# Patient Record
Sex: Male | Born: 1960 | Race: Black or African American | Hispanic: No | Marital: Single | Smoking: Current some day smoker
Health system: Southern US, Community
[De-identification: ages and names within clinical notes are randomized; demographics above are authoritative.]

## PROBLEM LIST (undated history)

## (undated) DIAGNOSIS — F250 Schizoaffective disorder, bipolar type: Secondary | ICD-10-CM

## (undated) DIAGNOSIS — F209 Schizophrenia, unspecified: Secondary | ICD-10-CM

## (undated) DIAGNOSIS — I10 Essential (primary) hypertension: Secondary | ICD-10-CM

## (undated) HISTORY — PX: OTHER SURGICAL HISTORY: SHX169

---

## 2010-06-21 ENCOUNTER — Emergency Department: Payer: Self-pay | Admitting: Emergency Medicine

## 2010-07-15 ENCOUNTER — Emergency Department: Payer: Self-pay | Admitting: *Deleted

## 2011-05-31 ENCOUNTER — Emergency Department: Payer: Self-pay | Admitting: Emergency Medicine

## 2011-05-31 LAB — DRUG SCREEN, URINE
Benzodiazepine, Ur Scrn: NEGATIVE (ref ?–200)
Cocaine Metabolite,Ur ~~LOC~~: NEGATIVE (ref ?–300)
Methadone, Ur Screen: NEGATIVE (ref ?–300)
Opiate, Ur Screen: NEGATIVE (ref ?–300)
Phencyclidine (PCP) Ur S: NEGATIVE (ref ?–25)
Tricyclic, Ur Screen: NEGATIVE (ref ?–1000)

## 2011-05-31 LAB — COMPREHENSIVE METABOLIC PANEL
Alkaline Phosphatase: 100 U/L (ref 50–136)
Anion Gap: 7 (ref 7–16)
BUN: 16 mg/dL (ref 7–18)
Bilirubin,Total: 0.2 mg/dL (ref 0.2–1.0)
Calcium, Total: 8.6 mg/dL (ref 8.5–10.1)
Chloride: 110 mmol/L — ABNORMAL HIGH (ref 98–107)
Creatinine: 1.08 mg/dL (ref 0.60–1.30)
EGFR (African American): >60
Potassium: 3.6 mmol/L (ref 3.5–5.1)
SGPT (ALT): 14 U/L
Sodium: 143 mmol/L (ref 136–145)
Total Protein: 7.8 g/dL (ref 6.4–8.2)

## 2011-05-31 LAB — CBC
HCT: 38.5 % — ABNORMAL LOW (ref 40.0–52.0)
MCH: 34.3 pg — ABNORMAL HIGH (ref 26.0–34.0)
MCHC: 34 g/dL (ref 32.0–36.0)
Platelet: 231 10*3/uL (ref 150–440)
RBC: 3.82 10*6/uL — ABNORMAL LOW (ref 4.40–5.90)
RDW: 13.2 % (ref 11.5–14.5)
WBC: 8.3 10*3/uL (ref 3.8–10.6)

## 2011-05-31 LAB — ETHANOL
Ethanol %: <0.003 % (ref 0.000–0.080)
Ethanol: <3 mg/dL

## 2011-05-31 LAB — ACETAMINOPHEN LEVEL: Acetaminophen: <2 ug/mL

## 2011-08-04 ENCOUNTER — Emergency Department: Payer: Self-pay | Admitting: Emergency Medicine

## 2011-08-04 LAB — DRUG SCREEN, URINE
Barbiturates, Ur Screen: NEGATIVE (ref ?–200)
Cocaine Metabolite,Ur ~~LOC~~: NEGATIVE (ref ?–300)
MDMA (Ecstasy)Ur Screen: NEGATIVE (ref ?–500)
Opiate, Ur Screen: NEGATIVE (ref ?–300)
Phencyclidine (PCP) Ur S: NEGATIVE (ref ?–25)
Tricyclic, Ur Screen: NEGATIVE (ref ?–1000)

## 2011-08-04 LAB — COMPREHENSIVE METABOLIC PANEL
Albumin: 3.9 g/dL (ref 3.4–5.0)
Alkaline Phosphatase: 121 U/L (ref 50–136)
BUN: 14 mg/dL (ref 7–18)
Bilirubin,Total: 0.3 mg/dL (ref 0.2–1.0)
Creatinine: 0.94 mg/dL (ref 0.60–1.30)
EGFR (African American): >60
Glucose: 79 mg/dL (ref 65–99)
SGOT(AST): 26 U/L (ref 15–37)
SGPT (ALT): 17 U/L
Total Protein: 8.2 g/dL (ref 6.4–8.2)

## 2011-08-04 LAB — CBC
MCHC: 34.6 g/dL (ref 32.0–36.0)
MCV: 100 fL (ref 80–100)
Platelet: 196 10*3/uL (ref 150–440)
RBC: 4.14 10*6/uL — ABNORMAL LOW (ref 4.40–5.90)
RDW: 13.2 % (ref 11.5–14.5)
WBC: 7.1 10*3/uL (ref 3.8–10.6)

## 2011-08-04 LAB — URINALYSIS, COMPLETE
Bacteria: NONE SEEN
Bilirubin,UR: NEGATIVE
Blood: NEGATIVE
Nitrite: NEGATIVE
RBC,UR: 1 /HPF (ref 0–5)
Specific Gravity: 1.028 (ref 1.003–1.030)
WBC UR: 5 /HPF (ref 0–5)

## 2011-08-04 LAB — ETHANOL
Ethanol %: <0.003 % (ref 0.000–0.080)
Ethanol: <3 mg/dL

## 2011-10-29 ENCOUNTER — Inpatient Hospital Stay: Payer: Self-pay | Admitting: Psychiatry

## 2011-10-29 LAB — COMPREHENSIVE METABOLIC PANEL
Albumin: 4.2 g/dL (ref 3.4–5.0)
Alkaline Phosphatase: 108 U/L (ref 50–136)
Bilirubin,Total: 0.5 mg/dL (ref 0.2–1.0)
Calcium, Total: 8.8 mg/dL (ref 8.5–10.1)
Creatinine: 0.89 mg/dL (ref 0.60–1.30)
EGFR (African American): >60
EGFR (Non-African Amer.): >60
Glucose: 81 mg/dL (ref 65–99)
Osmolality: 277 (ref 275–301)
Potassium: 3.5 mmol/L (ref 3.5–5.1)
SGPT (ALT): 20 U/L (ref 12–78)
Sodium: 139 mmol/L (ref 136–145)

## 2011-10-29 LAB — URINALYSIS, COMPLETE
Bacteria: NONE SEEN
Bilirubin,UR: NEGATIVE
Glucose,UR: NEGATIVE mg/dL (ref 0–75)
Ph: 5 (ref 4.5–8.0)
Protein: NEGATIVE
RBC,UR: 1 /HPF (ref 0–5)
Specific Gravity: 1.028 (ref 1.003–1.030)
Squamous Epithelial: 2
WBC UR: 5 /HPF (ref 0–5)

## 2011-10-29 LAB — CBC
HCT: 42.1 % (ref 40.0–52.0)
HGB: 14.2 g/dL (ref 13.0–18.0)
MCH: 34.2 pg — ABNORMAL HIGH (ref 26.0–34.0)
MCHC: 33.8 g/dL (ref 32.0–36.0)
MCV: 101 fL — ABNORMAL HIGH (ref 80–100)
Platelet: 276 10*3/uL (ref 150–440)
RDW: 13.5 % (ref 11.5–14.5)
WBC: 5.9 10*3/uL (ref 3.8–10.6)

## 2011-10-29 LAB — DRUG SCREEN, URINE
Amphetamines, Ur Screen: NEGATIVE (ref ?–1000)
Barbiturates, Ur Screen: NEGATIVE (ref ?–200)
Cannabinoid 50 Ng, Ur ~~LOC~~: NEGATIVE (ref ?–50)
MDMA (Ecstasy)Ur Screen: NEGATIVE (ref ?–500)
Methadone, Ur Screen: NEGATIVE (ref ?–300)
Opiate, Ur Screen: NEGATIVE (ref ?–300)
Phencyclidine (PCP) Ur S: NEGATIVE (ref ?–25)

## 2011-10-29 LAB — ACETAMINOPHEN LEVEL: Acetaminophen: <2 ug/mL

## 2011-10-29 LAB — TSH: Thyroid Stimulating Horm: 0.68 u[IU]/mL

## 2011-10-30 LAB — LIPID PANEL
Cholesterol: 148 mg/dL (ref 0–200)
Ldl Cholesterol, Calc: 79 mg/dL (ref 0–100)
Triglycerides: 54 mg/dL (ref 0–200)
VLDL Cholesterol, Calc: 11 mg/dL (ref 5–40)

## 2011-11-03 LAB — COMPREHENSIVE METABOLIC PANEL
Albumin: 3.7 g/dL (ref 3.4–5.0)
Anion Gap: 8 (ref 7–16)
BUN: 12 mg/dL (ref 7–18)
Bilirubin,Total: 0.5 mg/dL (ref 0.2–1.0)
Chloride: 107 mmol/L (ref 98–107)
Creatinine: 0.95 mg/dL (ref 0.60–1.30)
EGFR (African American): >60
Glucose: 120 mg/dL — ABNORMAL HIGH (ref 65–99)
Osmolality: 286 (ref 275–301)
Potassium: 4 mmol/L (ref 3.5–5.1)
SGOT(AST): 28 U/L (ref 15–37)
Sodium: 143 mmol/L (ref 136–145)
Total Protein: 7.4 g/dL (ref 6.4–8.2)

## 2011-11-07 LAB — COMPREHENSIVE METABOLIC PANEL
Alkaline Phosphatase: 92 U/L (ref 50–136)
BUN: 10 mg/dL (ref 7–18)
Bilirubin,Total: 0.4 mg/dL (ref 0.2–1.0)
Chloride: 108 mmol/L — ABNORMAL HIGH (ref 98–107)
Creatinine: 1.03 mg/dL (ref 0.60–1.30)
EGFR (African American): >60
EGFR (Non-African Amer.): >60
Glucose: 71 mg/dL (ref 65–99)
Osmolality: 284 (ref 275–301)
SGPT (ALT): 17 U/L (ref 12–78)
Sodium: 144 mmol/L (ref 136–145)
Total Protein: 7 g/dL (ref 6.4–8.2)

## 2011-11-07 LAB — VALPROIC ACID LEVEL: Valproic Acid: 20 ug/mL — ABNORMAL LOW

## 2012-05-09 ENCOUNTER — Emergency Department: Payer: Self-pay | Admitting: Emergency Medicine

## 2012-05-15 ENCOUNTER — Emergency Department: Payer: Self-pay | Admitting: Emergency Medicine

## 2012-06-20 ENCOUNTER — Emergency Department: Payer: Self-pay | Admitting: Emergency Medicine

## 2013-04-05 LAB — URINALYSIS, COMPLETE
BACTERIA: NONE SEEN
BILIRUBIN, UR: NEGATIVE
BLOOD: NEGATIVE
Glucose,UR: NEGATIVE mg/dL (ref 0–75)
Nitrite: NEGATIVE
Ph: 6 (ref 4.5–8.0)
Protein: NEGATIVE
Specific Gravity: 1.026 (ref 1.003–1.030)
Squamous Epithelial: 1
WBC UR: 14 /HPF (ref 0–5)

## 2013-04-05 LAB — CBC
HCT: 43.7 % (ref 40.0–52.0)
HGB: 14.1 g/dL (ref 13.0–18.0)
MCH: 32.3 pg (ref 26.0–34.0)
MCHC: 32.2 g/dL (ref 32.0–36.0)
MCV: 100 fL (ref 80–100)
Platelet: 229 10*3/uL (ref 150–440)
RBC: 4.37 10*6/uL — AB (ref 4.40–5.90)
RDW: 13.4 % (ref 11.5–14.5)
WBC: 7.1 10*3/uL (ref 3.8–10.6)

## 2013-04-05 LAB — DRUG SCREEN, URINE

## 2013-04-05 LAB — COMPREHENSIVE METABOLIC PANEL
ALBUMIN: 3.8 g/dL (ref 3.4–5.0)
ALK PHOS: 80 U/L
AST: 17 U/L (ref 15–37)
Anion Gap: 6 — ABNORMAL LOW (ref 7–16)
BILIRUBIN TOTAL: 0.3 mg/dL (ref 0.2–1.0)
BUN: 15 mg/dL (ref 7–18)
CO2: 27 mmol/L (ref 21–32)
CREATININE: 1.09 mg/dL (ref 0.60–1.30)
Calcium, Total: 9.2 mg/dL (ref 8.5–10.1)
Chloride: 106 mmol/L (ref 98–107)
EGFR (African American): >60
EGFR (Non-African Amer.): >60
Glucose: 64 mg/dL — ABNORMAL LOW (ref 65–99)
OSMOLALITY: 276 (ref 275–301)
POTASSIUM: 4 mmol/L (ref 3.5–5.1)
SGPT (ALT): 15 U/L (ref 12–78)
Sodium: 139 mmol/L (ref 136–145)
Total Protein: 8.6 g/dL — ABNORMAL HIGH (ref 6.4–8.2)

## 2013-04-05 LAB — SALICYLATE LEVEL: Salicylates, Serum: 2.7 mg/dL

## 2013-04-05 LAB — ACETAMINOPHEN LEVEL

## 2013-04-05 LAB — VALPROIC ACID LEVEL: Valproic Acid: 33 ug/mL — ABNORMAL LOW

## 2013-04-05 LAB — ETHANOL: Ethanol %: <0.003 % (ref 0.000–0.080)

## 2013-04-06 ENCOUNTER — Inpatient Hospital Stay: Payer: Self-pay | Admitting: Psychiatry

## 2013-04-06 LAB — TSH: Thyroid Stimulating Horm: 0.379 u[IU]/mL — ABNORMAL LOW

## 2014-01-11 DIAGNOSIS — F209 Schizophrenia, unspecified: Secondary | ICD-10-CM | POA: Diagnosis not present
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
## 2014-04-29 NOTE — Consult Note (Signed)
Brief Consult Note: Diagnosis: Schizoaffective disorder, bipolar type.   Patient was seen by consultant.   Recommend further assessment or treatment.   Orders entered.   Comments: Chronic spmi, recently went from group home to apartment. Now w/ disruptive and agressive behaviors.Sent over from [facility] where he was seen by this Clinical research associatewriter, on IVC. Hx of > 50 psych hosps since age 54. Poor med compliance suspected. I will provide med orders, labs and follow up tomorrow, provide psych services until he is admitted..  Electronic Signatures: Corinna LinesLavine, Philip H (MD)  (Signed 31-Mar-15 17:19)  Authored: Brief Consult Note   Last Updated: 31-Mar-15 17:19 by Corinna LinesLavine, Philip H (MD)

## 2014-04-29 NOTE — H&P (Signed)
PATIENT NAME:  [name], CIFELLI MR#:  [id number] DATE OF BIRTH:  07/17/1960  DATE OF ADMISSION:  04/06/2013  IDENTIFYING INFORMATION AND CHIEF COMPLAINT: A 54 year old man with a history of schizoaffective disorder, who is sent here from [facility] because of agitated behavior. The patient's chief complaint "I'd like to know when I can be discharged."   HISTORY OF PRESENT ILLNESS: Information obtained from the patient and the chart and from a brief discussion with the psychiatrist who referred the patient here. The patient evidently was confronted by the supervisor at his apartment complex over the fact that he has been playing music loudly at night, pacing around, being loud and disruptive of other residents. Allegedly, the patient became agitated and was yelling this apartment supervisor. The ACT team was notified and brought the patient to [facility] where it sounds like he probably escalated more and was agitated. The patient himself does not deny this, but he tends to minimize the tone of it. He says that he does admit that he is frequently awake at 2:00 or 3:00 in the morning and that sometimes he does play music loudly and pace around. He says he has no wish to disrupt anyone else and admits that they had confronted him about it at least once previously as well. He denies that he was hostile but does admit raising his voice to the person at the apartment. The patient denies that his mood has been abnormally high. He denies any hallucinations. Denies any homicidal ideation. Denies that he has been feeling paranoid. He claims that he has been taking all of his medication as prescribed by the ACT team. His explanation for being awake in the early morning hours is that the ACT team brings him his medication around 4:00 to 5:00 every afternoon. He says that he takes it right and his causes him to fall asleep about 6:00 in the evening. He then wakes up at about 2:00 after close to eight hours of  sleep and is awake for the rest of the night. He says this has been his habit for a while since the ACT team is giving him his medicine. He denies that he has been abusing any alcohol or drugs.   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 Long history of mental illness, most recent diagnosis of schizoaffective sounds like agitated behavior often accompanied (Dictation Anomaly)<< MISSING TEXT>>  symptoms has been calm and in the past. When he was here in 2013, he was very paranoid and psychotic. He has responded to medication, but has a history of repeated noncompliance with medicine. No known history of suicide attempts.   FAMILY HISTORY: He denies any family history of mental illness.   PAST MEDICAL HISTORY: Has high blood pressure otherwise, denies significant medical problems.   SOCIAL HISTORY: He is currently living in a supervised apartment situation. The ACT team  checks up on him daily. It was fairly recently that he moved from a group home into this living situation. The patient does not have supportive family in the area. He tells me that he is planning to move to [place], [place] to live on his own because that is where he is originally from. He talks about it as though this were an imminent change, but when I pin him down about it he says that maybe it will happen next December.   SUBSTANCE ABUSE HISTORY: As far as I can tell from the old records and  what the patient says he has never really had a substance abuse issue.   CURRENT MEDICATIONS: Outpatient he was taking 1500 mg of valproic acid as the liquid form every day, Zyprexa 15 mg at night, also gets [place] injection, according to the notes here it is 1.5 mL of the 234 mg preparation and that was done within the last week. Also takes Cogentin 0.5 mg twice a day, lisinopril 5 mg per day. He is no longer taking antibiotics that are listed here, those  were for a tooth infection that is resolved.   ALLERGIES: HALDOL.   MENTAL STATUS EXAMINATION: Slightly disheveled gentleman, but not severely so.  Looks his stated age. He was a little intrusive about talking with me, but then he was cooperative and polite during the conversation. Good affect, not agitated or hostile. Eye contact good. Psychomotor activity normal. Speech normal rate, tone and volume. Not pressured, not loud. Thoughts were generally organized, He seems to at times be a little bit minimizing and evasive but not in a way that struck me as psychotic. Denies hallucinations. Denies suicidal or homicidal ideation. Insight and judgment seem to be adequate right now. Short-term and long-term memory both intact to testing. Normal fund of knowledge. Normal intelligence. Alert and oriented x 4.   PHYSICAL EXAMINATION: GENERAL: The patient appears to be in no physical distress.  SKIN: No skin wounds or lesions identified.  HEENT: Pupils equal and reactive. Face symmetric. He has poor dentition, but nothing that appears acute. Oral mucosa normal.  NECK AND BACK: Nontender.  MUSCULOSKELETAL: Full range of motion at all extremities. Strength and reflexes normal and symmetric throughout. Normal gait.   NEUROLOGIC: Cranial nerves symmetric and normal.  LUNGS: Clear without wheezes.  HEART: Regular rate and rhythm.  ABDOMEN: Soft, nontender, normal bowel sounds.  VITAL SIGNS: Currently temperature 98.4, pulse 81, respirations 20, blood pressure 129/84.   LABORATORY RESULTS: His drug screen is negative. His chemistry panel shows a low glucose at 64, otherwise pretty unremarkable. Slightly elevated total protein 8.6. Alcohol level negative. Valproic acid level done yesterday afternoon was 33, below the therapeutic level. TSH not yet done. Urinalysis actually shows significant white blood cells, trace leukocyte esterase, no blood.   ASSESSMENT: A 54 year old man with schizoaffective disorder,  brought into the hospital because he was reportedly agitated at his new living situation. To my interview and examination today he does not appear to be psychotic or obviously manic. There is no evidence of substance abuse. His valproic acid level is low which suggests that he is taking some of it, perhaps not being regular as he says. It is possible that he is just pulling it together right now and still is in a disruptive, manic phase. It is also possible that perhaps this living situation is not working out as well as he had hoped.   TREATMENT PLAN: Continue the Depakote as a liquid. Continue the Zyprexa at the 20 mg dose that was ordered by Dr. Lenis Noon. Review labs studies. Engage him in group and individual therapy. We will try and get in touch with the ACT team and see their opinion of him, see if they want to meet him. I suggested to him that he should cooperate just because he does not want to get thrown out of this new apartment situation and he agrees with that.   DIAGNOSIS, PRINCIPAL AND PRIMARY:  AXIS I: Schizoaffective disorder, bipolar type, hypomanic.   SECONDARY DIAGNOSES: AXIS I: No further.  AXIS II:  No diagnosis.  AXIS III: High blood pressure.  AXIS IV: Moderate to severe from new living situation.  AXIS V: Functioning at time of evaluation: 35.   ____________________________ Audery AmelJohn T. Clapacs, MD jtc:sg D: 04/06/2013 14:29:40 ET T: 04/06/2013 15:34:25 ET JOB#: [id number]  cc: Audery AmelJohn T. Clapacs, MD, <Dictator> Audery AmelJOHN T CLAPACS MD ELECTRONICALLY SIGNED 04/06/2013 23:37

## 2014-04-30 NOTE — Consult Note (Signed)
Brief Consult Note: Diagnosis: Schizoaffective disorder.   Patient was seen by consultant.   Consult note dictated.   Recommend further assessment or treatment.   Orders entered.   Discussed with Attending MD.   Comments: [name] has a long h/o mental illness. He became agitated and threatening to the staff at the group home inthe context of treatment noncompliance. He was restarted on his medications and his dosage was adjusted. tegretol was added for mood stbilization. He tolerates medications well. He was evaluated by his [name] nurse who feels that he is at his baselione.   PLAN: 1. The patient no longer meets criteria for IVC. I will terminate proceedings please discharge as appropriate.   2. Psychosis. He is to continue Zyprexa Zydis to improve compliance, Navane and TanzaniaInvega Sustenna monthly injections.  I recommend that his dose of [name] is increased to 234 mg/month at his next injection on 6/17. This could potentially eliminate other antipsychotics. The patient is currently on 3 antipsychotics but there is no evidence that such polypharmacy improves outcomes.   3. Tegretol was added for mood stabilization.   4. Rx were given.  5. He will follow up with Dr. Estill [name] and [name] tomorrow.  6. [name] from his group home will pick him up at noon..  Electronic Signatures: [name], [name] (MD)  (Signed [phone]-May-13 13:16)  Authored: Brief Consult Note   Last Updated: 29-May-13 13:16 by [name], [name] (MD)

## 2014-04-30 NOTE — Consult Note (Signed)
PATIENT NAME:  [name], [name] MR#:  [id number] DATE OF BIRTH:  04-25-1960  DATE OF CONSULTATION:  06/04/2011  REFERRING PHYSICIAN:  Belva Bertin, MD   CONSULTING PHYSICIAN:  Josephyne Tarter B. Abdoulaye Drum, MD  REASON FOR CONSULTATION: To evaluate psychotic patient.   IDENTIFYING DATA: Mr. Straus is a 54 year old male with history of schizoaffective disorder.   CHIEF COMPLAINT: "I am ready to go home."   HISTORY OF PRESENT ILLNESS: [place] discharge from [facility]. He has been in the care of Dr. Lesly Rubenstein of  [facility]. He has been fairly stable on his medications. However, a month or so ago, Dr. Lesly Rubenstein decided to switch him to injectable Kirt Boys. The patient stopped taking oral medications. Apparently, the Group Home and the ACT Team were aware of his noncompliance. He gradually deteriorated. On the day of admission, he was floridly psychotic, paranoid, delusional, attending to internal stimuli, argumentative, threatening to staff and peers. The patient admits to treatment noncompliance but also indicated that one of the residents of the group home was just discharged from [facility] and was rather agitated at the home. The patient was restarted on his medications by Dr. Franchot Mimes on the 25th. I met with him yesterday on the 28th and today. He is cool and collected. He is no longer paranoid or delusional. He denies auditory or visual hallucinations. He has been compliant with medications here including Zyprexa Zydis and Navane.  The patient has been maintained on three antipsychotics by his primary psychiatrist. This includes Zyprexa 25 mg daily, Navane 2 mg at 5:00 in the afternoon-most likely to control his behavior, and [place] injection 156 mg every 4 weeks. His last injection was given on May 20th. The patient denies alcohol, or illicit drug or prescription pill abuse. He denies excessive anxiety or symptoms  suggestive of bipolar mania. He is okay taking medication in the hospital and at least verbally agrees to continue on medications as directed by Dr. Lesly Rubenstein.   PAST PSYCHIATRIC HISTORY: Reportedly, the patient has between 68 and 50 inpatient psychiatric hospitalizations, mostly at the [place] facility. He has not been hospitalized since arriving in [place] in June of 2012. He has been tried on numerous medications. There is a history of medication noncompliance. He reports that he did well on lithium and Depakote. He is  reportedly allergic to Haldol, although it is uncertain. He was given the diagnosis of bipolar disorder, schizophrenia and schizoaffective disorder.   FAMILY PSYCHIATRIC HISTORY: None reported.   PAST MEDICAL HISTORY: None reported.   ALLERGIES: Haldol.   MEDICATIONS ON ADMISSION:  1. Ativan 1 mg at bedtime.  2. Multivitamin daily. 3. Ibuprofen 800 mg every 8 hours. 4. Zyprexa 25 mg at night. 5. Navane 2 mg at 5:00 in the afternoon. 6. Kirt Boys injection 156 mg every month, last injection given on May 20th.   SOCIAL HISTORY: He is originally from [facility] [place] [facility]. It is unclear whether the patient has a guardian. He believes that he does, but we found no evidence of it. He has been a resident of [place]. He has a history of multiple group home placements and failings. There is a history of threats and mild violence. He is being followed by [facility].   REVIEW OF SYSTEMS: CONSTITUTIONAL: No fevers or chills. No weight changes. EYES: No double or blurred vision. ENT: No hearing loss. RESPIRATORY: No shortness of breath or  cough. CARDIOVASCULAR: No chest pain or orthopnea. GASTROINTESTINAL: No abdominal pain, nausea, vomiting, or diarrhea. GU: No incontinence or frequency. ENDOCRINE: No heat or cold intolerance. LYMPHATIC: No anemia or easy bruising. INTEGUMENTARY: No acne or rash. MUSCULOSKELETAL: No muscle or joint pain.  NEUROLOGIC: No tingling or weakness. PSYCHIATRIC: See history of present illness for details.   PHYSICAL EXAMINATION:  VITAL SIGNS: Blood pressure 135/63, pulse 49, respirations 18, temperature 96.3.   GENERAL: This is a well-developed male in no acute distress. The rest of the physical examination is deferred to his primary attending.    LABORATORY, DIAGNOSTIC AND RADIOLOGICAL DATA:  Chemistries are within normal limits except for blood glucose of 134.  Blood alcohol level is zero.  LFTs are within normal limits.  TSH is 0.83.  Urine toxicology screen is negative for substances.  CBC: White blood count 8.3, hemoglobin 13.1, hematocrit 38.5, platelets 231, MCV 101.  Serum acetaminophen and salicylates are low.   MENTAL STATUS EXAMINATION: The patient is alert and oriented to person, place, time and situation. He is pleasant, polite, and cooperative. He is cool and collected. He is wearing hospital scrubs. He maintains good eye contact. His speech is of normal rhythm, rate, and volume. Mood is fine with full affect. Thought processing is logical and goal oriented. Thought content: He denies suicidal or homicidal ideation. There are no delusions or paranoia. There are no auditory or visual hallucinations. His cognition is grossly intact. He registers three out of three and recalls three out of three objects after five minutes. He can spell world forwards and backwards. He knows the current president. His insight and judgment have improved.   SUICIDE RISK ASSESSMENT: This is a patient with a long history of difficult to treat mental illness with psychosis and mood instability who came to the Emergency Room floridly psychotic, agitated and threatening in the context of treatment noncompliance  for four weeks. He was restarted on medications, tolerated them well, and feels ready to return to his group home. He was evaluated by an ACT Team nurse and found to be at his baseline.   DIAGNOSES:  AXIS I:  Schizoaffective disorder, bipolar type.   AXIS II: Deferred.   AXIS III: None.   AXIS IV: Mental illness, primary support, poor coping skills.   AXIS V: Global assessment of functioning 45.   PLAN:  1. The patient no longer meets criteria for involuntary inpatient psychiatric commitment. I will terminate proceedings. Please discharge as appropriate.  2. Mood/psychosis: The patient is to continue Zyprexa Zydis instead of Zyprexa to improve compliance. He is to take 10 mg in the morning, 15 mg at night. He is to continue Navane at an increased dose of 5 mg at 5:00 in the afternoon as directed by his primary psychiatrist. He is also to continue on [place] monthly injections. I recommend that his dose of Kirt Boys is increased to 234 mg a month at his next injection on June 17th. The patient is currently on three antipsychotics, and there is no evidence that such polypharmacy improves outcomes. Hopefully increasing the dose of Invega could possibly eliminate other antipsychotics. We also added Tegretol 200 mg twice daily for mood stabilization as the patient did well on Depakote and lithium in the past. The patient agreed with introducing additional medication.  3. Prescriptions were given.  4. He will follow up with Dr. Lesly Rubenstein and [facility] tomorrow.  5. Sandie Ano, from his group home, will pick him up noon.  ____________________________ Wardell Honour Bary Leriche, MD jbp:cbb D: 06/04/2011 13:15:41 ET T: 06/04/2011 13:39:47 ET JOB#: [id number]  cc: Qiara Minetti B. Bary Leriche, MD, <Dictator> Clovis Fredrickson MD ELECTRONICALLY SIGNED 06/10/2011 21:59

## 2014-04-30 NOTE — Consult Note (Signed)
Brief Consult Note: Diagnosis: Schizoaffective disorder.   Patient was seen by consultant.   Consult note dictated.   Recommend further assessment or treatment.   Orders entered.   Discussed with Attending MD.   Comments: [name] has a long h/o mental illness. He became agitated and threatening to the staff at the group home inthe context of treatment noncompliance.   PLAN: 1. The patient was restarted on Zyprexa zydis and Navane.   2. He received his monthly Invega suatenna injection of 156 mg on 5/20.  3. I would suggest increasing his dose of Invega to 234 mg/month and adding a mood stabilizer. I will start tegretol. This could potentially eliminate other antipsychotics.   4. ACT team and group home owner to visit with the patient today to decide if he is at bhis baseline.  Electronic Signatures: [name], Aunna Snooks (MD)  (Signed 28-May-13 11:47)  Authored: Brief Consult Note   Last Updated: 28-May-13 11:47 by [name], Lenita Peregrina (MD)

## 2014-10-29 DIAGNOSIS — Z79899 Other long term (current) drug therapy: Secondary | ICD-10-CM | POA: Diagnosis not present

## 2014-10-29 DIAGNOSIS — R400 Somnolence: Secondary | ICD-10-CM | POA: Diagnosis not present

## 2014-10-29 DIAGNOSIS — Z888 Allergy status to other drugs, medicaments and biological substances status: Secondary | ICD-10-CM | POA: Diagnosis not present

## 2014-10-29 DIAGNOSIS — F209 Schizophrenia, unspecified: Secondary | ICD-10-CM | POA: Diagnosis not present

## 2014-10-30 DIAGNOSIS — R400 Somnolence: Secondary | ICD-10-CM | POA: Diagnosis not present

## 2014-11-13 DIAGNOSIS — F209 Schizophrenia, unspecified: Secondary | ICD-10-CM | POA: Diagnosis not present

## 2014-11-13 DIAGNOSIS — Z79899 Other long term (current) drug therapy: Secondary | ICD-10-CM | POA: Diagnosis not present

## 2014-11-13 DIAGNOSIS — R45850 Homicidal ideations: Secondary | ICD-10-CM | POA: Diagnosis not present

## 2014-11-28 DIAGNOSIS — F23 Brief psychotic disorder: Secondary | ICD-10-CM | POA: Diagnosis not present

## 2014-11-28 DIAGNOSIS — Z79899 Other long term (current) drug therapy: Secondary | ICD-10-CM | POA: Diagnosis not present

## 2014-12-08 DIAGNOSIS — Z79899 Other long term (current) drug therapy: Secondary | ICD-10-CM | POA: Diagnosis not present

## 2014-12-08 DIAGNOSIS — F209 Schizophrenia, unspecified: Secondary | ICD-10-CM | POA: Diagnosis not present

## 2014-12-08 DIAGNOSIS — F23 Brief psychotic disorder: Secondary | ICD-10-CM | POA: Diagnosis not present

## 2014-12-08 DIAGNOSIS — Z888 Allergy status to other drugs, medicaments and biological substances status: Secondary | ICD-10-CM | POA: Diagnosis not present

## 2015-01-12 DIAGNOSIS — R451 Restlessness and agitation: Secondary | ICD-10-CM | POA: Diagnosis not present

## 2015-01-12 DIAGNOSIS — F209 Schizophrenia, unspecified: Secondary | ICD-10-CM | POA: Diagnosis not present

## 2015-01-12 DIAGNOSIS — Z79899 Other long term (current) drug therapy: Secondary | ICD-10-CM | POA: Diagnosis not present

## 2015-01-27 DIAGNOSIS — F250 Schizoaffective disorder, bipolar type: Secondary | ICD-10-CM | POA: Diagnosis not present

## 2015-01-28 DIAGNOSIS — F250 Schizoaffective disorder, bipolar type: Secondary | ICD-10-CM | POA: Diagnosis not present

## 2015-02-03 DIAGNOSIS — F250 Schizoaffective disorder, bipolar type: Secondary | ICD-10-CM | POA: Diagnosis not present

## 2015-02-04 DIAGNOSIS — F250 Schizoaffective disorder, bipolar type: Secondary | ICD-10-CM | POA: Diagnosis not present

## 2015-02-13 DIAGNOSIS — J9809 Other diseases of bronchus, not elsewhere classified: Secondary | ICD-10-CM | POA: Diagnosis not present

## 2015-02-13 DIAGNOSIS — R451 Restlessness and agitation: Secondary | ICD-10-CM | POA: Diagnosis not present

## 2015-02-14 DIAGNOSIS — J9809 Other diseases of bronchus, not elsewhere classified: Secondary | ICD-10-CM | POA: Diagnosis not present

## 2015-03-13 DIAGNOSIS — F259 Schizoaffective disorder, unspecified: Secondary | ICD-10-CM | POA: Diagnosis not present

## 2015-03-13 DIAGNOSIS — F250 Schizoaffective disorder, bipolar type: Secondary | ICD-10-CM | POA: Diagnosis not present

## 2015-03-13 DIAGNOSIS — F17210 Nicotine dependence, cigarettes, uncomplicated: Secondary | ICD-10-CM | POA: Diagnosis not present

## 2015-03-13 DIAGNOSIS — R45850 Homicidal ideations: Secondary | ICD-10-CM | POA: Diagnosis not present

## 2015-03-13 DIAGNOSIS — Z9114 Patient's other noncompliance with medication regimen: Secondary | ICD-10-CM | POA: Diagnosis not present

## 2015-03-13 DIAGNOSIS — F209 Schizophrenia, unspecified: Secondary | ICD-10-CM | POA: Diagnosis not present

## 2015-03-28 DIAGNOSIS — F250 Schizoaffective disorder, bipolar type: Secondary | ICD-10-CM | POA: Diagnosis not present

## 2015-03-28 DIAGNOSIS — E119 Type 2 diabetes mellitus without complications: Secondary | ICD-10-CM | POA: Diagnosis not present

## 2015-03-28 DIAGNOSIS — Z91128 Patient's intentional underdosing of medication regimen for other reason: Secondary | ICD-10-CM | POA: Diagnosis not present

## 2015-03-28 DIAGNOSIS — T383X6A Underdosing of insulin and oral hypoglycemic [antidiabetic] drugs, initial encounter: Secondary | ICD-10-CM | POA: Diagnosis not present

## 2015-04-02 DIAGNOSIS — R451 Restlessness and agitation: Secondary | ICD-10-CM | POA: Diagnosis not present

## 2015-04-02 DIAGNOSIS — F209 Schizophrenia, unspecified: Secondary | ICD-10-CM | POA: Diagnosis not present

## 2015-04-17 DIAGNOSIS — F17200 Nicotine dependence, unspecified, uncomplicated: Secondary | ICD-10-CM | POA: Diagnosis not present

## 2015-04-17 DIAGNOSIS — F29 Unspecified psychosis not due to a substance or known physiological condition: Secondary | ICD-10-CM | POA: Diagnosis not present

## 2015-04-17 DIAGNOSIS — F250 Schizoaffective disorder, bipolar type: Secondary | ICD-10-CM | POA: Diagnosis not present

## 2015-04-17 DIAGNOSIS — R451 Restlessness and agitation: Secondary | ICD-10-CM | POA: Diagnosis not present

## 2015-04-17 DIAGNOSIS — Z9114 Patient's other noncompliance with medication regimen: Secondary | ICD-10-CM | POA: Diagnosis not present

## 2015-04-17 DIAGNOSIS — Z9119 Patient's noncompliance with other medical treatment and regimen: Secondary | ICD-10-CM | POA: Diagnosis not present

## 2015-05-01 DIAGNOSIS — F209 Schizophrenia, unspecified: Secondary | ICD-10-CM | POA: Diagnosis not present

## 2015-05-01 DIAGNOSIS — Z9114 Patient's other noncompliance with medication regimen: Secondary | ICD-10-CM | POA: Diagnosis not present

## 2015-05-01 DIAGNOSIS — F17210 Nicotine dependence, cigarettes, uncomplicated: Secondary | ICD-10-CM | POA: Diagnosis not present

## 2015-05-06 DIAGNOSIS — F250 Schizoaffective disorder, bipolar type: Secondary | ICD-10-CM | POA: Diagnosis not present

## 2015-05-06 DIAGNOSIS — Z79899 Other long term (current) drug therapy: Secondary | ICD-10-CM | POA: Diagnosis not present

## 2015-05-24 DIAGNOSIS — F17200 Nicotine dependence, unspecified, uncomplicated: Secondary | ICD-10-CM | POA: Diagnosis not present

## 2015-05-24 DIAGNOSIS — F23 Brief psychotic disorder: Secondary | ICD-10-CM | POA: Diagnosis not present

## 2015-05-24 DIAGNOSIS — R451 Restlessness and agitation: Secondary | ICD-10-CM | POA: Diagnosis not present

## 2015-05-24 DIAGNOSIS — F259 Schizoaffective disorder, unspecified: Secondary | ICD-10-CM | POA: Diagnosis not present

## 2015-06-08 DIAGNOSIS — F250 Schizoaffective disorder, bipolar type: Secondary | ICD-10-CM | POA: Diagnosis not present

## 2015-06-08 DIAGNOSIS — R45851 Suicidal ideations: Secondary | ICD-10-CM | POA: Diagnosis not present

## 2015-06-08 DIAGNOSIS — F17200 Nicotine dependence, unspecified, uncomplicated: Secondary | ICD-10-CM | POA: Diagnosis not present

## 2015-06-08 DIAGNOSIS — F209 Schizophrenia, unspecified: Secondary | ICD-10-CM | POA: Diagnosis not present

## 2015-06-08 DIAGNOSIS — Z79899 Other long term (current) drug therapy: Secondary | ICD-10-CM | POA: Diagnosis not present

## 2015-06-16 DIAGNOSIS — F200 Paranoid schizophrenia: Secondary | ICD-10-CM | POA: Diagnosis not present

## 2015-06-17 DIAGNOSIS — F200 Paranoid schizophrenia: Secondary | ICD-10-CM | POA: Diagnosis not present

## 2015-06-19 DIAGNOSIS — F200 Paranoid schizophrenia: Secondary | ICD-10-CM | POA: Diagnosis not present

## 2015-06-20 DIAGNOSIS — F200 Paranoid schizophrenia: Secondary | ICD-10-CM | POA: Diagnosis not present

## 2015-06-21 DIAGNOSIS — F200 Paranoid schizophrenia: Secondary | ICD-10-CM | POA: Diagnosis not present

## 2015-06-22 DIAGNOSIS — F200 Paranoid schizophrenia: Secondary | ICD-10-CM | POA: Diagnosis not present

## 2015-06-23 DIAGNOSIS — F200 Paranoid schizophrenia: Secondary | ICD-10-CM | POA: Diagnosis not present

## 2015-06-24 DIAGNOSIS — F200 Paranoid schizophrenia: Secondary | ICD-10-CM | POA: Diagnosis not present

## 2015-06-25 DIAGNOSIS — F200 Paranoid schizophrenia: Secondary | ICD-10-CM | POA: Diagnosis not present

## 2015-07-06 DIAGNOSIS — F209 Schizophrenia, unspecified: Secondary | ICD-10-CM | POA: Diagnosis not present

## 2015-07-06 DIAGNOSIS — F17200 Nicotine dependence, unspecified, uncomplicated: Secondary | ICD-10-CM | POA: Diagnosis not present

## 2015-07-06 DIAGNOSIS — F23 Brief psychotic disorder: Secondary | ICD-10-CM | POA: Diagnosis not present

## 2015-07-06 DIAGNOSIS — F250 Schizoaffective disorder, bipolar type: Secondary | ICD-10-CM | POA: Diagnosis not present

## 2015-07-06 DIAGNOSIS — Z79899 Other long term (current) drug therapy: Secondary | ICD-10-CM | POA: Diagnosis not present

## 2015-07-19 DIAGNOSIS — Z9114 Patient's other noncompliance with medication regimen: Secondary | ICD-10-CM | POA: Diagnosis not present

## 2015-07-19 DIAGNOSIS — R443 Hallucinations, unspecified: Secondary | ICD-10-CM | POA: Diagnosis not present

## 2015-07-19 DIAGNOSIS — R451 Restlessness and agitation: Secondary | ICD-10-CM | POA: Diagnosis not present

## 2015-07-19 DIAGNOSIS — Z79899 Other long term (current) drug therapy: Secondary | ICD-10-CM | POA: Diagnosis not present

## 2015-07-19 DIAGNOSIS — F17200 Nicotine dependence, unspecified, uncomplicated: Secondary | ICD-10-CM | POA: Diagnosis not present

## 2015-08-24 DIAGNOSIS — N390 Urinary tract infection, site not specified: Secondary | ICD-10-CM | POA: Diagnosis not present

## 2015-08-25 DIAGNOSIS — N390 Urinary tract infection, site not specified: Secondary | ICD-10-CM | POA: Diagnosis not present

## 2015-08-26 DIAGNOSIS — N390 Urinary tract infection, site not specified: Secondary | ICD-10-CM | POA: Diagnosis not present

## 2015-08-27 DIAGNOSIS — N390 Urinary tract infection, site not specified: Secondary | ICD-10-CM | POA: Diagnosis not present

## 2015-08-28 DIAGNOSIS — N390 Urinary tract infection, site not specified: Secondary | ICD-10-CM | POA: Diagnosis not present

## 2015-08-29 DIAGNOSIS — N390 Urinary tract infection, site not specified: Secondary | ICD-10-CM | POA: Diagnosis not present

## 2015-08-30 DIAGNOSIS — R946 Abnormal results of thyroid function studies: Secondary | ICD-10-CM | POA: Diagnosis not present

## 2015-08-30 DIAGNOSIS — N390 Urinary tract infection, site not specified: Secondary | ICD-10-CM | POA: Diagnosis not present

## 2015-09-01 DIAGNOSIS — N390 Urinary tract infection, site not specified: Secondary | ICD-10-CM | POA: Diagnosis not present

## 2015-09-01 DIAGNOSIS — R946 Abnormal results of thyroid function studies: Secondary | ICD-10-CM | POA: Diagnosis not present

## 2015-09-02 DIAGNOSIS — N390 Urinary tract infection, site not specified: Secondary | ICD-10-CM | POA: Diagnosis not present

## 2015-09-02 DIAGNOSIS — R946 Abnormal results of thyroid function studies: Secondary | ICD-10-CM | POA: Diagnosis not present

## 2015-09-03 DIAGNOSIS — N390 Urinary tract infection, site not specified: Secondary | ICD-10-CM | POA: Diagnosis not present

## 2015-09-03 DIAGNOSIS — R946 Abnormal results of thyroid function studies: Secondary | ICD-10-CM | POA: Diagnosis not present

## 2015-09-04 DIAGNOSIS — N390 Urinary tract infection, site not specified: Secondary | ICD-10-CM | POA: Diagnosis not present

## 2015-09-04 DIAGNOSIS — R946 Abnormal results of thyroid function studies: Secondary | ICD-10-CM | POA: Diagnosis not present

## 2015-09-05 DIAGNOSIS — N390 Urinary tract infection, site not specified: Secondary | ICD-10-CM | POA: Diagnosis not present

## 2015-09-06 DIAGNOSIS — N390 Urinary tract infection, site not specified: Secondary | ICD-10-CM | POA: Diagnosis not present

## 2015-09-07 DIAGNOSIS — N390 Urinary tract infection, site not specified: Secondary | ICD-10-CM | POA: Diagnosis not present

## 2015-09-08 DIAGNOSIS — N390 Urinary tract infection, site not specified: Secondary | ICD-10-CM | POA: Diagnosis not present

## 2015-09-09 DIAGNOSIS — N390 Urinary tract infection, site not specified: Secondary | ICD-10-CM | POA: Diagnosis not present

## 2015-09-10 DIAGNOSIS — N390 Urinary tract infection, site not specified: Secondary | ICD-10-CM | POA: Diagnosis not present

## 2015-09-11 DIAGNOSIS — N390 Urinary tract infection, site not specified: Secondary | ICD-10-CM | POA: Diagnosis not present

## 2015-09-12 DIAGNOSIS — N390 Urinary tract infection, site not specified: Secondary | ICD-10-CM | POA: Diagnosis not present

## 2015-09-13 DIAGNOSIS — N390 Urinary tract infection, site not specified: Secondary | ICD-10-CM | POA: Diagnosis not present

## 2015-09-14 DIAGNOSIS — N390 Urinary tract infection, site not specified: Secondary | ICD-10-CM | POA: Diagnosis not present

## 2015-09-15 DIAGNOSIS — N390 Urinary tract infection, site not specified: Secondary | ICD-10-CM | POA: Diagnosis not present

## 2015-09-16 DIAGNOSIS — N390 Urinary tract infection, site not specified: Secondary | ICD-10-CM | POA: Diagnosis not present

## 2015-09-17 DIAGNOSIS — N390 Urinary tract infection, site not specified: Secondary | ICD-10-CM | POA: Diagnosis not present

## 2015-10-02 ENCOUNTER — Emergency Department
Admission: EM | Admit: 2015-10-02 | Discharge: 2015-10-03 | Disposition: A | Discharge status: Other Acute Inpatient Hospital | Payer: Medicare Other | Attending: Emergency Medicine | Admitting: Emergency Medicine

## 2015-10-02 ENCOUNTER — Encounter: Payer: Self-pay | Admitting: Medical Oncology

## 2015-10-02 DIAGNOSIS — F209 Schizophrenia, unspecified: Secondary | ICD-10-CM

## 2015-10-02 DIAGNOSIS — Z79899 Other long term (current) drug therapy: Secondary | ICD-10-CM | POA: Insufficient documentation

## 2015-10-02 DIAGNOSIS — F17210 Nicotine dependence, cigarettes, uncomplicated: Secondary | ICD-10-CM | POA: Insufficient documentation

## 2015-10-02 DIAGNOSIS — Z91199 Patient's noncompliance with other medical treatment and regimen due to unspecified reason: Secondary | ICD-10-CM

## 2015-10-02 DIAGNOSIS — Z9119 Patient's noncompliance with other medical treatment and regimen: Secondary | ICD-10-CM

## 2015-10-02 DIAGNOSIS — F203 Undifferentiated schizophrenia: Secondary | ICD-10-CM | POA: Diagnosis not present

## 2015-10-02 DIAGNOSIS — Z046 Encounter for general psychiatric examination, requested by authority: Secondary | ICD-10-CM | POA: Diagnosis present

## 2015-10-02 HISTORY — DX: Schizophrenia, unspecified: F20.9

## 2015-10-02 LAB — COMPREHENSIVE METABOLIC PANEL
ALT: 11 U/L — ABNORMAL LOW (ref 17–63)
AST: 24 U/L (ref 15–41)
Albumin: 3.6 g/dL (ref 3.5–5.0)
Alkaline Phosphatase: 75 U/L (ref 38–126)
Anion gap: 10 (ref 5–15)
BILIRUBIN TOTAL: 0.5 mg/dL (ref 0.3–1.2)
BUN: 11 mg/dL (ref 6–20)
CALCIUM: 8.7 mg/dL — AB (ref 8.9–10.3)
CO2: 24 mmol/L (ref 22–32)
Chloride: 103 mmol/L (ref 101–111)
Creatinine, Ser: 1 mg/dL (ref 0.61–1.24)
GFR calc Af Amer: >60 mL/min (ref >60)
GFR calc non Af Amer: >60 mL/min (ref >60)
Glucose, Bld: 183 mg/dL — ABNORMAL HIGH (ref 65–99)
POTASSIUM: 3.4 mmol/L — AB (ref 3.5–5.1)
Sodium: 137 mmol/L (ref 135–145)
TOTAL PROTEIN: 8.2 g/dL — AB (ref 6.5–8.1)

## 2015-10-02 LAB — CBC
HCT: 36.8 % — ABNORMAL LOW (ref 40.0–52.0)
HEMOGLOBIN: 12 g/dL — AB (ref 13.0–18.0)
MCH: 31.3 pg (ref 26.0–34.0)
MCHC: 32.7 g/dL (ref 32.0–36.0)
MCV: 95.7 fL (ref 80.0–100.0)
Platelets: 271 10*3/uL (ref 150–440)
RBC: 3.85 MIL/uL — AB (ref 4.40–5.90)
RDW: 13.6 % (ref 11.5–14.5)
WBC: 10.8 10*3/uL — AB (ref 3.8–10.6)

## 2015-10-02 LAB — ETHANOL: Alcohol, Ethyl (B): <5 mg/dL (ref <5)

## 2015-10-02 LAB — URINE DRUG SCREEN, QUALITATIVE (ARMC ONLY)
AMPHETAMINES, UR SCREEN: NOT DETECTED
BARBITURATES, UR SCREEN: NOT DETECTED
Benzodiazepine, Ur Scrn: NOT DETECTED
COCAINE METABOLITE, UR ~~LOC~~: NOT DETECTED
Cannabinoid 50 Ng, Ur ~~LOC~~: NOT DETECTED
MDMA (ECSTASY) UR SCREEN: NOT DETECTED
METHADONE SCREEN, URINE: NOT DETECTED
Opiate, Ur Screen: NOT DETECTED
Phencyclidine (PCP) Ur S: NOT DETECTED
TRICYCLIC, UR SCREEN: NOT DETECTED

## 2015-10-02 LAB — ACETAMINOPHEN LEVEL

## 2015-10-02 LAB — SALICYLATE LEVEL

## 2015-10-02 MED ORDER — DIPHENHYDRAMINE HCL 50 MG/ML IJ SOLN
50.0000 mg | Freq: Once | INTRAMUSCULAR | Status: AC
  Administered 2015-10-02: 50 mg via INTRAMUSCULAR
  Filled 2015-10-02: qty 1

## 2015-10-02 MED ORDER — HALOPERIDOL LACTATE 5 MG/ML IJ SOLN
5.0000 mg | Freq: Once | INTRAMUSCULAR | Status: AC
  Administered 2015-10-02: 5 mg via INTRAMUSCULAR
  Filled 2015-10-02: qty 1

## 2015-10-02 MED ORDER — LORAZEPAM 2 MG/ML IJ SOLN
2.0000 mg | Freq: Once | INTRAMUSCULAR | Status: AC
  Administered 2015-10-02: 2 mg via INTRAMUSCULAR
  Filled 2015-10-02: qty 1

## 2015-10-02 NOTE — ED Notes (Signed)
Meal tray provided.

## 2015-10-02 NOTE — ED Notes (Signed)

## 2015-10-02 NOTE — ED Notes (Signed)
Patient prefers to be called "[name]", when addressed to. Pt was given a food try, a ginger ale and (2) blankets

## 2015-10-02 NOTE — ED Notes (Signed)
Pt is sleeping at this time. Pt is breathing even and unlabored. No distress noted. ODS officer left the bedside at this this time. ODS officer still in view of patient q3115min checks.

## 2015-10-02 NOTE — ED Provider Notes (Signed)
Time Seen: Approximately 1723  I have reviewed the triage notes  Chief Complaint: Psychiatric Evaluation   History of Present Illness: [name] is a 55 y.o. male who is brought in with IVC orders per local police department. Patient apparently was at a group home and has a history of schizophrenia and had been saying some delusional type statements he states his name is really [name] and he is not sure why people keep calling him really and sometimes he will not respond to that. Instead some threatening behavior toward his roommates. Patient is evasive of questions when discussing his past history etc.*He denies any suicidal thoughts but has expressed thoughts of "" hurting white people "". Patient denies any physical complaints such as headaches, chest pain, abdominal pain, etc. Patient will not answer the question when asked if he is taking his medications.  Past Medical History:  Diagnosis Date  . Schizophrenia (HCC)     There are no active problems to display for this patient.   No past surgical history on file.  No past surgical history on file.    Allergies:  Review of patient's allergies indicates no known allergies.  Family History: No family history on file.  Social History: Social History  Substance Use Topics  . Smoking status: Not on file  . Smokeless tobacco: Not on file  . Alcohol use Not on file     Review of Systems:   10 point review of systems was performed and was otherwise negative:  Constitutional: No fever Eyes: No visual disturbances ENT: No sore throat, ear pain Cardiac: No chest pain Respiratory: No shortness of breath, wheezing, or stridor Abdomen: No abdominal pain, no vomiting, No diarrhea Endocrine: No weight loss, No night sweats Extremities: No peripheral edema, cyanosis Skin: No rashes, easy bruising Neurologic: No focal weakness, trouble with speech or swollowing Urologic: No dysuria, Hematuria, or urinary  frequency   Physical Exam:  ED Triage Vitals  Enc Vitals Group     BP --      Pulse Rate 10/02/15 1708 (!) 116     Resp 10/02/15 1708 20     Temp 10/02/15 1708 98 F (36.7 C)     Temp Source 10/02/15 1708 Oral     SpO2 10/02/15 1708 97 %     Weight 10/02/15 1709 140 lb (63.5 kg)     Height 10/02/15 1709 5\' 8"  (1.727 m)     Head Circumference --      Peak Flow --      Pain Score --      Pain Loc --      Pain Edu? --      Excl. in GC? --     General: Awake , Alert , and Oriented times 3; GCS 15 Head: Normal cephalic , atraumatic Eyes: Pupils equal , round, reactive to light Nose/Throat: No nasal drainage, patent upper airway without erythema or exudate. Poor dentition Neck: Supple, Full range of motion, No anterior adenopathy or palpable thyroid masses Lungs: Clear to ascultation without wheezes , rhonchi, or rales Heart: Regular rate, regular rhythm without murmurs , gallops , or rubs Abdomen: Soft, non tender without rebound, guarding , or rigidity; bowel sounds positive and symmetric in all 4 quadrants. No organomegaly .        Extremities: 2 plus symmetric pulses. No edema, clubbing or cyanosis Neurologic: normal ambulation, Motor symmetric without deficits, sensory intact Skin: warm, dry, no rashes   Labs:   All laboratory work  was reviewed including any pertinent negatives or positives listed below:  Labs Reviewed  COMPREHENSIVE METABOLIC PANEL - Abnormal; Notable for the following:       Result Value   Potassium 3.4 (*)    Glucose, Bld 183 (*)    Calcium 8.7 (*)    Total Protein 8.2 (*)    ALT 11 (*)    All other components within normal limits  ACETAMINOPHEN LEVEL - Abnormal; Notable for the following:    Acetaminophen (Tylenol), Serum <10 (*)    All other components within normal limits  CBC - Abnormal; Notable for the following:    WBC 10.8 (*)    RBC 3.85 (*)    Hemoglobin 12.0 (*)    HCT 36.8 (*)    All other components within normal limits  ETHANOL   SALICYLATE LEVEL  URINE DRUG SCREEN, QUALITATIVE ([facility] ONLY)  Laboratory work was reviewed and showed no clinically significant abnormalities.     ED Course:  Patient had involuntary commitment work papers filled out. He became very agitated while here in emergency department and expressed some violent tendencies towards the nursing staff. Patient required sedation with Haldol 5 mg, Ativan 2 mg IM, and Benadryl 50 mg IM. Patient's currently resting comfortably and will require further psychiatric observation and evaluation.   Clinical Course     Assessment:  Acute psychosis Violent behavior     Plan:  Psychiatric consultation            Jennye Moccasin, MD 10/02/15 2034

## 2015-10-02 NOTE — BH Assessment (Signed)
Assessment Note  [name] is an 55 y.o. male.  Patient was brought into the ED by [facility] under IVC initiated by [facility].  It was reported that the patient is threatening to his housemates, responding to internal stimuli, delusional, and not sleeping.   This writer was unable to complete full assessment because patient was medically sedated after becoming physically aggressive with ED staff.  According to staff patient was banging on the metal gate behind bed, belligerent towards Caucasian females plus cops, expressing desire to not live anymore, and talking to the television.   This write spoke with [facility] staff member at group home [phone] to collect collateral information.  It was reports he arrived to the facility one week ago from [facility]  She reports the patient is under the care of [facility] [phone] and was assessed by [name] today.  Patient had not been taking all of his medication therefore only picking and choosing which medications he wanted to have.  Patient was not sleeping at night and this morning was outside standing in front of a tree flicking a lighter.     Diagnosis: Schizophrenia, paranoid type  Past Medical History:  Past Medical History:  Diagnosis Date  . Schizophrenia (HCC)     No past surgical history on file.  Family History: No family history on file.  Social History:  has no tobacco, alcohol, and drug history on file.  Additional Social History:  Alcohol / Drug Use Pain Medications: see chart Prescriptions: see chart Over the Counter: see chart History of alcohol / drug use?: No history of alcohol / drug abuse Longest period of sobriety (when/how long):  (Pt unable to participate) Negative Consequences of Use:  (Pt unable to participate) Withdrawal Symptoms:  (Pt unable to participate)  CIWA: CIWA-Ar Pulse Rate: (!) 116 COWS:    Allergies: No Known Allergies  Home Medications:  (Not in a hospital  admission)  OB/GYN Status:  No LMP for male patient.  General Assessment Data Location of Assessment: [facility] ED TTS Assessment: In system Is this a Tele or Face-to-Face Assessment?: Face-to-Face Is this an Initial Assessment or a Re-assessment for this encounter?: Initial Assessment Marital status: Single Maiden name: na Is patient pregnant?: No Pregnancy Status: No Living Arrangements: Group Home ([name]) Can pt return to current living arrangement?:  (unknown at this time) Admission Status: Involuntary Is patient capable of signing voluntary admission?: No Referral Source: Other Insurance type: MCD  Medical Screening Exam [facility] Walk-in ONLY) Medical Exam completed: Yes  Crisis Care Plan Living Arrangements: Group Home ([name]) Name of Psychiatrist: Frederich [name] Name of Therapist: Frederich [name]  Education Status Is patient currently in school?: No Current Grade: na Highest grade of school patient has completed:  (unknown at this time) Name of school: na Contact person: unknown  Risk to self with the past 6 months Suicidal Ideation: Yes-Currently Present (Pt. reports wanting to die and be with his mother) Has patient been a risk to self within the past 6 months prior to admission? :  (Pt has hx inpt hospitalizations.  ) Suicidal Intent:  (unable to assess) Has patient had any suicidal intent within the past 6 months prior to admission? :  (unable to assess) Is patient at risk for suicide?:  (unable to assess) Suicidal Plan?:  (unable to assess) Has patient had any suicidal plan within the past 6 months prior to admission? :  (unable to assess) Access to Means:  (unable to assess)  What has been your use of drugs/alcohol within the last 12 months?:  (unable to assess) Previous Attempts/Gestures:  (unable to assess) How many times?:  (unable to assess) Other Self Harm Risks:  (unable to assess) Triggers for Past Attempts:  (unable to assess) Intentional Self  Injurious Behavior:  (unable to assess) Family Suicide History:  (unable to assess) Recent stressful life event(s):  (unable to assess) Persecutory voices/beliefs?:  (unable to assess) Depression:  (unable to assess) Depression Symptoms:  (unable to assess) Substance abuse history and/or treatment for substance abuse?:  (unable to assess)  Risk to Others within the past 6 months Homicidal Ideation:  (unable to assess) Does patient have any lifetime risk of violence toward others beyond the six months prior to admission? :  (unable to assess) Thoughts of Harm to Others:  (unable to assess) Current Homicidal Intent:  (unable to assess) Current Homicidal Plan:  (unable to assess) Access to Homicidal Means:  (unable to assess) Identified Victim:  (unable to assess) History of harm to others?:  (Pt was aggressive in the ED) Assessment of Violence: On admission Violent Behavior Description: threatened group home peers, and ED staff Does patient have access to weapons?:  (unable to assess) Criminal Charges Pending?:  (unable to assess) Does patient have a court date:  (unable to assess) Is patient on probation?:  (unable to assess)  Psychosis Hallucinations: Auditory, With command Delusions:  (unable to assess)  Mental Status Report Appearance/Hygiene: In scrubs Eye Contact: Unable to Assess Motor Activity: Unable to assess Speech: Unable to assess Level of Consciousness: Unable to assess Mood:  (unable to assess) Affect: Unable to Assess Anxiety Level:  (unable to assess) Thought Processes: Unable to Assess Judgement: Unable to Assess Orientation: Unable to assess Obsessive Compulsive Thoughts/Behaviors: Unable to Assess  Cognitive Functioning Concentration: Unable to Assess Memory: Unable to Assess IQ:  (unable to assess) Insight: Unable to Assess Impulse Control: Unable to Assess Appetite:  (unable to assess) Weight Loss:  (unable to assess) Weight Gain:  (unable to  assess) Sleep: Unable to Assess Total Hours of Sleep:  (unable to assess) Vegetative Symptoms: Unable to Assess  ADLScreening [facility]) Patient's cognitive ability adequate to safely complete daily activities?:  (unable to assess) Patient able to express need for assistance with ADLs?:  (unable to assess) Independently performs ADLs?:  (unable to assess)  Prior Inpatient Therapy Prior Inpatient Therapy: Yes (Per [facility] staff pt recently inpt with [facility]) Prior Therapy Dates: 1 week ago Prior Therapy Facilty/Provider(s): [facility] Reason for Treatment:  (unknown at this time)  Prior Outpatient Therapy Prior Outpatient Therapy: Yes Prior Therapy Dates: currently Prior Therapy Facilty/Provider(s): [facility] Reason for Treatment: Psychosis Does patient have an ACCT team?: Yes Does patient have Intensive In-House Services?  : No Does patient have [facility] services? : Unknown Does patient have P4CC services?: Unknown  ADL Screening (condition at time of admission) Patient's cognitive ability adequate to safely complete daily activities?:  (unable to assess) Patient able to express need for assistance with ADLs?:  (unable to assess) Independently performs ADLs?:  (unable to assess)       Abuse/Neglect Assessment (Assessment to be complete while patient is alone) Physical Abuse:  (pt unable to participate) Verbal Abuse:  (Pt unable to particpate) Sexual Abuse:  (Pt unable to participate) Exploitation of patient/patient's resources:  (Pt uanble to participate) Self-Neglect:  (Pt unable to participate) Possible abuse reported to::  (Pt unable to participate) Values / Beliefs Cultural Requests During Hospitalization:  (  Pt unable to participate) Spiritual Requests During Hospitalization:  (Pt unable to participate) Consults Spiritual Care Consult Needed:  (Pt unable to pariticipate) Social Work Consult Needed:  (Pt unable to participate)      Additional  Information 1:1 In Past 12 Months?: Yes Elopement Risk: Yes Does patient have medical clearance?: Yes     Disposition:  Disposition Initial Assessment Completed for this Encounter: Yes Disposition of Patient: Other dispositions (Pending) Other disposition(s): Other (Comment) (Pending)  On Site Evaluation by:   Reviewed with Physician:    Maryelizabeth Rowanorbett, Gurjit Loconte A 10/02/2015 9:53 PM

## 2015-10-02 NOTE — ED Notes (Signed)
Sherilyn CooterHenry, RN contacted pharmacy to verify mixing medications. Informed benadryl must be left in a syringe by itself but ativan and haldol could be combined.

## 2015-10-02 NOTE — ED Triage Notes (Addendum)
Pt brought in under IVC with [facility]. Pt was brought in from his group home with reports that pt is schizophrenic and has been delusional and has been telling people his name is patrick and this am around 0430 was found outside the group home with a lighter. Pt has been threatening his roommates. Pt will not answer questions in triage. Pt having flight of ideas. Pt did report NO SI.

## 2015-10-02 NOTE — ED Notes (Signed)
Pt talking to himself when enter room. Once talking to pt her started yelling cursing hitting metal gate. Pt denies any pain. Pt denies hearing or seeing things. Pt denies SI and HI. Pt talking bizarre, pressured, word salad. Pt talking about jesus wanting to have miscarriage and be  Buried next to mother, Pt states "Im tired of seeing white fucking women,  They mess with my penis". Dr. Huel CoteQuigley made aware. [facility] officer called to assist with this pt. MD ordering medications since nurse and ODS officer couldn't verbally deescalate the situation.

## 2015-10-02 NOTE — ED Notes (Signed)
Per Doctor Huel CoteQuigley ok to get just HR and Pulse post sedation. Patient resting quietly equal unlabored respirations

## 2015-10-02 NOTE — ED Notes (Signed)
Pt given ativan, diphenhydramine, and haldol IM due combative behavior. Pt is a threat to himself and others. ODS officer and nurse  at the bedside trying to deescalate the situation.

## 2015-10-03 ENCOUNTER — Inpatient Hospital Stay
Admission: EM | Admit: 2015-10-03 | Discharge: 2015-10-08 | DRG: 885 | Disposition: A | Discharge status: Home / Self Care | Payer: Medicare Other | Source: Intra-hospital | Attending: Psychiatry | Admitting: Psychiatry

## 2015-10-03 DIAGNOSIS — F209 Schizophrenia, unspecified: Secondary | ICD-10-CM | POA: Diagnosis present

## 2015-10-03 DIAGNOSIS — F419 Anxiety disorder, unspecified: Secondary | ICD-10-CM | POA: Diagnosis present

## 2015-10-03 DIAGNOSIS — Z9119 Patient's noncompliance with other medical treatment and regimen: Secondary | ICD-10-CM

## 2015-10-03 DIAGNOSIS — F17200 Nicotine dependence, unspecified, uncomplicated: Secondary | ICD-10-CM

## 2015-10-03 DIAGNOSIS — Z5181 Encounter for therapeutic drug level monitoring: Secondary | ICD-10-CM

## 2015-10-03 DIAGNOSIS — F17210 Nicotine dependence, cigarettes, uncomplicated: Secondary | ICD-10-CM | POA: Diagnosis present

## 2015-10-03 DIAGNOSIS — Z91199 Patient's noncompliance with other medical treatment and regimen due to unspecified reason: Secondary | ICD-10-CM

## 2015-10-03 DIAGNOSIS — F203 Undifferentiated schizophrenia: Secondary | ICD-10-CM

## 2015-10-03 DIAGNOSIS — Z79899 Other long term (current) drug therapy: Secondary | ICD-10-CM | POA: Diagnosis not present

## 2015-10-03 DIAGNOSIS — F29 Unspecified psychosis not due to a substance or known physiological condition: Secondary | ICD-10-CM | POA: Diagnosis present

## 2015-10-03 MED ORDER — HYDROXYZINE HCL 25 MG PO TABS
25.0000 mg | ORAL_TABLET | Freq: Three times a day (TID) | ORAL | Status: DC | PRN
  Administered 2015-10-03: 25 mg via ORAL
  Filled 2015-10-03: qty 1

## 2015-10-03 MED ORDER — PALIPERIDONE ER 6 MG PO TB24
6.0000 mg | ORAL_TABLET | Freq: Every day | ORAL | Status: DC
  Filled 2015-10-03: qty 1

## 2015-10-03 MED ORDER — LORAZEPAM 2 MG PO TABS: 2.0000 mg | ORAL_TABLET | ORAL | Status: DC | PRN

## 2015-10-03 MED ORDER — PALIPERIDONE ER 3 MG PO TB24
6.0000 mg | ORAL_TABLET | Freq: Every day | ORAL | Status: DC
  Filled 2015-10-03: qty 2

## 2015-10-03 MED ORDER — BENZTROPINE MESYLATE 1 MG PO TABS
0.5000 mg | ORAL_TABLET | Freq: Two times a day (BID) | ORAL | Status: DC
  Administered 2015-10-03 – 2015-10-05 (×4): 0.5 mg via ORAL
  Filled 2015-10-03 (×3): qty 1

## 2015-10-03 MED ORDER — BENZTROPINE MESYLATE 0.5 MG PO TABS: 0.5000 mg | ORAL_TABLET | Freq: Two times a day (BID) | ORAL | Status: DC

## 2015-10-03 MED ORDER — DIVALPROEX SODIUM ER 500 MG PO TB24: 1000.0000 mg | ORAL_TABLET | Freq: Every day | ORAL | Status: DC

## 2015-10-03 MED ORDER — MAGNESIUM HYDROXIDE 400 MG/5ML PO SUSP: 30.0000 mL | Freq: Every day | ORAL | Status: DC | PRN

## 2015-10-03 MED ORDER — ALUM & MAG HYDROXIDE-SIMETH 200-200-20 MG/5ML PO SUSP: 30.0000 mL | ORAL | Status: DC | PRN

## 2015-10-03 MED ORDER — ACETAMINOPHEN 325 MG PO TABS: 650.0000 mg | ORAL_TABLET | Freq: Four times a day (QID) | ORAL | Status: DC | PRN

## 2015-10-03 MED ORDER — DIVALPROEX SODIUM ER 500 MG PO TB24
1000.0000 mg | ORAL_TABLET | Freq: Every day | ORAL | Status: DC
Start: 2015-10-03 — End: 2015-10-04
  Administered 2015-10-03: 1000 mg via ORAL
  Filled 2015-10-03: qty 2

## 2015-10-03 MED ORDER — NICOTINE 21 MG/24HR TD PT24
21.0000 mg | MEDICATED_PATCH | Freq: Every day | TRANSDERMAL | Status: DC
  Filled 2015-10-03: qty 1

## 2015-10-03 NOTE — ED Notes (Signed)
Holding lunch until [place] leaves.

## 2015-10-03 NOTE — Consult Note (Signed)
[facility] Consult   Reason for Consult:  Consult for 55 year old man with a history of schizophrenia brought in under involuntary commitment from his group home Referring Physician:  Eual Fines Patient Identification: [name] MRN:  [id number] Principal Diagnosis: Schizophrenia [facility]) Diagnosis:   Patient Active Problem List   Diagnosis Date Noted  . Schizophrenia ([place]) [F20.9] 10/03/2015  . Noncompliance [Z91.19] 10/03/2015    Total Time spent with patient: 1 hour  Subjective:   [name] is a 56 y.o. male patient admitted with "I just want to go home. I didn't do anything.".  HPI:  Patient interviewed. Chart reviewed. Labs and vitals reviewed. Spoke with a Patent attorney from the [employer] act team who knows the patient's care. Patient was brought in under involuntary commitment that alleges that he had been threatening to his roommate and had been acting strangely with a lighter outside the house late at night. Allegations also that he is noncompliant with medication. Patient denies all of this. He says that he was being very pleasant and accommodating with his roommate. Totally denies that he did or said anything threatening. As his mood has been fine. He admits that he gets up at night to smoke smoke but claims that he was simply smoking a cigarette and not doing anything that anyone would think was unusual. He denies that he is having auditory or visual hallucinations. He says he is fully compliant with all of his medicine although when pressed he admits that there was at least one time when he left some of the pills down on the table and did not take them. Act team representative tells a story that supports the commitment petition. Act team representative went out to the group home yesterday to try to de-escalate a patient who was actively arguing and threatening his roommate. Acting bizarre. They seem to be clear that he had been refusing at least  some of his medication. The report was that he was actually standing out by a tree in the front yard flicking his lighter at the tree. When I went back to report all this to the patient he abruptly changed and became agitated and disorganized and paranoid.  Medical history: Chronic schizophrenia but otherwise no ongoing medical problems.  Substance abuse history: Denies that he's been drinking or using any drugs recently. 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  Social history: Patient has been in and out of psychiatric hospitals multiple times over the last year. He had been in the hospital at [place] a couple months ago and they discharged him to the homeless shelter in [place]. Act team was contacted and made arrangements to bring him back to a group home here in [facility]. 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 Doesn't have close contact with any of his family.  Past Psychiatric History: Patient has a long-standing history of schizophrenia. Multiple hospitalizations. He was last here at our facility in 2015 and at that time we got him on to a long-acting injectable. He was lost to follow up sometime after that and according to the act team has had probably 10 or 12 hospitalizations in the last year. Medications appear to be chaotic and he frequently is noncompliant. He denies that he is ever actually tried to kill himself. Has been aggressive especially verbally in the past but has not been violent in a  predatory manner at least recently although when paranoid he can get pretty agitated.  Risk to Self: Suicidal Ideation: Yes-Currently Present (Pt. reports wanting to die and be with his mother) Suicidal Intent:  (unable to assess) Is patient at risk for suicide?:  (unable to assess) Suicidal Plan?:  (unable to assess) Access to Means:  (unable to assess) What has been your use  of drugs/alcohol within the last 12 months?:  (unable to assess) How many times?:  (unable to assess) Other Self Harm Risks:  (unable to assess) Triggers for Past Attempts:  (unable to assess) Intentional Self Injurious Behavior:  (unable to assess) Risk to Others: Homicidal Ideation:  (unable to assess) Thoughts of Harm to Others:  (unable to assess) Current Homicidal Intent:  (unable to assess) Current Homicidal Plan:  (unable to assess) Access to Homicidal Means:  (unable to assess) Identified Victim:  (unable to assess) History of harm to others?:  (Pt was aggressive in the ED) Assessment of Violence: On admission Violent Behavior Description: threatened group home peers, and ED staff Does patient have access to weapons?:  (unable to assess) Criminal Charges Pending?:  (unable to assess) Does patient have a court date:  (unable to assess) Prior Inpatient Therapy: Prior Inpatient Therapy: Yes (Per [facility] staff pt recently inpt with [facility]) Prior Therapy Dates: 1 week ago Prior Therapy Facilty/Provider(s): [facility] Reason for Treatment:  (unknown at this time) Prior Outpatient Therapy: Prior Outpatient Therapy: Yes Prior Therapy Dates: currently Prior Therapy Facilty/Provider(s): [employer] Reason for Treatment: Psychosis Does patient have an ACCT team?: Yes Does patient have Intensive In-House Services?  : No Does patient have [facility] services? : Unknown Does patient have P4CC services?: Unknown  Past Medical History:  Past Medical History:  Diagnosis Date  . Schizophrenia ([place])    No past surgical history on file. Family History: No family history on file. Family Psychiatric  History: Does not know of any family mental health history Social History:  History  Alcohol use Not on file     History  Drug use: Unknown    Social History   Social History  . Marital status: Single    Spouse name: N/A  . Number of children: N/A  . Years of education: N/A   Social  History Main Topics  . Smoking status: None  . Smokeless tobacco: None  . Alcohol use None  . Drug use: Unknown  . Sexual activity: Not Asked   Other Topics Concern  . None   Social History Narrative  . None   Additional Social History:    Allergies:   Allergies  Allergen Reactions  . Haldol [Haloperidol] Other (See Comments)    unspecified    Labs:  Results for orders placed or performed during the hospital encounter of 10/02/15 (from the past 48 hour(s))  Urine Drug Screen, Qualitative     Status: None   Collection Time: 10/02/15  5:11 PM  Result Value Ref Range   Tricyclic, Ur Screen NONE DETECTED NONE DETECTED   Amphetamines, Ur Screen NONE DETECTED NONE DETECTED   MDMA (Ecstasy)Ur Screen NONE DETECTED NONE DETECTED   Cocaine Metabolite,Ur [place] NONE DETECTED NONE DETECTED   Opiate, Ur Screen NONE DETECTED NONE DETECTED   Phencyclidine (PCP) Ur S NONE DETECTED NONE DETECTED   Cannabinoid 50 Ng, Ur [place] NONE DETECTED NONE DETECTED   Barbiturates, Ur Screen NONE DETECTED NONE DETECTED   Benzodiazepine, Ur Scrn NONE DETECTED NONE DETECTED   Methadone Scn, Ur NONE DETECTED NONE  DETECTED    Comment: (NOTE) 021  Tricyclics, urine               Cutoff 1000 ng/mL 200  Amphetamines, urine             Cutoff 1000 ng/mL 300  MDMA (Ecstasy), urine           Cutoff 500 ng/mL 400  Cocaine Metabolite, urine       Cutoff 300 ng/mL 500  Opiate, urine                   Cutoff 300 ng/mL 600  Phencyclidine (PCP), urine      Cutoff 25 ng/mL 700  Cannabinoid, urine              Cutoff 50 ng/mL 800  Barbiturates, urine             Cutoff 200 ng/mL 900  Benzodiazepine, urine           Cutoff 200 ng/mL 1000 Methadone, urine                Cutoff 300 ng/mL 1100 1200 The urine drug screen provides only a preliminary, unconfirmed 1300 analytical test result and should not be used for non-medical 1400 purposes. Clinical consideration and professional judgment should 1500 be applied to any  positive drug screen result due to possible 1600 interfering substances. A more specific alternate chemical method 1700 must be used in order to obtain a confirmed analytical result.  1800 Gas chromato graphy / mass spectrometry (GC/MS) is the preferred 1900 confirmatory method.   Comprehensive metabolic panel     Status: Abnormal   Collection Time: 10/02/15  5:12 PM  Result Value Ref Range   Sodium 137 135 - 145 mmol/L   Potassium 3.4 (L) 3.5 - 5.1 mmol/L   Chloride 103 101 - 111 mmol/L   CO2 24 22 - 32 mmol/L   Glucose, Bld 183 (H) 65 - 99 mg/dL   BUN 11 6 - 20 mg/dL   Creatinine, Ser 1.00 0.61 - 1.24 mg/dL   Calcium 8.7 (L) 8.9 - 10.3 mg/dL   Total Protein 8.2 (H) 6.5 - 8.1 g/dL   Albumin 3.6 3.5 - 5.0 g/dL   AST 24 15 - 41 U/L   ALT 11 (L) 17 - 63 U/L   Alkaline Phosphatase 75 38 - 126 U/L   Total Bilirubin 0.5 0.3 - 1.2 mg/dL   GFR calc non Af Amer >60 >60 mL/min   GFR calc Af Amer >60 >60 mL/min    Comment: (NOTE) The eGFR has been calculated using the CKD EPI equation. This calculation has not been validated in all clinical situations. eGFR's persistently <60 mL/min signify possible Chronic Kidney Disease.    Anion gap 10 5 - 15  Ethanol     Status: None   Collection Time: 10/02/15  5:12 PM  Result Value Ref Range   Alcohol, Ethyl (B) <5 <5 mg/dL    Comment:        LOWEST DETECTABLE LIMIT FOR SERUM ALCOHOL IS 5 mg/dL FOR MEDICAL PURPOSES ONLY   Salicylate level     Status: None   Collection Time: 10/02/15  5:12 PM  Result Value Ref Range   Salicylate Lvl <1.1 2.8 - 30.0 mg/dL  Acetaminophen level     Status: Abnormal   Collection Time: 10/02/15  5:12 PM  Result Value Ref Range   Acetaminophen (Tylenol), Serum <10 (L) 10 - 30 ug/mL  Comment:        THERAPEUTIC CONCENTRATIONS VARY SIGNIFICANTLY. A RANGE OF 10-30 ug/mL MAY BE AN EFFECTIVE CONCENTRATION FOR MANY PATIENTS. HOWEVER, SOME ARE BEST TREATED AT CONCENTRATIONS OUTSIDE THIS RANGE. ACETAMINOPHEN  CONCENTRATIONS >150 ug/mL AT 4 HOURS AFTER INGESTION AND >50 ug/mL AT 12 HOURS AFTER INGESTION ARE OFTEN ASSOCIATED WITH TOXIC REACTIONS.   cbc     Status: Abnormal   Collection Time: 10/02/15  5:12 PM  Result Value Ref Range   WBC 10.8 (H) 3.8 - 10.6 K/uL   RBC 3.85 (L) 4.40 - 5.90 MIL/uL   Hemoglobin 12.0 (L) 13.0 - 18.0 g/dL   HCT 36.8 (L) 40.0 - 52.0 %   MCV 95.7 80.0 - 100.0 fL   MCH 31.3 26.0 - 34.0 pg   MCHC 32.7 32.0 - 36.0 g/dL   RDW 13.6 11.5 - 14.5 %   Platelets 271 150 - 440 K/uL    No current facility-administered medications for this encounter.    Current Outpatient Prescriptions  Medication Sig Dispense Refill  . benztropine (COGENTIN) 0.5 MG tablet 1 tablet 2 (two) times daily.    . divalproex (DEPAKOTE) 250 MG DR tablet 1 tablet at bedtime.    Marland Kitchen OLANZapine (ZYPREXA) 5 MG tablet 1 tablet 2 (two) times daily.    . risperiDONE (RISPERDAL) 1 MG tablet 1 tablet at bedtime.      Musculoskeletal: Strength & Muscle Tone: within normal limits Gait & Station: normal Patient leans: N/A  Psychiatric Specialty Exam: Physical Exam  Nursing note and vitals reviewed. Constitutional: He appears well-developed and well-nourished.  HENT:  Head: Normocephalic and atraumatic.    Eyes: Conjunctivae are normal. Pupils are equal, round, and reactive to light.  Neck: Normal range of motion.  Cardiovascular: Regular rhythm and normal heart sounds.   Respiratory: Effort normal. No respiratory distress.  GI: Soft.  Musculoskeletal: Normal range of motion.  Neurological: He is alert.  Skin: Skin is warm and dry.  Psychiatric: His affect is labile and inappropriate. His speech is tangential. He is agitated. Thought content is paranoid. Cognition and memory are normal. He expresses impulsivity and inappropriate judgment. He expresses no homicidal and no suicidal ideation.    Review of Systems  Constitutional: Negative.   HENT: Negative.   Eyes: Negative.   Respiratory:  Negative.   Cardiovascular: Negative.   Gastrointestinal: Negative.   Musculoskeletal: Negative.   Skin: Negative.   Neurological: Negative.   Psychiatric/Behavioral: Negative for depression, hallucinations, memory loss, substance abuse and suicidal ideas. The patient is not nervous/anxious and does not have insomnia.     Blood pressure 123/63, pulse 79, temperature 98.1 F (36.7 C), temperature source Oral, resp. rate 18, height _0  (1.727 m), weight 63.5 kg (140 lb), SpO2 94 %.Body mass index is 21.29 kg/m.  General Appearance: Disheveled  Eye Contact:  Fair  Speech:  Garbled and Pressured  Volume:  Increased  Mood:  Irritable  Affect:  Inappropriate and Labile  Thought Process:  Disorganized  Orientation:  Full (Time, Place, and Person)  Thought Content:  Illogical and Paranoid Ideation  Suicidal Thoughts:  No  Homicidal Thoughts:  No  Memory:  Immediate;   Good Recent;   Fair Remote;   Fair  Judgement:  Impaired  Insight:  Shallow  Psychomotor Activity:  Decreased  Concentration:  Concentration: Poor  Recall:  [employer] of Knowledge:  Fair  Language:  Fair  Akathisia:  No  Handed:  Right  AIMS (if indicated):  Assets:  Contractor Social Support  ADL's:  Intact  Cognition:  Impaired,  Mild  Sleep:        Treatment Plan Summary: Daily contact with patient to assess and evaluate symptoms and progress in treatment, Medication management and Plan 55 year old man with schizophrenia who clearly is not well enough to survive functionally outside the hospital. Paranoid labile explosive psychotic although I don't think he represents an acute threat to anyone here in the hospital. Patient has successfully been treated in the hospital multiple times before. I would put in my suggestion that this is a person who absolutely needs to be on long-acting injectable antipsychotic. I'm going to start him on Invega as  well as continuing his Depakote for now. Admit to psychiatric ward. Full set of labs. 15 minute checks. Act team quite willing to continue following up with him once he is better.  Disposition: Recommend psychiatric Inpatient admission when medically cleared. Supportive therapy provided about ongoing stressors.  Alethia Berthold, MD 10/03/2015 1:13 PM

## 2015-10-03 NOTE — ED Notes (Addendum)
Pt ambulate to bathroom cooperative steady gait. Pt asked when he was he leaving. Pt given a warm blanket. Pt going back to sleep.

## 2015-10-03 NOTE — ED Notes (Signed)
Pt belonging bag was personally walked downstairs to BHU by myself. RN notified

## 2015-10-03 NOTE — ED Notes (Signed)
Pt was given a coca cola soda per request

## 2015-10-03 NOTE — ED Notes (Signed)
Report given to MadisonJenn, RN- behavioral med. States that they will call when they are ready for patient to be escorted down.
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
## 2015-10-03 NOTE — ED Notes (Signed)
Pt asking when he will be able to speak with psych today, pt upset that he cannot leave at this time.

## 2015-10-03 NOTE — ED Notes (Signed)
Pt given breakfast tray

## 2015-10-03 NOTE — ED Notes (Signed)
Cogentin not available in ER pyxis. Awaiting pharmacy to send medication.

## 2015-10-03 NOTE — ED Notes (Signed)
Pt back to room at this time

## 2015-10-03 NOTE — Progress Notes (Signed)
[facility]. Toni Amendlapacs.  Attending Physician will be Dr. Jennet MaduroPucilowska.   Patient has been [facility] to room 319, [facility] Nurse Britt BoozerJenn.   Intake Paper Work has been signed and placed on patient chart.  ER staff is aware of the admission Rivka Barbara( Glenda, ER Sect.; Dr. Susette RacerVeronesse, ER MD; Bella KennedyAllyson,  Patient's Nurse & [facility]). Biagio Snelson K. Sherlon HandingHarris, LCAS-A, LPC-A, [facility]  Counselor 10/03/2015 1:49 PM

## 2015-10-03 NOTE — ED Notes (Signed)
Report from Kimrey, RN. Care assumed by this RN. 
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
## 2015-10-03 NOTE — ED Notes (Addendum)
Psychiatrist at bedside. Provided contact number of Janie, [facility] who called and inquired about patient earlier.
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
## 2015-10-03 NOTE — ED Notes (Signed)
Beh Med called to inquire when patient can go downstairs to room. States that they will call when ready.

## 2015-10-03 NOTE — ED Notes (Signed)
Pt up and ambulates to bathroom with ease.

## 2015-10-03 NOTE — ED Notes (Signed)
Pt step out in the hallway asking what plan was. Pt told he would speak to psych MD later today. Pt states ok and went back to sleep.

## 2015-10-04 ENCOUNTER — Encounter: Payer: Self-pay | Admitting: Psychiatry

## 2015-10-04 DIAGNOSIS — F209 Schizophrenia, unspecified: Principal | ICD-10-CM

## 2015-10-04 DIAGNOSIS — F17200 Nicotine dependence, unspecified, uncomplicated: Secondary | ICD-10-CM

## 2015-10-04 LAB — LIPID PANEL
CHOL/HDL RATIO: 4.3 ratio
CHOLESTEROL: 143 mg/dL (ref 0–200)
HDL: 33 mg/dL — ABNORMAL LOW (ref >40)
LDL Cholesterol: 98 mg/dL (ref 0–99)
Triglycerides: 58 mg/dL (ref <150)
VLDL: 12 mg/dL (ref 0–40)

## 2015-10-04 LAB — TSH: TSH: 0.485 u[IU]/mL (ref 0.350–4.500)

## 2015-10-04 MED ORDER — RISPERIDONE MICROSPHERES 25 MG IM SUSR
25.0000 mg | INTRAMUSCULAR | Status: DC
Start: 2015-10-04 — End: 2015-10-08
  Administered 2015-10-04: 25 mg via INTRAMUSCULAR
  Filled 2015-10-04 (×2): qty 2

## 2015-10-04 MED ORDER — RISPERIDONE MICROSPHERES 12.5 MG IM SUSR
12.5000 mg | INTRAMUSCULAR | Status: DC
  Administered 2015-10-04: 12.5 mg via INTRAMUSCULAR
  Filled 2015-10-04: qty 1

## 2015-10-04 MED ORDER — RISPERIDONE 1 MG PO TABS
3.0000 mg | ORAL_TABLET | Freq: Two times a day (BID) | ORAL | Status: DC
  Administered 2015-10-04 – 2015-10-08 (×8): 3 mg via ORAL
  Filled 2015-10-04 (×9): qty 3

## 2015-10-04 MED ORDER — RISPERIDONE MICROSPHERES 37.5 MG IM SUSR
37.5000 mg | INTRAMUSCULAR | Status: DC
  Filled 2015-10-04: qty 2

## 2015-10-04 NOTE — Progress Notes (Signed)
D: Observed pt in room sleeping on bed. Patient alert and oriented x4. Patient denies SI/HI/AVH. Pt affect is blunted. Pt stated his day was "alright...slept mostly."  Pt indicated he was "tired from work." Pt forwards little, interacts minimally with staff, and isolated to room all evening. Pt has minimal insight into hospitalization stating people from the group home "think I need to be in the hopsital." Pt mentioned that he would be "going back to [place]" a few times.  A: Offered active listening and support. Provided therapeutic communication. Administered scheduled medications. Attempted to educated pt on medications. Encouraged pt to be more active on the unit and attend group. R: Pt pleasant and cooperative. Pt refused evening Invega stating " I always gives me a headache.Marland Kitchen.Marland Kitchen.I can't take that."  Pt compliant with other medications. Will continue Q15 min. checks. Safety maintained.

## 2015-10-04 NOTE — Progress Notes (Signed)
D: Patient presents with flat affect. Almost irritable. Pt did brighten later in shift.Pt denies having HI, SH, AVH. Noted walking in hall talking to self when irritated. New medications ordered for patient. Patient request to take IM meds at bed time.  A: Encouragement and support offered. Medications given as prescribed. IM medications moved to 8pm.  R: Pt receptive, med and group compliant. Appropriate with staff and peers. Remains safe on unit with q 15 min checks.

## 2015-10-04 NOTE — [facility] Group Notes (Signed)
[facility] Group Notes:  (Nursing/MHT/Case Management/Adjunct)  Date:  10/04/2015  Time:  11:09 PM  Type of Therapy:  Group Therapy  Participation Level:  Minimal  Participation Quality:  Attentive  Affect:  Flat  Cognitive:  Alert  Insight:  Limited  Engagement in Group:  Limited  Modes of Intervention:  Discussion  Summary of Progress/Problems: Staff attempted to engage pt. Into conversation about his goal. Pt responded with yes and no, then left group early.   [name] 10/04/2015, 11:09 PM

## 2015-10-04 NOTE — Plan of Care (Signed)
Problem: Education: Goal: Will be free of psychotic symptoms Outcome: Progressing Pt denies AVH and does not appear to be responding to internal stimuli this evening.
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
## 2015-10-04 NOTE — Progress Notes (Signed)
Recreation Therapy Notes  Date: 09.28.17 Time: 1:00 pm Location: Craft Room  Group Topic: Leisure Education  Goal Area(s) Addresses:  Patient will identify activities for each letter of the alphabet. Patient will verbalize ability to integrate positive leisure into life post d/c. Patient will verbalize ability to use leisure as a Associate Professorcoping skill.  Behavioral Response: Attentive, Interactive  Intervention: Leisure Alphabet  Activity: Patients were given a Leisure Information systems managerAlphabet worksheet and instructed to pick healthy leisure activities for each letter of the alphabet.  Education: LRT educated patients on what they need to participate in leisure.  Education Outcome: In group clarification offered   Clinical Observations/Feedback: Patient wrote healthy leisure activities. Patient contributed to group discussion.  [name],Isais Klipfel M, LRT/CTRS 10/04/2015 2:29 PM

## 2015-10-04 NOTE — [facility] Group Notes (Signed)
Goals Group  Date/Time: 9:00 AM Type of Therapy and Topic: Group Therapy: Goals Group: SMART Goals  ?  Participation Level: Moderate  ?  Description of Group:  ?  The purpose of a daily goals group is to assist and guide patients in setting recovery/wellness-related goals. The objective is to set goals as they relate to the crisis in which they were admitted. Patients will be using SMART goal modalities to set measurable goals. Characteristics of realistic goals will be discussed and patients will be assisted in setting and processing how one will reach their goal. Facilitator will also assist patients in applying interventions and coping skills learned in psycho-education groups to the SMART goal and process how one will achieve defined goal.  ?  Therapeutic Goals:  ?  -Patients will develop and document one goal related to or their crisis in which brought them into treatment.  -Patients will be guided by LCSW using SMART goal setting modality in how to set a measurable, attainable, realistic and time sensitive goal.  -Patients will process barriers in reaching goal.  -Patients will process interventions in how to overcome and successful in reaching goal.  ?   Patient's Goal: Pt stated that her goal today is to return back to his family care home. In order to accomplish his goal, he stated that he will comply with medications and listen to his treatment team. ?  Therapeutic Modalities:  Motivational Interviewing  Cognitive Behavioral Therapy  Crisis Intervention Model  SMART goals setting  Hampton AbbotKadijah Tasha Jindra, MSW, LCSW-A 10/04/2015, 10:29AM

## 2015-10-04 NOTE — Plan of Care (Signed)
Problem: Education: Goal: Knowledge of [facility] Education information/materials will improve Outcome: Progressing Pt able to verbalize rules of unit discussed in ref to patient safety, dress. Educated on tobacco cessation. Pt able to verbalize understanding.

## 2015-10-05 LAB — HEMOGLOBIN A1C
HEMOGLOBIN A1C: 4.8 % (ref 4.8–5.6)
MEAN PLASMA GLUCOSE: 91 mg/dL

## 2015-10-05 MED ORDER — AMANTADINE HCL 100 MG PO CAPS
100.0000 mg | ORAL_CAPSULE | Freq: Two times a day (BID) | ORAL | Status: DC
  Administered 2015-10-05 – 2015-10-08 (×6): 100 mg via ORAL
  Filled 2015-10-05 (×6): qty 1

## 2015-10-05 NOTE — Plan of Care (Signed)
Problem: Health Behavior/Discharge Planning: Goal: Compliance with prescribed medication regimen will improve Outcome: Progressing Patient compliant with medication regimen.

## 2015-10-05 NOTE — Progress Notes (Signed)
D: Pt denies SI/HI/AVH. Pt is pleasant and cooperative, affect is flat, mood is less irritable. Pt appears less anxious and he is interacting with peers and staff appropriately.  A: Pt was offered support and encouragement. Pt was given scheduled medications. Pt was encouraged to attend groups. Q 15 minute checks were done for safety.  R:Pt attends groups and interacts well with peers and staff. Pt is taking medication. Pt has no complaints.Pt receptive to treatment and safety maintained on unit.

## 2015-10-05 NOTE — Progress Notes (Signed)
Recreation Therapy Notes  Date: 09.29.17 Time: 9:30 am Location: Craft Room  Group Topic: Problem Solving, Communication, Teamwork  Goal Area(s) Addresses:  Patient will effectively work with peer towards shared goal. Patient will identify skills used to make activity successful. Patient will identify benefit of using group skills effectively post d/c.  Behavioral Response: Attentive  Intervention: [employer] Tower  Activity: Patients were split into groups and given 15 pipe cleaners. They were instructed to build a free standing tower using all 15 pipe cleaners. After about 5 minutes, patients were instructed to put their dominant hand behind their back. After another 5 minutes, patients were instructed to stop talking to each other.  Education: LRT educated patients on healthy support systems.  Education Outcome: In group clarification offered  Clinical Observations/Feedback: Patient initially participated in group activity. Patient did not participate in group discussion.  [name],Junnie Loschiavo M, LRT/CTRS 10/05/2015 10:14 AM

## 2015-10-05 NOTE — [facility] Group Notes (Signed)
[facility] LCSW Group Therapy   10/05/2015 1:00 PM   Type of Therapy: Group Therapy   Participation Level: Active   Participation Quality: Attentive, Sharing and Supportive   Affect: Appropriate   Cognitive: Alert and Oriented   Insight: Developing/Improving and Engaged   Engagement in Therapy: Developing/Improving and Engaged   Modes of Intervention: Clarification, Confrontation, Discussion, Education, Exploration, Limit-setting, Orientation, Problem-solving, Rapport Building, Dance movement psychotherapisteality Testing, Socialization and Support   Summary of Progress/Problems: The topic for today was feelings about relapse. Pt discussed what relapse prevention is to them and identified triggers that they are on the path to relapse. Pt processed their feeling towards relapse and was able to relate to peers. Pt discussed coping skills that can be used for relapse prevention.  Pt defined relapse as "having a bad day." He stated that his intrusive symptoms are mood swings. Pt identified his family care home and participation with the family care home as ways to prevent relapse.     Hampton AbbotKadijah Zephaniah Lubrano, MSW, LCSWA 10/05/2015, 2:32PM
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
## 2015-10-05 NOTE — Tx Team (Signed)
Interdisciplinary Treatment and Diagnostic Plan Update  10/05/2015 Time of Session: 10:49 AM  [name] MRN: [id number]  Principal Diagnosis: Schizophrenia [facility])  Secondary Diagnoses: Principal Problem:   Schizophrenia (HCC) Active Problems:   Noncompliance   Tobacco use disorder   Current Medications:  Current Facility-Administered Medications  Medication Dose Route Frequency Provider Last Rate Last Dose  . acetaminophen (TYLENOL) tablet 650 mg  650 mg Oral Q6H PRN Audery Amel, MD      . alum & mag hydroxide-simeth (MAALOX/MYLANTA) 200-200-20 MG/5ML suspension 30 mL  30 mL Oral Q4H PRN Audery Amel, MD      . amantadine (SYMMETREL) capsule 100 mg  100 mg Oral BID Jimmy Footman, MD      . LORazepam (ATIVAN) tablet 2 mg  2 mg Oral Q4H PRN Audery Amel, MD      . magnesium hydroxide (MILK OF MAGNESIA) suspension 30 mL  30 mL Oral Daily PRN Audery Amel, MD      . nicotine (NICODERM CQ - dosed in mg/24 hours) patch 21 mg  21 mg Transdermal Daily [name] B Pucilowska, MD      . risperiDONE (RISPERDAL) tablet 3 mg  3 mg Oral BID Jimmy Footman, MD   3 mg at 10/05/15 0840  . risperiDONE microspheres (RISPERDAL CONSTA) injection 25 mg  25 mg Intramuscular Q14 Days Jimmy Footman, MD   25 mg at 10/04/15 2207   And  . risperiDONE microspheres (RISPERDAL CONSTA) injection 12.5 mg  12.5 mg Intramuscular Q14 Days Jimmy Footman, MD   12.5 mg at 10/04/15 2205    PTA Medications: Prescriptions Prior to Admission  Medication Sig Dispense Refill Last Dose  . benztropine (COGENTIN) 0.5 MG tablet 1 tablet 2 (two) times daily.   unknown at [facility]  . divalproex (DEPAKOTE) 250 MG DR tablet 1 tablet at bedtime.   unknown at unknown  . OLANZapine (ZYPREXA) 5 MG tablet 1 tablet 2 (two) times daily.   unknown at unknown  . risperiDONE (RISPERDAL) 1 MG tablet 1 tablet at bedtime.   unknown at unknown    Treatment Modalities: Medication  Management, Group therapy, Case management,  1 to 1 session with clinician, Psychoeducation, Recreational therapy.   Physician Treatment Plan for Primary Diagnosis: Schizophrenia (HCC) Long Term Goal(s): Improvement in symptoms so as ready for discharge  Short Term Goals: Ability to identify changes in lifestyle to reduce recurrence of condition will improve, Ability to demonstrate self-control will improve, Ability to identify and develop effective coping behaviors will improve, Compliance with prescribed medications will improve and Ability to identify triggers associated with substance abuse/mental health issues will improve  Medication Management: Evaluate patient's response, side effects, and tolerance of medication regimen.  Therapeutic Interventions: 1 to 1 sessions, Unit Group sessions and Medication administration.  Evaluation of Outcomes: Progressing  Physician Treatment Plan for Secondary Diagnosis: Principal Problem:   Schizophrenia (HCC) Active Problems:   Noncompliance   Tobacco use disorder   Long Term Goal(s): Improvement in symptoms so as ready for discharge  Short Term Goals: Ability to identify changes in lifestyle to reduce recurrence of condition will improve and Ability to identify triggers associated with substance abuse/mental health issues will improve  Medication Management: Evaluate patient's response, side effects, and tolerance of medication regimen.  Therapeutic Interventions: 1 to 1 sessions, Unit Group sessions and Medication administration.  Evaluation of Outcomes: Progressing   RN Treatment Plan for Primary Diagnosis: Schizophrenia (HCC) Long Term Goal(s): Knowledge of disease and therapeutic regimen  to maintain health will improve  Short Term Goals: Ability to remain free from injury will improve, Ability to verbalize frustration and anger appropriately will improve, Ability to demonstrate self-control, Ability to participate in decision making  will improve, Ability to verbalize feelings will improve, Ability to identify and develop effective coping behaviors will improve and Compliance with prescribed medications will improve  Medication Management: RN will administer medications as ordered by provider, will assess and evaluate patient's response and provide education to patient for prescribed medication. RN will report any adverse and/or side effects to prescribing provider.  Therapeutic Interventions: 1 on 1 counseling sessions, Psychoeducation, Medication administration, Evaluate responses to treatment, Monitor vital signs and CBGs as ordered, Perform/monitor CIWA, COWS, AIMS and Fall Risk screenings as ordered, Perform wound care treatments as ordered.  Evaluation of Outcomes: Progressing   LCSW Treatment Plan for Primary Diagnosis: Schizophrenia (HCC) Long Term Goal(s): Safe transition to appropriate next level of care at discharge, Engage patient in therapeutic group addressing interpersonal concerns.  Short Term Goals: Engage patient in aftercare planning with referrals and resources, Increase social support, Increase ability to appropriately verbalize feelings, Increase emotional regulation, Facilitate acceptance of mental health diagnosis and concerns and Increase skills for wellness and recovery  Therapeutic Interventions: Assess for all discharge needs, 1 to 1 time with Social worker, Explore available resources and support systems, Assess for adequacy in community support network, Educate family and significant other(s) on suicide prevention, Complete Psychosocial Assessment, Interpersonal group therapy.  Evaluation of Outcomes: Progressing   Progress in Treatment: Attending groups: Yes Participating in groups: Yes Taking medication as prescribed: Yes, MD continues to assess for medication changes as needed Toleration medication: Yes, no side effects reported at this time Family/Significant other contact made: CSW,  still assessing for appropriate contacts Patient understands diagnosis: Yes Discussing patient identified problems/goals with staff: Yes Medical problems stabilized or resolved:  Yes   Denies suicidal/homicidal ideation: Yes Issues/concerns per patient self-inventory: None Other: N/A  New problem(s) identified: None identified at this time.   New Short Term/Long Term Goal(s): None identified at this time.   Discharge Plan or Barriers: [facility] [place] and will [facility] for medication management and therapy   Reason for Continuation of Hospitalization: Anxiety Depression Suicidal ideation    Estimated Length of Stay/Date of discharge: 3-5 days  Attendees: Patient: Dustin RuthsWillie [name] 10/05/2015 10:43 AM  Physician: Dr. Ardyth HarpsHernandez, MD 10/05/2015 10:43 AM  Nursing: Leonia ReaderPhyllis Cobb, RN 10/05/2015 10:43 AM  RN Care Manager: 10/05/2015 10:43 AM  Social Worker: Dorothe PeaJonathan F. Roylene ReasonRiffey, LCSWA, LCAS 10/05/2015 10:43 AM  Recreational Therapist: Hershal CoriaBeth Greene, LRT 10/05/2015 10:43 AM  Other:  10/05/2015 10:43 AM  Other:  10/05/2015 10:43 AM  Other: 10/05/2015 10:43 AM    Scribe for Treatment Team: Dorothe PeaJonathan F Alysa Duca, LCSWA 10/05/2015

## 2015-10-05 NOTE — [facility] Group Notes (Signed)
[facility] LCSW Group Therapy   10/04/2015  9:30am  *Late Entry  Type of Therapy: Group Therapy   Participation Level: Did Not Attend. Patient invited to participate but declined.    Ricahrd Schwager F. Jay Haskew, MSW, LCSWA, LCAS     

## 2015-10-05 NOTE — Progress Notes (Signed)
Looks tense.  Speech slurred and difficult to understand at times.  Denies SI/HI/AVH.  Denies depression.  Minimal interaction with peers and staff.  Isolates to self.  Support and encouragement offered.  Safety maintained.

## 2015-10-06 DIAGNOSIS — F203 Undifferentiated schizophrenia: Secondary | ICD-10-CM
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
## 2015-10-06 NOTE — [facility] Group Notes (Signed)
[facility] LCSW Group Therapy  10/06/2015 2:21 PM  Type of Therapy:  Group Therapy  Participation Level:  Minimal  Participation Quality:  Attentive  Affect:  Appropriate  Cognitive:  Alert  Insight:  Limited  Engagement in Therapy:  Limited  Modes of Intervention:  Activity, Discussion, Education and Support  Summary of Progress/Problems:Balance in life: Patients will discuss the concept of balance and how it looks and feels to be unbalanced. Pt will identify areas in their life that is unbalanced and ways to become more balanced. Patient stated he would like to improve his sleeping habits and make more efforts to comply with aftercare follow-up to minimize him having to come back to the hospital.   Symantha Steeber G. Garnette CzechSampson MSW, LCSWA 10/06/2015 2:22 PM

## 2015-10-06 NOTE — Progress Notes (Signed)
Pleasant and cooperative.  Denies SI/HI/AVH.  More talkative with staff although continues to spend majority of time in his room. Verbalizes that he is going home on Monday.  Smiles periodically.  Thought processes logical and coherent.  Support and encouragement offered.  Safety maintained.  Medications administered.

## 2015-10-06 NOTE — [facility] Group Notes (Signed)
[facility] Group Notes:  (Nursing/MHT/Case Management/Adjunct)  Date:  10/06/2015  Time:  12:20 AM  Type of Therapy:  Psychoeducational Skills  Participation Level:  Did Not Attend  Summary of Progress/Problems:  Chancy MilroyLaquanda Y Malu Pellegrini 10/06/2015, 12:20 AM

## 2015-10-06 NOTE — [facility] Group Notes (Signed)
[facility] Group Notes:  (Nursing/MHT/Case Management/Adjunct)  Date:  10/06/2015  Time:  10:04 PM  Type of Therapy:  Psychoeducational Skills  Participation Level:  Did Not Attend  Participation QualitySummary of Progress/Problems:  Dustin NeerJackie L Takela Varden 10/06/2015, 10:04 PM

## 2015-10-06 NOTE — Progress Notes (Signed)
D: Pt denies SI/HI/AVH. Pt is pleasant and cooperative, affect is flat and sad but he brightens upon approach.  Pt  appears less anxious and he is interacting with peers and staff appropriately.  A: Pt was offered support and encouragement. Pt was given scheduled medications. Pt was encouraged to attend groups. Q 15 minute checks were done for safety.  R:Pt attends groups and interacts well with peers and staff. Pt is taking medication. Pt has no complaints.Pt receptive to treatment and safety maintained on unit.

## 2015-10-07 NOTE — Progress Notes (Signed)
D:  Pleasant and cooperative. Affect blunted.  More willing to talk to staff when engaged.  Asks questions concerning mediations.  Up to dayroom with peers although no interaction with peers.  Good appetite. Maintaining personal care chores.    A:  Scheduled medications given with education.  Support and encouragement offered.  Q 15 minute rounding performed.  R:  Receptive to treatment regiment.  Safety maintained.

## 2015-10-07 NOTE — [facility] Group Notes (Signed)
[facility] LCSW Group Therapy  10/07/2015 4:43 PM  Type of Therapy:  Group Therapy  Participation Level:  Minimal  Participation Quality:  Attentive  Affect:  Appropriate  Cognitive:  Appropriate  Insight:  Improving  Engagement in Therapy:  Improving  Modes of Intervention:  Activity, Discussion, Education and Support  Summary of Progress/Problems:Safety Planning: Patients identified fears or worries surrounding discharge. Patients offered support to their peers and openly developed safety plans for their individual needs. Patients developed their own safety plan. Patients discussed their warning signs, coping strategies, support system with family and friends, identified mental health professionals, and how to keep their environments safe (ex. Removing unnecessary medications or removing weapons/guns). Patients then discussed their personalized safety plan with the group.    Rin Gorton G. Garnette CzechSampson MSW, LCSWA 10/07/2015, 4:43 PM

## 2015-10-07 NOTE — Progress Notes (Signed)
D: Observed pt in room lying in bed. Patient alert and oriented x4. Patient denies SI/HI/AVH. Pt affect is blunted. Pt isolated to room all day and interacted very minimally with peers and staff. Pt forwarded little saying his day was "alright" and that "I just felt like sleeping...medicine makes me tired." Pt denied feeling anxious or depressed.  A: Offered active listening and support. Provided therapeutic communication. Encouraged pt to attend group and actively participate in care.  R: Pt pleasant and cooperative. Pt isolated to room all evening. Will continue Q15 min. checks. Safety maintained.

## 2015-10-07 NOTE — Plan of Care (Signed)
Problem: Education: Goal: Will be free of psychotic symptoms Outcome: Progressing Denies AVH, alert and oriented
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
## 2015-10-07 NOTE — Plan of Care (Signed)
Problem: Activity: Goal: Interest or engagement in activities will improve Outcome: Not Progressing Pt isolated to room all evening.   

## 2015-10-07 NOTE — [facility] Group Notes (Signed)
[facility] Group Notes:  (Nursing/MHT/Case Management/Adjunct)  Date:  10/07/2015  Time:  9:22 PM  Type of Therapy:  Evening Wrap-up Group  Participation Level:  Did Not Attend  Participation Quality:  N/A  Affect:  N/A  Cognitive:  N/A  Insight:  None  Engagement in Group:  Did Not Attend  Modes of Intervention:  Discussion  Summary of Progress/Problems:  Dustin MorrowChelsea Nanta [name] 10/07/2015, 9:22 PM

## 2015-10-08 MED ORDER — RISPERIDONE MICROSPHERES 37.5 MG IM SUSR: 37.5000 mg | INTRAMUSCULAR | 0 refills | Status: DC

## 2015-10-08 MED ORDER — AMANTADINE HCL 100 MG PO CAPS: 100.0000 mg | ORAL_CAPSULE | Freq: Two times a day (BID) | ORAL | 0 refills | Status: DC

## 2015-10-08 MED ORDER — RISPERIDONE 3 MG PO TABS: 3.0000 mg | ORAL_TABLET | Freq: Two times a day (BID) | ORAL | 0 refills | Status: DC

## 2015-10-08 NOTE — [facility] Group Notes (Signed)
[facility] LCSW Group Therapy   10/08/2015 1pm Type of Therapy: Group Therapy   Participation Level: Active   Participation Quality: Attentive, Sharing and Supportive   Affect: Appropriate  Cognitive: Alert and Oriented   Insight: Developing/Improving and Engaged   Engagement in Therapy: Developing/Improving and Engaged   Modes of Intervention: Clarification, Confrontation, Discussion, Education, Exploration,  Limit-setting, Orientation, Problem-solving, Rapport Building, Dance movement psychotherapisteality Testing, Socialization and Support   Summary of Progress/Problems: Pt identified obstacles faced currently and processed barriers involved in overcoming these obstacles. Pt identified steps necessary for overcoming these obstacles and explored motivation (internal and external) for facing these difficulties head on. Pt further identified one area of concern in their lives and chose a goal to focus on for today. Pt shared that the pt's primary goal on a daily basis is "taking long walks".  Pt shared that the pt's primary obstacle to this goal are family conflicts.  Pt shared the pt overcomes these obstacles by always walking despite family problems.  Pt shared the pt's primary coping mechanism is to focus on self-care first. Pt was polite and cooperative with the CSW and other group members and focused and attentive to the topics discussed and the sharing of others.   Dorothe PeaJonathan F. Sajid Ruppert, LCSWA, LCAS

## 2015-10-08 NOTE — Plan of Care (Signed)
Problem: Safety: Goal: Ability to remain free from injury will improve Outcome: Progressing Pt has remained free from injury   

## 2015-10-08 NOTE — Progress Notes (Signed)
Recreation Therapy Notes  Date: 10.02.17 Time: 9:30 am Location: Craft Room  Group Topic: Self-expression  Goal Area(s) Addresses:  Patient will draw a bottle of how they see themselves. Patient will write at least one emotion they are experiencing.  Behavioral Response: Attentive  Intervention: Bottled Up  Activity: Patients were instructed to draw a bottle of how they see themselves. Patients were instructed to write the emotions they were feeling on the inside of the bottle.  Education: LRT educated group on other forms of self-expression.  Education Outcome: In group clarification offered  Clinical Observations/Feedback: Patient completed activity by drawing a bottle of how he sees himself and writing emotions he was feeling inside the bottle. Patient did not contribute to group discussion.  [name],Paradise Vensel M, LRT/CTRS 10/08/2015 10:19 AM

## 2015-10-08 NOTE — Progress Notes (Signed)
D: Observed pt in room lying in bed. Patient alert and oriented x4. Patient denies SI/HI/AVH. Pt affect is blunted. Pt isolated to room all evening and interacted very minimally with peers and staff. Pt endorsed going to groups earlier. Pt denies depression and anxiety. Pt not observed responding to internal stimuli. A: Offered active listening and support. Provided therapeutic communication. Encouraged pt to attend group and actively participate in care.  R: Pt pleasant, calm and cooperative. Pt isolated to room all evening. Will continue Q15 min. checks. Safety maintained.
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
## 2015-10-08 NOTE — Progress Notes (Signed)
Pt denies SI/HI/AVH. Pt given discharge instructions including f/u appointments. Pt states understanding. Pt states receipt of all belongings.   

## 2015-10-08 NOTE — Progress Notes (Signed)
  [facility] :  Will you be returning to the same living situation after discharge:  No. New Placement At discharge, do you have transportation home?: Yes,    Do you have the ability to pay for your medications: Yes,     Release of information consent forms completed and in the chart;  Patient's signature needed at discharge.  Patient to Follow up at: Follow-up Information    [facility] .   Why:  Please arrive between 2-4pm on Monday October 2nd, 2017 to be admitted for long-term residentlial mental health care Contact information:        [facility] [address] [place], [place], [address] Ph: [phone] Fax: [phone] Please call before faxing to make sure the phone line is free       [facility] .   Why:  Please remember your ACTT team will visit you in your home for your hospital follow up appointment on Monday January  October 2nd between 1-5pm for medication managment and therapy   Contact information: [facility] [address] [place], [place] [address] Ph: [phone] Fax: [phone]          Next level of care provider has [facility] to [facility] Link:no  Safety Planning and Suicide Prevention discussed: Yes,     Have you used any form of tobacco in the last 30 days? (Cigarettes, Smokeless Tobacco, Cigars, and/or Pipes): Yes  Has patient been referred to the Quitline?: Patient refused referral  Patient has been referred for addiction treatment: Yes  Glennon MacSara P Anicia Leuthold, MSW, LCSW 10/08/2015, 4:20 PM

## 2015-10-08 NOTE — [facility] Suicide Risk Assessment (Signed)
[facility]   Principal Problem: Schizophrenia [facility](HCC) Discharge Diagnoses:  Patient Active Problem List   Diagnosis Date Noted  . Tobacco use disorder [F17.200] 10/04/2015  . Schizophrenia (HCC) [F20.9] 10/03/2015  . Noncompliance [Z91.19] 10/03/2015      Psychiatric Specialty Exam: ROS  Blood pressure 107/71, pulse 64, temperature 98.7 F (37.1 C), temperature source Oral, resp. rate 18, height 5\' 7"  (1.702 m), weight 61.7 kg (136 lb), SpO2 97 %.Body mass index is 21.3 kg/m.                                                       Mental Status Per Nursing Assessment::   On Admission:  Thoughts of violence towards others  Demographic Factors:  Male  Loss Factors: NA  Historical Factors: Impulsivity  Risk Reduction Factors:   Living with another person, especially a relative and Positive social support  Continued Clinical Symptoms:  Schizophrenia:   Paranoid or undifferentiated type Previous Psychiatric Diagnoses and Treatments  Cognitive Features That Contribute To Risk:  Closed-mindedness    Suicide Risk:  Minimal: No identifiable suicidal ideation.  Patients presenting with no risk factors but with morbid ruminations; may be classified as minimal risk based on the severity of the depressive symptoms  Follow-up Information    [facility] .   Why:  Please arrive between 2-4pm on Monday October 2nd, 2017 to be admitted for long-term residentlial mental health care Contact information:        [facility] [address] [place], [place], [address] Ph: [phone] Fax: [phone] Please call before faxing to make sure the phone line is free       [facility] .   Why:  Please remember your ACTT team will visit you in your home for your hospital follow up appointment on Monday January  October 2nd between 1-5pm for medication managment and therapy   Contact  information: [facility] [address] [place], [place] [address] Ph: [phone] Fax: [phone]           Jimmy FootmanHernandez-Gonzalez,  Kaysee Hergert, MD 10/08/2015, 9:27 AM

## 2015-10-08 NOTE — [place] FL2 (Signed)
  [place] MEDICAID FL2 LEVEL OF CARE SCREENING TOOL     IDENTIFICATION  Patient Name: [name] Birthdate: 07/13/1960 Sex: male Admission Date (Current Location): 10/03/2015  [place]  [place] [id number] [facility] Facility and Address:  [facility], [address], [place], [place] [address]      Provider Number: [id number]  Attending Physician Name and Address:  Barnabas HarriesAndrea Hernandez-Gonzale*  Relative Name and Phone Number:       Current Level of Care: Hospital Recommended Level of Care: Other (Comment) (Group Home) Prior Approval Number:    Date Approved/Denied:   PASRR Number:    Discharge Plan: Other (Comment) (Group Home)    Current Diagnoses: Patient Active Problem List   Diagnosis Date Noted  . Tobacco use disorder 10/04/2015  . Schizophrenia (HCC) 10/03/2015  . Noncompliance 10/03/2015    Orientation RESPIRATION BLADDER Height & Weight     Self, Time, Situation, Place  Normal Continent Weight: 136 lb (61.7 kg) Height:  5\' 7"  (170.2 cm)  BEHAVIORAL SYMPTOMS/MOOD NEUROLOGICAL BOWEL NUTRITION STATUS      Continent    AMBULATORY STATUS COMMUNICATION OF NEEDS Skin   Independent Verbally Normal                       Personal Care Assistance Level of Assistance  Bathing, Feeding, Dressing Bathing Assistance: Independent Feeding assistance: Independent Dressing Assistance: Independent     Functional Limitations Info  Sight, Hearing, Speech Sight Info: Adequate Hearing Info: Adequate Speech Info: Adequate    SPECIAL CARE FACTORS FREQUENCY                       Contractures Contractures Info: Not present    Additional Factors Info                  Current Medications (10/08/2015):  This is the current hospital active medication list Current Facility-Administered Medications  Medication Dose Route Frequency Provider Last Rate Last Dose  . acetaminophen (TYLENOL) tablet 650 mg  650 mg Oral  Q6H PRN Audery AmelJohn T Clapacs, MD      . alum & mag hydroxide-simeth (MAALOX/MYLANTA) 200-200-20 MG/5ML suspension 30 mL  30 mL Oral Q4H PRN Audery AmelJohn T Clapacs, MD      . amantadine (SYMMETREL) capsule 100 mg  100 mg Oral BID Jimmy FootmanAndrea Hernandez-Gonzalez, MD   100 mg at 10/08/15 0859  . LORazepam (ATIVAN) tablet 2 mg  2 mg Oral Q4H PRN Audery AmelJohn T Clapacs, MD      . magnesium hydroxide (MILK OF MAGNESIA) suspension 30 mL  30 mL Oral Daily PRN Audery AmelJohn T Clapacs, MD      . risperiDONE (RISPERDAL) tablet 3 mg  3 mg Oral BID Jimmy FootmanAndrea Hernandez-Gonzalez, MD   3 mg at 10/08/15 0859  . risperiDONE microspheres (RISPERDAL CONSTA) injection 25 mg  25 mg Intramuscular Q14 Days Jimmy FootmanAndrea Hernandez-Gonzalez, MD   25 mg at 10/04/15 2207   And  . risperiDONE microspheres (RISPERDAL CONSTA) injection 12.5 mg  12.5 mg Intramuscular Q14 Days Jimmy FootmanAndrea Hernandez-Gonzalez, MD   12.5 mg at 10/04/15 2205     Discharge Medications: Please see discharge summary for a list of discharge medications.  Relevant Imaging Results:  Relevant Lab Results:    Additional Information: SSN: [id number]    Glennon MacSara P Mkenzie Dotts, LCSW

## 2015-10-22 ENCOUNTER — Emergency Department
Admission: EM | Admit: 2015-10-22 | Discharge: 2015-10-24 | Disposition: A | Discharge status: Home / Self Care | Payer: Medicare Other | Attending: Emergency Medicine | Admitting: Emergency Medicine

## 2015-10-22 ENCOUNTER — Encounter: Payer: Self-pay | Admitting: *Deleted

## 2015-10-22 DIAGNOSIS — R4689 Other symptoms and signs involving appearance and behavior: Secondary | ICD-10-CM | POA: Diagnosis present

## 2015-10-22 DIAGNOSIS — F17210 Nicotine dependence, cigarettes, uncomplicated: Secondary | ICD-10-CM | POA: Insufficient documentation

## 2015-10-22 DIAGNOSIS — F203 Undifferentiated schizophrenia: Secondary | ICD-10-CM | POA: Diagnosis not present

## 2015-10-22 DIAGNOSIS — F209 Schizophrenia, unspecified: Secondary | ICD-10-CM | POA: Insufficient documentation

## 2015-10-22 DIAGNOSIS — R451 Restlessness and agitation: Secondary | ICD-10-CM

## 2015-10-22 DIAGNOSIS — F23 Brief psychotic disorder: Secondary | ICD-10-CM

## 2015-10-22 LAB — COMPREHENSIVE METABOLIC PANEL
ALBUMIN: 3.9 g/dL (ref 3.5–5.0)
ALT: 10 U/L — ABNORMAL LOW (ref 17–63)
ANION GAP: 11 (ref 5–15)
AST: 23 U/L (ref 15–41)
Alkaline Phosphatase: 72 U/L (ref 38–126)
BUN: 11 mg/dL (ref 6–20)
CALCIUM: 9 mg/dL (ref 8.9–10.3)
CHLORIDE: 108 mmol/L (ref 101–111)
CO2: 21 mmol/L — AB (ref 22–32)
Creatinine, Ser: 0.87 mg/dL (ref 0.61–1.24)
GFR calc non Af Amer: >60 mL/min (ref >60)
GLUCOSE: 115 mg/dL — AB (ref 65–99)
POTASSIUM: 3.5 mmol/L (ref 3.5–5.1)
SODIUM: 140 mmol/L (ref 135–145)
Total Bilirubin: 0.5 mg/dL (ref 0.3–1.2)
Total Protein: 7.9 g/dL (ref 6.5–8.1)

## 2015-10-22 LAB — ETHANOL: Alcohol, Ethyl (B): <5 mg/dL (ref <5)

## 2015-10-22 LAB — CBC
HEMATOCRIT: 38.1 % — AB (ref 40.0–52.0)
HEMOGLOBIN: 12.9 g/dL — AB (ref 13.0–18.0)
MCH: 32.2 pg (ref 26.0–34.0)
MCHC: 33.8 g/dL (ref 32.0–36.0)
MCV: 95.4 fL (ref 80.0–100.0)
Platelets: 245 10*3/uL (ref 150–440)
RBC: 4 MIL/uL — AB (ref 4.40–5.90)
RDW: 15.4 % — ABNORMAL HIGH (ref 11.5–14.5)
WBC: 5.9 10*3/uL (ref 3.8–10.6)

## 2015-10-22 LAB — SALICYLATE LEVEL

## 2015-10-22 LAB — ACETAMINOPHEN LEVEL

## 2015-10-22 MED ORDER — LORAZEPAM 2 MG PO TABS: 2.0000 mg | ORAL_TABLET | Freq: Once | ORAL | Status: DC

## 2015-10-22 MED ORDER — LORAZEPAM 2 MG/ML IJ SOLN
2.0000 mg | Freq: Once | INTRAMUSCULAR | Status: AC
  Administered 2015-10-22: 2 mg via INTRAMUSCULAR

## 2015-10-22 MED ORDER — ZIPRASIDONE MESYLATE 20 MG IM SOLR
20.0000 mg | Freq: Once | INTRAMUSCULAR | Status: AC
  Administered 2015-10-22: 20 mg via INTRAMUSCULAR

## 2015-10-22 MED ORDER — DIPHENHYDRAMINE HCL 50 MG/ML IJ SOLN
INTRAMUSCULAR | Status: AC
  Filled 2015-10-22: qty 1

## 2015-10-22 MED ORDER — DIPHENHYDRAMINE HCL 50 MG/ML IJ SOLN
50.0000 mg | Freq: Once | INTRAMUSCULAR | Status: AC
  Administered 2015-10-22: 50 mg via INTRAMUSCULAR

## 2015-10-22 MED ORDER — RISPERIDONE 1 MG PO TABS
3.0000 mg | ORAL_TABLET | Freq: Two times a day (BID) | ORAL | Status: DC
  Administered 2015-10-23 – 2015-10-24 (×3): 3 mg via ORAL
  Filled 2015-10-22 (×4): qty 3

## 2015-10-22 MED ORDER — AMANTADINE HCL 100 MG PO CAPS
100.0000 mg | ORAL_CAPSULE | Freq: Two times a day (BID) | ORAL | Status: DC
  Administered 2015-10-23 – 2015-10-24 (×3): 100 mg via ORAL
  Filled 2015-10-22 (×4): qty 1

## 2015-10-22 MED ORDER — LORAZEPAM 2 MG/ML IJ SOLN
INTRAMUSCULAR | Status: AC
  Administered 2015-10-22: 2 mg via INTRAMUSCULAR
  Filled 2015-10-22: qty 1

## 2015-10-22 MED ORDER — ZIPRASIDONE MESYLATE 20 MG IM SOLR
INTRAMUSCULAR | Status: AC
  Administered 2015-10-22: 20 mg via INTRAMUSCULAR
  Filled 2015-10-22: qty 20

## 2015-10-22 NOTE — ED Notes (Signed)
Dr. Roxan Hockeyobinson stated to hold off on benadryl at the moment.

## 2015-10-22 NOTE — ED Notes (Addendum)
Pt placed in soft restraints, sitter at bedside, unable to preform vitals at this time, pt too aggitated

## 2015-10-22 NOTE — ED Notes (Signed)
PT IVC PENDING CONSULT  

## 2015-10-22 NOTE — ED Notes (Signed)
Pt still yelling in room, ODS still at bedside.

## 2015-10-22 NOTE — ED Notes (Signed)
Pt removed from restraints, safety sitter at bedside

## 2015-10-22 NOTE — Progress Notes (Signed)
TTS was told not to see patient at this time Music therapist(Nurse, security) due to his aggressive behaviors.  TTS spoke with the ED doctor, and he concurred with delaying speaking with the patient would be wise at this time.

## 2015-10-22 NOTE — Progress Notes (Signed)
Attempted TTS/Assesment, and spoke with pt. Nurse, unable to do Assessment/TTS at this time pt. Too sedated. Jillyn Stacey K. Sherlon HandingHarris, LCAS-A, LPC-A, [facility]  Counselor 10/22/2015 3:18 PM

## 2015-10-22 NOTE — ED Notes (Signed)
Pt yelling at rover from room.

## 2015-10-22 NOTE — ED Notes (Signed)
Pt very agitated, screaming and yelling, screaming profanity, brought in by sheriffs depts, states they picked him up from a group home after staff states aggressive behavior, pt IVC, pt arrives in handcuffs, police at bedside, EDP brought to bedside upon pts arrival

## 2015-10-22 NOTE — ED Notes (Signed)
Pt removed from bilateral ankle restraints, pt has periods of resting in bed quietly and will then sit up and thrash and scream in bed

## 2015-10-22 NOTE — ED Notes (Signed)
Left ankle restraint removed, pt resting in bed, eyes closed, resp even and unlabored, pt will occasionally yell and thrash in bed, safety sitter at bedside
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
## 2015-10-22 NOTE — ED Notes (Signed)
Called dietary to get pt a hamburger dinner tray like the other patients in the area. Pt did not want a sandwich tray.

## 2015-10-22 NOTE — ED Notes (Signed)
Pt yelling in room, ODS at bedside. Pt voice increasing. Dr. Roxan Hockeyobinson notified.
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
## 2015-10-22 NOTE — ED Notes (Signed)
Pt given hamburger tray, stated he was feeling fine, no complaints at this time.

## 2015-10-23 DIAGNOSIS — F209 Schizophrenia, unspecified: Secondary | ICD-10-CM | POA: Diagnosis not present

## 2015-10-23 MED ORDER — LORAZEPAM 2 MG/ML IJ SOLN
INTRAMUSCULAR | Status: AC
Start: 2015-10-23 — End: 2015-10-23
  Administered 2015-10-23: 2 mg via INTRAMUSCULAR
  Filled 2015-10-23: qty 1

## 2015-10-23 MED ORDER — LORAZEPAM 2 MG/ML IJ SOLN
2.0000 mg | Freq: Once | INTRAMUSCULAR | Status: AC
  Administered 2015-10-23: 2 mg via INTRAMUSCULAR

## 2015-10-23 MED ORDER — HALOPERIDOL LACTATE 5 MG/ML IJ SOLN
5.0000 mg | Freq: Once | INTRAMUSCULAR | Status: AC
  Administered 2015-10-23: 5 mg via INTRAMUSCULAR

## 2015-10-23 MED ORDER — DIPHENHYDRAMINE HCL 50 MG/ML IJ SOLN
50.0000 mg | Freq: Once | INTRAMUSCULAR | Status: AC
  Administered 2015-10-23: 50 mg via INTRAMUSCULAR

## 2015-10-23 MED ORDER — HALOPERIDOL LACTATE 5 MG/ML IJ SOLN
INTRAMUSCULAR | Status: AC
  Filled 2015-10-23: qty 1

## 2015-10-23 MED ORDER — DIPHENHYDRAMINE HCL 50 MG/ML IJ SOLN
INTRAMUSCULAR | Status: AC
  Filled 2015-10-23: qty 1

## 2015-10-23 NOTE — ED Notes (Signed)
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
## 2015-10-23 NOTE — ED Notes (Signed)
BEHAVIORAL HEALTH ROUNDING Patient sleeping: No. Patient alert and oriented: yes Behavior appropriate: Yes.  ; If no, describe:  Nutrition and fluids offered: yes Toileting and hygiene offered: Yes  Sitter present: q15 minute observations and security  monitoring Law enforcement present: Yes  ODS  

## 2015-10-23 NOTE — ED Notes (Signed)
ED BHU PLACEMENT JUSTIFICATION Is the patient under IVC or is there intent for IVC: Yes.   Is the patient medically cleared: Yes.   Is there vacancy in the ED BHU: Yes.   Is the population mix appropriate for patient: Yes.   Is the patient awaiting placement in inpatient or outpatient setting: Yes.   inpt placement Has the patient had a psychiatric consult: Yes.   Survey of unit performed for contraband, proper placement and condition of furniture, tampering with fixtures in bathroom, shower, and each patient room: Yes.  ; Findings:  APPEARANCE/BEHAVIOR Calm and cooperative NEURO ASSESSMENT Orientation: oriented x4  Denies pain Hallucinations: No.None noted (Hallucinations) Speech: Normal Gait: normal RESPIRATORY ASSESSMENT Even  Unlabored respirations  CARDIOVASCULAR ASSESSMENT Pulses equal   regular rate  Skin warm and dry   GASTROINTESTINAL ASSESSMENT no GI complaint EXTREMITIES Full ROM  PLAN OF CARE Provide calm/safe environment. Vital signs assessed twice daily. ED BHU Assessment once each 12-hour shift. Collaborate with TTS daily or as condition indicates. Assure the ED provider has rounded once each shift. Provide and encourage hygiene. Provide redirection as needed. Assess for escalating behavior; address immediately and inform ED provider.  Assess family dynamic and appropriateness for visitation as needed: Yes.  ; If necessary, describe findings:  Educate the patient/family about BHU procedures/visitation: Yes.  ; If necessary, describe findings:   
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
## 2015-10-23 NOTE — ED Notes (Signed)
Patient went to shower in the BHU. I changed his linens and cleaned his room while he was showering.

## 2015-10-23 NOTE — ED Notes (Signed)

## 2015-10-23 NOTE — ED Notes (Signed)
Patient observed lying in bed with eyes closed  Even, unlabored respirations observed   NAD pt appears to be sleeping  I will continue to monitor along with every 15 minute visual observations and ongoing security monitoring    

## 2015-10-23 NOTE — ED Notes (Signed)
PT IVC  GOING  TO  BEH  MED  TONIGHT

## 2015-10-23 NOTE — ED Notes (Signed)
Patient increasingly agitated, yelling and cursing at staff while disrupting nighttime peace and quiet for other behavioral patients.

## 2015-10-24 DIAGNOSIS — F203 Undifferentiated schizophrenia: Secondary | ICD-10-CM | POA: Diagnosis not present

## 2015-10-24 DIAGNOSIS — F209 Schizophrenia, unspecified: Secondary | ICD-10-CM | POA: Diagnosis not present

## 2015-10-24 MED ORDER — TRAZODONE HCL 100 MG PO TABS: 100.0000 mg | ORAL_TABLET | Freq: Every day | ORAL | 1 refills | Status: DC

## 2015-10-24 MED ORDER — TRAZODONE HCL 100 MG PO TABS: 100.0000 mg | ORAL_TABLET | Freq: Every day | ORAL | Status: DC

## 2015-10-24 MED ORDER — RISPERIDONE 3 MG PO TABS: 3.0000 mg | ORAL_TABLET | Freq: Two times a day (BID) | ORAL | 1 refills | Status: DC

## 2015-10-24 MED ORDER — AMANTADINE HCL 100 MG PO CAPS: 100.0000 mg | ORAL_CAPSULE | Freq: Two times a day (BID) | ORAL | 1 refills | Status: DC
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
## 2015-10-24 NOTE — ED Notes (Signed)
BEHAVIORAL HEALTH ROUNDING Patient sleeping: No. Patient alert and oriented: yes Behavior appropriate: Yes.  ; If no, describe:  Nutrition and fluids offered: yes Toileting and hygiene offered: Yes  Sitter present: q15 minute observations and security  monitoring Law enforcement present: Yes  ODS  

## 2015-10-24 NOTE — BHH Counselor (Signed)
Referral for placement has been submitted to:  [facility]   [facility]  [facility]  [facility]  [facility] 

## 2015-10-24 NOTE — ED Notes (Signed)

## 2015-10-24 NOTE — ED Provider Notes (Addendum)
-----------------------------------------   7:06 AM on 10/24/2015 -----------------------------------------   Blood pressure (!) 158/71, pulse (!) 55, temperature 98.5 F (36.9 C), temperature source Oral, resp. rate 18, height 5\' 8"  (1.727 m), weight 140 lb (63.5 kg), SpO2 98 %.  The patient had no acute events since last update.  Calm and cooperative at this time.  Disposition is pending Psychiatry/Behavioral Medicine team recommendations.     Dustin EddyPatrick Karver Fadden, MD 10/24/15 [phone]   ----------------------------------------- 2:54 PM on 10/24/2015 -----------------------------------------  Patient has been observed in the ER over the past 2 days. After being restarted on his home medications the patient is currently calm and appropriate. No aggressive behavior. Does not appear to be actively hallucinating. Patient evaluated by psychiatry at bedside today and does not appear to require inpatient psychiatry admission at this point. Patient is stable for discharge back to group home. TTS is reached up to group home who agrees to accept patient back to their facility.  Have discussed with the patient and available family all diagnostics and treatments performed thus far and all questions were answered to the best of my ability. The patient demonstrates understanding and agreement with plan.    Dustin EddyPatrick Amorie Rentz, MD 10/24/15 1454    Dustin EddyPatrick Lorena Clearman, MD 10/24/15 [phone]

## 2015-10-24 NOTE — ED Notes (Signed)
Patient observed lying in bed with eyes closed  Even, unlabored respirations observed   NAD pt appears to be sleeping  I will continue to monitor along with every 15 minute visual observations and ongoing security monitoring    

## 2015-10-24 NOTE — ED Notes (Signed)
Am meds administered as ordered    ED BHU PLACEMENT JUSTIFICATION Is the patient under IVC or is there intent for IVC: Yes.   Is the patient medically cleared: Yes.   Is there vacancy in the ED BHU: Yes.   Is the population mix appropriate for patient: Yes.   Is the patient awaiting placement in inpatient or outpatient setting: Yes.   Has the patient had a psychiatric consult: Yes.   Survey of unit performed for contraband, proper placement and condition of furniture, tampering with fixtures in bathroom, shower, and each patient room: Yes.  ; Findings:  APPEARANCE/BEHAVIOR Calm and cooperative NEURO ASSESSMENT Orientation: oriented x 4 Denies pain Hallucinations: No.None noted (Hallucinations) Speech: Normal Gait: normal RESPIRATORY ASSESSMENT Even  Unlabored respirations  CARDIOVASCULAR ASSESSMENT Pulses equal   regular rate  Skin warm and dry   GASTROINTESTINAL ASSESSMENT no GI complaint EXTREMITIES Full ROM  PLAN OF CARE Provide calm/safe environment. Vital signs assessed twice daily. ED BHU Assessment once each 12-hour shift. Collaborate with TTS daily or as condition indicates. Assure the ED provider has rounded once each shift. Provide and encourage hygiene. Provide redirection as needed. Assess for escalating behavior; address immediately and inform ED provider.  Assess family dynamic and appropriateness for visitation as needed: Yes.  ; If necessary, describe findings:  Educate the patient/family about BHU procedures/visitation: Yes.  ; If necessary, describe findings:

## 2015-10-24 NOTE — Progress Notes (Signed)
Spoke with Quillian QuinceGarnetta Group home manager [phone] stated she could have Marlette form group home [phone] pick up pt when she is called. Quillian QuinceGarnetta also requested pt. Receive medications prior to leaving hospital to help with sleep. Bryella Diviney K. Sherlon HandingHarris, LCAS-A, LPC-A, [facility]  Counselor 10/24/2015 2:31 PM

## 2015-10-24 NOTE — Progress Notes (Signed)
Spoke with pt. [facility] [phone] and [facility]  willing to accept/take pt. Back to group home if d/c. Marlette  Stated that she will speak with her manager and TTS should call back in about 15 minutes. Dan Scearce K. Sherlon HandingHarris, LCAS-A, LPC-A, [facility]  Counselor 10/24/2015 2:11 PM

## 2015-10-28 ENCOUNTER — Emergency Department
Admission: EM | Admit: 2015-10-28 | Discharge: 2015-10-29 | Disposition: A | Discharge status: Home / Self Care | Payer: Medicare Other | Attending: Emergency Medicine | Admitting: Emergency Medicine

## 2015-10-28 ENCOUNTER — Encounter: Payer: Self-pay | Admitting: Emergency Medicine

## 2015-10-28 DIAGNOSIS — Z9119 Patient's noncompliance with other medical treatment and regimen: Secondary | ICD-10-CM

## 2015-10-28 DIAGNOSIS — F17210 Nicotine dependence, cigarettes, uncomplicated: Secondary | ICD-10-CM | POA: Diagnosis not present

## 2015-10-28 DIAGNOSIS — Z79899 Other long term (current) drug therapy: Secondary | ICD-10-CM | POA: Diagnosis not present

## 2015-10-28 DIAGNOSIS — F209 Schizophrenia, unspecified: Secondary | ICD-10-CM | POA: Insufficient documentation

## 2015-10-28 DIAGNOSIS — F203 Undifferentiated schizophrenia: Secondary | ICD-10-CM | POA: Diagnosis not present

## 2015-10-28 DIAGNOSIS — F17200 Nicotine dependence, unspecified, uncomplicated: Secondary | ICD-10-CM | POA: Diagnosis present

## 2015-10-28 DIAGNOSIS — Z046 Encounter for general psychiatric examination, requested by authority: Secondary | ICD-10-CM | POA: Diagnosis present

## 2015-10-28 DIAGNOSIS — Z91199 Patient's noncompliance with other medical treatment and regimen due to unspecified reason: Secondary | ICD-10-CM

## 2015-10-28 LAB — URINE DRUG SCREEN, QUALITATIVE (ARMC ONLY)
Amphetamines, Ur Screen: NOT DETECTED
Barbiturates, Ur Screen: NOT DETECTED
Benzodiazepine, Ur Scrn: NOT DETECTED
COCAINE METABOLITE, UR ~~LOC~~: NOT DETECTED
Cannabinoid 50 Ng, Ur ~~LOC~~: NOT DETECTED
MDMA (ECSTASY) UR SCREEN: NOT DETECTED
METHADONE SCREEN, URINE: NOT DETECTED
OPIATE, UR SCREEN: NOT DETECTED
Phencyclidine (PCP) Ur S: NOT DETECTED
TRICYCLIC, UR SCREEN: NOT DETECTED

## 2015-10-28 LAB — CBC WITH DIFFERENTIAL/PLATELET
BASOS PCT: 0 %
Basophils Absolute: 0 10*3/uL (ref 0–0.1)
EOS ABS: 0.1 10*3/uL (ref 0–0.7)
Eosinophils Relative: 1 %
HEMATOCRIT: 40.4 % (ref 40.0–52.0)
HEMOGLOBIN: 13.4 g/dL (ref 13.0–18.0)
LYMPHS ABS: 2.2 10*3/uL (ref 1.0–3.6)
Lymphocytes Relative: 22 %
MCH: 32.1 pg (ref 26.0–34.0)
MCHC: 33.2 g/dL (ref 32.0–36.0)
MCV: 96.8 fL (ref 80.0–100.0)
MONOS PCT: 8 %
Monocytes Absolute: 0.8 10*3/uL (ref 0.2–1.0)
NEUTROS ABS: 6.6 10*3/uL — AB (ref 1.4–6.5)
NEUTROS PCT: 69 %
Platelets: 220 10*3/uL (ref 150–440)
RBC: 4.18 MIL/uL — AB (ref 4.40–5.90)
RDW: 14.6 % — ABNORMAL HIGH (ref 11.5–14.5)
WBC: 9.7 10*3/uL (ref 3.8–10.6)

## 2015-10-28 LAB — URINALYSIS COMPLETE WITH MICROSCOPIC (ARMC ONLY)
BACTERIA UA: NONE SEEN
BILIRUBIN URINE: NEGATIVE
Glucose, UA: NEGATIVE mg/dL
HGB URINE DIPSTICK: NEGATIVE
Ketones, ur: NEGATIVE mg/dL
LEUKOCYTES UA: NEGATIVE
NITRITE: NEGATIVE
PH: 6 (ref 5.0–8.0)
PROTEIN: NEGATIVE mg/dL
Specific Gravity, Urine: 1.004 — ABNORMAL LOW (ref 1.005–1.030)

## 2015-10-28 LAB — ETHANOL: Alcohol, Ethyl (B): <5 mg/dL (ref <5)

## 2015-10-28 LAB — COMPREHENSIVE METABOLIC PANEL
ALBUMIN: 3.9 g/dL (ref 3.5–5.0)
ALT: 11 U/L — ABNORMAL LOW (ref 17–63)
ANION GAP: 8 (ref 5–15)
AST: 24 U/L (ref 15–41)
Alkaline Phosphatase: 69 U/L (ref 38–126)
BUN: 10 mg/dL (ref 6–20)
CALCIUM: 9 mg/dL (ref 8.9–10.3)
CO2: 25 mmol/L (ref 22–32)
Chloride: 104 mmol/L (ref 101–111)
Creatinine, Ser: 1.01 mg/dL (ref 0.61–1.24)
GFR calc non Af Amer: >60 mL/min (ref >60)
GLUCOSE: 225 mg/dL — AB (ref 65–99)
POTASSIUM: 3.7 mmol/L (ref 3.5–5.1)
SODIUM: 137 mmol/L (ref 135–145)
TOTAL PROTEIN: 7.9 g/dL (ref 6.5–8.1)
Total Bilirubin: 0.7 mg/dL (ref 0.3–1.2)

## 2015-10-28 MED ORDER — ZIPRASIDONE MESYLATE 20 MG IM SOLR
20.0000 mg | Freq: Once | INTRAMUSCULAR | Status: AC
  Administered 2015-10-28: 20 mg via INTRAMUSCULAR
  Filled 2015-10-28: qty 20

## 2015-10-28 MED ORDER — TRAZODONE HCL 100 MG PO TABS
100.0000 mg | ORAL_TABLET | Freq: Every day | ORAL | Status: DC
  Administered 2015-10-29: 100 mg via ORAL
  Filled 2015-10-28: qty 1

## 2015-10-28 MED ORDER — RISPERIDONE 1 MG PO TABS
3.0000 mg | ORAL_TABLET | Freq: Two times a day (BID) | ORAL | Status: DC
  Administered 2015-10-29 (×2): 3 mg via ORAL
  Filled 2015-10-28 (×2): qty 3

## 2015-10-28 MED ORDER — AMANTADINE HCL 100 MG PO CAPS
100.0000 mg | ORAL_CAPSULE | Freq: Two times a day (BID) | ORAL | Status: DC
  Administered 2015-10-29 (×2): 100 mg via ORAL
  Filled 2015-10-28 (×2): qty 1
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
## 2015-10-28 NOTE — ED Triage Notes (Signed)
Pt from :   [facility] [address] [place] [address]  [phone]  Contact Marlette Renette ButtersGolden
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
## 2015-10-28 NOTE — ED Notes (Signed)
Pt sleeping, cont to monitor, no obvious signs of distress noted

## 2015-10-29 ENCOUNTER — Inpatient Hospital Stay
Admission: EM | Admit: 2015-10-29 | Discharge: 2015-11-07 | DRG: 885 | Disposition: A | Discharge status: Home / Self Care | Payer: Medicare Other | Source: Intra-hospital | Attending: Psychiatry | Admitting: Psychiatry

## 2015-10-29 DIAGNOSIS — F203 Undifferentiated schizophrenia: Secondary | ICD-10-CM | POA: Diagnosis present

## 2015-10-29 DIAGNOSIS — F17200 Nicotine dependence, unspecified, uncomplicated: Secondary | ICD-10-CM | POA: Diagnosis present

## 2015-10-29 DIAGNOSIS — Z91199 Patient's noncompliance with other medical treatment and regimen due to unspecified reason: Secondary | ICD-10-CM

## 2015-10-29 DIAGNOSIS — F17210 Nicotine dependence, cigarettes, uncomplicated: Secondary | ICD-10-CM | POA: Diagnosis present

## 2015-10-29 DIAGNOSIS — Z9119 Patient's noncompliance with other medical treatment and regimen: Secondary | ICD-10-CM | POA: Diagnosis not present

## 2015-10-29 DIAGNOSIS — G4700 Insomnia, unspecified: Secondary | ICD-10-CM | POA: Diagnosis present

## 2015-10-29 DIAGNOSIS — F209 Schizophrenia, unspecified: Secondary | ICD-10-CM | POA: Diagnosis not present

## 2015-10-29 MED ORDER — AMANTADINE HCL 100 MG PO CAPS
100.0000 mg | ORAL_CAPSULE | Freq: Two times a day (BID) | ORAL | Status: DC
  Administered 2015-10-29 – 2015-11-07 (×18): 100 mg via ORAL
  Filled 2015-10-29 (×18): qty 1

## 2015-10-29 MED ORDER — MAGNESIUM HYDROXIDE 400 MG/5ML PO SUSP: 30.0000 mL | Freq: Every day | ORAL | Status: DC | PRN

## 2015-10-29 MED ORDER — AMANTADINE HCL 100 MG PO CAPS: 100.0000 mg | ORAL_CAPSULE | Freq: Two times a day (BID) | ORAL | Status: DC

## 2015-10-29 MED ORDER — ALUM & MAG HYDROXIDE-SIMETH 200-200-20 MG/5ML PO SUSP: 30.0000 mL | ORAL | Status: DC | PRN

## 2015-10-29 MED ORDER — LORAZEPAM 2 MG/ML IJ SOLN
2.0000 mg | Freq: Once | INTRAMUSCULAR | Status: AC
  Administered 2015-10-29: 2 mg via INTRAMUSCULAR
  Filled 2015-10-29: qty 1

## 2015-10-29 MED ORDER — ACETAMINOPHEN 325 MG PO TABS: 650.0000 mg | ORAL_TABLET | Freq: Four times a day (QID) | ORAL | Status: DC | PRN

## 2015-10-29 MED ORDER — RISPERIDONE 3 MG PO TABS: 3.0000 mg | ORAL_TABLET | Freq: Two times a day (BID) | ORAL | Status: DC

## 2015-10-29 MED ORDER — RISPERIDONE 3 MG PO TABS
3.0000 mg | ORAL_TABLET | Freq: Two times a day (BID) | ORAL | Status: DC
  Administered 2015-10-29 – 2015-10-31 (×4): 3 mg via ORAL
  Filled 2015-10-29 (×4): qty 1

## 2015-10-29 MED ORDER — TRAZODONE HCL 100 MG PO TABS
100.0000 mg | ORAL_TABLET | Freq: Every day | ORAL | Status: DC
  Filled 2015-10-29: qty 1

## 2015-10-29 NOTE — ED Notes (Signed)
[facility]

## 2015-10-29 NOTE — ED Notes (Signed)
QUAD Rover entered pts. Room and pt. Became verbally agitated due to his racial biases. Pt. Calmed down by this RN and now resting in bed again. MD Zenda AlpersWebster placed ativan order if needed. Will continue to monitor and access if ativan necessary.

## 2015-10-29 NOTE — Progress Notes (Signed)
[facility] by Dr. Toni Amendlapacs.  Attending Physician will be Dr. Ardyth HarpsHernandez.   Patient has been assigned to room 316[facility]B, [facility] Gwen.   ER staff is aware of the admission ( ER Sect., ER MD;Patient's Nurse &  Patient Access). Samuel Rittenhouse K. Sherlon HandingHarris, LCAS-A, LPC-A, [facility]  Counselor 10/29/2015 1:08 PM

## 2015-10-29 NOTE — ED Notes (Signed)
Pt sitting up in chair in the room drinking water, states that he is ok and states that he will soon be getting back in the bed to go back to sleep, pt states that he doesn't need anything at this time and is aware where the toilet is. No distress noted at this time, cont to monitor

## 2015-10-29 NOTE — ED Notes (Signed)
Pt. Out of room stating that he "doesn't want to be turned white" "doesn't want to be adopted by white people" "doesn't want to be in [place] anymore because your color and my color aren't the same here" Pt. Guided back into room by this RN and informed no one trying to change who he is, we treat everyone the same here. Pt. Settled back down, sitting on bench in room stating he will probably go back to sleep soon.

## 2015-10-29 NOTE — ED Notes (Addendum)
Pt stepped out in doorway, talking to officer. Asked to see nurse or Dr. Toni Amendlapacs. This RN went in to talk to pt and pt was asking about Dr. Toni Amendlapacs decided to do with him. Pt informed that this RN has not seen Dr. Toni Amendlapacs yet but he should be down shortly.

## 2015-10-29 NOTE — ED Notes (Signed)
Dr. Toni Amendlapacs informed pt wanted to see him. Dr. Toni Amendlapacs went into pt room 24 to speak to pt.

## 2015-10-29 NOTE — ED Notes (Signed)
Pt came to door again asking when Dr. Toni Amendlapacs was going to see him. Pt informed that he will be back to see him. Pt asked if Dr. Toni Amendlapacs know if he is here, pt informed that Dr. Toni Amendlapacs knows that the pt is here and will be in to see him.

## 2015-10-29 NOTE — Progress Notes (Signed)
Spoke with [name] was concerned for releasing pt. As far as continued care. Consulted with Dr. Toni Amendlapacs states would recommend pt. [facility], if not discuss further options once given answer on acceptance [facility].  Per Clapacs, concern is [facility]. [name], stated [facility]. Emer Onnen K. Sherlon HandingHarris, LCAS-A, LPC-A, [facility]  Counselor 10/29/2015 12:55 PM
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
## 2015-10-29 NOTE — ED Notes (Signed)
Patient refused breakfast but want milk and juice

## 2015-10-29 NOTE — Progress Notes (Addendum)
[facility] [phone] number listed by search. Number was busy. Checked MAR and number listed is [phone], no answer. Dustin Carranza K. Sherlon HandingHarris, LCAS-A, LPC-A, [facility]  Counselor 10/29/2015 12:34 PM

## 2015-10-29 NOTE — ED Notes (Signed)
Pt came out to door asking about if his bed downstairs was ready. Pt informed that this RN will give him updates as this RN receives them. Pt went back into room at this time.

## 2015-10-29 NOTE — Tx Team (Signed)
Initial Treatment Plan 10/29/2015 6:26 PM Dustin LankWillie C Lucy ZOX:[id number]    PATIENT STRESSORS: Medication change or noncompliance Substance abuse   PATIENT STRENGTHS: Average or above average intelligence Communication skills   PATIENT IDENTIFIED PROBLEMS: Schizophrenia 10/29/2015  Depression 10/29/2015                   DISCHARGE CRITERIA:  Adequate post-discharge living arrangements Verbal commitment to aftercare and medication compliance  PRELIMINARY DISCHARGE PLAN: Attend aftercare/continuing care group Return to previous living arrangement  PATIENT/FAMILY INVOLVEMENT: This treatment plan has been presented to and reviewed with the patient, [name], and/or family member, The patient and family have been given the opportunity to ask questions and make suggestions.  Leonarda SalonGigi George Ahana Najera, RN 10/29/2015, 6:26 PM

## 2015-10-29 NOTE — ED Notes (Signed)
Pt repeatedly asking when he will go downstairs, pt informed again that we do not a have a bed for him and don't know exactly what time it will be ready for him.

## 2015-10-29 NOTE — Progress Notes (Signed)
Patient pleasant and cooperative during admission assessment. Patient denies SI/HI at this time. Patient denies AVH. Patient informed of fall risk status, fall risk assessed "low" at this time. Patient oriented to unit/staff/room. Patient denies any questions/concerns at this time. Patient safe on unit with Q15 minute checks for safety. Skin assessment & body search done ,no contraband found. 

## 2015-10-29 NOTE — ED Notes (Signed)
Patient  On phone

## 2015-10-30 ENCOUNTER — Encounter: Payer: Self-pay | Admitting: Psychiatry

## 2015-10-30 LAB — LIPID PANEL
CHOL/HDL RATIO: 3.4 ratio
CHOLESTEROL: 186 mg/dL (ref 0–200)
HDL: 55 mg/dL (ref >40)
LDL Cholesterol: 118 mg/dL — ABNORMAL HIGH (ref 0–99)
Triglycerides: 65 mg/dL (ref <150)
VLDL: 13 mg/dL (ref 0–40)

## 2015-10-30 LAB — TSH: TSH: 0.545 u[IU]/mL (ref 0.350–4.500)

## 2015-10-30 MED ORDER — LORAZEPAM 2 MG PO TABS
2.0000 mg | ORAL_TABLET | ORAL | Status: DC | PRN
  Administered 2015-10-30 – 2015-11-01 (×2): 2 mg via ORAL
  Filled 2015-10-30 (×2): qty 1

## 2015-10-30 MED ORDER — RISPERIDONE MICROSPHERES 50 MG IM SUSR
50.0000 mg | INTRAMUSCULAR | Status: DC
  Administered 2015-10-30: 50 mg via INTRAMUSCULAR
  Filled 2015-10-30: qty 2

## 2015-10-30 MED ORDER — LORAZEPAM 2 MG/ML IJ SOLN: 2.0000 mg | INTRAMUSCULAR | Status: DC | PRN

## 2015-10-30 MED ORDER — NICOTINE 21 MG/24HR TD PT24
21.0000 mg | MEDICATED_PATCH | Freq: Every day | TRANSDERMAL | Status: DC
  Administered 2015-11-03: 21 mg via TRANSDERMAL
  Filled 2015-10-30 (×6): qty 1

## 2015-10-30 NOTE — [facility] Suicide Risk Assessment (Signed)
[facility]   Nursing information obtained from:    Demographic factors:    Current Mental Status:    Loss Factors:    Historical Factors:    Risk Reduction Factors:     Total Time spent with patient:  Principal Problem: Schizophrenia, undifferentiated (HCC) Diagnosis:   Patient Active Problem List   Diagnosis Date Noted  . Schizophrenia, undifferentiated (HCC) [F20.3] 10/29/2015  . Tobacco use disorder [F17.200] 10/04/2015  . Noncompliance [Z91.19] 10/03/2015   Subjective Data:  Continued Clinical Symptoms:  Alcohol Use Disorder Identification Test Final Score (AUDIT): 0 The "Alcohol Use Disorders Identification Test", Guidelines for Use in Primary Care, Second Edition.  World Science writerHealth Organization [facility](WHO). Score between 0-7:  no or low risk or alcohol related problems. Score between 8-15:  moderate risk of alcohol related problems. Score between 16-19:  high risk of alcohol related problems. Score 20 or above:  warrants further diagnostic evaluation for alcohol dependence and treatment.   CLINICAL FACTORS:   Severe Anxiety and/or Agitation Schizophrenia:   Paranoid or undifferentiated type Currently Psychotic Previous Psychiatric Diagnoses and Treatments   Musculoskeletal:   Psychiatric Specialty Exam: Physical Exam  ROS  Blood pressure 116/73, pulse 70, temperature 97.8 F (36.6 C), temperature source Oral, resp. rate 18, height 5\' 7"  (1.702 m), weight 60.8 kg (134 lb), SpO2 98 %.Body mass index is 20.99 kg/m.                                                    Sleep:  Number of Hours: 7.75      COGNITIVE FEATURES THAT CONTRIBUTE TO RISK:  Closed-mindedness    SUICIDE RISK:   Moderate:  Frequent suicidal ideation with limited intensity, and duration, some specificity in terms of plans, no associated intent, good self-control, limited dysphoria/symptomatology, some risk factors present, and identifiable protective  factors, including available and accessible social support.   [facility]  I certify that inpatient services furnished can reasonably be expected to improve the patient's condition.  [name],  Dustin Storlie, MD 10/30/2015, 2:40 PM

## 2015-10-30 NOTE — Plan of Care (Signed)
Problem: Coping: Goal: Ability to interact with others will improve Outcome: Not Progressing Patient has remained isolative to his room this evening.
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
## 2015-10-30 NOTE — Progress Notes (Signed)
Pt awake, alert on unit today. Noted to be constantly talking to self, sometimes others regarding "white women" and how they act out of hand. Pt appears to be very irritable, but didn't become physically aggressive at all. Requests to be called "[name], [name]" States "[place] and I hate jews!" Pt's thoughts highly disorganized. Noted to be responding to internal stimuli. Pt focused on getting in touch with "[facility]" and "[facility] [place]." Denies SI/HI/AVH. Medication complaint.   Support and encouragement provided with therapeutic communication.Medications administered as ordered with education. Safety maintained with every 15 minute checks. Will continue to monitor.

## 2015-10-30 NOTE — Progress Notes (Signed)
D: Patient has remained isolated to his room. He's heard talking to himself. Denies SI/HI/AVH at this time. Denies pain. Did not attend group or eat snack. Patient refused Trazodone for sleep.  A: No HS medications given. Encouragement provided.  R: Patient refused Trazodone for sleep. He has been calm and cooperative. Safety maintained with 15 min checks.

## 2015-10-30 NOTE — Progress Notes (Signed)
Recreation Therapy Notes  Date: 10.24.17 Time: 9:30 am Location: Craft Room  Group Topic: Goal Setting  Goal Area(s) Addresses:  Patient will write at least one goal. Patient will write at least one obstacle.  Behavioral Response: Attentive, Left early  Intervention: Recovery Goal Chart  Activity: Patients were instructed to make a Recovery Goal Chart including goals, obstacles, the date they started working on their goal, and the date they achieved their goal.  Education: LRT educated patients on healthy ways to celebrate reaching their goals.  Education Outcome: In group clarification offered   Clinical Observations/Feedback: Patient wrote goals. Patient did not contribute to group discussion. Patient left group at approximately 10:00 am with Dr. Ardyth HarpsHernandez. Patient did not return to group.  [name],Quanisha Drewry M, LRT/CTRS 10/30/2015 10:07 AM

## 2015-10-30 NOTE — [facility] Group Notes (Signed)
[facility] Group Notes:  (Nursing/MHT/Case Management/Adjunct)  Date:  10/30/2015  Time:  5:14 PM  Type of Therapy:  Psychoeducational Skills  Participation Level:  Did Not Attend  [name] 10/30/2015, 5:14 PM 

## 2015-10-30 NOTE — Plan of Care (Signed)
Problem: Safety: Goal: Ability to remain free from injury will improve Outcome: Progressing Pt free from harm this shift. Safety maintained with every 15 minute checks.

## 2015-10-30 NOTE — Plan of Care (Signed)
Problem: Safety: Goal: Periods of time without injury will increase Outcome: Progressing Patient remains free of injury during this shift.

## 2015-10-30 NOTE — [facility] Group Notes (Addendum)
Goals Group Date/Time: 10/30/2015 9:00 AM Type of Therapy and Topic: Group Therapy: Goals Group: SMART Goals   Participation Level: Moderate  Description of Group:    The purpose of a daily goals group is to assist and guide patients in setting recovery/wellness-related goals. The objective is to set goals as they relate to the crisis in which they were admitted. Patients will be using SMART goal modalities to set measurable goals. Characteristics of realistic goals will be discussed and patients will be assisted in setting and processing how one will reach their goal. Facilitator will also assist patients in applying interventions and coping skills learned in psycho-education groups to the SMART goal and process how one will achieve defined goal.   Therapeutic Goals:   -Patients will develop and document one goal related to or their crisis in which brought them into treatment.  -Patients will be guided by LCSW using SMART goal setting modality in how to set a measurable, attainable, realistic and time sensitive goal.  -Patients will process barriers in reaching goal.  -Patients will process interventions in how to overcome and successful in reaching goal.   Patient's Goal:Pt shared the pt's goal is to "Get back to my group home".   Therapeutic Modalities:  Motivational Interviewing  Research officer, political partyCognitive Behavioral Therapy  Crisis Intervention Model  SMART goals setting   Dorothe PeaJonathan F. Lowell Mcgurk, LCSWA, LCAS

## 2015-10-30 NOTE — [facility] Group Notes (Signed)
[facility] Group Notes:  (Nursing/MHT/Case Management/Adjunct)  Date:  10/30/2015  Time:  11:12 PM  Type of Therapy:  Psychoeducational Skills    Participation Level:  Did Not Attend  Participation Quality: Summary of Progress/Problems:  [name] 10/30/2015, 11:12 PM

## 2015-10-30 NOTE — Progress Notes (Signed)
D: Patient has been isolated to his room. He was quite irritable when asking about HS medication. He stated he's taken enough medication today. When asking what brought him into the hospital he stated that "they" got his file mixed up with somebody elses'. Appears paranoid. Denies SI/HI/AVH.  A: No medication given. Encouragement provided.  R: He has remained calm and cooperative. Safety maintained with 15 min checks.

## 2015-10-31 LAB — HEMOGLOBIN A1C
Hgb A1c MFr Bld: 4.8 % (ref 4.8–5.6)
Mean Plasma Glucose: 91 mg/dL

## 2015-10-31 MED ORDER — LORAZEPAM 2 MG PO TABS
2.0000 mg | ORAL_TABLET | Freq: Every day | ORAL | Status: DC
  Administered 2015-10-31 – 2015-11-04 (×4): 2 mg via ORAL
  Filled 2015-10-31 (×5): qty 1

## 2015-10-31 MED ORDER — RISPERIDONE 3 MG PO TABS
4.0000 mg | ORAL_TABLET | Freq: Two times a day (BID) | ORAL | Status: DC
  Administered 2015-10-31 – 2015-11-07 (×14): 4 mg via ORAL
  Filled 2015-10-31 (×14): qty 1

## 2015-10-31 NOTE — Progress Notes (Signed)
Recreation Therapy Notes  Date: 10.25.17 Time: 9:30 am Location: Craft Room  Group Topic: Self-esteem  Goal Area(s) Addresses:  Patient will write at least one positive trait about self. Patient will verbalize benefit of having a healthy self-esteem.  Behavioral Response: Did not attend  Intervention: I Am  Activity: Patients were given a worksheet with the letter I on it and were instructed to write as many positive traits about themselves inside the letter.  Education: LRT educated patients on ways they can increase their self-esteem.  Education Outcome: Patient did not attend group.   Clinical Observations/Feedback: Patient did not attend group.  [name],Alpheus Stiff M, LRT/CTRS 10/31/2015 9:59 AM
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
## 2015-10-31 NOTE — [facility] Group Notes (Signed)
[facility] Group Notes:  (Nursing/MHT/Case Management/Adjunct)  Date:  10/31/2015  Time:  5:47 PM  Type of Therapy:  Psychoeducational Skills  Participation Level:  Did Not Attend  Caliope Ruppert C Margarita Croke 10/31/2015, 5:47 PM 

## 2015-10-31 NOTE — Progress Notes (Signed)
D:  Patient denies SI/AVH/HI but patient is observed in the hallway talking to himself.  Patient also wants to be referred to as "Jody".  Patient appropriate in interactions with peers and staff. A:  Patient offered support and encouragement.  Patient administered scheduled medications. R:  Patient safety maintained with 15 minute checks.

## 2015-10-31 NOTE — [facility] Suicide Risk Assessment (Signed)
[facility] INPATIENT:  Family/Significant Other Suicide Prevention Education  Suicide Prevention Education:  Education Completed; [place], [place], [facility],  (name of family member/significant other) has been identified by the patient as the family member/significant other with whom the patient will be residing, and identified as the person(s) who will aid the patient in the event of a mental health crisis (suicidal ideations/suicide attempt).  With written consent from the patient, the family member/significant other has been provided the following suicide prevention education, prior to the and/or following the discharge of the patient.  The suicide prevention education provided includes the following:  Suicide risk factors  Suicide prevention and interventions  National Suicide Hotline telephone number  [facility] assessment telephone number  [facility] Emergency Assistance 911  [facility] and/or Residential Mobile Crisis Unit telephone number  Request made of family/significant other to:  Remove weapons (e.g., guns, rifles, knives), all items previously/currently identified as safety concern.    Remove drugs/medications (over-the-counter, prescriptions, illicit drugs), all items previously/currently identified as a safety concern.  The family member/significant other verbalizes understanding of the suicide prevention education information provided.  The family member/significant other agrees to remove the items of safety concern listed above.  Glennon MacSara P Saphia Vanderford, MSW, LCSW 10/31/2015, 4:36 PM

## 2015-10-31 NOTE — Tx Team (Signed)
Interdisciplinary Treatment and Diagnostic Plan Update  10/31/2015 Time of Session: 10:30am [name] MRN: [id number]  Principal Diagnosis: Schizophrenia, undifferentiated (HCC)  Secondary Diagnoses: Principal Problem:   Schizophrenia, undifferentiated (HCC) Active Problems:   Noncompliance   Tobacco use disorder   Current Medications:  Current Facility-Administered Medications  Medication Dose Route Frequency Provider Last Rate Last Dose  . acetaminophen (TYLENOL) tablet 650 mg  650 mg Oral Q6H PRN Audery AmelJohn T Clapacs, MD      . alum & mag hydroxide-simeth (MAALOX/MYLANTA) 200-200-20 MG/5ML suspension 30 mL  30 mL Oral Q4H PRN Audery AmelJohn T Clapacs, MD      . amantadine (SYMMETREL) capsule 100 mg  100 mg Oral BID Audery AmelJohn T Clapacs, MD   100 mg at 10/31/15 1738  . LORazepam (ATIVAN) tablet 2 mg  2 mg Oral Q4H PRN Jimmy FootmanAndrea Hernandez-Gonzalez, MD   2 mg at 10/30/15 2325   Or  . LORazepam (ATIVAN) injection 2 mg  2 mg Intramuscular Q4H PRN Jimmy FootmanAndrea Hernandez-Gonzalez, MD      . LORazepam (ATIVAN) tablet 2 mg  2 mg Oral QHS Jimmy FootmanAndrea Hernandez-Gonzalez, MD      . magnesium hydroxide (MILK OF MAGNESIA) suspension 30 mL  30 mL Oral Daily PRN Audery AmelJohn T Clapacs, MD      . nicotine (NICODERM CQ - dosed in mg/24 hours) patch 21 mg  21 mg Transdermal Daily Jimmy FootmanAndrea Hernandez-Gonzalez, MD      . risperiDONE (RISPERDAL) tablet 4 mg  4 mg Oral BID Jimmy FootmanAndrea Hernandez-Gonzalez, MD   4 mg at 10/31/15 1738  . risperiDONE microspheres (RISPERDAL CONSTA) injection 50 mg  50 mg Intramuscular Q14 Days Jimmy FootmanAndrea Hernandez-Gonzalez, MD   50 mg at 10/30/15 1752   PTA Medications: Prescriptions Prior to Admission  Medication Sig Dispense Refill Last Dose  . amantadine (SYMMETREL) 100 MG capsule Take 1 capsule (100 mg total) by mouth 2 (two) times daily. 60 capsule 0 Past Week at Unknown time  . amantadine (SYMMETREL) 100 MG capsule Take 1 capsule (100 mg total) by mouth 2 (two) times daily. 60 capsule 1   . risperiDONE (RISPERDAL)  3 MG tablet Take 1 tablet (3 mg total) by mouth 2 (two) times daily. 60 tablet 1   . risperiDONE microspheres (RISPERDAL CONSTA) 37.5 MG injection Inject 2 mLs (37.5 mg total) into the muscle every 14 (fourteen) days. Due on 10/12 1 each 0 Past Month at Unknown time  . traZODone (DESYREL) 100 MG tablet Take 1 tablet (100 mg total) by mouth at bedtime. 30 tablet 1     Patient Stressors: Medication change or noncompliance Substance abuse  Patient Strengths: Average or above average intelligence Communication skills  Treatment Modalities: Medication Management, Group therapy, Case management,  1 to 1 session with clinician, Psychoeducation, Recreational therapy.   Physician Treatment Plan for Primary Diagnosis: Schizophrenia, undifferentiated (HCC) Long Term Goal(s): Improvement in symptoms so as ready for discharge Improvement in symptoms so as ready for discharge   Short Term Goals: Ability to identify changes in lifestyle to reduce recurrence of condition will improve Ability to verbalize feelings will improve Ability to demonstrate self-control will improve Ability to identify and develop effective coping behaviors will improve Compliance with prescribed medications will improve Ability to identify triggers associated with substance abuse/mental health issues will improve Ability to identify changes in lifestyle to reduce recurrence of condition will improve Ability to identify and develop effective coping behaviors will improve Compliance with prescribed medications will improve  Medication Management: Evaluate patient's response, side effects,  and tolerance of medication regimen.  Therapeutic Interventions: 1 to 1 sessions, Unit Group sessions and Medication administration.  Evaluation of Outcomes: Progressing  Physician Treatment Plan for Secondary Diagnosis: Principal Problem:   Schizophrenia, undifferentiated (HCC) Active Problems:   Noncompliance   Tobacco use  disorder  Long Term Goal(s): Improvement in symptoms so as ready for discharge Improvement in symptoms so as ready for discharge   Short Term Goals: Ability to identify changes in lifestyle to reduce recurrence of condition will improve Ability to verbalize feelings will improve Ability to demonstrate self-control will improve Ability to identify and develop effective coping behaviors will improve Compliance with prescribed medications will improve Ability to identify triggers associated with substance abuse/mental health issues will improve Ability to identify changes in lifestyle to reduce recurrence of condition will improve Ability to identify and develop effective coping behaviors will improve Compliance with prescribed medications will improve     Medication Management: Evaluate patient's response, side effects, and tolerance of medication regimen.  Therapeutic Interventions: 1 to 1 sessions, Unit Group sessions and Medication administration.  Evaluation of Outcomes: Progressing   RN Treatment Plan for Primary Diagnosis: Schizophrenia, undifferentiated (HCC) Long Term Goal(s): Knowledge of disease and therapeutic regimen to maintain health will improve  Short Term Goals: Ability to verbalize frustration and anger appropriately will improve, Ability to demonstrate self-control, Ability to verbalize feelings will improve and Ability to identify and develop effective coping behaviors will improve  Medication Management: RN will administer medications as ordered by provider, will assess and evaluate patient's response and provide education to patient for prescribed medication. RN will report any adverse and/or side effects to prescribing provider.  Therapeutic Interventions: 1 on 1 counseling sessions, Psychoeducation, Medication administration, Evaluate responses to treatment, Monitor vital signs and CBGs as ordered, Perform/monitor CIWA, COWS, AIMS and Fall Risk screenings as  ordered, Perform wound care treatments as ordered.  Evaluation of Outcomes: Progressing   LCSW Treatment Plan for Primary Diagnosis: Schizophrenia, undifferentiated (HCC) Long Term Goal(s): Safe transition to appropriate next level of care at discharge, Engage patient in therapeutic group addressing interpersonal concerns.  Short Term Goals: Engage patient in aftercare planning with referrals and resources, Increase ability to appropriately verbalize feelings, Facilitate acceptance of mental health diagnosis and concerns, Identify triggers associated with mental health/substance abuse issues and Increase skills for wellness and recovery  Therapeutic Interventions: Assess for all discharge needs, 1 to 1 time with Social worker, Explore available resources and support systems, Assess for adequacy in community support network, Educate family and significant other(s) on suicide prevention, Complete Psychosocial Assessment, Interpersonal group therapy.  Evaluation of Outcomes: Progressing   Progress in Treatment: Attending groups: Yes. Participating in groups: Yes. Taking medication as prescribed: Yes. Toleration medication: Yes. Family/Significant other contact made: Yes, individual(s) contacted:   Quillian Quince  at [facility] Patient understands diagnosis: Yes. Discussing patient identified problems/goals with staff: Yes. Medical problems stabilized or resolved: Yes. Denies suicidal/homicidal ideation: Yes. Issues/concerns per patient self-inventory: Yes. Other:    New problem(s) identified: No, Describe:     New Short Term/Long Term Goal(s):  Discharge Plan or Barriers: Discharge back to [facility] and follow [facility] with [facility]  Reason for Continuation of Hospitalization: Delusions  Hallucinations Medication stabilization  Estimated Length of Stay: 3-5 days  Attendees: Patient:[name] 10/31/2015 5:38 PM  Physician: Radene Journey, MD 10/31/2015  5:38 PM  Nursing: Leonia Reader, RN 10/31/2015 5:38 PM  RN Care Manager: 10/31/2015 5:38 PM  Social Worker: [name],  LCSW 10/31/2015 5:38 PM  Recreational Therapist: Hershal Coria, LRT 10/31/2015 5:38 PM  Other:  10/31/2015 5:38 PM  Other:  10/31/2015 5:38 PM  Other: 10/31/2015 5:38 PM    Scribe for Treatment Team: Glennon Mac, LCSW 10/31/2015 5:38 PM

## 2015-10-31 NOTE — [facility] Group Notes (Signed)
[facility] LCSW Group Therapy   10/30/2015 1pm *Late Entry  Type of Therapy: Group Therapy   Participation Level: Active   Participation Quality: Attentive, Sharing and Supportive   Affect: Appropriate  Cognitive: Alert and Oriented   Insight: Developing/Improving and Engaged   Engagement in Therapy: Developing/Improving and Engaged   Modes of Intervention: Clarification, Confrontation, Discussion, Education, Exploration,  Limit-setting, Orientation, Problem-solving, Rapport Building, Dance movement psychotherapisteality Testing, Socialization and Support  Summary of Progress/Problems: The topic for group therapy was feelings about diagnosis. Pt actively participated in group discussion on their past and current diagnosis and how they feel towards this. Pt also identified how society and family members judge them, based on their diagnosis as well as stereotypes and stigmas. Pt shared that the pt was diagnosed as schizophrenic.  Pt shared the pt agrees with the pt's diagnosis.  Pt shared the pt feels "judged" by others, as a result of the pt's diagnosis, but that this not a barrier to the pt's recovery. Pt was unable to articulate further thoughts and feelings as his words became slurred and pt presented with pressure speech and disorganized speech.  Pt was however, polite and cooperative with the CSW and other group members, and focused and attentive to the topics discussed and the sharing of others.     Dorothe PeaJonathan F. Reta Norgren, LCSWA, LCAS

## 2015-10-31 NOTE — [facility] Group Notes (Signed)
[facility] Group Notes:  (Nursing/MHT/Case Management/Adjunct)  Date:  10/31/2015  Time:  9:32 PM  Type of Therapy:    Participation Level:    Participation Quality: Summary of Progress/Problems:  Mayra NeerJackie L Jacorion Klem 10/31/2015, 9:32 PM
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
## 2015-11-01 NOTE — [facility] Group Notes (Signed)
[facility] LCSW Group Therapy   11/01/2015  91pm  Type of Therapy: Group Therapy   Participation Level: Did Not Attend. Patient invited to participate but declined.    Dorothe PeaJonathan F. Dana Dorner, MSW, LCSWA, LCAS

## 2015-11-01 NOTE — [facility] Group Notes (Signed)
Goals Group  Date/Time: 11/01/2015 9am  Type of Therapy and Topic: Group Therapy: Goals Group: SMART Goals   Pt was called, but did not attend   Kylah Maresh F. Saranya Harlin, LCSWA, LCAS  
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
## 2015-11-01 NOTE — Progress Notes (Signed)
Nursing Progress Note: 7a-7p D: Pt currently presents with a labile/angry affect and angry/cooperative behavior. Pt reports to writer that their goal is to "understand what is going on with me." Pt states "nothing can help me because I'm [place]. Germans can't be helped." Pt reports "okay" sleep with current medication regimen.   A: Pt provided with medications per providers orders. Pt's labs and vitals were monitored throughout the day. Pt supported emotionally and encouraged to express concerns and questions. Pt educated on medications.  R: Pt's safety ensured with 15 minute and environmental checks. Pt currently denies SI/HI/Self Harm and A/V hallucinations. Pt verbally agrees to seek staff if SI/HI or A/VH occurs and to consult with staff before acting on any harmful thoughts. Will continue POC.

## 2015-11-01 NOTE — [facility] Group Notes (Signed)
[facility] Group Notes:  (Nursing/MHT/Case Management/Adjunct)  Date:  11/01/2015  Time:  4:18 PM  Type of Therapy:  Psychoeducational Skills  Participation Level:  Did Not Attend  Twanna Hymanda C Karine Garn 11/01/2015, 4:18 PM

## 2015-11-01 NOTE — Plan of Care (Signed)
Problem: Coping: Goal: Ability to verbalize frustrations and anger appropriately will improve Outcome: Not Progressing Pt will lash out at staff yelling angrily without any provocation.

## 2015-11-01 NOTE — Progress Notes (Signed)
Recreation Therapy Notes  Date: 10.26.17 Time: 9:30 am Location: Craft Room  Group Topic: Leisure Education  Goal Area(s) Addresses:  Patient will identify activities for each letter of the alphabet. Patient will verbalize ability to integrate positive leisure into life post d/c. Patient will verbalize ability to use leisure as a Associate Professorcoping skill.  Behavioral Response: Attentive  Intervention: Leisure Alphabet  Activity: Patients were given a Leisure Information systems managerAlphabet worksheet and were instructed to write healthy leisure activities for each letter of the alphabet.  Education: LRT educated patients on what they need to participate in leisure.  Education Outcome: In group clarification offered  Clinical Observations/Feedback: Patient completed activity by writing healthy leisure activities. Patient did not contribute to group discussion.  [name],Izaias Krupka M, LRT/CTRS 11/01/2015 10:17 AM

## 2015-11-01 NOTE — Progress Notes (Signed)
D: Observed pt in room lying in bed. Patient alert. Writer unable to assess orientation, as pt would not answer those questions Patient denies SI/HI/AVH. Pt affect is labile and angry. When writer first approached pt at beginning of shift pt was pleasant and had no issues. Later when medication was to be given, pt had an angry outburst. Pt yelled "I can't stand all these white people" and various other statements directed at not liking white people. Pt also stated other nonsensical things angrily, and at one time mentioned being from [place]. Pt insisits that he be called Luisa HartPatrick or Aurelio BrashJoey because Ivar DrapeWillie is a "white name." Pt made a mention to writer that "...you'll be in the grave." Pt was not physically aggressive. A: Offered active listening and support. Provided therapeutic communication. Administered scheduled medications.  R: Pt irritabls and labile. Pt medication compliant, after having his outburst. Pt fell asleep shortly after medication administration. Will continue Q15 min. checks. Safety maintained.

## 2015-11-02 NOTE — Progress Notes (Signed)
D: Patient currently in bed sleeping, eyes closed, respirations within normal limits. No sign of discomfort. A: Staff continue to monitor per unit protocol R: Patient's safety maintained

## 2015-11-02 NOTE — Plan of Care (Signed)
Problem: Coping: Goal: Demonstration of participation in decision-making regarding own care will improve Outcome: Progressing Continue to exhibit poor judgement and poor self care ability

## 2015-11-02 NOTE — [facility] Group Notes (Signed)
[facility] Group Notes:  (Nursing/MHT/Case Management/Adjunct)  Date:  11/02/2015  Time:  1:42 AM  Type of Therapy:  Psychoeducational Skills  Participation Level:  Did Not Attend  Summary of Progress/Problems:  Dustin MilroyLaquanda Y Ayat [name] 11/02/2015, 1:42 AM

## 2015-11-02 NOTE — Progress Notes (Signed)
D:  Denies SI/HI/AVH.  Verbalizes that his name is Energy managerJody.  Speech is tangential.  Delusional at times.  Visible in the milieu.  No interaction noted with peers.  No outburst this shift.  A:  Scheduled medications given.  Support and encouragement offered. R:  Safety maintained.

## 2015-11-02 NOTE — [facility] Group Notes (Signed)
[facility] LCSW Group Therapy   11/02/2015  1 PM  Type of Therapy: Group Therapy   Participation Level: Did Not Attend. Patient invited to participate but declined.    Dorothe PeaJonathan F. Reiss Mowrey, MSW, LCSWA, LCAS

## 2015-11-02 NOTE — Progress Notes (Signed)
D: Patient was irritable refusing to take his medication "I had too many medications and all I do is sleep.[name].ibuprofen am not taking anymore medications..." Presented to the medication room later and requested his medication. Displaying bizarre behavior and disorganized thinking A: received his bed time medication. Emotional support provided. Was encouraged to express his concerns as needed. Returned to bed not wanting to talk. R: Patient's safety maintained per unit protocol.

## 2015-11-02 NOTE — Progress Notes (Signed)
D: Pt denies SI/HI/AVH, affect is flat and sad, mood is hostile and patient is unwillingly to participate in treatment plan. Pt  is not interacting with peers and staff appropriately.  A: Pt was offered support and encouragement. Pt was given scheduled medications. Pt was encouraged to attend groups. Q 15 minute checks were done for safety.  R:Pt did not attend evening group.Pt is not receptive to treatment. Safety maintained on unit.

## 2015-11-02 NOTE — Plan of Care (Signed)
Problem: Coping: Goal: Ability to verbalize frustrations and anger appropriately will improve Outcome: Progressing No aggressive behavior displayed. Calm and cooperative

## 2015-11-02 NOTE — Plan of Care (Signed)
Problem: Coping: Goal: Ability to verbalize frustrations and anger appropriately will improve Outcome: Progressing Patient able to verbalize his needs and concerns appropriately. Cooperative. Interaction with staff and peers improving.

## 2015-11-02 NOTE — Progress Notes (Signed)
Recreation Therapy Notes  Date: 10.27.17 Time: 9:30 am Location: Craft Room  Group Topic: Coping Skills  Goal Area(s) Addresses:  Patient will identify things they are grateful for. Patient will identify how being grateful can influence decision making.  Behavioral Response: Attentive, Interactive  Intervention: Grateful Wheel  Activity: Patients were given an I Am Grateful For worksheet and instructed to write 2-3 things they were grateful for under each category.  Education: LRT educated patients on why it is important to be grateful.  Education Outcome: In group clarification offered   Clinical Observations/Feedback: Patient wrote things he was grateful for. Patient contributed to group discussion by stating something he was grateful for.  [name],Oma Marzan M, LRT/CTRS 11/02/2015 10:03 AM

## 2015-11-02 NOTE — Plan of Care (Signed)
Problem: Coping: Goal: Ability to interact with others will improve Outcome: Not Progressing Patient easily irritated and will start complaining that it is to many white people
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
## 2015-11-03 NOTE — [facility] Group Notes (Signed)
[facility] Group Notes:  (Nursing/MHT/Case Management/Adjunct)  Date:  11/03/2015  Time:  5:38 AM  Type of Therapy:  Psychoeducational Skills  Participation Level:  Did Not Attend   Summary of Progress/Problems:  Dustin MilroyLaquanda Y Doxie [name] 11/03/2015, 5:38 AM

## 2015-11-03 NOTE — Plan of Care (Signed)
Problem: Coping: Goal: Ability to demonstrate self-control will improve Outcome: Not Progressing Angry, irritable, guarded and paranoid. Refusing to attend groups.
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
## 2015-11-03 NOTE — Plan of Care (Signed)
Problem: Activity: Goal: Will verbalize the importance of balancing activity with adequate rest periods Outcome: Not Progressing Patient not participating in unit activities. Has poor insight

## 2015-11-03 NOTE — [facility] Group Notes (Signed)
[facility] LCSW Group Therapy  11/03/2015 2:29 PM  Type of Therapy:  Group Therapy  Participation Level:  Patient did not attend group. CSW invited patient to group.   Summary of Progress/Problems:Coping Skills: Patients defined and discussed healthy coping skills. Patients identified healthy coping skills they would like to try during hospitalization and after discharge. CSW offered insight to varying coping skills that may have been new to patients such as practicing mindfulness.  [name] MSW, LCSWA 11/03/2015, 2:29 PM

## 2015-11-03 NOTE — Progress Notes (Signed)
Pt has been pleasant and cooperative. No inappropriate behaviors noted. Pt denies SI and A/V hallucinations. Pt has been med compliant. Pt has not attended unit activities. Limited interactions noted with both staff and peers.

## 2015-11-04 NOTE — Progress Notes (Signed)
Patient was in room at the beginning of this shift. Was in bed, awake. Became agitated when asked to come to the medication room. Delusional, guarded and disorganized. Thinks that one of the nurses is his mother. "I am tired of them pills, calling me every minute and giving me pills...yall want to make me sleep". Patient refused a snack saying "i will never eat your snack I am fine, I am not hungry...". Patient took his medications per encouragements. Paced in the hallway for a while. Returned to his room and has been sleeping. Safety precautions reinforced.
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
## 2015-11-04 NOTE — [facility] Group Notes (Signed)
[facility] Group Notes:  (Nursing/MHT/Case Management/Adjunct)  Date:  11/04/2015  Time:  1:10 AM  Type of Therapy:  Group Therapy  Participation Level:  Did Not Attend   [name] 11/04/2015, 1:10 AM

## 2015-11-04 NOTE — Progress Notes (Signed)
Care of patient taken over at 3:00am, patient appeared to be in bed resting quietly. He got up one time around 4:00am to look at clock and returned back to bed. No issues to report on shift at this time.

## 2015-11-04 NOTE — [facility] Group Notes (Signed)
[facility] LCSW Group Therapy  11/04/2015 2:29 PM  Type of Therapy:  Group Therapy  Participation Level:  None, Patient came to group but left shortly after. Patient did not engage with CSW or peers.   Summary of Progress/Problems:Self esteem: Patients discussed self esteem and how it impacts them. They discussed what aspects in their lives has influenced their self esteem. They were challenged to identify changes that are needed in order to improve self esteem. Patients participated in activity where they had to identify positive adjectives they felt described their personality. Patients shared with the group on the following areas: Things I am good at, What I like about my appearance, I've helped others by, What I value the most, compliments I have received, challenges I have overcome, thing that make me unique, and Times I've made others happy.    Meghann [place]ing G. Garnette CzechSampson MSW, LCSWA 11/04/2015, 2:29 PM

## 2015-11-04 NOTE — Progress Notes (Signed)
Pt has been pleasant and cooperative. No inappropriate behaviors noted. Pt denies SI and A/V hallucinations. Pt has been med compliant. Pt has not attended unit activities. Limited interactions noted with both staff and peers. Pt talked about being discharged tomorrow and how happy he will be.

## 2015-11-05 DIAGNOSIS — F203 Undifferentiated schizophrenia: Principal | ICD-10-CM

## 2015-11-05 MED ORDER — LORAZEPAM 1 MG PO TABS: 1.0000 mg | ORAL_TABLET | Freq: Every day | ORAL | Status: DC

## 2015-11-05 NOTE — Progress Notes (Signed)
D: Observed pt in room lying in bed. Patient alert and oriented x4. Patient denies SI/HI/AVH. Pt affect is angry and irritable. When writer walked into room pt started going off about living in [place], and stated angrily "I don't liker ni**ers or crackers." Pt went on to make a few morse statements about not like white or black people. Pt ended conversation very quickly stating "I don't want to talk anymore." Pt also got very irritable a few times when MHT's were doing rounds. Pt can still be heard and seen talking and cursing to himself in his room. A: Offered active listening and support. Provided therapeutic communication. Administered scheduled medications.  R: Pt irritable and not participating in care. Pt is medication compliant. Will continue Q15 min. checks. Safety maintained.

## 2015-11-05 NOTE — [facility] Group Notes (Signed)
[facility] Group Notes:  (Nursing/MHT/Case Management/Adjunct)  Date:  11/05/2015  Time:  9:59 PM  Type of Therapy:  Evening Wrap-up Group  Participation Level:  Did Not Attend  Participation Quality:  N/A  Affect:  N/A  Cognitive:  N/A  Insight:  None  Engagement in Group:  Did Not Attend  Modes of Intervention:  Discussion  Summary of Progress/Problems:  Tomasita MorrowChelsea Nanta Minka Knight 11/05/2015, 9:59 PM

## 2015-11-05 NOTE — Progress Notes (Signed)
Patient is irritable & verbally aggressive states "I don;t like white ladies.I don't like this shit food.Don't make me to eat this."Talking to himself.His mood is labile.Visible in the milieu.Compliant with medications.Did not attend groups.Support & encouragement given.Safety maintained.
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
## 2015-11-05 NOTE — [facility] Group Notes (Signed)
[facility] Group Notes:  (Nursing/MHT/Case Management/Adjunct)  Date:  11/05/2015  Time:  4:53 AM  Type of Therapy:  Psychoeducational Skills  Participation Level:  Did Not Attend  Summary of Progress/Problems:  Dustin MilroyLaquanda Y Jarquavious [name] 11/05/2015, 4:53 AM

## 2015-11-05 NOTE — Progress Notes (Signed)
Recreation Therapy Notes  Date: 10.30.17 Time: 9:30 am Location: Craft Room  Group Topic: Self-expression  Goal Area(s) Addresses:  Patient will write at least one emotion they are experiencing. Patient will verbalize benefit of using art as a form of self-expression  Behavioral Response: Attentive, Left early  Intervention: Bottled Up  Activity: Patients were instructed to draw a bottle the way they see themselves. Patients were told to write the emotions they were feeling inside the bottle.  Education: LRT educated patients on other forms of self-expression.  Education Outcome: Patient left before LRT educated group.  Clinical Observations/Feedback: Patient drew a bottle and wrote the emotions he was feeling inside the bottle. Patient did not contribute to group discussion. Patient left group at approximately 9:55 am. Patient did not return to group.  [name],Wallace Cogliano M, LRT/CTRS 11/05/2015 10:11 AM

## 2015-11-05 NOTE — [facility] Group Notes (Signed)
[facility] Group Notes:  (Nursing/MHT/Case Management/Adjunct)  Date:  11/05/2015  Time:  5:22 PM  Type of Therapy:  Psychoeducational Skills  Participation Level:  None  Participation Quality:  Inattentive  Affect:  Irritable  Cognitive:  Oriented  Insight:  Limited  Engagement in Group:  None  Modes of Intervention:  Discussion and Education  Summary of Progress/Problems:  [name] 11/05/2015, 5:22 PM

## 2015-11-05 NOTE — Plan of Care (Signed)
Problem: Activity: Goal: Interest or engagement in activities will improve Outcome: Not Progressing Pt isolated to room all evening.   

## 2015-11-06 MED ORDER — LORAZEPAM 2 MG PO TABS: 2.0000 mg | ORAL_TABLET | Freq: Once | ORAL | Status: DC | PRN

## 2015-11-06 MED ORDER — LORAZEPAM 2 MG PO TABS
2.0000 mg | ORAL_TABLET | Freq: Once | ORAL | Status: AC | PRN
Start: 2015-11-06 — End: 2015-11-06
  Administered 2015-11-06: 2 mg via ORAL
  Filled 2015-11-06: qty 1

## 2015-11-06 MED ORDER — LORAZEPAM 2 MG PO TABS
2.0000 mg | ORAL_TABLET | Freq: Every day | ORAL | Status: DC
  Administered 2015-11-06: 2 mg via ORAL
  Filled 2015-11-06 (×2): qty 1

## 2015-11-06 MED ORDER — LORAZEPAM 2 MG PO TABS: 2.0000 mg | ORAL_TABLET | Freq: Every day | ORAL | Status: DC

## 2015-11-06 MED ORDER — LORAZEPAM 2 MG PO TABS
2.0000 mg | ORAL_TABLET | Freq: Three times a day (TID) | ORAL | Status: DC | PRN
  Administered 2015-11-06: 2 mg via ORAL
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
## 2015-11-06 NOTE — Plan of Care (Signed)
Problem: Safety: Goal: Ability to remain free from injury will improve Outcome: Progressing Patient has remained free from injury during this shift.   

## 2015-11-06 NOTE — Progress Notes (Signed)
Patient is pleasant & cooperative.Appropriate with staff & peers.Denies suicidal or homicidal ideations and AV hallucinations.Compliant with medications.Looking forward for discharge tomorrow.

## 2015-11-06 NOTE — Progress Notes (Signed)
Patient aggravated and agitated throughout evening. MD notified. Order for Ativan 2mg  x1 and repeat within 1 hour if needed. Patient still agitated and verbally aggressive with staff.

## 2015-11-06 NOTE — [facility] Group Notes (Signed)
[facility] LCSW Group Therapy Note  Date/Time 11/06/2015 3:00pm  Type of Therapy/Topic:  Group Therapy:  Feelings about Diagnosis  Participation Level:  Minimal   Mood:Good mood    Description of Group:    This group will allow patients to explore their thoughts and feelings about diagnoses they have received. Patients will be guided to explore their level of understanding and acceptance of these diagnoses. Facilitator will encourage patients to process their thoughts and feelings about the reactions of others to their diagnosis, and will guide patients in identifying ways to discuss their diagnosis with significant others in their lives. This group will be process-oriented, with patients participating in exploration of their own experiences as well as giving and receiving support and challenge from other group members.   Therapeutic Goals: 1. Patient will demonstrate understanding of diagnosis as evidence by identifying two or more symptoms of the disorder:  2. Patient will be able to express two feelings regarding the diagnosis 3. Patient will demonstrate ability to communicate their needs through discussion and/or role plays  Summary of Patient Progress:  Pt able to achieve therapeutic goals. Appropriate and anticipating discharge tomorrow.  Therapeutic Modalities:   Cognitive Behavioral Therapy Brief Therapy Feelings Identification   Jake SharkSara Cyrstal Leitz, MSW, LCSW

## 2015-11-06 NOTE — Progress Notes (Signed)
Recreation Therapy Notes  Date: 10.31.17 Time: 9:30 am Location: Craft Room  Group Topic: Communication  Goal Area(s) Addresses:  Patient will communicate effectively with peer. Patient will verbalize the importance of having healthy communication skills.  Behavioral Response: Attentive, Interactive, Left early  Intervention: Blind drawing  Activity: Patients were paired up. One patient was given a picture and was instructed to tell the other patient how to draw the picture.   Education: LRT educated patients on healthy communication skills.  Education Outcome: In group clarification offered  Clinical Observations/Feedback: Patient attempted to draw when peer gave instructions. Patient gave instructions for peer. Patient did not contribute to group discussion. Patient left group at approximately 10:05 am and did not return to group.  [name],Lizette Pazos M, LRT/CTRS 11/06/2015 10:22 AM

## 2015-11-07 MED ORDER — RISPERIDONE 4 MG PO TABS: 4.0000 mg | ORAL_TABLET | Freq: Two times a day (BID) | ORAL | 0 refills | Status: DC

## 2015-11-07 MED ORDER — LORAZEPAM 2 MG PO TABS: 2.0000 mg | ORAL_TABLET | Freq: Every day | ORAL | 0 refills | Status: DC

## 2015-11-07 MED ORDER — RISPERIDONE MICROSPHERES 50 MG IM SUSR: 50.0000 mg | INTRAMUSCULAR | 0 refills | Status: DC

## 2015-11-07 NOTE — Progress Notes (Signed)
Pt discharged home. DC instructions provided and explained. Medications reviewed. Rx given. All questions answered. Belongings returned. Pt denies SI, HI, AVH. Stable at discharge.
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
## 2015-11-07 NOTE — [facility] Suicide Risk Assessment (Signed)
[facility]   Principal Problem: Schizophrenia, undifferentiated ([facility]) Discharge Diagnoses:  Patient Active Problem List   Diagnosis Date Noted  . Schizophrenia, undifferentiated ([facility]) [F20.3] 10/29/2015  . Tobacco use disorder [F17.200] 10/04/2015  . Noncompliance [Z91.19] 10/03/2015      Psychiatric Specialty Exam: ROS  Blood pressure 103/62, pulse (!) 56, temperature 98 F (36.7 C), temperature source Oral, resp. rate 18, height 5\' 7"  (1.702 m), weight 60.8 kg (134 lb), SpO2 98 %.Body mass index is 20.99 kg/m.                                                       Mental Status Per Nursing Assessment::   On Admission:     Demographic Factors:  Male  Loss Factors: NA  Historical Factors: Impulsivity  Risk Reduction Factors:   Living with another person, especially a relative and Positive social support  Continued Clinical Symptoms:  Schizophrenia:   Paranoid or undifferentiated type Previous Psychiatric Diagnoses and Treatments  Cognitive Features That Contribute To Risk:  Closed-mindedness    Suicide Risk:  Minimal: No identifiable suicidal ideation.  Patients presenting with no risk factors but with morbid ruminations; may be classified as minimal risk based on the severity of the depressive symptoms  Follow-up Information    [facility].   Why:  Please arrive to your follow-up appointment with [facility] at the office at 10:00AM. It is important to bring your discharge paperwork to this appointment and arrive 15 minutes early for prompt services. Contact information: Address: [address] [place], [place] [address] Phone: [phone](336) [phone] Fax: [phone]          Jimmy FootmanHernandez-Gonzalez,  Andrey Mccaskill, MD 11/07/2015, 5:05 AM

## 2015-11-07 NOTE — Progress Notes (Signed)
  [facility] :  Will you be returning to the same living situation after discharge:  Yes,  family care home. At discharge, do you have transportation home?: Yes,  group home director. Do you have the ability to pay for your medications: Yes,  [facility].  Release of information consent forms completed and in the chart;  Patient's signature needed at discharge.  Patient to Follow up at: Follow-up Information    [facility].   Why:  Please arrive to your follow-up appointment with [facility] at the office at 10:00AM. It is important to bring your discharge paperwork to this appointment and arrive 15 minutes early for prompt services. Contact information: Address: [address] [place], [place] [address] Phone: [phone](336) [phone] Fax: [phone]          Next level of care provider has access to [facility] Link:no  Safety Planning and Suicide Prevention discussed: Yes,  SPE completed with patient and group home.  Have you used any form of tobacco in the last 30 days? (Cigarettes, Smokeless Tobacco, Cigars, and/or Pipes): Yes  Has patient been referred to the Quitline?: Patient refused referral  Patient has been referred for addiction treatment: N/A  Lynden OxfordKadijah R Crystian Frith, MSW, LCSW-A 11/07/2015, 10:54 AM

## 2015-11-07 NOTE — Progress Notes (Signed)
Recreation Therapy Notes  Date: 11.01.17 Time: 9:30 am Location: Craft Room  Group Topic: Self-esteem  Goal Area(s) Addresses:  Patient will write at least one positive trait about self. Patient will verbalize benefit of having self-esteem.  Behavioral Response: Attentive  Intervention: I Am  Activity: Patients were given a worksheet with the letter I on it and instructed to write as many positive traits inside the letter.  Education: LRT educated patients on ways to increase their self-esteem.  Education Outcome: In group clarification offered  Clinical Observations/Feedback: Patient wrote positive traits about self. Patient did not contribute to group discussion.  [name],Darren Caldron M, LRT/CTRS 11/07/2015 10:11 AM

## 2015-11-07 NOTE — Tx Team (Signed)
Interdisciplinary Treatment and Diagnostic Plan Update  11/07/2015 Time of Session: 10:30am Dustin [name] MRN: [id number]  Principal Diagnosis: Schizophrenia, undifferentiated (HCC)  Secondary Diagnoses: Principal Problem:   Schizophrenia, undifferentiated (HCC) Active Problems:   Noncompliance   Tobacco use disorder   Current Medications:  Current Facility-Administered Medications  Medication Dose Route Frequency Provider Last Rate Last Dose  . acetaminophen (TYLENOL) tablet 650 mg  650 mg Oral Q6H PRN Audery AmelJohn T Clapacs, MD      . alum & mag hydroxide-simeth (MAALOX/MYLANTA) 200-200-20 MG/5ML suspension 30 mL  30 mL Oral Q4H PRN Audery AmelJohn T Clapacs, MD      . amantadine (SYMMETREL) capsule 100 mg  100 mg Oral BID Audery AmelJohn T Clapacs, MD   100 mg at 11/07/15 0815  . LORazepam (ATIVAN) tablet 2 mg  2 mg Oral QHS Jimmy FootmanAndrea Hernandez-Gonzalez, MD   2 mg at 11/06/15 1741  . LORazepam (ATIVAN) tablet 2 mg  2 mg Oral TID PRN Jimmy FootmanAndrea Hernandez-Gonzalez, MD   2 mg at 11/06/15 2142  . magnesium hydroxide (MILK OF MAGNESIA) suspension 30 mL  30 mL Oral Daily PRN Audery AmelJohn T Clapacs, MD      . nicotine (NICODERM CQ - dosed in mg/24 hours) patch 21 mg  21 mg Transdermal Daily Jimmy FootmanAndrea Hernandez-Gonzalez, MD   21 mg at 11/03/15 [id number]  . risperiDONE (RISPERDAL) tablet 4 mg  4 mg Oral BID Jimmy FootmanAndrea Hernandez-Gonzalez, MD   4 mg at 11/07/15 0815  . risperiDONE microspheres (RISPERDAL CONSTA) injection 50 mg  50 mg Intramuscular Q14 Days Jimmy FootmanAndrea Hernandez-Gonzalez, MD   50 mg at 10/30/15 1752   PTA Medications: Prescriptions Prior to Admission  Medication Sig Dispense Refill Last Dose  . amantadine (SYMMETREL) 100 MG capsule Take 1 capsule (100 mg total) by mouth 2 (two) times daily. 60 capsule 0 Past Week at Unknown time  . amantadine (SYMMETREL) 100 MG capsule Take 1 capsule (100 mg total) by mouth 2 (two) times daily. 60 capsule 1   . risperiDONE (RISPERDAL) 3 MG tablet Take 1 tablet (3 mg total) by mouth 2 (two) times  daily. 60 tablet 1   . risperiDONE microspheres (RISPERDAL CONSTA) 37.5 MG injection Inject 2 mLs (37.5 mg total) into the muscle every 14 (fourteen) days. Due on 10/12 1 each 0 Past Month at Unknown time  . traZODone (DESYREL) 100 MG tablet Take 1 tablet (100 mg total) by mouth at bedtime. 30 tablet 1     Patient Stressors: Medication change or noncompliance Substance abuse  Patient Strengths: Average or above average intelligence Communication skills  Treatment Modalities: Medication Management, Group therapy, Case management,  1 to 1 session with clinician, Psychoeducation, Recreational therapy.   Physician Treatment Plan for Primary Diagnosis: Schizophrenia, undifferentiated (HCC) Long Term Goal(s): Improvement in symptoms so as ready for discharge Improvement in symptoms so as ready for discharge   Short Term Goals: Ability to identify changes in lifestyle to reduce recurrence of condition will improve Ability to verbalize feelings will improve Ability to demonstrate self-control will improve Ability to identify and develop effective coping behaviors will improve Compliance with prescribed medications will improve Ability to identify triggers associated with substance abuse/mental health issues will improve Ability to identify changes in lifestyle to reduce recurrence of condition will improve Ability to identify and develop effective coping behaviors will improve Compliance with prescribed medications will improve  Medication Management: Evaluate patient's response, side effects, and tolerance of medication regimen.  Therapeutic Interventions: 1 to 1 sessions, Unit Group sessions and Medication  administration.  Evaluation of Outcomes: Adequate for discharge   Physician Treatment Plan for Secondary Diagnosis: Principal Problem:   Schizophrenia, undifferentiated (HCC) Active Problems:   Noncompliance   Tobacco use disorder  Long Term Goal(s): Improvement in symptoms so as  ready for discharge Improvement in symptoms so as ready for discharge   Short Term Goals: Ability to identify changes in lifestyle to reduce recurrence of condition will improve Ability to verbalize feelings will improve Ability to demonstrate self-control will improve Ability to identify and develop effective coping behaviors will improve Compliance with prescribed medications will improve Ability to identify triggers associated with substance abuse/mental health issues will improve Ability to identify changes in lifestyle to reduce recurrence of condition will improve Ability to identify and develop effective coping behaviors will improve Compliance with prescribed medications will improve     Medication Management: Evaluate patient's response, side effects, and tolerance of medication regimen.  Therapeutic Interventions: 1 to 1 sessions, Unit Group sessions and Medication administration.  Evaluation of Outcomes: Adequate for discharge   RN Treatment Plan for Primary Diagnosis: Schizophrenia, undifferentiated (HCC) Long Term Goal(s): Knowledge of disease and therapeutic regimen to maintain health will improve  Short Term Goals: Ability to verbalize frustration and anger appropriately will improve, Ability to demonstrate self-control, Ability to verbalize feelings will improve and Ability to identify and develop effective coping behaviors will improve  Medication Management: RN will administer medications as ordered by provider, will assess and evaluate patient's response and provide education to patient for prescribed medication. RN will report any adverse and/or side effects to prescribing provider.  Therapeutic Interventions: 1 on 1 counseling sessions, Psychoeducation, Medication administration, Evaluate responses to treatment, Monitor vital signs and CBGs as ordered, Perform/monitor CIWA, COWS, AIMS and Fall Risk screenings as ordered, Perform wound care treatments as  ordered.  Evaluation of Outcomes: Adequate for discharge   LCSW Treatment Plan for Primary Diagnosis: Schizophrenia, undifferentiated (HCC) Long Term Goal(s): Safe transition to appropriate next level of care at discharge, Engage patient in therapeutic group addressing interpersonal concerns.  Short Term Goals: Engage patient in aftercare planning with referrals and resources, Increase ability to appropriately verbalize feelings, Facilitate acceptance of mental health diagnosis and concerns, Identify triggers associated with mental health/substance abuse issues and Increase skills for wellness and recovery  Therapeutic Interventions: Assess for all discharge needs, 1 to 1 time with Social worker, Explore available resources and support systems, Assess for adequacy in community support network, Educate family and significant other(s) on suicide prevention, Complete Psychosocial Assessment, Interpersonal group therapy.  Evaluation of Outcomes: Adequate for discharge    Progress in Treatment: Attending groups: Yes. Participating in groups: Yes. Taking medication as prescribed: Yes. Toleration medication: Yes. Family/Significant other contact made: Yes, individual(s) contacted:   Quillian Quince  at [facility] Patient understands diagnosis: Yes. Discussing patient identified problems/goals with staff: Yes. Medical problems stabilized or resolved: Yes. Denies suicidal/homicidal ideation: Yes. Issues/concerns per patient self-inventory: Yes. Other:    New problem(s) identified: No, Describe:     New Short Term/Long Term Goal(s):  Discharge Plan or Barriers: Discharge back to [facility] and follow up with [facility]  Reason for Continuation of Hospitalization: Delusions  Hallucinations Medication stabilization  Estimated Length of Stay: 3-5 days  Attendees: Patient:[name] 11/07/2015 11:17 AM  Physician: Radene Journey, MD 11/07/2015 11:17 AM  Nursing:  Shelia Media, RN 11/07/2015 11:17 AM  RN Care Manager: 11/07/2015 11:17 AM  Social Worker: Hampton Abbot, MSW, LCSW 11/07/2015 11:17 AM  Scribe for Treatment Team: Lynden OxfordKadijah R Taras Rask, Theresia MajorsLCSWA 11/07/2015 11:17 AM

## 2015-11-07 NOTE — NC FL2 (Signed)
  [place] MEDICAID FL2 LEVEL OF CARE SCREENING TOOL     IDENTIFICATION  Patient Name: [name] Birthdate: 04/07/1960 Sex: male Admission Date (Current Location): 10/29/2015  [place]  [place] [id number] [facility] Facility and Address:  [facility], [address], [place], [place] [address]      Provider Number: [id number]  Attending Physician Name and Address:  Barnabas HarriesAndrea Hernandez-Gonzale*  Relative Name and Phone Number:       Current Level of Care: Hospital Recommended Level of Care: Other (Comment) Prior Approval Number:    Date Approved/Denied:   PASRR Number:    Discharge Plan: Other (Comment)    Current Diagnoses: Patient Active Problem List   Diagnosis Date Noted  . Schizophrenia, undifferentiated (HCC) 10/29/2015  . Tobacco use disorder 10/04/2015  . Noncompliance 10/03/2015    Orientation RESPIRATION BLADDER Height & Weight     Self, Time, Situation, Place  Normal Continent Weight: 134 lb (60.8 kg) Height:  5\' 7"  (170.2 cm)  BEHAVIORAL SYMPTOMS/MOOD NEUROLOGICAL BOWEL NUTRITION STATUS      Continent    AMBULATORY STATUS COMMUNICATION OF NEEDS Skin   Independent Verbally Normal                       Personal Care Assistance Level of Assistance  Bathing, Feeding, Dressing Bathing Assistance: Independent Feeding assistance: Independent Dressing Assistance: Independent     Functional Limitations Info  Sight, Hearing, Speech Sight Info: Adequate Hearing Info: Adequate Speech Info: Adequate    SPECIAL CARE FACTORS FREQUENCY                       Contractures Contractures Info: Not present    Additional Factors Info                  Current Medications (11/07/2015):  This is the current hospital active medication list Current Facility-Administered Medications  Medication Dose Route Frequency Provider Last Rate Last Dose  . acetaminophen (TYLENOL) tablet 650 mg  650 mg Oral Q6H PRN  Audery AmelJohn T Clapacs, MD      . alum & mag hydroxide-simeth (MAALOX/MYLANTA) 200-200-20 MG/5ML suspension 30 mL  30 mL Oral Q4H PRN Audery AmelJohn T Clapacs, MD      . amantadine (SYMMETREL) capsule 100 mg  100 mg Oral BID Audery AmelJohn T Clapacs, MD   100 mg at 11/07/15 0815  . LORazepam (ATIVAN) tablet 2 mg  2 mg Oral QHS Jimmy FootmanAndrea Hernandez-Gonzalez, MD   2 mg at 11/06/15 1741  . LORazepam (ATIVAN) tablet 2 mg  2 mg Oral TID PRN Jimmy FootmanAndrea Hernandez-Gonzalez, MD   2 mg at 11/06/15 2142  . magnesium hydroxide (MILK OF MAGNESIA) suspension 30 mL  30 mL Oral Daily PRN Audery AmelJohn T Clapacs, MD      . nicotine (NICODERM CQ - dosed in mg/24 hours) patch 21 mg  21 mg Transdermal Daily Jimmy FootmanAndrea Hernandez-Gonzalez, MD   21 mg at 11/03/15 [id number]  . risperiDONE (RISPERDAL) tablet 4 mg  4 mg Oral BID Jimmy FootmanAndrea Hernandez-Gonzalez, MD   4 mg at 11/07/15 0815  . risperiDONE microspheres (RISPERDAL CONSTA) injection 50 mg  50 mg Intramuscular Q14 Days Jimmy FootmanAndrea Hernandez-Gonzalez, MD   50 mg at 10/30/15 1752     Discharge Medications: Please see discharge summary for a list of discharge medications.  Relevant Imaging Results:  Relevant Lab Results:   Additional Information    Lynden OxfordKadijah R Farhan Jean, LCSWA

## 2015-11-28 ENCOUNTER — Encounter: Payer: Self-pay | Admitting: Emergency Medicine

## 2015-11-28 ENCOUNTER — Emergency Department
Admission: EM | Admit: 2015-11-28 | Discharge: 2015-12-02 | Disposition: A | Discharge status: Other Acute Inpatient Hospital | Payer: Medicare Other | Attending: Emergency Medicine | Admitting: Emergency Medicine

## 2015-11-28 DIAGNOSIS — Z79899 Other long term (current) drug therapy: Secondary | ICD-10-CM | POA: Insufficient documentation

## 2015-11-28 DIAGNOSIS — Z046 Encounter for general psychiatric examination, requested by authority: Secondary | ICD-10-CM | POA: Diagnosis present

## 2015-11-28 DIAGNOSIS — F17210 Nicotine dependence, cigarettes, uncomplicated: Secondary | ICD-10-CM | POA: Diagnosis not present

## 2015-11-28 DIAGNOSIS — F29 Unspecified psychosis not due to a substance or known physiological condition: Secondary | ICD-10-CM | POA: Diagnosis not present

## 2015-11-28 LAB — CBC WITH DIFFERENTIAL/PLATELET
BASOS PCT: 1 %
Basophils Absolute: 0 10*3/uL (ref 0–0.1)
EOS ABS: 0.2 10*3/uL (ref 0–0.7)
Eosinophils Relative: 3 %
HEMATOCRIT: 40.5 % (ref 40.0–52.0)
Hemoglobin: 13.5 g/dL (ref 13.0–18.0)
Lymphocytes Relative: 31 %
Lymphs Abs: 1.9 10*3/uL (ref 1.0–3.6)
MCH: 32.5 pg (ref 26.0–34.0)
MCHC: 33.3 g/dL (ref 32.0–36.0)
MCV: 97.6 fL (ref 80.0–100.0)
MONO ABS: 0.8 10*3/uL (ref 0.2–1.0)
MONOS PCT: 13 %
NEUTROS ABS: 3.2 10*3/uL (ref 1.4–6.5)
Neutrophils Relative %: 52 %
Platelets: 202 10*3/uL (ref 150–440)
RBC: 4.14 MIL/uL — ABNORMAL LOW (ref 4.40–5.90)
RDW: 14.8 % — AB (ref 11.5–14.5)
WBC: 6.1 10*3/uL (ref 3.8–10.6)

## 2015-11-28 LAB — COMPREHENSIVE METABOLIC PANEL
ALBUMIN: 4 g/dL (ref 3.5–5.0)
ALK PHOS: 68 U/L (ref 38–126)
ALT: 10 U/L — AB (ref 17–63)
AST: 18 U/L (ref 15–41)
Anion gap: 7 (ref 5–15)
BILIRUBIN TOTAL: 0.2 mg/dL — AB (ref 0.3–1.2)
BUN: 11 mg/dL (ref 6–20)
CALCIUM: 9 mg/dL (ref 8.9–10.3)
CO2: 25 mmol/L (ref 22–32)
Chloride: 105 mmol/L (ref 101–111)
Creatinine, Ser: 0.76 mg/dL (ref 0.61–1.24)
GFR calc Af Amer: >60 mL/min (ref >60)
GFR calc non Af Amer: >60 mL/min (ref >60)
GLUCOSE: 86 mg/dL (ref 65–99)
Potassium: 3.3 mmol/L — ABNORMAL LOW (ref 3.5–5.1)
Sodium: 137 mmol/L (ref 135–145)
TOTAL PROTEIN: 7.5 g/dL (ref 6.5–8.1)

## 2015-11-28 LAB — ACETAMINOPHEN LEVEL: Acetaminophen (Tylenol), Serum: <10 ug/mL — ABNORMAL LOW (ref 10–30)

## 2015-11-28 LAB — SALICYLATE LEVEL: Salicylate Lvl: <7 mg/dL (ref 2.8–30.0)

## 2015-11-28 LAB — ETHANOL: Alcohol, Ethyl (B): <5 mg/dL (ref <5)

## 2015-11-28 MED ORDER — ZIPRASIDONE MESYLATE 20 MG IM SOLR
20.0000 mg | Freq: Once | INTRAMUSCULAR | Status: AC
  Administered 2015-11-28: 20 mg via INTRAMUSCULAR

## 2015-11-28 MED ORDER — LORAZEPAM 2 MG/ML IJ SOLN
2.0000 mg | Freq: Once | INTRAMUSCULAR | Status: AC
  Administered 2015-11-28: 2 mg via INTRAMUSCULAR

## 2015-11-28 MED ORDER — DIPHENHYDRAMINE HCL 50 MG/ML IJ SOLN
INTRAMUSCULAR | Status: AC
  Administered 2015-11-28: 50 mg via INTRAMUSCULAR
  Filled 2015-11-28: qty 1

## 2015-11-28 MED ORDER — DIPHENHYDRAMINE HCL 50 MG/ML IJ SOLN
50.0000 mg | Freq: Once | INTRAMUSCULAR | Status: AC
  Administered 2015-11-28: 50 mg via INTRAMUSCULAR

## 2015-11-28 MED ORDER — LORAZEPAM 2 MG/ML IJ SOLN
INTRAMUSCULAR | Status: AC
  Administered 2015-11-28: 2 mg via INTRAMUSCULAR
  Filled 2015-11-28: qty 1
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
## 2015-11-28 NOTE — BHH Counselor (Addendum)
Clinician seen EDP note completed in EPIC on pt. Clinician contacted Revonda Standardllison to completed TTS consult and noted due to pt being very aggressive he was sedated. Clinician asked RN to removed TTS consult and expressed day shift to completed TTS consult due to pt's sedation.  Gwinda Passereylese D Bennett, MS, [facility], [facility] Triage Specialist [phone]

## 2015-11-28 NOTE — BHH Counselor (Addendum)
Clinician spoke to Revonda StandardAllison, Charity fundraiserN at [facility],and expressed before a TTS consult can be completed, a note from the [facility] must [facility]. Clinicain noted from the RN that the pt just rolled into the ED and has not been triaged. Clinician provided Revonda StandardAllison, RN with her contact information [phone] and noted once pt is seen she will contact staff to begin the TTS consult.   Gwinda Passereylese D Bennett, MS, [facility], [facility] Triage Specialist [phone]

## 2015-11-28 NOTE — ED Triage Notes (Signed)
Per [facility]: Pt. Presenting today for aggression, "smoking marijuana", and not happy at group home.
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
## 2015-11-29 DIAGNOSIS — F29 Unspecified psychosis not due to a substance or known physiological condition: Secondary | ICD-10-CM | POA: Diagnosis not present

## 2015-11-29 LAB — URINALYSIS COMPLETE WITH MICROSCOPIC (ARMC ONLY)
BACTERIA UA: NONE SEEN
Bilirubin Urine: NEGATIVE
GLUCOSE, UA: NEGATIVE mg/dL
Hgb urine dipstick: NEGATIVE
Nitrite: NEGATIVE
PROTEIN: NEGATIVE mg/dL
Specific Gravity, Urine: 1.018 (ref 1.005–1.030)
pH: 5 (ref 5.0–8.0)

## 2015-11-29 LAB — URINE DRUG SCREEN, QUALITATIVE (ARMC ONLY)
AMPHETAMINES, UR SCREEN: NOT DETECTED
Barbiturates, Ur Screen: NOT DETECTED
Benzodiazepine, Ur Scrn: POSITIVE — AB
Cannabinoid 50 Ng, Ur ~~LOC~~: NOT DETECTED
Cocaine Metabolite,Ur ~~LOC~~: NOT DETECTED
MDMA (ECSTASY) UR SCREEN: NOT DETECTED
Methadone Scn, Ur: NOT DETECTED
Opiate, Ur Screen: NOT DETECTED
Phencyclidine (PCP) Ur S: NOT DETECTED
TRICYCLIC, UR SCREEN: NOT DETECTED

## 2015-11-29 MED ORDER — LORAZEPAM 2 MG PO TABS
2.0000 mg | ORAL_TABLET | ORAL | Status: DC | PRN
  Administered 2015-11-30 – 2015-12-02 (×3): 2 mg via ORAL
  Filled 2015-11-29 (×3): qty 1

## 2015-11-29 MED ORDER — ZIPRASIDONE MESYLATE 20 MG IM SOLR
20.0000 mg | Freq: Once | INTRAMUSCULAR | Status: AC
  Administered 2015-11-29: 20 mg via INTRAMUSCULAR
  Filled 2015-11-29: qty 20

## 2015-11-29 MED ORDER — AMANTADINE HCL 100 MG PO CAPS
100.0000 mg | ORAL_CAPSULE | Freq: Two times a day (BID) | ORAL | Status: DC
  Administered 2015-11-29 – 2015-12-02 (×6): 100 mg via ORAL
  Filled 2015-11-29 (×9): qty 1

## 2015-11-29 MED ORDER — TRAZODONE HCL 100 MG PO TABS
100.0000 mg | ORAL_TABLET | Freq: Every day | ORAL | Status: DC
  Administered 2015-11-29 – 2015-12-01 (×3): 100 mg via ORAL
  Filled 2015-11-29 (×3): qty 1

## 2015-11-29 MED ORDER — RISPERIDONE 3 MG PO TABS
4.0000 mg | ORAL_TABLET | Freq: Two times a day (BID) | ORAL | Status: DC
  Administered 2015-11-29 – 2015-12-02 (×6): 4 mg via ORAL
  Filled 2015-11-29 (×6): qty 1

## 2015-11-29 MED ORDER — LORAZEPAM 1 MG PO TABS
ORAL_TABLET | ORAL | Status: AC
  Administered 2015-11-29: 22:00:00
  Filled 2015-11-29: qty 1

## 2015-11-29 MED ORDER — LORAZEPAM 1 MG PO TABS
1.0000 mg | ORAL_TABLET | Freq: Once | ORAL | Status: AC
  Administered 2015-11-29: 1 mg via ORAL

## 2015-11-29 MED ORDER — LORAZEPAM 2 MG/ML IJ SOLN: 2.0000 mg | Freq: Once | INTRAMUSCULAR | Status: DC

## 2015-11-29 NOTE — ED Notes (Signed)
Patient took medications po. Patient asking when he is leaving. This Clinical research associatewriter told patient "transfer was being worked on and once I know something I will let you know." Patient was satisfied with answer and laid down in bed.

## 2015-11-29 NOTE — ED Notes (Signed)
[phone], waited until patient woke up

## 2015-11-29 NOTE — BH Assessment (Signed)
Referral information for Psychiatric Hospitalization Placement have been faxed to;    [facility] [phone])   [facility] [phone]),    [facility] [phone]),    [address] [phone]),    [facility] [phone]),    [facility] [phone]),    [facility] [phone]),    [facility]   [facility]

## 2015-11-29 NOTE — ED Notes (Signed)
Pt. Verbally aggressive, yelling out, delusional. Pt. Refusing to comply with ED staff requests and remains aggressive. MD ordered medication for sedation.

## 2015-11-29 NOTE — ED Notes (Signed)
Despite medication intervention, pt remains aggressive towards this RN. Naming threats and using explicit terms and degrading language towards this RN. Sedation medication orders by MD in chart for intervention.
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
## 2015-11-29 NOTE — ED Notes (Signed)
Pt has been cooperative with RN and ED staff on this shift. Initially he was agitated and anxious to talk to the tele psych doctor. After talking to the tele psych doctor the pt was calm and cooperative. Pt understands that placement is a process and that he will stay here until placement is accepted.

## 2015-11-29 NOTE — BH Assessment (Signed)
[facility].    State Referral Form and supporting documentation completed and faxed to [employer].   Received the Authorization/Tracking # [id number] from [employer] (Chris-[id number]).   Verbal screening completed with [facility]([phone]), information faxed and confirmed it was received.

## 2015-11-29 NOTE — ED Notes (Signed)
Pt. To BHU from ED ambulatory without difficulty, to room  . Report from RN. Pt. Is alert, warm and dry in no distress. Pt. Denies SI, HI, and AVH. Pt was calm with move but once in the unit began [facility]. Pt states he wants his mother to be removed off his birth certificate and daughter placed on there.  Pt. Made aware of security cameras and Q15 minute rounds. Pt. Encouraged to let Nursing staff know of any concerns or needs.

## 2015-11-29 NOTE — BH Assessment (Signed)
Writer [facility] ([phone]), patient is on their wait list.

## 2015-11-29 NOTE — ED Notes (Signed)
Due to pt persistent aggressive behavior, pt in NAD with equal unlabored respirations during sleep, vitals signs not obtained at this time per RN safety.

## 2015-11-30 DIAGNOSIS — F29 Unspecified psychosis not due to a substance or known physiological condition: Secondary | ICD-10-CM | POA: Diagnosis not present

## 2015-11-30 MED ORDER — DOCUSATE SODIUM 100 MG PO CAPS: 100.0000 mg | ORAL_CAPSULE | Freq: Once | ORAL | Status: DC

## 2015-11-30 MED ORDER — ZIPRASIDONE HCL 20 MG PO CAPS
ORAL_CAPSULE | ORAL | Status: AC
  Administered 2015-11-30: 20 mg via ORAL
  Filled 2015-11-30: qty 1

## 2015-11-30 MED ORDER — ZIPRASIDONE MESYLATE 20 MG IM SOLR: 20.0000 mg | Freq: Once | INTRAMUSCULAR | Status: AC

## 2015-11-30 MED ORDER — ZIPRASIDONE HCL 20 MG PO CAPS
20.0000 mg | ORAL_CAPSULE | Freq: Once | ORAL | Status: AC
  Administered 2015-11-30: 20 mg via ORAL

## 2015-11-30 NOTE — ED Notes (Signed)
Patient came out of room ranting about his mother. And talking about wanting to be discharged, and that he does not want any breakfast lunch or dinner. That he is going to refused to eat. This Clinical research associatewriter gave patient PRN ativan PO.

## 2015-11-30 NOTE — ED Provider Notes (Signed)
-----------------------------------------   6:33 AM on 11/30/2015 -----------------------------------------   Blood pressure (!) 166/69, pulse 69, temperature 97.7 F (36.5 C), temperature source Oral, resp. rate 18, height 5\' 8"  (1.727 m), weight 140 lb (63.5 kg), SpO2 97 %.  [facility] that patient appears to be responding to internal stimuli and escalating his behavior. Will administer Geodon, orally if he will take it; otherwise will administer intramuscular Geodon.   Irean HongJade J Verity Gilcrest, MD 11/30/15 [phone]

## 2015-11-30 NOTE — ED Notes (Signed)
Pt has remained calm and cooperative, watching TV in his room. No concerns voiced. Maintained on 15 minute checks and observation by security camera for safety.

## 2015-11-30 NOTE — ED Notes (Signed)
Pt asking tech when he will be seen by a doctor. RN told tech to explain to the pt he has already been seen by a doctor and is waiting for a bed to become available.  Pt accepting. Maintained on 15 minute checks and observation by security camera for safety.

## 2015-11-30 NOTE — ED Notes (Signed)
Pt told RN he was a test tube baby from [employer] while also stating Beverly SessionsJohn Lennon was his father. Pt spoke of being a part of the Royal family. The pt's speech was difficult for this writer to understand. Pt denied SI/HI. Pt has been pleasant and cooperative. Compliant with medications. Maintained on 15 minute checks and observation by security camera for safety.

## 2015-11-30 NOTE — Progress Notes (Signed)
LCSW spoke to Group home provider who was able to speak to patient. He apologized and promised her he would be good and behave and "leave Joe alone". He understands he will remain for a few days until medications gets into his system.  LCSW spoke to provider and explained [facility] will increase patients in home support on Monday. Ms Sharyne PeachMarlett [facility] expressed this was a good plan.  Delta Air LinesClaudine Malcomb Gangemi LCSW [phone]

## 2015-11-30 NOTE — ED Notes (Signed)
Pt continues to ask when he will be moved to BMU. Social worker explained to patient he would remain in BHU for the night [facility] Pt accepting. Pt resting in his room. No concerns voiced. Maintained on 15 minute checks and observation by security camera for safety.

## 2015-11-30 NOTE — ED Notes (Signed)
ED BHU PLACEMENT JUSTIFICATION Is the patient under IVC or is there intent for IVC: Yes.   Is the patient medically cleared: Yes.   Is there vacancy in the ED BHU: Yes.   Is the population mix appropriate for patient: Yes.   Is the patient awaiting placement in inpatient or outpatient setting: Yes.   Has the patient had a psychiatric consult: Yes.   Survey of unit performed for contraband, proper placement and condition of furniture, tampering with fixtures in bathroom, shower, and each patient room: Yes.   APPEARANCE/BEHAVIOR calm, cooperative and adequate rapport can be established NEURO ASSESSMENT Orientation: place and person Hallucinations: Yes.  Auditory Hallucinations Speech:  Gait: normal RESPIRATORY ASSESSMENT Normal expansion.  Clear to auscultation.  No rales, rhonchi, or wheezing. CARDIOVASCULAR ASSESSMENT regular rate and rhythm, S1, S2 normal, no murmur, click, rub or gallop GASTROINTESTINAL ASSESSMENT soft, nontender, BS WNL, no r/g EXTREMITIES normal strength, tone, and muscle mass PLAN OF CARE Provide calm/safe environment. Vital signs assessed twice daily. ED BHU Assessment once each 12-hour shift. Collaborate with intake RN daily or as condition indicates. Assure the ED provider has rounded once each shift. Provide and encourage hygiene. Provide redirection as needed. Assess for escalating behavior; address immediately and inform ED provider.  Assess family dynamic and appropriateness for visitation as needed: Yes.   Educate the patient/family about BHU procedures/visitation: Yes.

## 2015-11-30 NOTE — ED Notes (Signed)
Patient resting quietly in room. No noted distress or abnormal behaviors noted. Will continue 15 minute checks and observation by security camera for safety. 

## 2015-11-30 NOTE — ED Notes (Signed)
Verbal orders for geodon 20 mg po. Patient took medication.  Orange juice and bag of chips, and banana was give with medication. Will continue to monitor.

## 2015-11-30 NOTE — ED Notes (Signed)
Pt will be allowed to return to his group home on Sunday per LCSW.  Pt will no longer transfer to BMU. Pt will remain in the BHU. Pt accepting. Maintained on 15 minute checks and observation by security camera for safety.

## 2015-11-30 NOTE — ED Notes (Signed)
Report was received from Amy B., RN; Pt. Verbalizes no complaints or distress; denies S.I./Hi.; has been compliant with treatment; looking forward to possible discharge on Sunday. Continue to monitor with 15 min. Monitoring.

## 2015-11-30 NOTE — ED Notes (Signed)
Pt came to nurses station asking for his breakfast tray. Tray given. Pt returned to his room. No concerns voiced. Maintained on 15 minute checks and observation by security camera for safety.

## 2015-11-30 NOTE — ED Notes (Signed)
Took PM meds without difficulty.

## 2015-11-30 NOTE — Progress Notes (Signed)
LCSW met with patient who asked about his group home. He requested he call Ms Elnoria Howard this afternoon. He reports he is feeling better. He explained he only get some cigarettes each day and after he smokes them all he has none left and he gets angry.Patient was very delusional earlier this morning and responding to internal stimuli according to [place].  LCSW encouraged patient to think what he could do differently, he replied he could try to space out his cigarettes and this might help him from getting mad. It was explained their are no beds available  Until Monday and he reports that he will speak to Ms Marlett to see if she will take him back soon.  LCSW called group home and spoke to Ms Marlette. She reports he is mostly pleasant but every week he seems to have an anger outburst and its usually over smoking. She understands he may return once patient has stabilized and reported he has not been given a 30 day notice. She is agreeable to take him back and will allow a call today at 2pm today. She will assess his basline.   LCSW has agreed to call [name] at [facility] to see if patients support could be increased in the near future to assist with diverting him from frequent ED visits. Called [address] [name] [phone] 2-3 times a week a couple of ACT team members. This group will attempt to support him as medication changed and increase his community visits to up to 4-5x week. His one to one worker stated since august patient has not been at his baseline.  Spoke to patient and he is agreeable to talk to his care provider at 2pm.  LCSW will facilitate call.   Dustin Gebbia LCSW

## 2015-11-30 NOTE — ED Notes (Signed)
Patient appears to be responding to internal stimuli. Patient is talking and shouting and pointing finger in room. Will continue to monitor.

## 2015-11-30 NOTE — ED Notes (Signed)
Pt refused breakfast tray, but did accept a ginger ale. Pt laying in bed resting. Maintained on 15 minute checks and observation by security camera for safety.

## 2015-12-01 DIAGNOSIS — F29 Unspecified psychosis not due to a substance or known physiological condition: Secondary | ICD-10-CM | POA: Diagnosis not present

## 2015-12-01 NOTE — ED Notes (Signed)

## 2015-12-01 NOTE — ED Notes (Signed)
Patient resting quietly in room. No noted distress or abnormal behaviors noted. Will continue 15 minute checks and observation by security camera for safety. 

## 2015-12-01 NOTE — ED Notes (Signed)
Patient received meal tray and beverage. Maintained on 15 minute checks and observation by security camera for safety. 

## 2015-12-01 NOTE — ED Notes (Signed)
PT IVC/ PENDING PLACEMENT  

## 2015-12-01 NOTE — ED Notes (Signed)
Patient with multiple questions regarding potential discharge tomorrow.  He was told that SW was making all arrangements and he would be updated with any new information. Patient took all medications without incident. Maintained on 15 minute checks and observation by security camera for safety.

## 2015-12-01 NOTE — ED Notes (Signed)
ENVIRONMENTAL ASSESSMENT Potentially harmful objects out of patient reach: Yes Personal belongings secured: Yes Patient dressed in hospital provided attire only: Yes Plastic bags out of patient reach: Yes Patient care equipment (cords, cables, call bells, lines, and drains) shortened, removed, or accounted for: Yes Equipment and supplies removed from bottom of stretcher: Yes Potentially toxic materials out of patient reach: Yes Sharps container removed or out of patient reach: Yes  Patient awake, calm and cooperative. Requested and received a beverage.

## 2015-12-01 NOTE — ED Provider Notes (Signed)
-----------------------------------------   7:13 AM on 12/01/2015 -----------------------------------------   Blood pressure 135/62, pulse (!) 59, temperature 98 F (36.7 C), temperature source Tympanic, resp. rate 16, height 5\' 8"  (1.727 m), weight 140 lb (63.5 kg), SpO2 95 %.  The patient had no acute events since last update.  Calm and cooperative at this time.  Disposition is pending Psychiatry/Behavioral Medicine team recommendations.     Irean HongJade J Syanne Looney, MD 12/01/15 [phone]

## 2015-12-01 NOTE — ED Notes (Signed)
Report was received from Amy H., RN; Pt. Verbalizes no complaints or distress; denies S.I./Hi. Continue to monitor with 15 min. Monitoring. 

## 2015-12-01 NOTE — Progress Notes (Signed)
LCSW consulted other BHU nurse and reported patient will be returning to his group home on Sunday 2pm.  Camryn Quesinberry MishawakaBandi LCSW [phone]

## 2015-12-02 DIAGNOSIS — F29 Unspecified psychosis not due to a substance or known physiological condition: Secondary | ICD-10-CM | POA: Diagnosis not present
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
## 2015-12-02 NOTE — ED Notes (Signed)
Pt discharged ambulatory accompanied by sheriff. Denies SI/HI. All personal belongings sent with patient.  .Marland Kitchen

## 2015-12-02 NOTE — BH Specialist Note (Signed)
This Clinical research associatewriter received a call from [place] [facility] requesting updated vitals, MAR, and custody order.  It was reported a bed is possibly available at this time for admission.     This Clinical research associatewriter reviewed previous notes and it was suggested the patient will be discharged to group home today.  This Clinical research associatewriter spoke with Dr. Shaune PollackLord who suggested another [facility] be completed to clarify the patient's needs at this time.    This Clinical research associatewriter spoke with Roddie McMarlene, ACTT staff member who agrees with the patient being [facility].  She stated that the patient had not presented at baseline since his readmission to this ACTT services in August.  She reported that Liborio NixonJanice ([phone]) is the team clinical lead and for the [facility] to call her if information is needed.  This Clinical research associatewriter spoke with [facility] group home staff to collect collateral information.  She is agreeing with the [facility].  She was informed that the [facility] psychiatrist may contact her for collateral information.     ED Secretary Ronnie called the [facility] and was given the contact information for TTS staff and ACTT staff to be contact by the [facility] psychiatrist.       Maryelizabeth Rowanressa Soham Hollett, MSW, Tinnie GensLCSW, LCAS, CCSI [facility] Triage Specialist [phone]

## 2015-12-02 NOTE — ED Notes (Signed)
[facility] Pt became increasingly agitated after being informed that he would go to another hospital. Pt repeatedly stated, "I was told I was going home today at 2 ".

## 2015-12-02 NOTE — ED Provider Notes (Signed)
-----------------------------------------   8:00 AM on 12/02/2015 -----------------------------------------   Blood pressure (!) 148/69, pulse (!) 52, temperature 97.8 F (36.6 C), temperature source Oral, resp. rate 16, height 5\' 8"  (1.727 m), weight 140 lb (63.5 kg), SpO2 99 %.  The patient had no acute events since last update.  Calm and cooperative at this time.  Disposition is pending Psychiatry/Behavioral Medicine team recommendations.     Irean HongJade J Sung, MD 12/02/15 0800

## 2015-12-02 NOTE — ED Notes (Signed)
Patient increasingly upset when [facility] was [facility]. Started yelling at the computer and staff. Patient exhibiting delusional thinking, stating the "white woman" was staring at his penis and he did not need that to happen.  Patient was able to respond to verbal redirection and sit with staff. He took Ativan 2 mg po per MD order to assist with behavioral control. Patient able to calm down and talk about his feelings with staff. RN answered all questions and reassured patient that change in treatment plan was to assure that patient would be "100%" before returning to group home. Patient agreed to new plan. Will continue to monitor clinical status and maintain all safety checks.

## 2015-12-02 NOTE — BH Specialist Note (Addendum)
This Clinical research associatewriter faxed updated IVC paperwork to the [facility]. Per [facility] and can be transported for admission. It was reported that Dr. Elisabeth MostStevenson is the medical director.      ED RN notified of transfer: Amy, RN ED Secretary: French Anaracy EDP: Dr. Shaune PollackLord Call Report to: Junious DresserConnie [phone]      Maryelizabeth Rowanressa Jameika Kinn, MSW, Clare CharonLCSW, LCAS, CCSI [facility] Triage Specialist [phone]

## 2015-12-02 NOTE — ED Notes (Signed)
Patient calm and cooperative and currently denies SI/HI and AVH. Pt compliant with medications. Pt asking if he will be able to go home today at 2p. Pt supported emotionally and encouraged to express concerns and ask questions.    Pt remains safe with 15 minute checks. Will continue POC.

## 2015-12-02 NOTE — Progress Notes (Signed)
LCSW was informed that [facility]- He became agitated initialy and was easily calmed down by ED B[facility] staff and LCSW explained that he will having a short term stay and then will return to group home after. It was explained he would benefit  from  further assessment. LCSW was polite and pt was supported and RN BHU- Remained to explain the discharge process [facility]. Patients question were answered.  LCSW was given verbal consent to speak to his grandmother by patient. She reported her name is [facility] and she is 53 years old. We [facility] and then return to his group home. She was pleased with plan. She asked that nurses gage her grandsdons mood before he calls her.  Delta Air LinesClaudine Talullah Abate LCSW [phone]

## 2015-12-02 NOTE — BH Specialist Note (Signed)
This Clinical research associatewriter faxed information requested by [facility]nie [facility]. She called back to informed this writer the IVC paperwork was incorrect and need to be corrected.  This writer informed French Anaracy, ED Secretary of the needed corrections and she informed the EDP.       Maryelizabeth Rowanressa Seve Monette, MSW, Tinnie GensLCSW, LCAS, CCSI [facility] Triage Specialist [phone]

## 2015-12-27 ENCOUNTER — Emergency Department
Admission: EM | Admit: 2015-12-27 | Discharge: 2015-12-28 | Disposition: A | Discharge status: Home / Self Care | Payer: Medicare Other | Attending: Emergency Medicine | Admitting: Emergency Medicine

## 2015-12-27 DIAGNOSIS — F209 Schizophrenia, unspecified: Secondary | ICD-10-CM | POA: Insufficient documentation

## 2015-12-27 DIAGNOSIS — Z79899 Other long term (current) drug therapy: Secondary | ICD-10-CM | POA: Insufficient documentation

## 2015-12-27 DIAGNOSIS — F17210 Nicotine dependence, cigarettes, uncomplicated: Secondary | ICD-10-CM | POA: Insufficient documentation

## 2015-12-27 DIAGNOSIS — E876 Hypokalemia: Secondary | ICD-10-CM | POA: Insufficient documentation

## 2015-12-27 DIAGNOSIS — Z9119 Patient's noncompliance with other medical treatment and regimen: Secondary | ICD-10-CM

## 2015-12-27 DIAGNOSIS — R45851 Suicidal ideations: Secondary | ICD-10-CM | POA: Diagnosis present

## 2015-12-27 DIAGNOSIS — F203 Undifferentiated schizophrenia: Secondary | ICD-10-CM | POA: Diagnosis not present

## 2015-12-27 DIAGNOSIS — Z91199 Patient's noncompliance with other medical treatment and regimen due to unspecified reason: Secondary | ICD-10-CM

## 2015-12-27 LAB — CBC WITH DIFFERENTIAL/PLATELET
BASOS ABS: 0 10*3/uL (ref 0–0.1)
BASOS PCT: 1 %
EOS PCT: 2 %
Eosinophils Absolute: 0.1 10*3/uL (ref 0–0.7)
HEMATOCRIT: 38 % — AB (ref 40.0–52.0)
Hemoglobin: 12.7 g/dL — ABNORMAL LOW (ref 13.0–18.0)
Lymphocytes Relative: 25 %
Lymphs Abs: 1.4 10*3/uL (ref 1.0–3.6)
MCH: 31.9 pg (ref 26.0–34.0)
MCHC: 33.5 g/dL (ref 32.0–36.0)
MCV: 95.2 fL (ref 80.0–100.0)
MONO ABS: 0.6 10*3/uL (ref 0.2–1.0)
MONOS PCT: 11 %
NEUTROS ABS: 3.5 10*3/uL (ref 1.4–6.5)
Neutrophils Relative %: 61 %
PLATELETS: 216 10*3/uL (ref 150–440)
RBC: 3.99 MIL/uL — ABNORMAL LOW (ref 4.40–5.90)
RDW: 13.7 % (ref 11.5–14.5)
WBC: 5.7 10*3/uL (ref 3.8–10.6)

## 2015-12-27 LAB — COMPREHENSIVE METABOLIC PANEL
ALBUMIN: 4.4 g/dL (ref 3.5–5.0)
ALT: 12 U/L — ABNORMAL LOW (ref 17–63)
ANION GAP: 9 (ref 5–15)
AST: 21 U/L (ref 15–41)
Alkaline Phosphatase: 68 U/L (ref 38–126)
BILIRUBIN TOTAL: 0.7 mg/dL (ref 0.3–1.2)
BUN: 9 mg/dL (ref 6–20)
CHLORIDE: 107 mmol/L (ref 101–111)
CO2: 24 mmol/L (ref 22–32)
Calcium: 9.3 mg/dL (ref 8.9–10.3)
Creatinine, Ser: 0.75 mg/dL (ref 0.61–1.24)
GFR calc Af Amer: >60 mL/min (ref >60)
Glucose, Bld: 105 mg/dL — ABNORMAL HIGH (ref 65–99)
POTASSIUM: 3.1 mmol/L — AB (ref 3.5–5.1)
Sodium: 140 mmol/L (ref 135–145)
TOTAL PROTEIN: 8.1 g/dL (ref 6.5–8.1)

## 2015-12-27 LAB — ETHANOL: Alcohol, Ethyl (B): <5 mg/dL (ref <5)

## 2015-12-27 LAB — ACETAMINOPHEN LEVEL

## 2015-12-27 LAB — URINE DRUG SCREEN, QUALITATIVE (ARMC ONLY)
Amphetamines, Ur Screen: NOT DETECTED
BARBITURATES, UR SCREEN: NOT DETECTED
BENZODIAZEPINE, UR SCRN: NOT DETECTED
CANNABINOID 50 NG, UR ~~LOC~~: NOT DETECTED
Cocaine Metabolite,Ur ~~LOC~~: NOT DETECTED
MDMA (Ecstasy)Ur Screen: NOT DETECTED
METHADONE SCREEN, URINE: NOT DETECTED
Opiate, Ur Screen: NOT DETECTED
Phencyclidine (PCP) Ur S: NOT DETECTED
TRICYCLIC, UR SCREEN: NOT DETECTED

## 2015-12-27 LAB — SALICYLATE LEVEL

## 2015-12-27 MED ORDER — POTASSIUM CHLORIDE CRYS ER 20 MEQ PO TBCR
40.0000 meq | EXTENDED_RELEASE_TABLET | Freq: Once | ORAL | Status: DC
  Filled 2015-12-27: qty 2

## 2015-12-27 MED ORDER — ZIPRASIDONE MESYLATE 20 MG IM SOLR
10.0000 mg | Freq: Once | INTRAMUSCULAR | Status: AC
  Administered 2015-12-27: 10 mg via INTRAMUSCULAR

## 2015-12-27 MED ORDER — RISPERIDONE 3 MG PO TABS
4.0000 mg | ORAL_TABLET | Freq: Two times a day (BID) | ORAL | Status: DC
  Administered 2015-12-27 – 2015-12-28 (×2): 4 mg via ORAL
  Filled 2015-12-27 (×2): qty 1

## 2015-12-27 MED ORDER — LORAZEPAM 2 MG PO TABS
ORAL_TABLET | ORAL | Status: AC
  Administered 2015-12-27: 2 mg
  Filled 2015-12-27: qty 1

## 2015-12-27 MED ORDER — LORAZEPAM 2 MG/ML IJ SOLN
1.0000 mg | Freq: Once | INTRAMUSCULAR | Status: AC
  Administered 2015-12-27: 1 mg via INTRAMUSCULAR
  Filled 2015-12-27: qty 1

## 2015-12-27 MED ORDER — ZIPRASIDONE MESYLATE 20 MG IM SOLR
INTRAMUSCULAR | Status: AC
  Administered 2015-12-27: 10 mg via INTRAMUSCULAR
  Filled 2015-12-27: qty 20

## 2015-12-27 MED ORDER — ZIPRASIDONE MESYLATE 20 MG IM SOLR: 20.0000 mg | Freq: Once | INTRAMUSCULAR | Status: DC

## 2015-12-27 MED ORDER — ZIPRASIDONE MESYLATE 20 MG IM SOLR
INTRAMUSCULAR | Status: AC
  Administered 2015-12-27: 20:00:00
  Filled 2015-12-27: qty 20

## 2015-12-27 MED ORDER — AMANTADINE HCL 100 MG PO CAPS
100.0000 mg | ORAL_CAPSULE | Freq: Two times a day (BID) | ORAL | Status: DC
  Administered 2015-12-27 – 2015-12-28 (×2): 100 mg via ORAL
  Filled 2015-12-27 (×6): qty 1

## 2015-12-27 NOTE — ED Notes (Signed)
Spoke with Dr. York CeriseForbach [facility].  EDP [facility] as [facility] is medically cleared at this time.  Pt redirectable at this time.  Spoke with Jillyn HiddenGary, RN in [facility] to see if pt able to move over at this time.  Awaiting return call.

## 2015-12-27 NOTE — ED Notes (Signed)
Pt. Becoming increasing agitated.  Pt. Talks about a girl/woman by the name of Britney from the group home where patient is from "[facility]".

## 2015-12-27 NOTE — ED Triage Notes (Signed)
[facility].

## 2015-12-27 NOTE — Consult Note (Signed)
  Psychiatry: Patient seen chart reviewed. Patient familiar from previous encounters. This is a patient with schizoaffective disorder and chronic behavior problems. By the time I saw him this evening he had been given some medication and was very sedated slurring his speech and unable to be interviewed. I've made sure he is back on his risperidone and Symmetrel. I will reassess tomorrow.

## 2015-12-27 NOTE — ED Notes (Signed)
Pt awake and coughing.  Pt states "[name] is going to be mad because you are trying to kill me"  Pt agitated, states, "I need my medicine, I need a shot"  Md made aware and orders received.  Pt offered coffee.  Pt is cooperative at this time, although does appear agitated moderately.

## 2015-12-27 NOTE — ED Notes (Signed)
Pt. Up to room door talking loudly. Pt. Redirected to bed by Security.

## 2015-12-27 NOTE — ED Triage Notes (Signed)
Patient ambulatory to triage with steady gait, without difficulty or distress noted; pt accomp by [employer] deputy for [place]C; papers indicate pt with hx schizophrenia, SI and HI; pt refuses to speak with this nurse stating "I don't talk to white women"; pt very loud, uncooperative; charge nurse notified and awaiting exam room assignment for further evaluation

## 2015-12-27 NOTE — ED Provider Notes (Signed)
-----------------------------------------   7:51 PM on 12/27/2015 -----------------------------------------  The patient is acting up and the BHU and becoming agitated and somewhat violent and aggressive.  He was last given Geodon about 14 hours ago and he is acting psychotic, hallucinating, and becoming a danger to himself and others.  I have ordered Geodon 20 mg intramuscular at this time.   Loleta Roseory Margarete Horace, MD 12/27/15 [phone]

## 2015-12-27 NOTE — ED Notes (Signed)
Dr.Clapacs at bedside  

## 2015-12-27 NOTE — ED Notes (Signed)
[facility], Care assumed.

## 2015-12-27 NOTE — ED Notes (Signed)
Hourly rounding reveals patient sleeping in room. No complaints, stable, in no acute distress. Q15 minute rounds and monitoring via Security Cameras to continue. 

## 2015-12-27 NOTE — ED Notes (Signed)
Pt. Transferred to BHU from ED to room 2. Report to include Situation, Background, Assessment and Recommendations from [facility]. Pt. Agitated threatening to "kill an acquaintance of his". Patient is alert, warm and dry in no acute distress. Patient refuses to answer questions and states that his name is "[name]". Redirection not effective. Pt. Encouraged to let me know if needs arise.
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
## 2015-12-28 DIAGNOSIS — F209 Schizophrenia, unspecified: Secondary | ICD-10-CM | POA: Diagnosis not present

## 2015-12-28 DIAGNOSIS — F203 Undifferentiated schizophrenia: Secondary | ICD-10-CM

## 2015-12-28 MED ORDER — OLANZAPINE 10 MG PO TABS
10.0000 mg | ORAL_TABLET | Freq: Once | ORAL | Status: DC
  Filled 2015-12-28: qty 1

## 2015-12-28 NOTE — ED Notes (Signed)
Hourly rounding reveals patient sleeping in room. No complaints, stable, in no acute distress. Q15 minute rounds and monitoring via Security Cameras to continue. 

## 2015-12-28 NOTE — ED Notes (Signed)
Patient discharged home. DC instructions given to group home. Pt stable at discharge. Pt at baseline

## 2015-12-28 NOTE — ED Notes (Signed)
Patient irritated, argumentative. Wanted to be discharged to take a flight to [place].

## 2015-12-28 NOTE — ED Notes (Signed)
Patient discharged back to group home

## 2015-12-28 NOTE — ED Notes (Signed)
Hourly rounding reveals patient in room. No complaints, stable, in no acute distress. Q15 minute rounds and monitoring via Security Cameras to continue. 

## 2015-12-28 NOTE — ED Notes (Signed)
ED Provider at bedside. 

## 2015-12-28 NOTE — ED Notes (Signed)
Patient tearful  at times. Reports [name] and [name] is his father. Reports that is name is [name] and that his parents are [name], and now his name is [name]. Pt is medication compliant. Paces occasionally around dayroom. Patient wants to be transferred to behavioral health unit for treatment. Fluids and nutrition offered. Pt remains safe on unit with 15 min checks.
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
## 2015-12-28 NOTE — Progress Notes (Signed)
LCSW received call from Group home and they will be here at 4pm to pick up patient and he will return to his group home. ED BHU nurse was notified.  Delta Air LinesClaudine Tashawnda Bleiler LCSW [phone]

## 2015-12-28 NOTE — Progress Notes (Signed)
LCSW called [facility] [phone] and left a message to have a return call.  Met with patient, he remains delusional at this time.  Called to see if this is patients baseline-and if he can return to group home once improved.  [employer] LCSW [phone]

## 2015-12-28 NOTE — ED Notes (Signed)
IVC  RESCINDED  PER  DR  CLAPACS

## 2015-12-28 NOTE — Discharge Instructions (Signed)
You have been seen in the Emergency Department (ED)  today for a psychiatric complaint.  You have been evaluated by psychiatry and we believe you are safe to be discharged from the hospital.   ° °Please return to the Emergency Department (ED)  immediately if you have ANY thoughts of hurting yourself or anyone else, so that we may help you. ° °Please avoid alcohol and drug use. ° °Follow up with your doctor and/or therapist as soon as possible regarding today's ED  visit.  ° °You may call crisis hotline for [place] at [phone]. ° °

## 2015-12-28 NOTE — ED Notes (Signed)
Patient in dayroom, excited about discharge

## 2015-12-28 NOTE — ED Notes (Signed)
Pt in bed resting, eyes closed, in no distress

## 2015-12-28 NOTE — ED Provider Notes (Signed)
-----------------------------------------   3:31 PM on 12/28/2015 -----------------------------------------   Blood pressure (!) 149/86, pulse 78, temperature 98.2 F (36.8 C), temperature source Oral, resp. rate 18, height 5\' 10"  (1.778 m), weight 145 lb (65.8 kg), SpO2 97 %.  Patient cleared by psychiatry for discharge home. IVC has been lifted by Dr. Toni Amendlapacs. Labs with no acute findigns.   Nita Sicklearolina Savan Ruta, MD 12/28/15 [phone]

## 2015-12-28 NOTE — ED Notes (Signed)
Patient in dayroom eating lunch in no apparent distress

## 2016-01-04 ENCOUNTER — Emergency Department
Admission: EM | Admit: 2016-01-04 | Discharge: 2016-01-05 | Disposition: A | Discharge status: Other Acute Inpatient Hospital | Payer: Medicare Other | Attending: Emergency Medicine | Admitting: Emergency Medicine

## 2016-01-04 DIAGNOSIS — Z79899 Other long term (current) drug therapy: Secondary | ICD-10-CM | POA: Diagnosis not present

## 2016-01-04 DIAGNOSIS — F17210 Nicotine dependence, cigarettes, uncomplicated: Secondary | ICD-10-CM | POA: Insufficient documentation

## 2016-01-04 DIAGNOSIS — F918 Other conduct disorders: Secondary | ICD-10-CM | POA: Diagnosis not present

## 2016-01-04 DIAGNOSIS — R4689 Other symptoms and signs involving appearance and behavior: Secondary | ICD-10-CM

## 2016-01-04 DIAGNOSIS — F309 Manic episode, unspecified: Secondary | ICD-10-CM | POA: Diagnosis present

## 2016-01-04 LAB — URINE DRUG SCREEN, QUALITATIVE (ARMC ONLY)
Amphetamines, Ur Screen: NOT DETECTED
BARBITURATES, UR SCREEN: NOT DETECTED
Benzodiazepine, Ur Scrn: NOT DETECTED
CANNABINOID 50 NG, UR ~~LOC~~: NOT DETECTED
COCAINE METABOLITE, UR ~~LOC~~: NOT DETECTED
MDMA (ECSTASY) UR SCREEN: NOT DETECTED
METHADONE SCREEN, URINE: NOT DETECTED
Opiate, Ur Screen: NOT DETECTED
Phencyclidine (PCP) Ur S: NOT DETECTED
TRICYCLIC, UR SCREEN: NOT DETECTED

## 2016-01-04 LAB — BASIC METABOLIC PANEL
ANION GAP: 7 (ref 5–15)
BUN: 12 mg/dL (ref 6–20)
CHLORIDE: 107 mmol/L (ref 101–111)
CO2: 26 mmol/L (ref 22–32)
Calcium: 9 mg/dL (ref 8.9–10.3)
Creatinine, Ser: 0.85 mg/dL (ref 0.61–1.24)
GFR calc Af Amer: >60 mL/min (ref >60)
GLUCOSE: 109 mg/dL — AB (ref 65–99)
POTASSIUM: 3.3 mmol/L — AB (ref 3.5–5.1)
Sodium: 140 mmol/L (ref 135–145)

## 2016-01-04 LAB — CBC
HCT: 37.3 % — ABNORMAL LOW (ref 40.0–52.0)
HEMOGLOBIN: 12.5 g/dL — AB (ref 13.0–18.0)
MCH: 32.1 pg (ref 26.0–34.0)
MCHC: 33.6 g/dL (ref 32.0–36.0)
MCV: 95.8 fL (ref 80.0–100.0)
Platelets: 210 10*3/uL (ref 150–440)
RBC: 3.89 MIL/uL — AB (ref 4.40–5.90)
RDW: 13.6 % (ref 11.5–14.5)
WBC: 5.6 10*3/uL (ref 3.8–10.6)

## 2016-01-04 LAB — ACETAMINOPHEN LEVEL: Acetaminophen (Tylenol), Serum: <10 ug/mL — ABNORMAL LOW (ref 10–30)

## 2016-01-04 LAB — ETHANOL

## 2016-01-04 LAB — SALICYLATE LEVEL

## 2016-01-04 MED ORDER — ZIPRASIDONE MESYLATE 20 MG IM SOLR
20.0000 mg | Freq: Once | INTRAMUSCULAR | Status: AC
  Administered 2016-01-04: 20 mg via INTRAMUSCULAR
  Filled 2016-01-04: qty 20

## 2016-01-04 MED ORDER — AMANTADINE HCL 100 MG PO CAPS: 100.0000 mg | ORAL_CAPSULE | Freq: Two times a day (BID) | ORAL | Status: DC

## 2016-01-04 MED ORDER — ACETAMINOPHEN 325 MG PO TABS: 650.0000 mg | ORAL_TABLET | Freq: Four times a day (QID) | ORAL | Status: DC | PRN

## 2016-01-04 MED ORDER — LORAZEPAM 2 MG/ML IJ SOLN
2.0000 mg | Freq: Once | INTRAMUSCULAR | Status: AC
  Administered 2016-01-04: 2 mg via INTRAMUSCULAR

## 2016-01-04 MED ORDER — DIPHENHYDRAMINE HCL 50 MG/ML IJ SOLN
50.0000 mg | Freq: Once | INTRAMUSCULAR | Status: AC
  Administered 2016-01-04: 50 mg via INTRAMUSCULAR

## 2016-01-04 MED ORDER — DIPHENHYDRAMINE HCL 50 MG/ML IJ SOLN
INTRAMUSCULAR | Status: AC
  Administered 2016-01-04: 50 mg via INTRAMUSCULAR
  Filled 2016-01-04: qty 1

## 2016-01-04 MED ORDER — RISPERIDONE 1 MG PO TABS
4.0000 mg | ORAL_TABLET | Freq: Two times a day (BID) | ORAL | Status: DC
  Administered 2016-01-04 (×2): 4 mg via ORAL
  Filled 2016-01-04 (×2): qty 4

## 2016-01-04 MED ORDER — RISPERIDONE 1 MG PO TABS: 4.0000 mg | ORAL_TABLET | Freq: Two times a day (BID) | ORAL | Status: DC

## 2016-01-04 MED ORDER — LORAZEPAM 2 MG/ML IJ SOLN
INTRAMUSCULAR | Status: AC
  Administered 2016-01-04: 2 mg via INTRAMUSCULAR
  Filled 2016-01-04: qty 1

## 2016-01-04 MED ORDER — ALUM & MAG HYDROXIDE-SIMETH 200-200-20 MG/5ML PO SUSP: 30.0000 mL | Freq: Four times a day (QID) | ORAL | Status: DC | PRN

## 2016-01-04 MED ORDER — AMANTADINE HCL 100 MG PO CAPS
100.0000 mg | ORAL_CAPSULE | Freq: Two times a day (BID) | ORAL | Status: DC
  Administered 2016-01-04 (×2): 100 mg via ORAL
  Filled 2016-01-04 (×2): qty 1

## 2016-01-04 NOTE — ED Notes (Signed)
Upon arrival to pt room, Dr. Don PerkingVeronese present and talking with patient.  Patient agreeing to calm down and rest at this time.  Will continue to monitor closely at this time.

## 2016-01-04 NOTE — ED Notes (Signed)
Pt ate breakfast.

## 2016-01-04 NOTE — ED Notes (Signed)

## 2016-01-04 NOTE — ED Notes (Signed)
Patient woke up and asked if he would be going to another facility tonight.  Pt. Told a place has not been found yet, and that we were still working on placement.  Pt. Indicated he would like to go to [facility] or [facility].

## 2016-01-04 NOTE — BH Assessment (Signed)
[facility] has been accepted [facility].  [facility] Accepting physician is Dr. Joaquin CourtsSaka Salana.  Call report to [phone].  Representative was [employer].  ER Staff is aware of it Carlisle Beers(Luann, ER Sect.; Dr. Fredrich RomansPaduchowki, ER MD & Amy T., Patient's Nurse).  Patient bed will be available tomorrow (01/05/2016). He can arrive anytime after 7:00am
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
## 2016-01-04 NOTE — ED Notes (Addendum)
Assessment completed  He denies pain  Pt talking loudly at times concerning he wants his name taken off his daughters birth certificate because she is living wrong   BEHAVIORAL HEALTH ROUNDING Patient sleeping: No. Patient alert and oriented:  Oriented to self and place  Behavior appropriate: Yes.  ; If no, describe:  Nutrition and fluids offered: yes Toileting and hygiene offered: Yes  Sitter present: q15 minute observations and security monitoring Law enforcement present: Yes  ODS

## 2016-01-04 NOTE — BH Assessment (Signed)
Per Arvil PersonsJason Barry, FNP - patient pending disposition by the Psychiatrist (Dr. Toni Amendlapacs).  Writer informed the ER RN.

## 2016-01-04 NOTE — ED Notes (Signed)
Patient observed lying in bed with eyes closed - head covered with the blanket   Even, unlabored respirations observed   NAD pt appears to be sleeping  I will continue to monitor along with every 15 minute visual observations and ongoing security  monitoring

## 2016-01-04 NOTE — ED Notes (Signed)
Pt remains agitated, screaming in room.  Pt speaking with [employer] officer at this time.

## 2016-01-04 NOTE — ED Provider Notes (Signed)
-----------------------------------------   7:52 PM on 01/04/2016 -----------------------------------------  Patient has been evaluated by psychiatry. [facility] We will arrange for transportation tomorrow.   Minna AntisKevin Patrik Turnbaugh, MD 01/04/16 [phone]
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
## 2016-01-04 NOTE — ED Notes (Signed)
BEHAVIORAL HEALTH ROUNDING Patient sleeping: Yes.   Patient alert and oriented: eyes closed  Appears to be asleep Behavior appropriate: Yes.  ; If no, describe:  Nutrition and fluids offered: Yes  Toileting and hygiene offered: sleeping Sitter present: q 15 minute observations and security monitoring Law enforcement present: yes  ODS 

## 2016-01-04 NOTE — ED Notes (Signed)
Pt remained agitated.  Dr. Don PerkingVeronese away from desk.  Dr. Manson PasseyBrown notified of pt's behavior and verbal orders given for benadryl 50mg  IM and ativan 2mg  IM.

## 2016-01-04 NOTE — ED Notes (Signed)
[facility]

## 2016-01-04 NOTE — BH Assessment (Signed)
Consult completed by [facility] (Ava S.)

## 2016-01-04 NOTE — ED Notes (Signed)
BEHAVIORAL HEALTH ROUNDING Patient sleeping: No. Patient alert and oriented:  Oriented to self place situation  Not time Behavior appropriate: Yes.  ; If no, describe:  Nutrition and fluids offered: yes Toileting and hygiene offered: Yes  Sitter present: q15 minute observations and security monitoring Law enforcement present: Yes  ODS

## 2016-01-04 NOTE — ED Triage Notes (Signed)
Pt here under involuntary commitment  
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
## 2016-01-04 NOTE — BH Assessment (Signed)
Referral information for Psychiatric Hospitalization faxed to;    [facility] [phone]) [address][phone]   [facility] [place] [place] [phone]),    [facility] [phone]),    [place] [phone] or [phone]),    [place] [phone]    [facility] [phone]   [facility] [phone]),    [place] [phone] or [phone]),    [facility] [phone]),    [facility] [place] [phone])    [facility] [phone]).

## 2016-01-04 NOTE — ED Notes (Addendum)
ED Is the patient under IVC or is there intent for IVC: Yes.   Is the patient medically cleared: Yes.   Is there vacancy in the ED BHU: Yes.   Is the population mix appropriate for patient: Yes.   Is the patient awaiting placement in inpatient or outpatient setting: .   Has the patient had a psychiatric consult:  SOC   Survey of unit performed for contraband, proper placement and condition of furniture, tampering with fixtures in bathroom, shower, and each patient room: Yes.  ; Findings:  APPEARANCE/BEHAVIOR Calm and cooperative NEURO ASSESSMENT Orientation: oriented to self, place   Denies pain Hallucinations: No.None noted (Hallucinations) Speech: Normal to loud Gait: normal RESPIRATORY ASSESSMENT Even  Unlabored respirations  CARDIOVASCULAR ASSESSMENT Pulses equal   regular rate  Skin warm and dry   GASTROINTESTINAL ASSESSMENT no GI complaint EXTREMITIES Full ROM  PLAN OF CARE Provide calm/safe environment. Vital signs assessed twice daily. ED BHU Assessment once each 12-hour shift. Collaborate with TTS daily or as condition indicates. Assure the ED provider has rounded once each shift. Provide and encourage hygiene. Provide redirection as needed. Assess for escalating behavior; address immediately and inform ED provider.  Assess family dynamic and appropriateness for visitation as needed: Yes.  ; If necessary, describe findings:  Educate the patient/family about BHU procedures/visitation: Yes.  ; If necessary, describe findings:

## 2016-01-04 NOTE — ED Notes (Signed)
Patient observed lying in bed with eyes closed  Even, unlabored respirations observed   NAD pt appears to be sleeping  I will continue to monitor along with every 15 minute visual observations and ongoing security monitoring    Report given to rover

## 2016-01-04 NOTE — ED Notes (Signed)
Pt agitated and screaming obscenities at staff.  Pt pacing room and states "I'm [name]'s son, you can't do anything to me".  Pt visibly agitated.  EDP notified and medication orders placed.

## 2016-01-04 NOTE — ED Notes (Signed)
[facility]  [phone]
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
## 2016-01-05 NOTE — ED Notes (Signed)
[facility]  They are aware of transport should be after [phone]

## 2016-01-05 NOTE — ED Notes (Signed)
Pt. Woke up.  Pt. Asking where he will be going, pt. Reassured that we are looking at available locations.  Pt. Given drink, pt. Agreed to go back to sleep after drink.

## 2016-01-05 NOTE — BH Assessment (Signed)
Spoke with [facility] ([phone]) and patient is still good to transport to their facility.  Writer updated ER [facility] Beers(Luann).

## 2016-02-21 ENCOUNTER — Encounter: Payer: Self-pay | Admitting: Emergency Medicine

## 2016-02-21 ENCOUNTER — Emergency Department
Admission: EM | Admit: 2016-02-21 | Discharge: 2016-02-21 | Disposition: A | Discharge status: Home / Self Care | Payer: Medicare Other | Attending: Emergency Medicine | Admitting: Emergency Medicine

## 2016-02-21 DIAGNOSIS — F17210 Nicotine dependence, cigarettes, uncomplicated: Secondary | ICD-10-CM | POA: Insufficient documentation

## 2016-02-21 DIAGNOSIS — Z79899 Other long term (current) drug therapy: Secondary | ICD-10-CM | POA: Diagnosis not present

## 2016-02-21 DIAGNOSIS — F209 Schizophrenia, unspecified: Secondary | ICD-10-CM | POA: Insufficient documentation

## 2016-02-21 DIAGNOSIS — F203 Undifferentiated schizophrenia: Secondary | ICD-10-CM | POA: Diagnosis not present

## 2016-02-21 DIAGNOSIS — F17200 Nicotine dependence, unspecified, uncomplicated: Secondary | ICD-10-CM | POA: Diagnosis present

## 2016-02-21 DIAGNOSIS — Z046 Encounter for general psychiatric examination, requested by authority: Secondary | ICD-10-CM | POA: Diagnosis present

## 2016-02-21 LAB — COMPREHENSIVE METABOLIC PANEL
ALBUMIN: 4.1 g/dL (ref 3.5–5.0)
ALK PHOS: 61 U/L (ref 38–126)
ALT: 10 U/L — ABNORMAL LOW (ref 17–63)
ANION GAP: 8 (ref 5–15)
AST: 25 U/L (ref 15–41)
BILIRUBIN TOTAL: 0.6 mg/dL (ref 0.3–1.2)
BUN: 8 mg/dL (ref 6–20)
CALCIUM: 9.3 mg/dL (ref 8.9–10.3)
CO2: 27 mmol/L (ref 22–32)
Chloride: 108 mmol/L (ref 101–111)
Creatinine, Ser: 0.91 mg/dL (ref 0.61–1.24)
GFR calc Af Amer: >60 mL/min (ref >60)
GLUCOSE: 95 mg/dL (ref 65–99)
Potassium: 3.4 mmol/L — ABNORMAL LOW (ref 3.5–5.1)
Sodium: 143 mmol/L (ref 135–145)
TOTAL PROTEIN: 8 g/dL (ref 6.5–8.1)

## 2016-02-21 LAB — CBC WITH DIFFERENTIAL/PLATELET
BASOS ABS: 0 10*3/uL (ref 0–0.1)
BASOS PCT: 0 %
EOS ABS: 0 10*3/uL (ref 0–0.7)
EOS PCT: 1 %
HCT: 38 % — ABNORMAL LOW (ref 40.0–52.0)
Hemoglobin: 12.6 g/dL — ABNORMAL LOW (ref 13.0–18.0)
Lymphocytes Relative: 15 %
Lymphs Abs: 0.9 10*3/uL — ABNORMAL LOW (ref 1.0–3.6)
MCH: 32.9 pg (ref 26.0–34.0)
MCHC: 33.1 g/dL (ref 32.0–36.0)
MCV: 99.3 fL (ref 80.0–100.0)
MONO ABS: 0.5 10*3/uL (ref 0.2–1.0)
MONOS PCT: 9 %
Neutro Abs: 4.3 10*3/uL (ref 1.4–6.5)
Neutrophils Relative %: 75 %
PLATELETS: 194 10*3/uL (ref 150–440)
RBC: 3.83 MIL/uL — ABNORMAL LOW (ref 4.40–5.90)
RDW: 14.2 % (ref 11.5–14.5)
WBC: 5.7 10*3/uL (ref 3.8–10.6)

## 2016-02-21 LAB — ETHANOL

## 2016-02-21 LAB — URINE DRUG SCREEN, QUALITATIVE (ARMC ONLY)
AMPHETAMINES, UR SCREEN: NOT DETECTED
BARBITURATES, UR SCREEN: NOT DETECTED
BENZODIAZEPINE, UR SCRN: NOT DETECTED
Cannabinoid 50 Ng, Ur ~~LOC~~: NOT DETECTED
Cocaine Metabolite,Ur ~~LOC~~: NOT DETECTED
MDMA (Ecstasy)Ur Screen: NOT DETECTED
Methadone Scn, Ur: NOT DETECTED
OPIATE, UR SCREEN: NOT DETECTED
Phencyclidine (PCP) Ur S: NOT DETECTED
Tricyclic, Ur Screen: NOT DETECTED

## 2016-02-21 LAB — VALPROIC ACID LEVEL: VALPROIC ACID LVL: 98 ug/mL (ref 50.0–100.0)

## 2016-02-21 LAB — ACETAMINOPHEN LEVEL: Acetaminophen (Tylenol), Serum: <10 ug/mL — ABNORMAL LOW (ref 10–30)

## 2016-02-21 LAB — SALICYLATE LEVEL

## 2016-02-21 MED ORDER — ZIPRASIDONE MESYLATE 20 MG IM SOLR
20.0000 mg | Freq: Once | INTRAMUSCULAR | Status: AC
  Administered 2016-02-21: 20 mg via INTRAMUSCULAR
  Filled 2016-02-21: qty 20

## 2016-02-21 NOTE — ED Notes (Signed)
PT IVC PENDING CONSULT  

## 2016-02-21 NOTE — ED Notes (Signed)
BEHAVIORAL HEALTH ROUNDING Patient sleeping: Yes.   Patient alert and oriented: eyes closed  Appears to be asleep Behavior appropriate: Yes.  ; If no, describe:  Nutrition and fluids offered: Yes  Toileting and hygiene offered: sleeping Sitter present: q 15 minute observations and security monitoring Law enforcement present: yes  ODS 
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
## 2016-02-21 NOTE — ED Notes (Addendum)

## 2016-02-21 NOTE — Discharge Instructions (Signed)
Return to the emergency department for any new or worsening symptoms such as chest pain shortness of breath abdominal pain nausea or vomiting. Please continue taking your medications as prescribed and follow up with your primary care physician and psychiatrist within 1 week.

## 2016-02-21 NOTE — ED Notes (Signed)
Urinal provided.

## 2016-02-21 NOTE — ED Notes (Signed)
Counselor has spoken with Mar[facility] @ the pts [facility]. Staff have been informed that the pt is ready for discharge. ETA 2: 00 pm.
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
## 2016-02-21 NOTE — ED Triage Notes (Signed)
[facility] d/t being combative and [facility] to harm [facility]. Pt. States his actions are d/t not having cigarettes this morning and nicotine fits'

## 2016-02-21 NOTE — ED Notes (Signed)
BEHAVIORAL HEALTH ROUNDING Patient sleeping: No. Patient alert and oriented: yes Behavior appropriate: Yes.  ; If no, describe:  Nutrition and fluids offered: yes Toileting and hygiene offered: Yes  Sitter present: q15 minute observations and security monitoring Law enforcement present: Yes  ODS   Plan is to discharge back to his group home  Pt verbalizes agreement with the plan

## 2016-04-12 ENCOUNTER — Emergency Department
Admission: EM | Admit: 2016-04-12 | Discharge: 2016-04-14 | Disposition: A | Discharge status: Home / Self Care | Payer: Medicare Other | Attending: Emergency Medicine | Admitting: Emergency Medicine

## 2016-04-12 DIAGNOSIS — R258 Other abnormal involuntary movements: Secondary | ICD-10-CM | POA: Diagnosis present

## 2016-04-12 DIAGNOSIS — F203 Undifferentiated schizophrenia: Secondary | ICD-10-CM | POA: Diagnosis not present

## 2016-04-12 DIAGNOSIS — F17200 Nicotine dependence, unspecified, uncomplicated: Secondary | ICD-10-CM | POA: Diagnosis present

## 2016-04-12 DIAGNOSIS — F17210 Nicotine dependence, cigarettes, uncomplicated: Secondary | ICD-10-CM | POA: Insufficient documentation

## 2016-04-12 DIAGNOSIS — Z79899 Other long term (current) drug therapy: Secondary | ICD-10-CM | POA: Diagnosis not present

## 2016-04-12 DIAGNOSIS — F209 Schizophrenia, unspecified: Secondary | ICD-10-CM | POA: Diagnosis not present

## 2016-04-12 LAB — CBC
HEMATOCRIT: 40.5 % (ref 40.0–52.0)
HEMOGLOBIN: 13.1 g/dL (ref 13.0–18.0)
MCH: 31.6 pg (ref 26.0–34.0)
MCHC: 32.5 g/dL (ref 32.0–36.0)
MCV: 97.3 fL (ref 80.0–100.0)
Platelets: 246 10*3/uL (ref 150–440)
RBC: 4.16 MIL/uL — AB (ref 4.40–5.90)
RDW: 13.2 % (ref 11.5–14.5)
WBC: 6.9 10*3/uL (ref 3.8–10.6)

## 2016-04-12 LAB — COMPREHENSIVE METABOLIC PANEL
ALT: 9 U/L — AB (ref 17–63)
ANION GAP: 10 (ref 5–15)
AST: 24 U/L (ref 15–41)
Albumin: 4.5 g/dL (ref 3.5–5.0)
Alkaline Phosphatase: 71 U/L (ref 38–126)
BUN: 7 mg/dL (ref 6–20)
CO2: 26 mmol/L (ref 22–32)
CREATININE: 0.85 mg/dL (ref 0.61–1.24)
Calcium: 9.4 mg/dL (ref 8.9–10.3)
Chloride: 104 mmol/L (ref 101–111)
Glucose, Bld: 122 mg/dL — ABNORMAL HIGH (ref 65–99)
POTASSIUM: 3.3 mmol/L — AB (ref 3.5–5.1)
SODIUM: 140 mmol/L (ref 135–145)
Total Bilirubin: 0.5 mg/dL (ref 0.3–1.2)
Total Protein: 8.3 g/dL — ABNORMAL HIGH (ref 6.5–8.1)

## 2016-04-12 LAB — URINALYSIS, ROUTINE W REFLEX MICROSCOPIC
Bilirubin Urine: NEGATIVE
Glucose, UA: NEGATIVE mg/dL
HGB URINE DIPSTICK: NEGATIVE
Ketones, ur: NEGATIVE mg/dL
Leukocytes, UA: NEGATIVE
Nitrite: NEGATIVE
Protein, ur: NEGATIVE mg/dL
SPECIFIC GRAVITY, URINE: 1.008 (ref 1.005–1.030)
pH: 6 (ref 5.0–8.0)

## 2016-04-12 LAB — URINE DRUG SCREEN, QUALITATIVE (ARMC ONLY)
Amphetamines, Ur Screen: NOT DETECTED
BARBITURATES, UR SCREEN: NOT DETECTED
BENZODIAZEPINE, UR SCRN: NOT DETECTED
CANNABINOID 50 NG, UR ~~LOC~~: NOT DETECTED
Cocaine Metabolite,Ur ~~LOC~~: NOT DETECTED
MDMA (Ecstasy)Ur Screen: NOT DETECTED
Methadone Scn, Ur: NOT DETECTED
OPIATE, UR SCREEN: NOT DETECTED
PHENCYCLIDINE (PCP) UR S: NOT DETECTED
Tricyclic, Ur Screen: NOT DETECTED

## 2016-04-12 LAB — ACETAMINOPHEN LEVEL: Acetaminophen (Tylenol), Serum: <10 ug/mL — ABNORMAL LOW (ref 10–30)

## 2016-04-12 LAB — SALICYLATE LEVEL: Salicylate Lvl: <7 mg/dL (ref 2.8–30.0)

## 2016-04-12 LAB — ETHANOL

## 2016-04-12 NOTE — ED Notes (Signed)
Pt arrived with IVC papers accompanied by sheriff's deputies x 2; cuffs removed upon arrival; pt agitated but not combative at this time
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
## 2016-04-12 NOTE — ED Notes (Signed)
Report given to [facility] MD and camera set up in room.

## 2016-04-12 NOTE — ED Triage Notes (Signed)
Patient to ED via [facility] with IVC papers that state patient is schizophrenic and needs to be evaluated per [facility]' assessment. Patient with flight of ideas and grandiose thoughts. Claims he is [name], and then states he is son of God. Patient is cooperative at this time

## 2016-04-13 DIAGNOSIS — F209 Schizophrenia, unspecified: Secondary | ICD-10-CM | POA: Diagnosis not present

## 2016-04-13 MED ORDER — ZIPRASIDONE HCL 20 MG PO CAPS
20.0000 mg | ORAL_CAPSULE | Freq: Two times a day (BID) | ORAL | Status: DC | PRN
  Administered 2016-04-13 (×2): 20 mg via ORAL
  Filled 2016-04-13 (×3): qty 1

## 2016-04-13 MED ORDER — ZIPRASIDONE MESYLATE 20 MG IM SOLR
20.0000 mg | Freq: Once | INTRAMUSCULAR | Status: AC
  Administered 2016-04-13: 20 mg via INTRAMUSCULAR
  Filled 2016-04-13: qty 20

## 2016-04-13 MED ORDER — FOLIC ACID 1 MG PO TABS
1.0000 mg | ORAL_TABLET | Freq: Every day | ORAL | Status: DC
  Administered 2016-04-14: 1 mg via ORAL
  Filled 2016-04-13: qty 1

## 2016-04-13 MED ORDER — ACETAMINOPHEN 325 MG PO TABS: 650.0000 mg | ORAL_TABLET | Freq: Three times a day (TID) | ORAL | Status: DC | PRN

## 2016-04-13 MED ORDER — ALUM & MAG HYDROXIDE-SIMETH 200-200-20 MG/5ML PO SUSP: 30.0000 mL | Freq: Four times a day (QID) | ORAL | Status: DC | PRN

## 2016-04-13 MED ORDER — MIDAZOLAM HCL 2 MG/2ML IJ SOLN
2.0000 mg | Freq: Once | INTRAMUSCULAR | Status: AC
  Administered 2016-04-13: 2 mg via INTRAMUSCULAR
  Filled 2016-04-13: qty 2

## 2016-04-13 MED ORDER — LORAZEPAM 2 MG PO TABS
2.0000 mg | ORAL_TABLET | Freq: Four times a day (QID) | ORAL | Status: DC | PRN
  Administered 2016-04-13 – 2016-04-14 (×4): 2 mg via ORAL
  Filled 2016-04-13 (×4): qty 1

## 2016-04-13 NOTE — ED Notes (Signed)
Pt given another cup of orange juice and graham crackers. Pt also asking for a sandwich but made aware that he just had breakfast and graham crackers and will get more food in a couple hours at lunch time.

## 2016-04-13 NOTE — ED Notes (Signed)
ED BHU PLACEMENT JUSTIFICATION Is the patient under IVC or is there intent for IVC: Yes.   Is the patient medically cleared: Yes.   Is there vacancy in the ED BHU: Yes.   Is the population mix appropriate for patient: Yes.   Is the patient awaiting placement in inpatient or outpatient setting: Yes.   Has the patient had a psychiatric consult: Yes.   Survey of unit performed for contraband, proper placement and condition of furniture, tampering with fixtures in bathroom, shower, and each patient room: Yes.  ; Findings: NA APPEARANCE/BEHAVIOR cooperative NEURO ASSESSMENT Orientation: time and place Hallucinations: Yes.  Auditory Hallucinations and Visual Hallucinations Speech: Normal Gait: normal RESPIRATORY ASSESSMENT Normal expansion.  Clear to auscultation.  No rales, rhonchi, or wheezing. CARDIOVASCULAR ASSESSMENT regular rate and rhythm, S1, S2 normal, no murmur, click, rub or gallop GASTROINTESTINAL ASSESSMENT soft, nontender, BS WNL, no r/g EXTREMITIES normal strength, tone, and muscle mass PLAN OF CARE Provide calm/safe environment. Vital signs assessed twice daily. ED BHU Assessment once each 12-hour shift. Collaborate with intake RN daily or as condition indicates. Assure the ED provider has rounded once each shift. Provide and encourage hygiene. Provide redirection as needed. Assess for escalating behavior; address immediately and inform ED provider.  Assess family dynamic and appropriateness for visitation as needed: Yes.  ; If necessary, describe findings: NA Educate the patient/family about BHU procedures/visitation: Yes.  ; If necessary, describe findings: NA

## 2016-04-13 NOTE — ED Notes (Signed)
Pt given breakfast tray and an extra juice.

## 2016-04-13 NOTE — ED Notes (Signed)
Pt given warm blanket.
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
## 2016-04-13 NOTE — ED Notes (Signed)
Pt resting in bed, eeys closed resp even and unlabored

## 2016-04-13 NOTE — ED Notes (Addendum)
BEHAVIORAL HEALTH ROUNDING  Patient sleeping: No.  Patient alert and oriented: yes  Behavior appropriate: Yes. ; If no, describe:  Nutrition and fluids offered: Yes  Toileting and hygiene offered: Yes  Sitter present: not applicable, Q 15 min safety rounds and observation.  Law enforcement present: Yes ODS  

## 2016-04-13 NOTE — ED Notes (Signed)
Pt resting in bed, eyes closed, resp even and unlabored

## 2016-04-13 NOTE — ED Notes (Signed)
[name] has been calm and cooperative this shift. He has been asking where he will be sent next and expressing frustration when this writer is not able to provide a definitive answer. Prior to dinner, he said he would go on a hunger strike. (He ate his dinner.) His speech is disorganized and, at times, difficult to understand. He remains safe and free from harm. Will continue to monitor for needs and safety.

## 2016-04-13 NOTE — ED Notes (Signed)
In room to give patient medications and pt states "here just given me the shots in my arm, no given them in my butt." Pt dropped his pants and leaned over the bed to be given injections of ordered medications.

## 2016-04-13 NOTE — ED Notes (Signed)
BEHAVIORAL HEALTH ROUNDING Patient sleeping: Yes.   Patient alert and oriented: not applicable SLEEPING Behavior appropriate: Yes.  ; If no, describe: SLEEPING Nutrition and fluids offered: No SLEEPING Toileting and hygiene offered: NoSLEEPING Sitter present: not applicable, Q 15 min safety rounds and observation. Law enforcement present: Yes ODS 

## 2016-04-13 NOTE — ED Notes (Signed)
BEHAVIORAL HEALTH ROUNDING  Patient sleeping: No.  Patient alert and oriented: yes  Behavior appropriate: Yes. ; If no, describe:  Nutrition and fluids offered: Yes  Toileting and hygiene offered: Yes  Sitter present: not applicable, Q 15 min safety rounds and observation.  Law enforcement present: Yes ODS  

## 2016-04-13 NOTE — ED Notes (Signed)
After medications given pt states "she just wanted to see my penis". Requested pt to pull up his pants and then pt said "no one wants to see your pussy either you whore". Pt then pulled up his pants and sat down on the bed.

## 2016-04-13 NOTE — BH Assessment (Addendum)
Patient has been accepted to [facility].  Patient assigned to room [facility], [place], [place] [address]) Accepting physician is Dr. Annye English.  Call report to [phone].  Representative was [employer].  ER Staff is aware of it Maia Breslow, ER Sect.; Dr. Merrily Brittle, ER MD & Clydie Braun, Patient's Nurse).  Patient bed will be available tomorrow (04/14/2016) anytime after he receives his breakfast.

## 2016-04-13 NOTE — ED Notes (Signed)
Pt resting in bed, denies any needs, lights dimmed

## 2016-04-13 NOTE — ED Notes (Signed)
TTS at bedside. 

## 2016-04-13 NOTE — ED Notes (Signed)
Pt remains awake but is no longer talking loudly or aggressively. Will continue to monitor.
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
## 2016-04-13 NOTE — ED Notes (Signed)
ENVIRONMENTAL ASSESSMENT  Potentially harmful objects out of patient reach: Yes.  Personal belongings secured: Yes.  Patient dressed in hospital provided attire only: Yes.  Plastic bags out of patient reach: Yes.  Patient care equipment (cords, cables, call bells, lines, and drains) shortened, removed, or accounted for: Yes.  Equipment and supplies removed from bottom of stretcher: Yes.  Potentially toxic materials out of patient reach: Yes.  Sharps container removed or out of patient reach: Yes.   BEHAVIORAL HEALTH ROUNDING  Patient sleeping: No.  Patient alert and oriented: yes  Behavior appropriate: No. ; If no, describe: loud speech, inappropriate language, paranoid.  Nutrition and fluids offered: Yes  Toileting and hygiene offered: Yes  Sitter present: not applicable, Q 15 min safety rounds and observation.  Law enforcement present: Yes ODS

## 2016-04-13 NOTE — BH Assessment (Addendum)
Referral information for Psychiatric Hospitalization faxed to;    [facility] [phone])   [facility] [phone]),    [facility] [phone]),    [address] [phone]),    [facility] [phone]),    [facility] [phone]),    [facility] [phone])   [facility] [phone]).   [facility] [phone])   [facility]-[phone])   [facility]   [place] [phone])   [facility])

## 2016-04-13 NOTE — ED Notes (Signed)
BEHAVIORAL HEALTH ROUNDING  Patient sleeping: No.  Patient alert and oriented: yes  Behavior appropriate: no. ; If no, describe: continues to talk loudly and appears to be responding to internal stimuli.  Nutrition and fluids offered: Yes  Toileting and hygiene offered: Yes  Sitter present: not applicable, Q 15 min safety rounds and observation.  Law enforcement present: Yes ODS

## 2016-04-13 NOTE — ED Notes (Signed)
During [facility] consult pt became excessively loud and verbally aggressive towards the computer and the [facility] MD. Camera removed from the room and pt encouraged to calm down and sit on the bed. Pt sat down on the bed but continued to talk loudly.

## 2016-04-13 NOTE — ED Notes (Signed)
SOC MD called and requested pt to be given medication to help calm him down. SOC MD recommended geodon and ativan. Dr York Cerise informed.

## 2016-04-13 NOTE — ED Notes (Signed)
Pt continues to talk loudly but is becoming more verbally aggressive. Will give him his prn medications.

## 2016-04-13 NOTE — ED Notes (Signed)
Pt talking loudly with staff at this time. Pt encouraged to lower his tone of voice. Pt brought in from group home for evaluation due to 2 to 3 days of increasing hallucinations, being delusional, paranoid and acting sexually inappropriate.

## 2016-04-13 NOTE — ED Notes (Signed)
BEHAVIORAL HEALTH ROUNDING  Patient sleeping: No.  Patient alert and oriented: yes  Behavior appropriate: No ; If no, describe: appears to be responding to internal stimuli. Talking loudly.  Nutrition and fluids offered: Yes  Toileting and hygiene offered: Yes  Sitter present: not applicable, Q 15 min safety rounds and observation.  Law enforcement present: Yes ODS

## 2016-04-13 NOTE — ED Notes (Signed)

## 2016-04-13 NOTE — ED Notes (Signed)
Pt awake, loudly talking to himself. Pt asked who he is talking to and he states he wants to go to a black hospital. Pt states he does not like [facility] and he wants "that white mans name taken off my birth certificate.". Explained to patient that there is no race discrimination allowed here. Pt asking to call the caregiver at his group home to see if he can come back there. Explained to patient that it is 420am and he would need to wait until around 9 am to make a phone cal. Pt states ok.

## 2016-04-14 DIAGNOSIS — F203 Undifferentiated schizophrenia: Secondary | ICD-10-CM

## 2016-04-14 DIAGNOSIS — F209 Schizophrenia, unspecified: Secondary | ICD-10-CM | POA: Diagnosis not present

## 2016-04-14 MED ORDER — DIVALPROEX SODIUM 250 MG PO DR TAB: 250.0000 mg | DELAYED_RELEASE_TABLET | ORAL | Status: DC

## 2016-04-14 MED ORDER — RISPERIDONE 3 MG PO TABS: 3.0000 mg | ORAL_TABLET | Freq: Every day | ORAL | Status: DC

## 2016-04-14 MED ORDER — RISPERIDONE 1 MG PO TABS
2.0000 mg | ORAL_TABLET | Freq: Every day | ORAL | Status: DC
  Administered 2016-04-14: 2 mg via ORAL
  Filled 2016-04-14: qty 2

## 2016-04-14 MED ORDER — DIVALPROEX SODIUM 500 MG PO DR TAB: 750.0000 mg | DELAYED_RELEASE_TABLET | Freq: Every day | ORAL | Status: DC

## 2016-04-14 MED ORDER — LITHIUM CARBONATE ER 300 MG PO TBCR
300.0000 mg | EXTENDED_RELEASE_TABLET | Freq: Two times a day (BID) | ORAL | Status: DC
  Administered 2016-04-14: 300 mg via ORAL
  Filled 2016-04-14: qty 1

## 2016-04-14 MED ORDER — QUETIAPINE FUMARATE 300 MG PO TABS: 300.0000 mg | ORAL_TABLET | Freq: Every day | ORAL | Status: DC

## 2016-04-14 NOTE — ED Provider Notes (Signed)
-----------------------------------------   7:18 AM on 04/14/2016 -----------------------------------------   Blood pressure (!) 127/94, pulse (!) 18, temperature 98.4 F (36.9 C), temperature source Oral, resp. rate 18, height  (1.676 m), weight 147 lb (66.7 kg), SpO2 99 %.  The patient had no acute events since last update.  Calm and cooperative at this time.  [facility]     Rebecka Apley, MD 04/14/16 [phone]
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
## 2016-04-14 NOTE — BH Assessment (Signed)
Per Dr. Toni Amend - patient no longer meets criteria for inpatient hospitalization.  TTS contacted the ACTT Team and spoke to [employer] [phone]).  Writer coordinated with the [facility] regarding a time that they are able to pick up the patient.  Per Marlette at the [facility] she will have to call back with a time that a worker is able to come to the ED and pick up the patient.   Writer informed [facility] that the patient no longer needs the bed.
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
## 2016-04-14 NOTE — ED Notes (Signed)
Patient calm and cooperative at this time.  Requesting the telephone. Informed him it would be available at 0900.  Patient states, "my sister is white.  My mother is white."  Patient inquired about where he was going.  Informed him he may be transported to [facility] at some point today.  Awaiting admissions to give a call back.  Was informed by Donnie Aho at [facility] they do not have a male bed at this time.  Patient states he has not thoughts of self harm or of hurting anyone else.  Patient denies AVH.  He is visible by security camera and he is ambulatory.

## 2016-04-14 NOTE — Discharge Instructions (Signed)
Return to the emergency department for thoughts of hurting yourself or anyone else, hallucinations, or any other symptoms concerning to you.

## 2016-04-14 NOTE — ED Notes (Signed)
IVC  RESCINDED  PER  DR  CLAPACS INFORMED  RN  RUTHIE

## 2016-04-14 NOTE — ED Notes (Signed)
Attempted to call report to [facility].  Left voice mail.

## 2016-04-26 ENCOUNTER — Encounter: Payer: Self-pay | Admitting: Emergency Medicine

## 2016-04-26 ENCOUNTER — Emergency Department
Admission: EM | Admit: 2016-04-26 | Discharge: 2016-04-26 | Disposition: A | Discharge status: Home / Self Care | Payer: Medicare Other | Attending: Emergency Medicine | Admitting: Emergency Medicine

## 2016-04-26 DIAGNOSIS — F209 Schizophrenia, unspecified: Secondary | ICD-10-CM | POA: Insufficient documentation

## 2016-04-26 DIAGNOSIS — R4689 Other symptoms and signs involving appearance and behavior: Secondary | ICD-10-CM

## 2016-04-26 DIAGNOSIS — Z79899 Other long term (current) drug therapy: Secondary | ICD-10-CM | POA: Diagnosis not present

## 2016-04-26 DIAGNOSIS — F17210 Nicotine dependence, cigarettes, uncomplicated: Secondary | ICD-10-CM | POA: Insufficient documentation

## 2016-04-26 DIAGNOSIS — Z046 Encounter for general psychiatric examination, requested by authority: Secondary | ICD-10-CM | POA: Diagnosis present

## 2016-04-26 LAB — COMPREHENSIVE METABOLIC PANEL
ALBUMIN: 4.1 g/dL (ref 3.5–5.0)
ALK PHOS: 69 U/L (ref 38–126)
ALT: 10 U/L — AB (ref 17–63)
AST: 24 U/L (ref 15–41)
Anion gap: 7 (ref 5–15)
BILIRUBIN TOTAL: 0.6 mg/dL (ref 0.3–1.2)
BUN: 12 mg/dL (ref 6–20)
CO2: 26 mmol/L (ref 22–32)
Calcium: 9.2 mg/dL (ref 8.9–10.3)
Chloride: 106 mmol/L (ref 101–111)
Creatinine, Ser: 1 mg/dL (ref 0.61–1.24)
GFR calc Af Amer: >60 mL/min (ref >60)
GLUCOSE: 203 mg/dL — AB (ref 65–99)
POTASSIUM: 3.5 mmol/L (ref 3.5–5.1)
Sodium: 139 mmol/L (ref 135–145)
Total Protein: 7.7 g/dL (ref 6.5–8.1)

## 2016-04-26 LAB — CBC
HEMATOCRIT: 39.3 % — AB (ref 40.0–52.0)
Hemoglobin: 13.3 g/dL (ref 13.0–18.0)
MCH: 33.1 pg (ref 26.0–34.0)
MCHC: 33.8 g/dL (ref 32.0–36.0)
MCV: 98.2 fL (ref 80.0–100.0)
Platelets: 226 10*3/uL (ref 150–440)
RBC: 4 MIL/uL — ABNORMAL LOW (ref 4.40–5.90)
RDW: 13.1 % (ref 11.5–14.5)
WBC: 7.7 10*3/uL (ref 3.8–10.6)

## 2016-04-26 LAB — ETHANOL: Alcohol, Ethyl (B): <5 mg/dL (ref <5)

## 2016-04-26 LAB — SALICYLATE LEVEL

## 2016-04-26 LAB — ACETAMINOPHEN LEVEL

## 2016-04-26 MED ORDER — RISPERIDONE 1 MG PO TABS
3.0000 mg | ORAL_TABLET | Freq: Once | ORAL | Status: AC
  Administered 2016-04-26: 3 mg via ORAL
  Filled 2016-04-26: qty 3

## 2016-04-26 NOTE — ED Notes (Signed)

## 2016-04-26 NOTE — ED Notes (Signed)
Patient awaiting IVC reversal for discharge
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
## 2016-04-26 NOTE — ED Notes (Signed)
SOC @ bedside

## 2016-04-26 NOTE — ED Triage Notes (Signed)
Pt to ed with IVC papers. Per papers pt has been aggressive and threatening to staff.  Pt rambling words yet follows commands at this time.

## 2016-04-26 NOTE — ED Notes (Signed)
[facility] consultant contacted me prior to contacting patient

## 2016-04-26 NOTE — ED Provider Notes (Signed)
Patient was cleared by telepsychiatry for discharge. He appears medically stable for same.    Jonathan Emily Filbert4/21/18 [phone]

## 2016-04-26 NOTE — ED Notes (Signed)
Spoke with Quillian Quince at [employer] and she arranged for a cab to pick up patient.  Her number is [phone]
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
## 2016-04-26 NOTE — ED Notes (Signed)
Patient was given a piece of paper to give to the cab driver which stated to not stop and only go to the address on the piece of paper per [place]

## 2016-04-26 NOTE — ED Notes (Signed)
Patient is asking for hamburger and hot food, I told him the cafeteria would be delivering hot meals soon and he needed to wait.  I set the TV on to a movie and he said he would probably take a nap.

## 2016-06-01 ENCOUNTER — Emergency Department
Admission: EM | Admit: 2016-06-01 | Discharge: 2016-06-02 | Disposition: A | Discharge status: Home / Self Care | Payer: Medicare Other | Attending: Emergency Medicine | Admitting: Emergency Medicine

## 2016-06-01 DIAGNOSIS — F209 Schizophrenia, unspecified: Secondary | ICD-10-CM | POA: Diagnosis not present

## 2016-06-01 DIAGNOSIS — F203 Undifferentiated schizophrenia: Secondary | ICD-10-CM | POA: Diagnosis not present

## 2016-06-01 DIAGNOSIS — R451 Restlessness and agitation: Secondary | ICD-10-CM | POA: Diagnosis present

## 2016-06-01 DIAGNOSIS — Z5181 Encounter for therapeutic drug level monitoring: Secondary | ICD-10-CM | POA: Diagnosis not present

## 2016-06-01 DIAGNOSIS — R410 Disorientation, unspecified: Secondary | ICD-10-CM

## 2016-06-01 DIAGNOSIS — F17210 Nicotine dependence, cigarettes, uncomplicated: Secondary | ICD-10-CM | POA: Insufficient documentation

## 2016-06-01 DIAGNOSIS — Z9119 Patient's noncompliance with other medical treatment and regimen: Secondary | ICD-10-CM

## 2016-06-01 DIAGNOSIS — Z91199 Patient's noncompliance with other medical treatment and regimen due to unspecified reason: Secondary | ICD-10-CM

## 2016-06-01 LAB — URINALYSIS, COMPLETE (UACMP) WITH MICROSCOPIC
Bilirubin Urine: NEGATIVE
GLUCOSE, UA: NEGATIVE mg/dL
Hgb urine dipstick: NEGATIVE
KETONES UR: 5 mg/dL — AB
Leukocytes, UA: NEGATIVE
NITRITE: NEGATIVE
PH: 5 (ref 5.0–8.0)
Protein, ur: 30 mg/dL — AB
Specific Gravity, Urine: 1.024 (ref 1.005–1.030)

## 2016-06-01 LAB — HEPATIC FUNCTION PANEL
ALT: 13 U/L — ABNORMAL LOW (ref 17–63)
AST: 35 U/L (ref 15–41)
Albumin: 4.2 g/dL (ref 3.5–5.0)
Alkaline Phosphatase: 64 U/L (ref 38–126)
BILIRUBIN TOTAL: 0.6 mg/dL (ref 0.3–1.2)
Total Protein: 7.9 g/dL (ref 6.5–8.1)

## 2016-06-01 LAB — CBC WITH DIFFERENTIAL/PLATELET
BASOS ABS: 0 10*3/uL (ref 0–0.1)
BASOS PCT: 0 %
EOS ABS: 0.1 10*3/uL (ref 0–0.7)
Eosinophils Relative: 1 %
HCT: 36.9 % — ABNORMAL LOW (ref 40.0–52.0)
HEMOGLOBIN: 12.3 g/dL — AB (ref 13.0–18.0)
Lymphocytes Relative: 32 %
Lymphs Abs: 2.2 10*3/uL (ref 1.0–3.6)
MCH: 32.7 pg (ref 26.0–34.0)
MCHC: 33.4 g/dL (ref 32.0–36.0)
MCV: 98 fL (ref 80.0–100.0)
Monocytes Absolute: 1 10*3/uL (ref 0.2–1.0)
Monocytes Relative: 14 %
Neutro Abs: 3.6 10*3/uL (ref 1.4–6.5)
Neutrophils Relative %: 53 %
Platelets: 270 10*3/uL (ref 150–440)
RBC: 3.77 MIL/uL — AB (ref 4.40–5.90)
RDW: 13.3 % (ref 11.5–14.5)
WBC: 6.9 10*3/uL (ref 3.8–10.6)

## 2016-06-01 LAB — ETHANOL

## 2016-06-01 LAB — BASIC METABOLIC PANEL
Anion gap: 12 (ref 5–15)
BUN: 12 mg/dL (ref 6–20)
CALCIUM: 9 mg/dL (ref 8.9–10.3)
CO2: 23 mmol/L (ref 22–32)
Chloride: 106 mmol/L (ref 101–111)
Creatinine, Ser: 0.98 mg/dL (ref 0.61–1.24)
GFR calc Af Amer: >60 mL/min (ref >60)
GFR calc non Af Amer: >60 mL/min (ref >60)
GLUCOSE: 180 mg/dL — AB (ref 65–99)
Potassium: 3 mmol/L — ABNORMAL LOW (ref 3.5–5.1)
SODIUM: 141 mmol/L (ref 135–145)

## 2016-06-01 LAB — ACETAMINOPHEN LEVEL

## 2016-06-01 LAB — URINE DRUG SCREEN, QUALITATIVE (ARMC ONLY)
AMPHETAMINES, UR SCREEN: NOT DETECTED
BENZODIAZEPINE, UR SCRN: NOT DETECTED
Barbiturates, Ur Screen: NOT DETECTED
Cannabinoid 50 Ng, Ur ~~LOC~~: NOT DETECTED
Cocaine Metabolite,Ur ~~LOC~~: NOT DETECTED
MDMA (ECSTASY) UR SCREEN: NOT DETECTED
Methadone Scn, Ur: NOT DETECTED
OPIATE, UR SCREEN: NOT DETECTED
PHENCYCLIDINE (PCP) UR S: NOT DETECTED
Tricyclic, Ur Screen: NOT DETECTED

## 2016-06-01 LAB — SALICYLATE LEVEL

## 2016-06-01 LAB — CK: Total CK: 462 U/L — ABNORMAL HIGH (ref 49–397)

## 2016-06-01 MED ORDER — LORAZEPAM 2 MG/ML IJ SOLN
INTRAMUSCULAR | Status: AC
  Administered 2016-06-01: 2 mg via INTRAVENOUS
  Filled 2016-06-01: qty 1

## 2016-06-01 MED ORDER — KETAMINE HCL 10 MG/ML IJ SOLN
INTRAMUSCULAR | Status: AC
  Filled 2016-06-01: qty 1

## 2016-06-01 MED ORDER — KETAMINE HCL 50 MG/ML IJ SOLN
300.0000 mg | Freq: Once | INTRAMUSCULAR | Status: AC
  Administered 2016-06-01: 300 mg via INTRAMUSCULAR
  Filled 2016-06-01: qty 6

## 2016-06-01 MED ORDER — LORAZEPAM 2 MG/ML IJ SOLN
2.0000 mg | Freq: Once | INTRAMUSCULAR | Status: AC
  Administered 2016-06-01: 2 mg via INTRAVENOUS

## 2016-06-01 MED ORDER — SODIUM CHLORIDE 0.9 % IV BOLUS (SEPSIS)
1000.0000 mL | Freq: Once | INTRAVENOUS | Status: AC
  Administered 2016-06-01: 1000 mL via INTRAVENOUS
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
## 2016-06-01 NOTE — ED Notes (Signed)
BEHAVIORAL HEALTH ROUNDING Patient sleeping: Yes.   Patient alert and oriented: not applicable SLEEPING Behavior appropriate: Yes.  ; If no, describe: SLEEPING Nutrition and fluids offered: No SLEEPING Toileting and hygiene offered: NoSLEEPING Sitter present: not applicable, Q 15 min safety rounds and observation. Law enforcement present: Yes ODS 
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
## 2016-06-01 NOTE — ED Notes (Signed)
Pt wheeled into the ED in wheelchair, accompanied by 4 [employer] officers; pt in handcuffs behind his back, wearing a spit-shield; began yelling and cursing as soon as he rolled into the lobby; pt taken back to triage 3;

## 2016-06-01 NOTE — ED Notes (Signed)
ENVIRONMENTAL ASSESSMENT  Potentially harmful objects out of patient reach: Yes.  Personal belongings secured: Yes.  Patient dressed in hospital provided attire only: Yes.  Plastic bags out of patient reach: Yes.  Patient care equipment (cords, cables, call bells, lines, and drains) shortened, removed, or accounted for: Yes.  Equipment and supplies removed from bottom of stretcher: Yes.  Potentially toxic materials out of patient reach: Yes.  Sharps container removed or out of patient reach: Yes.   BEHAVIORAL HEALTH ROUNDING  Patient sleeping: No.  Patient alert and oriented: unable to assess Behavior appropriate: no. ; If no, describe: talking loudly, cursing, remains in [employer] custody with handcuff and spit shield over head.  Nutrition and fluids offered: no Toileting and hygiene offered: no Sitter present: not applicable, Q 15 min safety rounds and observation.  Law enforcement present: Yes [employer] and [employer]

## 2016-06-01 NOTE — Progress Notes (Signed)
TTS unable to assess at this time.  Patient has been sedated.

## 2016-06-02 DIAGNOSIS — F209 Schizophrenia, unspecified: Secondary | ICD-10-CM | POA: Diagnosis not present

## 2016-06-02 DIAGNOSIS — F203 Undifferentiated schizophrenia: Secondary | ICD-10-CM | POA: Diagnosis not present

## 2016-06-02 LAB — GLUCOSE, CAPILLARY: Glucose-Capillary: 80 mg/dL (ref 65–99)

## 2016-06-02 MED ORDER — QUETIAPINE FUMARATE 300 MG PO TABS: 300.0000 mg | ORAL_TABLET | Freq: Every day | ORAL | Status: DC

## 2016-06-02 MED ORDER — ACETAMINOPHEN 325 MG PO TABS: 650.0000 mg | ORAL_TABLET | Freq: Three times a day (TID) | ORAL | Status: DC | PRN

## 2016-06-02 MED ORDER — RISPERIDONE 1 MG PO TABS
3.0000 mg | ORAL_TABLET | Freq: Every day | ORAL | Status: DC
Start: 2016-06-02 — End: 2016-06-02

## 2016-06-02 MED ORDER — LITHIUM CARBONATE ER 300 MG PO TBCR
300.0000 mg | EXTENDED_RELEASE_TABLET | Freq: Two times a day (BID) | ORAL | Status: DC
  Filled 2016-06-02: qty 1

## 2016-06-02 MED ORDER — LORAZEPAM 2 MG PO TABS
2.0000 mg | ORAL_TABLET | Freq: Once | ORAL | Status: AC
  Administered 2016-06-02: 2 mg via ORAL

## 2016-06-02 MED ORDER — LORAZEPAM 2 MG PO TABS
ORAL_TABLET | ORAL | Status: AC
  Administered 2016-06-02: 2 mg via ORAL
  Filled 2016-06-02: qty 1

## 2016-06-02 MED ORDER — RISPERIDONE 1 MG PO TABS
2.0000 mg | ORAL_TABLET | Freq: Every day | ORAL | Status: DC
  Administered 2016-06-02: 2 mg via ORAL
  Filled 2016-06-02: qty 2

## 2016-06-02 MED ORDER — RISPERIDONE 1 MG PO TABS: 2.0000 mg | ORAL_TABLET | Freq: Two times a day (BID) | ORAL | Status: DC

## 2016-06-02 MED ORDER — ZIPRASIDONE HCL 20 MG PO CAPS
20.0000 mg | ORAL_CAPSULE | Freq: Once | ORAL | Status: AC
  Administered 2016-06-02: 20 mg via ORAL
  Filled 2016-06-02: qty 1

## 2016-06-02 MED ORDER — DIVALPROEX SODIUM 250 MG PO DR TAB
250.0000 mg | DELAYED_RELEASE_TABLET | Freq: Two times a day (BID) | ORAL | Status: DC
  Filled 2016-06-02: qty 3

## 2016-06-02 NOTE — ED Notes (Signed)
IVC has been rescinded. Await group home to pick up patient. Patient anxious to go home.

## 2016-06-02 NOTE — ED Notes (Signed)
BEHAVIORAL HEALTH ROUNDING Patient sleeping: Yes.   Patient alert and oriented: not applicable SLEEPING Behavior appropriate: Yes.  ; If no, describe: SLEEPING Nutrition and fluids offered: No SLEEPING Toileting and hygiene offered: NoSLEEPING Sitter present: not applicable, Q 15 min safety rounds and observation. Law enforcement present: Yes ODS 

## 2016-06-02 NOTE — ED Notes (Signed)

## 2016-06-02 NOTE — ED Notes (Signed)
[facility] and computer removed from room.

## 2016-06-02 NOTE — ED Notes (Signed)
BEHAVIORAL HEALTH ROUNDING Patient sleeping: No. Patient alert and oriented: yes Behavior appropriate: Yes.  ; If no, describe:  Nutrition and fluids offered: Yes  Toileting and hygiene offered: Yes  Sitter present: not applicable Law enforcement present: Yes  

## 2016-06-02 NOTE — BH Assessment (Signed)
Writer spoke with Group Home ([phone]) and informed her the patient was going to be discharged. She stated she will be able to pick him up "around 6:30pm."  Writer updated patient's nurse Selena Batten(Kim).

## 2016-06-02 NOTE — ED Provider Notes (Signed)
Dr. Toni Amendlapacs has cleared the patient for discharge and taken him off IVC   Jene EveryKinner, Gabreille Dardis, MD 06/02/16 1651

## 2016-06-02 NOTE — ED Notes (Signed)
Pt brought back to rm 23 from triage with 3 [employer] officers, 1 [employer] sheriff and 1 [employer] officer with pt.

## 2016-06-02 NOTE — ED Notes (Signed)
BEHAVIORAL HEALTH ROUNDING Patient sleeping: No. Patient alert and oriented: no Behavior appropriate: No.; If no, describe:  Nutrition and fluids offered: Yes  Toileting and hygiene offered: Yes  Sitter present: not applicable Law enforcement present: Yes  

## 2016-06-02 NOTE — ED Notes (Signed)
BEHAVIORAL HEALTH ROUNDING Patient sleeping: Yes.   Patient alert and oriented: no Behavior appropriate: Yes.  ; If no, describe:  Nutrition and fluids offered: Yes  Toileting and hygiene offered: Yes  Sitter present: not applicable Law enforcement present: Yes  

## 2016-06-02 NOTE — ED Notes (Addendum)
Pt removed his monitoring equipment and took his IV out. Dr Zenda AlpersWebster informed.

## 2016-06-02 NOTE — ED Notes (Signed)
[facility] Computer placed in room with pt and this RN stayed with pt until MD came on computer.

## 2016-06-02 NOTE — ED Notes (Signed)
BEHAVIORAL HEALTH ROUNDING  Patient sleeping: No.  Patient alert and oriented: yes  Behavior appropriate: no ; If no, describe: pt becoming loud and verbally aggressive but is easily redirected.  Nutrition and fluids offered: Yes  Toileting and hygiene offered: Yes  Sitter present: not applicable, Q 15 min safety rounds and observation.  Law enforcement present: Yes [employer]

## 2016-06-02 NOTE — ED Notes (Signed)
BEHAVIORAL HEALTH ROUNDING Patient sleeping: No. Patient alert and oriented: no Behavior appropriate: Yes.  ; If no, describe:  Nutrition and fluids offered: Yes  Toileting and hygiene offered: Yes  Sitter present: not applicable Law enforcement present: Yes  

## 2016-06-02 NOTE — ED Notes (Signed)
Patient refuses depakote and lithium at this time. States is he takes them he cant breathe. Patient is not calm and cooperative. Will hold meds at this time.

## 2016-06-02 NOTE — ED Notes (Signed)
Pt noted to have heart rate in the upper 40's at this time. Dr Zenda AlpersWebster informed. Pt arouses to voice and is calm then goes back to sleep. Will continue to monitor.
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
## 2016-06-02 NOTE — ED Notes (Signed)
Gave patient a diet coke. 

## 2016-06-02 NOTE — ED Notes (Signed)
Gave food tray. 
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
## 2016-06-02 NOTE — ED Notes (Signed)
Pt awake asking to use the bathroom. Urinal given. Pt calm at this time.

## 2016-06-02 NOTE — ED Notes (Signed)
BEHAVIORAL HEALTH ROUNDING Patient sleeping: Yes.   Patient alert and oriented: not applicable Behavior appropriate: Yes.  ; If no, describe:  Nutrition and fluids offered: No Toileting and hygiene offered: No Sitter present: not applicable Law enforcement present: Yes  

## 2016-06-02 NOTE — ED Notes (Signed)
Resting on bed with eyes closed, resp unlabored.

## 2016-06-11 ENCOUNTER — Emergency Department
Admission: EM | Admit: 2016-06-11 | Discharge: 2016-06-11 | Disposition: A | Discharge status: Home / Self Care | Payer: Medicare Other | Attending: Student in an Organized Health Care Education/Training Program | Admitting: Student in an Organized Health Care Education/Training Program

## 2016-06-11 DIAGNOSIS — F17200 Nicotine dependence, unspecified, uncomplicated: Secondary | ICD-10-CM | POA: Diagnosis present

## 2016-06-11 DIAGNOSIS — F203 Undifferentiated schizophrenia: Secondary | ICD-10-CM

## 2016-06-11 DIAGNOSIS — Z91199 Patient's noncompliance with other medical treatment and regimen due to unspecified reason: Secondary | ICD-10-CM

## 2016-06-11 DIAGNOSIS — R451 Restlessness and agitation: Secondary | ICD-10-CM | POA: Diagnosis not present

## 2016-06-11 DIAGNOSIS — F17210 Nicotine dependence, cigarettes, uncomplicated: Secondary | ICD-10-CM | POA: Diagnosis not present

## 2016-06-11 DIAGNOSIS — Z9119 Patient's noncompliance with other medical treatment and regimen: Secondary | ICD-10-CM

## 2016-06-11 LAB — COMPREHENSIVE METABOLIC PANEL
ALBUMIN: 4.5 g/dL (ref 3.5–5.0)
ALT: 15 U/L — ABNORMAL LOW (ref 17–63)
AST: 37 U/L (ref 15–41)
Alkaline Phosphatase: 72 U/L (ref 38–126)
Anion gap: 11 (ref 5–15)
BILIRUBIN TOTAL: 0.8 mg/dL (ref 0.3–1.2)
BUN: 18 mg/dL (ref 6–20)
CO2: 22 mmol/L (ref 22–32)
CREATININE: 1.15 mg/dL (ref 0.61–1.24)
Calcium: 9.5 mg/dL (ref 8.9–10.3)
Chloride: 109 mmol/L (ref 101–111)
GFR calc Af Amer: >60 mL/min (ref >60)
GLUCOSE: 172 mg/dL — AB (ref 65–99)
POTASSIUM: 3.6 mmol/L (ref 3.5–5.1)
SODIUM: 142 mmol/L (ref 135–145)
TOTAL PROTEIN: 8.3 g/dL — AB (ref 6.5–8.1)

## 2016-06-11 LAB — CBC
HEMATOCRIT: 38.8 % — AB (ref 40.0–52.0)
Hemoglobin: 12.8 g/dL — ABNORMAL LOW (ref 13.0–18.0)
MCH: 32.8 pg (ref 26.0–34.0)
MCHC: 33.1 g/dL (ref 32.0–36.0)
MCV: 99.2 fL (ref 80.0–100.0)
PLATELETS: 283 10*3/uL (ref 150–440)
RBC: 3.91 MIL/uL — AB (ref 4.40–5.90)
RDW: 13.7 % (ref 11.5–14.5)
WBC: 6.5 10*3/uL (ref 3.8–10.6)

## 2016-06-11 LAB — SALICYLATE LEVEL: Salicylate Lvl: <7 mg/dL (ref 2.8–30.0)

## 2016-06-11 LAB — ACETAMINOPHEN LEVEL: Acetaminophen (Tylenol), Serum: <10 ug/mL — ABNORMAL LOW (ref 10–30)

## 2016-06-11 LAB — ETHANOL: Alcohol, Ethyl (B): <5 mg/dL (ref <5)

## 2016-06-11 LAB — GLUCOSE, CAPILLARY: GLUCOSE-CAPILLARY: 118 mg/dL — AB (ref 65–99)

## 2016-06-11 MED ORDER — INSULIN ASPART 100 UNIT/ML ~~LOC~~ SOLN: 2.0000 [IU] | Freq: Three times a day (TID) | SUBCUTANEOUS | Status: DC

## 2016-06-11 MED ORDER — RISPERIDONE 1 MG PO TABS: 3.0000 mg | ORAL_TABLET | Freq: Every day | ORAL | Status: DC

## 2016-06-11 MED ORDER — INSULIN ASPART 100 UNIT/ML ~~LOC~~ SOLN: 0.0000 [IU] | Freq: Three times a day (TID) | SUBCUTANEOUS | Status: DC

## 2016-06-11 MED ORDER — ZIPRASIDONE MESYLATE 20 MG IM SOLR
20.0000 mg | Freq: Once | INTRAMUSCULAR | Status: AC
  Administered 2016-06-11: 20 mg via INTRAMUSCULAR
  Filled 2016-06-11: qty 20

## 2016-06-11 MED ORDER — DIPHENHYDRAMINE HCL 25 MG PO CAPS: 50.0000 mg | ORAL_CAPSULE | Freq: Every day | ORAL | Status: DC

## 2016-06-11 MED ORDER — RISPERIDONE 1 MG PO TABS
1.0000 mg | ORAL_TABLET | Freq: Two times a day (BID) | ORAL | Status: DC
  Administered 2016-06-11: 1 mg via ORAL
  Filled 2016-06-11: qty 1

## 2016-06-11 MED ORDER — AMLODIPINE BESYLATE 5 MG PO TABS
5.0000 mg | ORAL_TABLET | Freq: Every day | ORAL | Status: DC
  Administered 2016-06-11: 5 mg via ORAL
  Filled 2016-06-11: qty 1

## 2016-06-11 NOTE — ED Notes (Signed)
BEHAVIORAL HEALTH ROUNDING Patient sleeping: No. Patient alert and oriented: yes Behavior appropriate: Yes - loud and demanding - yelling in the hallway.  ; If no, describe:  Nutrition and fluids offered: yes Toileting and hygiene offered: Yes  Sitter present: q15 minute observations and security monitoring Law enforcement present: Yes  ODS

## 2016-06-11 NOTE — ED Notes (Signed)
[address] [place]

## 2016-06-11 NOTE — ED Notes (Signed)
Gave food tray. 
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
## 2016-06-11 NOTE — Discharge Instructions (Signed)
Take your medications as directed.  Return for any thoughts of harming your self or others.

## 2016-06-11 NOTE — ED Notes (Signed)
BEHAVIORAL HEALTH ROUNDING Patient sleeping: Yes.   Patient alert and oriented: eyes closed  Appears to be asleep Behavior appropriate: Yes.  ; If no, describe:  Nutrition and fluids offered: Yes  Toileting and hygiene offered: sleeping Sitter present: q 15 minute observations and security monitoring Law enforcement present: yes  ODS 
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
## 2016-06-11 NOTE — ED Notes (Signed)

## 2016-06-11 NOTE — ED Notes (Signed)
Gave patient ice cream 

## 2016-06-11 NOTE — ED Triage Notes (Signed)
He arrives today from his group home - aggressive with [employer] officers x4  Pt alert upon arrival - he is reporting to me that Sallee LangeJada Pinkett Katrinka BlazingSmith is his mother  IVC papers state that he has not been taking his meds - he has not been sleeping and he has become aggressive with staff at care home

## 2016-06-11 NOTE — BH Assessment (Signed)
Writer called and spoke with [facility] ([name])to informed them the patient will be discharge and to get the phone number for the Group Home.  Spoke with Care Home ([phone]) and was instructed to call the owner Jimmye NormanLawanda Ray.  Per Owner Rowan Blase(Lawanda [phone]), they have a contract with [facility] and was instructed to call them and have him transported back to their facility. Writer updated patient's nurse Mary Sella(Matthew M).

## 2016-06-13 ENCOUNTER — Emergency Department
Admission: EM | Admit: 2016-06-13 | Discharge: 2016-06-14 | Disposition: A | Discharge status: Other Acute Inpatient Hospital | Payer: Medicare Other | Attending: Emergency Medicine | Admitting: Emergency Medicine

## 2016-06-13 ENCOUNTER — Other Ambulatory Visit: Payer: Self-pay | Admitting: Psychiatry

## 2016-06-13 ENCOUNTER — Encounter: Payer: Self-pay | Admitting: Emergency Medicine

## 2016-06-13 DIAGNOSIS — Z91199 Patient's noncompliance with other medical treatment and regimen due to unspecified reason: Secondary | ICD-10-CM

## 2016-06-13 DIAGNOSIS — F203 Undifferentiated schizophrenia: Secondary | ICD-10-CM | POA: Diagnosis not present

## 2016-06-13 DIAGNOSIS — F17200 Nicotine dependence, unspecified, uncomplicated: Secondary | ICD-10-CM | POA: Diagnosis present

## 2016-06-13 DIAGNOSIS — F29 Unspecified psychosis not due to a substance or known physiological condition: Secondary | ICD-10-CM | POA: Diagnosis not present

## 2016-06-13 DIAGNOSIS — M6282 Rhabdomyolysis: Secondary | ICD-10-CM | POA: Diagnosis not present

## 2016-06-13 DIAGNOSIS — F209 Schizophrenia, unspecified: Secondary | ICD-10-CM | POA: Diagnosis not present

## 2016-06-13 DIAGNOSIS — F17210 Nicotine dependence, cigarettes, uncomplicated: Secondary | ICD-10-CM | POA: Insufficient documentation

## 2016-06-13 DIAGNOSIS — Z9119 Patient's noncompliance with other medical treatment and regimen: Secondary | ICD-10-CM

## 2016-06-13 DIAGNOSIS — R4182 Altered mental status, unspecified: Secondary | ICD-10-CM | POA: Diagnosis present

## 2016-06-13 DIAGNOSIS — T796XXA Traumatic ischemia of muscle, initial encounter: Secondary | ICD-10-CM

## 2016-06-13 DIAGNOSIS — I10 Essential (primary) hypertension: Secondary | ICD-10-CM

## 2016-06-13 LAB — COMPREHENSIVE METABOLIC PANEL
ALT: 22 U/L (ref 17–63)
AST: 66 U/L — AB (ref 15–41)
Albumin: 4.4 g/dL (ref 3.5–5.0)
Alkaline Phosphatase: 68 U/L (ref 38–126)
Anion gap: 16 — ABNORMAL HIGH (ref 5–15)
BUN: 17 mg/dL (ref 6–20)
CHLORIDE: 110 mmol/L (ref 101–111)
CO2: 16 mmol/L — AB (ref 22–32)
CREATININE: 1.26 mg/dL — AB (ref 0.61–1.24)
Calcium: 9.3 mg/dL (ref 8.9–10.3)
Glucose, Bld: 126 mg/dL — ABNORMAL HIGH (ref 65–99)
Potassium: 3.8 mmol/L (ref 3.5–5.1)
Sodium: 142 mmol/L (ref 135–145)
Total Bilirubin: 1 mg/dL (ref 0.3–1.2)
Total Protein: 7.9 g/dL (ref 6.5–8.1)

## 2016-06-13 LAB — URINE DRUG SCREEN, QUALITATIVE (ARMC ONLY)
AMPHETAMINES, UR SCREEN: NOT DETECTED
Barbiturates, Ur Screen: NOT DETECTED
Benzodiazepine, Ur Scrn: POSITIVE — AB
CANNABINOID 50 NG, UR ~~LOC~~: NOT DETECTED
COCAINE METABOLITE, UR ~~LOC~~: NOT DETECTED
MDMA (ECSTASY) UR SCREEN: NOT DETECTED
Methadone Scn, Ur: NOT DETECTED
OPIATE, UR SCREEN: NOT DETECTED
PHENCYCLIDINE (PCP) UR S: NOT DETECTED
Tricyclic, Ur Screen: NOT DETECTED

## 2016-06-13 LAB — URINALYSIS, COMPLETE (UACMP) WITH MICROSCOPIC
BILIRUBIN URINE: NEGATIVE
GLUCOSE, UA: NEGATIVE mg/dL
HGB URINE DIPSTICK: NEGATIVE
KETONES UR: 5 mg/dL — AB
Nitrite: NEGATIVE
PH: 5 (ref 5.0–8.0)
Protein, ur: 30 mg/dL — AB
SPECIFIC GRAVITY, URINE: 1.032 — AB (ref 1.005–1.030)

## 2016-06-13 LAB — CBC WITH DIFFERENTIAL/PLATELET
BASOS ABS: 0.1 10*3/uL (ref 0–0.1)
Basophils Relative: 1 %
EOS ABS: 0.1 10*3/uL (ref 0–0.7)
EOS PCT: 2 %
HCT: 37.7 % — ABNORMAL LOW (ref 40.0–52.0)
Hemoglobin: 12.4 g/dL — ABNORMAL LOW (ref 13.0–18.0)
Lymphocytes Relative: 31 %
Lymphs Abs: 2 10*3/uL (ref 1.0–3.6)
MCH: 32.2 pg (ref 26.0–34.0)
MCHC: 32.9 g/dL (ref 32.0–36.0)
MCV: 97.8 fL (ref 80.0–100.0)
Monocytes Absolute: 0.9 10*3/uL (ref 0.2–1.0)
Monocytes Relative: 14 %
Neutro Abs: 3.3 10*3/uL (ref 1.4–6.5)
Neutrophils Relative %: 52 %
PLATELETS: 282 10*3/uL (ref 150–440)
RBC: 3.86 MIL/uL — AB (ref 4.40–5.90)
RDW: 13.8 % (ref 11.5–14.5)
WBC: 6.3 10*3/uL (ref 3.8–10.6)

## 2016-06-13 LAB — TROPONIN I

## 2016-06-13 LAB — CK
Total CK: 2034 U/L — ABNORMAL HIGH (ref 49–397)
Total CK: 1955 U/L — ABNORMAL HIGH (ref 49–397)

## 2016-06-13 LAB — GLUCOSE, CAPILLARY
Glucose-Capillary: 114 mg/dL — ABNORMAL HIGH (ref 65–99)
Glucose-Capillary: 102 mg/dL — ABNORMAL HIGH (ref 65–99)

## 2016-06-13 LAB — ETHANOL: Alcohol, Ethyl (B): <5 mg/dL (ref <5)

## 2016-06-13 MED ORDER — HALOPERIDOL LACTATE 5 MG/ML IJ SOLN
5.0000 mg | Freq: Once | INTRAMUSCULAR | Status: AC
  Administered 2016-06-13: 5 mg via INTRAVENOUS

## 2016-06-13 MED ORDER — RISPERIDONE 0.5 MG PO TBDP
2.0000 mg | ORAL_TABLET | Freq: Two times a day (BID) | ORAL | Status: DC
  Administered 2016-06-14 (×2): 2 mg via ORAL
  Filled 2016-06-13 (×2): qty 4

## 2016-06-13 MED ORDER — HALOPERIDOL LACTATE 5 MG/ML IJ SOLN
INTRAMUSCULAR | Status: AC
  Filled 2016-06-13: qty 1

## 2016-06-13 MED ORDER — LORAZEPAM 2 MG/ML IJ SOLN
INTRAMUSCULAR | Status: AC
  Administered 2016-06-13: 2 mg via INTRAMUSCULAR
  Filled 2016-06-13: qty 1

## 2016-06-13 MED ORDER — INSULIN ASPART 100 UNIT/ML ~~LOC~~ SOLN: 0.0000 [IU] | Freq: Three times a day (TID) | SUBCUTANEOUS | Status: DC

## 2016-06-13 MED ORDER — LORAZEPAM 2 MG/ML IJ SOLN
2.0000 mg | Freq: Once | INTRAMUSCULAR | Status: AC
  Administered 2016-06-13: 2 mg via INTRAMUSCULAR

## 2016-06-13 MED ORDER — RISPERIDONE MICROSPHERES 50 MG IM SUSR
50.0000 mg | Freq: Once | INTRAMUSCULAR | Status: DC
  Filled 2016-06-13: qty 2

## 2016-06-13 MED ORDER — AMLODIPINE BESYLATE 5 MG PO TABS
5.0000 mg | ORAL_TABLET | Freq: Every day | ORAL | Status: DC
Start: 2016-06-13 — End: 2016-06-15
  Administered 2016-06-14: 5 mg via ORAL
  Filled 2016-06-13: qty 1

## 2016-06-13 MED ORDER — DIPHENHYDRAMINE HCL 50 MG/ML IJ SOLN
50.0000 mg | Freq: Once | INTRAMUSCULAR | Status: AC
  Administered 2016-06-13: 50 mg via INTRAMUSCULAR

## 2016-06-13 MED ORDER — HALOPERIDOL LACTATE 5 MG/ML IJ SOLN
10.0000 mg | Freq: Once | INTRAMUSCULAR | Status: AC
  Administered 2016-06-13: 10 mg via INTRAMUSCULAR

## 2016-06-13 MED ORDER — SODIUM CHLORIDE 0.9 % IV SOLN
Freq: Once | INTRAVENOUS | Status: AC
  Administered 2016-06-13: via INTRAVENOUS

## 2016-06-13 MED ORDER — SODIUM CHLORIDE 0.9 % IV SOLN
Freq: Once | INTRAVENOUS | Status: AC
  Administered 2016-06-13: 20:00:00 via INTRAVENOUS

## 2016-06-13 MED ORDER — HALOPERIDOL LACTATE 5 MG/ML IJ SOLN
INTRAMUSCULAR | Status: AC
Start: 2016-06-13 — End: 2016-06-13
  Administered 2016-06-13: 10 mg via INTRAMUSCULAR
  Filled 2016-06-13: qty 2

## 2016-06-13 MED ORDER — DIPHENHYDRAMINE HCL 50 MG/ML IJ SOLN
INTRAMUSCULAR | Status: AC
  Administered 2016-06-13: 50 mg via INTRAMUSCULAR
  Filled 2016-06-13: qty 1
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
## 2016-06-13 NOTE — ED Notes (Signed)
PT IVC PENDING PSYCH CONSULT. 

## 2016-06-13 NOTE — ED Notes (Signed)
PT IVC/ PENDING PLACEMENT  

## 2016-06-13 NOTE — ED Triage Notes (Signed)
Pt brought in by [employer] under IVC in handcuffs for aggressive and psychotic behavior.
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
## 2016-06-13 NOTE — ED Notes (Signed)
Pt to room ed21 via [employer] officers in handcuffs. Pt with disorganized thoughts, rapid loud speech, making sexual comments and threatening comments to male officer and male staff. Dr Mayford Knifewilliams to bedside. Pt ordered and received im injections. Handcuffs removed. Pt given food box and drink. Over 20 minutes pt became calmer and at this time pt is sleeping, will arouse with mild stimulation.

## 2016-06-13 NOTE — BH Assessment (Signed)
Per Dr.William, pt received IM medications and is unable to participate in TTS consult. TTS to consult at later time.
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
## 2016-06-14 DIAGNOSIS — F29 Unspecified psychosis not due to a substance or known physiological condition: Secondary | ICD-10-CM | POA: Diagnosis not present

## 2016-06-14 LAB — GLUCOSE, CAPILLARY
GLUCOSE-CAPILLARY: 84 mg/dL (ref 65–99)
GLUCOSE-CAPILLARY: 88 mg/dL (ref 65–99)
GLUCOSE-CAPILLARY: 99 mg/dL (ref 65–99)
Glucose-Capillary: 119 mg/dL — ABNORMAL HIGH (ref 65–99)

## 2016-06-14 LAB — BASIC METABOLIC PANEL
ANION GAP: 5 (ref 5–15)
Anion gap: 9 (ref 5–15)
BUN: 14 mg/dL (ref 6–20)
BUN: 10 mg/dL (ref 6–20)
CALCIUM: 8 mg/dL — AB (ref 8.9–10.3)
CALCIUM: 8.6 mg/dL — AB (ref 8.9–10.3)
CHLORIDE: 108 mmol/L (ref 101–111)
CO2: 23 mmol/L (ref 22–32)
CO2: 22 mmol/L (ref 22–32)
CREATININE: 0.87 mg/dL (ref 0.61–1.24)
Chloride: 113 mmol/L — ABNORMAL HIGH (ref 101–111)
Creatinine, Ser: 0.87 mg/dL (ref 0.61–1.24)
GFR calc non Af Amer: >60 mL/min (ref >60)
Glucose, Bld: 115 mg/dL — ABNORMAL HIGH (ref 65–99)
Glucose, Bld: 104 mg/dL — ABNORMAL HIGH (ref 65–99)
Potassium: 3.8 mmol/L (ref 3.5–5.1)
Potassium: 4 mmol/L (ref 3.5–5.1)
SODIUM: 139 mmol/L (ref 135–145)
Sodium: 141 mmol/L (ref 135–145)

## 2016-06-14 LAB — CK
Total CK: 2008 U/L — ABNORMAL HIGH (ref 49–397)
Total CK: 2680 U/L — ABNORMAL HIGH (ref 49–397)
Total CK: 2548 U/L — ABNORMAL HIGH (ref 49–397)
Total CK: 2166 U/L — ABNORMAL HIGH (ref 49–397)

## 2016-06-14 LAB — HEMOGLOBIN A1C
Hgb A1c MFr Bld: 4.6 % — ABNORMAL LOW (ref 4.8–5.6)
MEAN PLASMA GLUCOSE: 85 mg/dL

## 2016-06-14 MED ORDER — SODIUM CHLORIDE 0.9 % IV BOLUS (SEPSIS)
2000.0000 mL | Freq: Once | INTRAVENOUS | Status: AC
  Administered 2016-06-14: 2000 mL via INTRAVENOUS

## 2016-06-14 MED ORDER — LORAZEPAM 2 MG/ML IJ SOLN
1.0000 mg | Freq: Once | INTRAMUSCULAR | Status: AC
  Administered 2016-06-14: 1 mg via INTRAVENOUS

## 2016-06-14 MED ORDER — LORAZEPAM 2 MG/ML IJ SOLN
1.0000 mg | Freq: Once | INTRAMUSCULAR | Status: DC
  Filled 2016-06-14: qty 1

## 2016-06-14 MED ORDER — BENZTROPINE MESYLATE 1 MG/ML IJ SOLN
0.5000 mg | Freq: Once | INTRAMUSCULAR | Status: AC
  Administered 2016-06-14: 0.5 mg via INTRAMUSCULAR
  Filled 2016-06-14: qty 0.5

## 2016-06-14 MED ORDER — ZIPRASIDONE MESYLATE 20 MG IM SOLR
10.0000 mg | Freq: Once | INTRAMUSCULAR | Status: AC
  Administered 2016-06-14: 10 mg via INTRAMUSCULAR

## 2016-06-14 MED ORDER — ZIPRASIDONE MESYLATE 20 MG IM SOLR
INTRAMUSCULAR | Status: AC
  Filled 2016-06-14: qty 20

## 2016-06-14 MED ORDER — HALOPERIDOL LACTATE 5 MG/ML IJ SOLN: 5.0000 mg | Freq: Once | INTRAMUSCULAR | Status: DC

## 2016-06-14 MED ORDER — ZIPRASIDONE MESYLATE 20 MG IM SOLR: 10.0000 mg | Freq: Once | INTRAMUSCULAR | Status: DC

## 2016-06-14 NOTE — ED Notes (Signed)
BEHAVIORAL HEALTH ROUNDING Patient sleeping: Yes.   Patient alert and oriented: not applicable SLEEPING Behavior appropriate: Yes.  ; If no, describe: SLEEPING Nutrition and fluids offered: No SLEEPING Toileting and hygiene offered: NoSLEEPING Sitter present: not applicable, Q 15 min safety rounds and observation. Law enforcement present: Yes ODS 

## 2016-06-14 NOTE — ED Notes (Signed)
Report off to ann rn 

## 2016-06-14 NOTE — ED Notes (Signed)
Pt reports his name is not [name], it is Dustin Third BancorpShawn [name].

## 2016-06-14 NOTE — ED Notes (Signed)
RN to patient room. Pt had disconnected IV fluids and was attempting to place the tubing in the sharps box. When asked why he was attempting said action patient stated: "I am not a woman, you can take my blood. They take my blood. They see my DNA. I don't need this. This is woman's medication." This RN attempted to reorient patient and explain the purpose of the IV fluids. Pt became increasingly agitated. Pt refused IV fluids. Pt appeared to have hallucinations that blood was being drawn. MD Quale informed.

## 2016-06-14 NOTE — ED Notes (Signed)
Pt talking loudly, hitting the wall. Easily redirected but quickly becomes loud again. Dr Derrill KayGoodman informed and new order received.

## 2016-06-14 NOTE — ED Notes (Signed)
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 Pt intermittent verbally aggressive, able to redirect.

## 2016-06-14 NOTE — ED Notes (Signed)
Called pharmacy to request cogentin

## 2016-06-14 NOTE — ED Notes (Signed)
ENVIRONMENTAL ASSESSMENT  Potentially harmful objects out of patient reach: Yes.  Personal belongings secured: Yes.  Patient dressed in hospital provided attire only: Yes.  Plastic bags out of patient reach: Yes.  Patient care equipment (cords, cables, call bells, lines, and drains) shortened, removed, or accounted for: Yes.  Equipment and supplies removed from bottom of stretcher: Yes.  Potentially toxic materials out of patient reach: Yes.  Sharps container removed or out of patient reach: Yes.   BEHAVIORAL HEALTH ROUNDING  Patient sleeping: No.  Patient alert and oriented: yes  Behavior appropriate: No. ; If no, describe: loud hitting the wall.  Nutrition and fluids offered: Yes  Toileting and hygiene offered: Yes  Sitter present: not applicable, Q 15 min safety rounds and observation.  Law enforcement present: Yes ODS

## 2016-06-14 NOTE — ED Notes (Signed)
Gave patient a meal tray. 

## 2016-06-14 NOTE — Progress Notes (Signed)
Plan is for [facility] per Dr. Toni Amendlapacs. [place]. APS workers are [employer]andi Owenby [phone] and Dalene SeltzerBrittany Melvin [phone]. Per Donnal Debarandi APS worker patient's ACT [facility] through [facility] [place] Lee [phone] initiated the IVC for patient in the community. Per Randi patient is currently homeless and got kicked out of one of [place] [facility] group homes. Randi reported that APS is working on new placement and will follow up with the BMU Clinical Child psychotherapistocial Worker (CSW).  B[employer] Hughes Inc[employer]Bailey Elberta Lachapelle, LCSW [phone]

## 2016-06-14 NOTE — ED Provider Notes (Signed)
-----------------------------------------   1:44 AM on 06/14/2016 -----------------------------------------   Blood pressure (!) 114/57, pulse 61, temperature 97.5 F (36.4 C), temperature source Oral, resp. rate 17, height 5\' 8"  (1.727 m), weight 68 kg (150 lb), SpO2 99 %.  The patient has unhooked his IV fluids, is becoming somewhat paranoid now reporting that he believes we're giving him medicine that may "turning into a woman. He has been given Haldol previous, there is a question of allergy listed on his chart. At this point, given his ongoing agitation, and previously receiving Haldol but only getting about 2-3 hours of calling a fact we will trial 10 mg dose of Geodon for ongoing paranoia and behavioral disturbance.       Sharyn CreamerQuale, Mark, MD 06/14/16 [phone]

## 2016-06-14 NOTE — ED Notes (Signed)
Pt ambulated to bathroom.  Given a gingerale as per pts request.

## 2016-06-14 NOTE — ED Notes (Signed)
Pt given sandwich tray and a cup of decaf coffe as per MD's request.

## 2016-06-14 NOTE — ED Notes (Signed)
Eating dinner tray  °

## 2016-06-14 NOTE — ED Provider Notes (Signed)
Patient creatinine normalized, however CK remains elevated. Patient is taking by mouth well.  At this point, appears to be improving. The patient is awake alert and ambulatory asking questions about plan for admission to psychiatry and when Dr. Toni Amendlapacs will be reevaluating him.  Plan to recheck CK and metabolic panel at about 10 AM, if continuing to show improvement we will anticipate admission to psychiatry as planned. Urine culture added.   Dustin CreamerQuale, Dustin Colt, MD 06/14/16 [phone]

## 2016-06-14 NOTE — ED Notes (Signed)
Iv fluids infused.  Labs sent.  Pt calm and cooperative.

## 2016-06-15 ENCOUNTER — Inpatient Hospital Stay
Admission: AD | Admit: 2016-06-15 | Discharge: 2016-06-26 | DRG: 885 | Disposition: A | Discharge status: Home / Self Care | Payer: Medicare Other | Source: Intra-hospital | Attending: Psychiatry | Admitting: Psychiatry

## 2016-06-15 DIAGNOSIS — G4700 Insomnia, unspecified: Secondary | ICD-10-CM | POA: Diagnosis present

## 2016-06-15 DIAGNOSIS — Z794 Long term (current) use of insulin: Secondary | ICD-10-CM | POA: Diagnosis not present

## 2016-06-15 DIAGNOSIS — F250 Schizoaffective disorder, bipolar type: Secondary | ICD-10-CM | POA: Diagnosis present

## 2016-06-15 DIAGNOSIS — F17200 Nicotine dependence, unspecified, uncomplicated: Secondary | ICD-10-CM | POA: Diagnosis not present

## 2016-06-15 DIAGNOSIS — F17210 Nicotine dependence, cigarettes, uncomplicated: Secondary | ICD-10-CM | POA: Diagnosis present

## 2016-06-15 DIAGNOSIS — I10 Essential (primary) hypertension: Secondary | ICD-10-CM | POA: Diagnosis present

## 2016-06-15 DIAGNOSIS — Z9119 Patient's noncompliance with other medical treatment and regimen: Secondary | ICD-10-CM

## 2016-06-15 DIAGNOSIS — R7303 Prediabetes: Secondary | ICD-10-CM | POA: Diagnosis present

## 2016-06-15 DIAGNOSIS — Z888 Allergy status to other drugs, medicaments and biological substances status: Secondary | ICD-10-CM | POA: Diagnosis not present

## 2016-06-15 DIAGNOSIS — Z91199 Patient's noncompliance with other medical treatment and regimen due to unspecified reason: Secondary | ICD-10-CM

## 2016-06-15 LAB — URINALYSIS, COMPLETE (UACMP) WITH MICROSCOPIC
Bacteria, UA: NONE SEEN
Bilirubin Urine: NEGATIVE
GLUCOSE, UA: NEGATIVE mg/dL
Hgb urine dipstick: NEGATIVE
Ketones, ur: NEGATIVE mg/dL
Leukocytes, UA: NEGATIVE
Nitrite: NEGATIVE
PH: 7 (ref 5.0–8.0)
Protein, ur: NEGATIVE mg/dL
RBC / HPF: NONE SEEN RBC/hpf (ref 0–5)
SPECIFIC GRAVITY, URINE: 1.006 (ref 1.005–1.030)

## 2016-06-15 LAB — URINE DRUG SCREEN, QUALITATIVE (ARMC ONLY)
AMPHETAMINES, UR SCREEN: NOT DETECTED
Barbiturates, Ur Screen: NOT DETECTED
Benzodiazepine, Ur Scrn: NOT DETECTED
COCAINE METABOLITE, UR ~~LOC~~: NOT DETECTED
Cannabinoid 50 Ng, Ur ~~LOC~~: NOT DETECTED
MDMA (ECSTASY) UR SCREEN: NOT DETECTED
Methadone Scn, Ur: NOT DETECTED
OPIATE, UR SCREEN: NOT DETECTED
PHENCYCLIDINE (PCP) UR S: NOT DETECTED
Tricyclic, Ur Screen: NOT DETECTED

## 2016-06-15 LAB — TSH: TSH: 0.738 u[IU]/mL (ref 0.350–4.500)

## 2016-06-15 LAB — LIPID PANEL
CHOL/HDL RATIO: 2.6 ratio
Cholesterol: 148 mg/dL (ref 0–200)
HDL: 56 mg/dL (ref >40)
LDL CALC: 83 mg/dL (ref 0–99)
Triglycerides: 47 mg/dL (ref <150)
VLDL: 9 mg/dL (ref 0–40)

## 2016-06-15 LAB — GLUCOSE, CAPILLARY
GLUCOSE-CAPILLARY: 73 mg/dL (ref 65–99)
Glucose-Capillary: 114 mg/dL — ABNORMAL HIGH (ref 65–99)

## 2016-06-15 LAB — URINE CULTURE: SPECIAL REQUESTS: NORMAL

## 2016-06-15 MED ORDER — TRAZODONE HCL 100 MG PO TABS: 100.0000 mg | ORAL_TABLET | Freq: Every evening | ORAL | Status: DC | PRN

## 2016-06-15 MED ORDER — HYDROXYZINE HCL 50 MG PO TABS: 50.0000 mg | ORAL_TABLET | Freq: Three times a day (TID) | ORAL | Status: DC | PRN

## 2016-06-15 MED ORDER — ACETAMINOPHEN 325 MG PO TABS
650.0000 mg | ORAL_TABLET | Freq: Four times a day (QID) | ORAL | Status: DC | PRN
  Filled 2016-06-15: qty 2

## 2016-06-15 MED ORDER — RISPERIDONE MICROSPHERES 50 MG IM SUSR
50.0000 mg | Freq: Once | INTRAMUSCULAR | Status: DC
  Filled 2016-06-15: qty 2

## 2016-06-15 MED ORDER — MAGNESIUM HYDROXIDE 400 MG/5ML PO SUSP: 30.0000 mL | Freq: Every day | ORAL | Status: DC | PRN

## 2016-06-15 MED ORDER — ALUM & MAG HYDROXIDE-SIMETH 200-200-20 MG/5ML PO SUSP: 30.0000 mL | ORAL | Status: DC | PRN

## 2016-06-15 MED ORDER — LITHIUM CARBONATE 300 MG PO CAPS
300.0000 mg | ORAL_CAPSULE | Freq: Three times a day (TID) | ORAL | Status: DC
  Administered 2016-06-15: 300 mg via ORAL
  Filled 2016-06-15: qty 1

## 2016-06-15 MED ORDER — NICOTINE 21 MG/24HR TD PT24
21.0000 mg | MEDICATED_PATCH | Freq: Every day | TRANSDERMAL | Status: DC
  Filled 2016-06-15: qty 1

## 2016-06-15 MED ORDER — AMLODIPINE BESYLATE 5 MG PO TABS
5.0000 mg | ORAL_TABLET | Freq: Every day | ORAL | Status: DC
  Administered 2016-06-15 – 2016-06-26 (×11): 5 mg via ORAL
  Filled 2016-06-15 (×12): qty 1

## 2016-06-15 MED ORDER — RISPERIDONE MICROSPHERES 50 MG IM SUSR
50.0000 mg | INTRAMUSCULAR | Status: DC
  Filled 2016-06-15: qty 2

## 2016-06-15 MED ORDER — PALIPERIDONE PALMITATE 234 MG/1.5ML IM SUSP
234.0000 mg | Freq: Once | INTRAMUSCULAR | Status: DC
  Filled 2016-06-15: qty 1.5

## 2016-06-15 MED ORDER — ACETAMINOPHEN 325 MG PO TABS: 650.0000 mg | ORAL_TABLET | Freq: Four times a day (QID) | ORAL | Status: DC | PRN

## 2016-06-15 MED ORDER — INSULIN ASPART 100 UNIT/ML ~~LOC~~ SOLN: 0.0000 [IU] | Freq: Three times a day (TID) | SUBCUTANEOUS | Status: DC

## 2016-06-15 MED ORDER — RISPERIDONE 1 MG PO TBDP
2.0000 mg | ORAL_TABLET | Freq: Two times a day (BID) | ORAL | Status: DC
  Administered 2016-06-15 – 2016-06-26 (×21): 2 mg via ORAL
  Filled 2016-06-15 (×12): qty 2
  Filled 2016-06-15: qty 1
  Filled 2016-06-15 (×6): qty 2
  Filled 2016-06-15: qty 1

## 2016-06-15 MED ORDER — DIVALPROEX SODIUM 500 MG PO DR TAB
500.0000 mg | DELAYED_RELEASE_TABLET | Freq: Two times a day (BID) | ORAL | Status: DC
  Administered 2016-06-15 (×2): 500 mg via ORAL
  Filled 2016-06-15 (×3): qty 1

## 2016-06-15 NOTE — Plan of Care (Signed)
Problem: Health Behavior/Discharge Planning: Goal: Ability to manage health-related needs will improve Outcome: Not Progressing Denies need to be here, resistant to care
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
## 2016-06-15 NOTE — Progress Notes (Signed)
Pt visible on unit, calmly pacing, minimal interaction observed with staff or peers.  Pt frequently talks to self, unable to understand what he is saying.  Pt also tangentially mumbles, mostly about wanting to be discharged or his refusal to go back to a group home ("I'd rather be on the streets.") but much of conversation is unintelligible.  Pt is med compliant but grumbles in defiance about taking them.  Requests to have shoes from locker - navy blue sneakers given, laces removed and placed in locker.

## 2016-06-15 NOTE — B[facility] Group Notes (Signed)
[facility] Group Notes:  (Nursing/MHT/Case Management/Adjunct)  Date:  06/15/2016  Time:  11:26 PM  Type of Therapy:  Psychoeducational Skills  Participation Level:  Did Not Attend   Foy GuadalajaraJasmine R Love Milbourne 06/15/2016, 11:26 PM

## 2016-06-15 NOTE — [facility] Suicide Risk Assessment (Signed)
[facility] INPATIENT:  Family/Significant Other Suicide Prevention Education  Suicide Prevention Education:  Patient Refusal for Family/Significant Other Suicide Prevention Education: The patient [name] has refused to provide written consent for family/significant other to be provided Family/Significant Other Suicide Prevention Education during admission and/or prior to discharge.  Physician notified.  No indication of suicidal ideation at the time of admission.  Carloyn JaegerMareida J Grossman-Orr 06/15/2016, 11:30 AM

## 2016-06-15 NOTE — Tx Team (Signed)
Initial Treatment Plan 06/15/2016 2:14 AM Adela LankWillie C Furtick AVW:[id number]    PATIENT STRESSORS: Health problems Loss of living arrangement   PATIENT STRENGTHS: General fund of knowledge Motivation for treatment/growth   PATIENT IDENTIFIED PROBLEMS:                      DISCHARGE CRITERIA:  Ability to meet basic life and health needs Adequate post-discharge living arrangements Improved stabilization in mood, thinking, and/or behavior  PRELIMINARY DISCHARGE PLAN: Outpatient therapy Placement in alternative living arrangements  PATIENT/FAMILY INVOLVEMENT: This treatment plan has been presented to and reviewed with the patient, Adela LankWillie C Alumbaugh.  The patient has been given the opportunity to ask questions and make suggestions.  Milon ScoreValinda Blaiden Werth, RN 06/15/2016, 2:14 AM

## 2016-06-15 NOTE — [facility] Group Notes (Signed)
[facility]:  (Clinical Social Work)   06/15/2016    1:00-2:00PM  Summary of Progress/Problems:   The main focus of today's process group was to   1)  discuss importance of adding supports to stay well once out of the hospital  2)  generate ideas about what healthy supports can be added  3)  provide support regarding patient fears about discharge  4)  assess motivation level to make the necessary changes to stay well and out of the hospital  An emphasis was placed on using counselor, doctor, therapy groups, 12-step groups, and problem-specific support groups to expand supports.    The patient stated he wants to never sign himself back into an Assisted Living facility, asked if he could be sent to live independently in [place], or maybe even to a shelter then eventually in his own apartment.  He talked to himself in part of group, did not acknowledge CSW's attempts to silence him, but eventually left the room then returned (several times).  He said his ultimate goal is to go to [place] where he is from.  After group, he was tearful and approached CSW in the hall to once again ask whether he will get to make his own decisions about whether to go to another facility.  CSW stated to him that because he is now his own guardian, he in fact can make his own living arrangements, then assured him that regular social worker will be back tomorrow and can work with him on a discharge plan.  It was not mentioned by him or by CSW that there has been some discussion by his ACT Team of him being assigned a guardian.  Type of Therapy:  Process Group with Motivational Interviewing  Participation Level:  Minimal  Participation Quality:  Inattentive  Affect:  Flat and Irritable  Cognitive:  Hallucinating  Insight:  Poor  Engagement in Therapy:  Limited  Modes of Intervention:   Education, Support and Processing  Ambrose MantleMareida Grossman-Orr, LCSW 06/15/2016    2:17 PM

## 2016-06-15 NOTE — Progress Notes (Signed)
Patient arrived on the unit from the ED at 0001 from the ED accompanied by Nurse and Security.  He complained of fatigue and sleepiness.  Gait was observed to be steady. He also requested food and was given crackers and  peanut butter. He answered most assessment questions with either yes or no.  He was cooperative but had minimal answers to questions.  Comfort was promoted. He declined to have information about the unit and indicated he was familiar with this unit.  No contraband was found in belongings check. He denies SI/HI/AV/H. Patient declined to sign assessment papers and was observed sleeping by [id number].
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
## 2016-06-15 NOTE — [facility] Suicide Risk Assessment (Signed)
[facility]   Nursing information obtained from:    Demographic factors:    Current Mental Status:    Loss Factors:    Historical Factors:    Risk Reduction Factors:     Total Time spent with patient: 1 hour Principal Problem: Schizoaffective disorder, bipolar type (HCC) Diagnosis:   Patient Active Problem List   Diagnosis Date Noted  . Schizoaffective disorder, bipolar type (HCC) [F25.0] 06/15/2016  . Hypertension [I10] 06/13/2016  . Elevated blood sugar [R73.9] 06/13/2016  . Tobacco use disorder [F17.200] 10/04/2015  . Noncompliance [Z91.19] 10/03/2015   Subjective Data: agitation.  Continued Clinical Symptoms:  Alcohol Use Disorder Identification Test Final Score (AUDIT): 0 The "Alcohol Use Disorders Identification Test", Guidelines for Use in Primary Care, Second Edition.  World Science writerHealth Organization [facility](WHO). Score between 0-7:  no or low risk or alcohol related problems. Score between 8-15:  moderate risk of alcohol related problems. Score between 16-19:  high risk of alcohol related problems. Score 20 or above:  warrants further diagnostic evaluation for alcohol dependence and treatment.   CLINICAL FACTORS:   Bipolar Disorder:   Mixed State   Musculoskeletal: Strength & Muscle Tone: within normal limits Gait & Station: normal Patient leans: N/A  Psychiatric Specialty Exam: Physical Exam  Nursing note and vitals reviewed. Psychiatric: He has a normal mood and affect. His speech is normal and behavior is normal. Thought content is paranoid and delusional. Cognition and memory are normal. He expresses impulsivity.    Review of Systems  Psychiatric/Behavioral: Positive for hallucinations. The patient has insomnia.   All other systems reviewed and are negative.   Blood pressure 136/63, pulse 60, temperature 97.8 F (36.6 C), temperature source Oral, resp. rate 18, height 5\' 7"  (1.702 m), SpO2 98 %.There is no height or weight on file to  calculate BMI.  General Appearance: Casual  Eye Contact:  Good  Speech:  Slurred  Volume:  Normal  Mood:  Euthymic  Affect:  Appropriate  Thought Process:  Goal Directed and Descriptions of Associations: Intact  Orientation:  Full (Time, Place, and Person)  Thought Content:  Delusions and Paranoid Ideation  Suicidal Thoughts:  No  Homicidal Thoughts:  No  Memory:  Immediate;   Fair Recent;   Fair Remote;   Fair  Judgement:  Poor  Insight:  Lacking  Psychomotor Activity:  Normal  Concentration:  Concentration: Fair and Attention Span: Fair  Recall:  [employer]  Fund of Knowledge:  Fair  Language:  Fair  Akathisia:  No  Handed:  Right  AIMS (if indicated):     Assets:  Communication Skills Desire for Improvement Financial Resources/Insurance Physical Health Resilience Social Support  ADL's:  Intact  Cognition:  WNL  Sleep:  Number of Hours: 5.15      COGNITIVE FEATURES THAT CONTRIBUTE TO RISK:  None    SUICIDE RISK:   Moderate:  Frequent suicidal ideation with limited intensity, and duration, some specificity in terms of plans, no associated intent, good self-control, limited dysphoria/symptomatology, some risk factors present, and identifiable protective factors, including available and accessible social support.  PLAN OF CARE: Hospital admission, medication management, discharge planning.  [name] is a 56 year old male with a history of schizoaffective disorder admitted for agitated and threatening behavior at the group home in the context of medication noncompliance.  1. Agitation. This was resolved.  2. Mood and psychosis. The patient has been maintained on Risperdal Consta injection. His injection is due today. In addition he has  been taking oral Risperdal. In the emergency room he was started on Depakote and lithium. I will continue Depakote but discontinue lithium as the patient is most likely to end up at the homeless shelter and in the heat.  3. Insomnia.  Trazodone is available.  4. Hypertension. He is on amlodipine.  5. Borderline diabetes. He is on sliding scale insulin, ADA diet, and blood glucose monitoring.  6. Anxiety. Vistaril is available.  7. Smoking. Nicotine patch is available.  8. Metabolic syndrome monitoring. Labs are pending.   9. EKG. Pending.   10. Disposition. The patient was discharged from assisted living facility and will need placement. Apparently he is our team has been working on independent apartment that he will be ready in August. The patient wants to go to the homeless shelter until apartment ready. He works with [employer].     I certify that inpatient services furnished can reasonably be expected to improve the patient's condition.   Kristine Linea, MD 06/15/2016, 9:10 AM

## 2016-06-16 LAB — HEMOGLOBIN A1C
HEMOGLOBIN A1C: 4.8 % (ref 4.8–5.6)
MEAN PLASMA GLUCOSE: 91 mg/dL

## 2016-06-16 MED ORDER — NICOTINE 21 MG/24HR TD PT24
21.0000 mg | MEDICATED_PATCH | Freq: Every day | TRANSDERMAL | Status: DC
  Filled 2016-06-16 (×3): qty 1

## 2016-06-16 MED ORDER — AMANTADINE HCL 100 MG PO CAPS
100.0000 mg | ORAL_CAPSULE | Freq: Two times a day (BID) | ORAL | Status: DC
  Administered 2016-06-16 – 2016-06-25 (×18): 100 mg via ORAL
  Filled 2016-06-16 (×18): qty 1

## 2016-06-16 MED ORDER — LITHIUM CARBONATE ER 450 MG PO TBCR
450.0000 mg | EXTENDED_RELEASE_TABLET | Freq: Two times a day (BID) | ORAL | Status: DC
  Administered 2016-06-16 – 2016-06-20 (×9): 450 mg via ORAL
  Filled 2016-06-16 (×9): qty 1

## 2016-06-16 MED ORDER — LORAZEPAM 2 MG PO TABS
2.0000 mg | ORAL_TABLET | Freq: Every day | ORAL | Status: DC
  Administered 2016-06-16 – 2016-06-25 (×10): 2 mg via ORAL
  Filled 2016-06-16 (×10): qty 1

## 2016-06-16 NOTE — Plan of Care (Signed)
Problem: Potential for Harm to Self or Others Goal: Accepts need for medications Outcome: Progressing Patient has displayed a worried and preoccupied affect this evening.  When assessed, he starts talking about "not wanting assisted living" and is tangential.  He complained about taking Depakote but was compliant.

## 2016-06-16 NOTE — Progress Notes (Signed)
   06/16/16 1425  Clinical Encounter Type  Visited With Patient  Visit Type Spiritual support;Behavioral Health  Referral From Patient  Spiritual Encounters  Spiritual Needs Emotional  Stress Factors  Patient Stress Factors Financial concerns   Patient approached Chaplain for conversation while Chaplain was rounding. Chaplain listened and provided emotional and spiritual support. Patient thinks that he will be discharged in seven days to a homeless shelter. Patient has a plan in place and will seek the needed guidance and support to continue working the plan.  Nona Dellhristina J Kari Montero, Chaplain

## 2016-06-16 NOTE — [facility] Group Notes (Signed)
[facility] LCSW Group Therapy Note  Date/Time: 06/16/16, 1PM  Type of Therapy and Topic:  Group Therapy:  Overcoming Obstacles  Participation Level:  ACTIVE  Description of Group:    In this group patients will be encouraged to explore what they see as obstacles to their own wellness and recovery. They will be guided to discuss their thoughts, feelings, and behaviors related to these obstacles. The group will process together ways to cope with barriers, with attention given to specific choices patients can make. Each patient will be challenged to identify changes they are motivated to make in order to overcome their obstacles. This group will be process-oriented, with patients participating in exploration of their own experiences as well as giving and receiving support and challenge from other group members.  Therapeutic Goals: 1. Patient will identify personal and current obstacles as they relate to admission. 2. Patient will identify barriers that currently interfere with their wellness or overcoming obstacles.  3. Patient will identify feelings, thought process and behaviors related to these barriers. 4. Patient will identify two changes they are willing to make to overcome these obstacles:    Summary of Patient Progress  Pt thoughts a bit disorganized.  He shares that he doesn't feel he needs to stay in the hospital as long as the doctor thinks he does.  He shares feelings of frustration and that not being heard or taken seriously is his biggest obstacle.  He verbalizes frustration over the lack of control in his life.  Depending on payee, ACTT teams, or group homes to meet his needs.  He agrees to try to look at being in the hospital as an opportunity to develop a plan rather than an obstacle to his goals.     Therapeutic Modalities:   Cognitive Behavioral Therapy Solution Focused Therapy Motivational Interviewing Relapse Prevention Therapy  Glennon MacSara P Mindie Rawdon 06/16/2016, 2:44 PM
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
## 2016-06-16 NOTE — Progress Notes (Signed)
D: Pt denies SI/HI/AVH, affect is flat and sad, mood is tense he appears irritable and angry. Pt appears anxious and he is not interacting with peers and staff appropriately. Patients thoughts are disorganized.  A: Pt was offered support and encouragement. Pt was given scheduled medications. Pt was encouraged to attend groups. Q 15 minute checks were done for safety.  R: Pt attends group and interacts well with peers. Pt is taking medication. Pt has no complaints.Pt receptive to treatment and safety maintained on unit.

## 2016-06-17 MED ORDER — RISPERIDONE MICROSPHERES 50 MG IM SUSR
50.0000 mg | INTRAMUSCULAR | Status: DC
  Administered 2016-06-17: 50 mg via INTRAMUSCULAR
  Filled 2016-06-17: qty 2

## 2016-06-17 NOTE — [facility] Group Notes (Signed)
[facility] LCSW Group Therapy  [facility] LCSW Group Therapy Note  Date/Time:06/17/2016, 1pm  Type of Therapy/Topic:  Group Therapy:  Feelings about Diagnosis  Participation Level:  Active   Mood: frustrated   Description of Group:    This group will allow patients to explore their thoughts and feelings about diagnoses they have received. Patients will be guided to explore their level of understanding and acceptance of these diagnoses. Facilitator will encourage patients to process their thoughts and feelings about the reactions of others to their diagnosis, and will guide patients in identifying ways to discuss their diagnosis with significant others in their lives. This group will be process-oriented, with patients participating in exploration of their own experiences as well as giving and receiving support and challenge from other group members.   Therapeutic Goals: 1. Patient will demonstrate understanding of diagnosis as evidence by identifying two or more symptoms of the disorder:  2. Patient will be able to express two feelings regarding the diagnosis 3. Patient will demonstrate ability to communicate their needs through discussion and/or role plays  Summary of Patient Progress:  Pt verbalizes wanting to be discharged. Shares frustrations about his diagnosis and lack of control he feels that gives him.  Verbalizes that he doesn't think he really has symptoms until he's drinking alcohol.  He shares about his ACTT team that may have an apartment for him in August and that he is very excited about that.      Therapeutic Modalities:   Cognitive Behavioral Therapy Brief Therapy Feelings Identification      Glennon MacSara P Shakesha Soltau 06/17/2016, 5:19 PM

## 2016-06-17 NOTE — [facility] Group Notes (Signed)
[facility] Group Notes:  (Nursing/MHT/Case Management/Adjunct)  Date:  06/17/2016  Time:  1:37 PM  Type of Therapy:  Psychoeducational Skills  Participation Level:  Did Not Attend  Lynelle SmokeCara Travis [facility] CenterMadoni 06/17/2016, 1:37 PM

## 2016-06-17 NOTE — Progress Notes (Signed)
Recreation Therapy Notes  At approximately 10:55 am, LRT attempted assessment. Patient requested for LRT to come back later.  [name],[name], LRT/CTRS 06/17/2016 2:35 PM

## 2016-06-17 NOTE — Plan of Care (Signed)
Problem: Health Behavior/Discharge Planning: Goal: Ability to manage health-related needs will improve Outcome: Progressing Accepted medications willingly and without encouragement

## 2016-06-17 NOTE — Plan of Care (Signed)
Problem: Education: Goal: Will be free of psychotic symptoms Outcome: Progressing Patient was not responding to internal stimuli

## 2016-06-17 NOTE — Progress Notes (Signed)
Recreation Therapy Notes  Date: 06.12.18 Time: 9:30 am Location: Craft Room  Group Topic: Self-expression  Goal Area(s) Addresses:  Patient will identify one color per emotion listed on wheel. Patient will verbalize benefit of using art as a means of self-expression. Patient will verbalize one emotion experienced during session. Patient will be educated on other forms of self-expression.  Behavioral Response: Did not attend  Intervention: Emotion Wheel  Activity: Patients were given an Emotion Wheel worksheet and were instructed to pick a color for each emotion listed on the wheel.  Education: LRT educated patients on other forms of self-expression.  Education Outcome: Patient did not attend group.  Clinical Observations/Feedback: Patient did not attend group.  Shonn Farruggia M, LRT/CTRS 06/17/2016 10:25 AM 

## 2016-06-17 NOTE — Progress Notes (Signed)
Pt visible on unit, minimal interaction observed with staff or peers.  Frequently isolative to room sleeping.  Mumbles tangentially to self frequently.  Statements often incomprehensible.  Pt complied without issue or encouragement with administration of Risperdal consta.  Tolerated well.  During administration, pt overhead a peer laughing in another room and voiced suspicion that they were laughing about him, "I'll give you something to laugh about.."  Reports he and doctor discussed discharge planned for next Tuesday.  No behavioral issues.
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
## 2016-06-18 NOTE — Social Work (Signed)
CSW and patient met with [facility] ACT Team Lead, Thressa Sheller and Department of Social Services [place] social worker, Kennyth Arnold to discuss patient's options at discharge. [place] Team requested APS be involved in guardianship pursuit due to patient's noncompliance. Patient stated that he is uninterested in going to [facility] due to its strict rules and guidelines. Patient stated that he will comply but has a problem with the way cigarettes have been distributed at previous [facility]. CSW explained the reason that [facility] has to have rules and patient expresses some understanding.   CSW provided DSS social worker with letter from attending MD in order to support ACT Team's guardianship pending case. CSW, ACTT Lead, and DSS social worker will work on finding placement for patient as he is set to discharge Monday, 6/18.   Glorious Peach, MSW, LCSW-A 06/18/2016, 4:01PM

## 2016-06-18 NOTE — Progress Notes (Signed)
   06/18/16 1020  Clinical Encounter Type  Visited With Patient  Visit Type Follow-up;Spiritual support  Referral From Patient  Spiritual Encounters  Spiritual Needs Emotional   Patient approached Chaplain in the hallway to speak briefly. Chaplain provided emotional support and listened to patient. Patient appeared in better spirits. Patient is still waiting to be released and looking forward to going out [name] to farm.

## 2016-06-18 NOTE — Plan of Care (Signed)
Problem: Health Behavior/Discharge Planning: Goal: Compliance with prescribed medication regimen will improve Outcome: Progressing Complies with prescribed medication without resistance

## 2016-06-18 NOTE — Progress Notes (Signed)
D: Pt denies SI/HI/AVH, but noted talking to self. Pt is irritable, suspicious and angry with staff. Pt thoughts are disorganized, his  speech is rapid and sometimes slurred he appears anxious but he is interacting with peers appropriately.  A: Pt was offered support and encouragement. Pt was given scheduled medications. Pt was encouraged to attend groups. Q 15 minute checks were done for safety.  R:Pt attends groups and interacts well with peers and staff. Pt is taking medication.Pt is not receptive to treatment and safety maintained on unit.

## 2016-06-18 NOTE — [facility] Group Notes (Signed)
[facility] LCSW Group Therapy  06/18/2016 1:49 PM  Type of Therapy:  Group Therapy  Participation Level:  Minimal  Participation Quality:  Attentive  Affect:  Appropriate  Cognitive:  Alert  Insight:  Limited  Engagement in Therapy:  Improving  Modes of Intervention:  Activity, Discussion, Education, Problem-solving, Reality Testing, Socialization and Support  Summary of Progress/Problems: Emotional Regulation: Patients will identify both negative and positive emotions. They will discuss emotions they have difficulty regulating and how they impact their lives. Patients will be asked to identify healthy coping skills to combat unhealthy reactions to negative emotions.   Lani Mendiola G. Garnette CzechSampson MSW, LCSWA 06/18/2016, 1:49 PM

## 2016-06-18 NOTE — Progress Notes (Signed)
Pt occasionally isolative to room, otherwise walks hallways with minimal interaction observed with staff or peers.  Calm and cooperative.  Continues talking to self - mostly unintelligible.  Poor participation in assessment - shakes head and mumbles 'no' then walks away, ending conversation.  Continues to saying that he wants to be discharged to shelter.  Med compliant without encouragement.

## 2016-06-18 NOTE — Progress Notes (Signed)
June 18, 2016 Patient Identification: [name] MRN:  [id number] Date of Evaluation:  06/15/2016 Chief Complaint:  Schizoaffect Principal Diagnosis: Schizoaffective disorder, bipolar type (HCC)  To the division of social services from [facility]:  [name] is a 56 year old single African-American male with a diagnosis of schizoaffective disorder bipolar type. He is well-known to our service as he has been hospitalized here a multitude of times. He has a history of being noncompliance with medications which leads to his frequent hospitalizations. He also has history of becoming agitated and aggressive at his group homes when psychotic.  The patient has cognitive impairment that appears to be secondary to chronic, severe mental illness. He has very poor insight into his condition and its severity. His judgment is usually very poor. He is currently requesting to be discharged to the homeless shelter. He wishes to leave [facility] and moved to a different town.  He has been homeless before due to his poor judgment.  This patient did not seem to understand the implications of living in a homeless shelter where he is very unlikely to comply with medications. He does not understand than when he is without treatment he becomes agitated, aggressive and psychotic. He does not believe he suffers from a mental illness that requires treatment.     I strongly recommend that consideration of guardianship for this patient as he lacks = of social capacity in order to make decisions about his living situation.   Sincerely,  Radene JourneyAndrea Hernandez MD [facility]
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
## 2016-06-18 NOTE — [place] FL2 (Signed)
[place] MEDICAID FL2 LEVEL OF CARE SCREENING TOOL     IDENTIFICATION  Patient Name: [name] Birthdate: [birth year] Sex: male Admission Date (Current Location): 06/15/2016  [place] and [place] Number:  [name] [id number] [facility] Facility and Address:  [facility], [address] [place], [place] [address]      Provider Number: [id number]  Attending Physician Name and Address:  Van Clines*  Relative Name and Phone Number:       Current Level of Care: Hospital Recommended Level of Care: Family Care Home Prior Approval Number:    Date Approved/Denied:   PASRR Number:    Discharge Plan: Other (Comment) ([facility]/group home)    Current Diagnoses: Patient Active Problem List   Diagnosis Date Noted  . Schizoaffective disorder, bipolar type (HCC) 06/15/2016  . Hypertension 06/13/2016  . Tobacco use disorder 10/04/2015  . Noncompliance 10/03/2015    Orientation RESPIRATION BLADDER Height & Weight      (x4)  Normal Continent Weight:   Height:  5\' 7"  (170.2 cm)  BEHAVIORAL SYMPTOMS/MOOD NEUROLOGICAL BOWEL NUTRITION STATUS   (N/A)  (N/A) Continent  (N/A)  AMBULATORY STATUS COMMUNICATION OF NEEDS Skin   Independent Verbally Normal                       Personal Care Assistance Level of Assistance   (N/A)           Functional Limitations Info   (N/A)          SPECIAL CARE FACTORS FREQUENCY   (N/A)                    Contractures Contractures Info: Not present    Additional Factors Info   (Full code)               Current Medications (06/18/2016):  This is the current hospital active medication list Current Facility-Administered Medications  Medication Dose Route Frequency Provider Last Rate Last Dose  . acetaminophen (TYLENOL) tablet 650 mg  650 mg Oral Q6H PRN Clapacs, John T, MD      . alum & mag hydroxide-simeth (MAALOX/MYLANTA) 200-200-20 MG/5ML suspension 30 mL  30 mL Oral Q4H PRN Clapacs, John  T, MD      . amantadine (SYMMETREL) capsule 100 mg  100 mg Oral BID Jimmy Footman, MD   100 mg at 06/18/16 0847  . amLODipine (NORVASC) tablet 5 mg  5 mg Oral Daily Clapacs, Jackquline Denmark, MD   5 mg at 06/18/16 0847  . lithium carbonate (ESKALITH) CR tablet 450 mg  450 mg Oral Q12H Jimmy Footman, MD   450 mg at 06/18/16 0849  . LORazepam (ATIVAN) tablet 2 mg  2 mg Oral QHS Jimmy Footman, MD   2 mg at 06/17/16 2247  . nicotine (NICODERM CQ - dosed in mg/24 hours) patch 21 mg  21 mg Transdermal Daily Pucilowska, [name] B, MD      . nicotine (NICODERM CQ - dosed in mg/24 hours) patch 21 mg  21 mg Transdermal Daily Hernandez-Gonzalez, Sue Lush, MD      . risperiDONE (RISPERDAL M-TABS) disintegrating tablet 2 mg  2 mg Oral BID Clapacs, Jackquline Denmark, MD   2 mg at 06/18/16 0846  . risperiDONE microspheres (RISPERDAL CONSTA) injection 50 mg  50 mg Intramuscular Q14 Days Jimmy Footman, MD   50 mg at 06/17/16 1411     Discharge Medications: Please see discharge summary for a list of discharge medications.  Relevant Imaging Results:  Relevant Lab Results:   Additional Information    [name], LCSWA

## 2016-06-18 NOTE — Progress Notes (Signed)
Recreation Therapy Notes  Date: 06.13.18 Time: 9:30 am Location: Craft Room  Group Topic: Self-esteem  Goal Area(s) Addresses:  Patient will write at least one positive trait about self. Patient will verbalize benefit of having a healthy self-esteem.  Behavioral Response: Attentive  Intervention: I Am  Activity: Patients were given a worksheet with the letter I on it and were instructed to write as many positive traits inside the letter.  Education: LRT educated patients on ways to increase their self-esteem.  Education Outcome: In group clarification offered  Clinical Observations/Feedback: Patient worked on Film/video editorworksheet. Patient did not contribute to group discussion.  [name],[name] Goodlin M, LRT/CTRS 06/18/2016 10:13 AM

## 2016-06-19 NOTE — [facility] Group Notes (Signed)
[facility] Group Notes:  (Nursing/MHT/Case Management/Adjunct)  Date:  06/19/2016  Time:  10:13 PM  Type of Therapy:  Psychoeducational Skills  Participation Level:  Did Not Attend   Foy GuadalajaraJasmine R Caitlin Ainley 06/19/2016, 10:13 PM

## 2016-06-19 NOTE — Plan of Care (Signed)
Problem: Safety: Goal: Ability to redirect hostility and anger into socially appropriate behaviors will improve Outcome: Progressing patietn is able through redirection to redirect hostility and anger into social appropriate behavior.

## 2016-06-19 NOTE — Progress Notes (Signed)
Denies SI/HIAVH although patient is seen talking to himself.  Patient Delusional and easily irritated.  Support offered.  Safety maintainted.
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
## 2016-06-19 NOTE — Plan of Care (Signed)
Problem: Safety: Goal: Ability to remain free from injury will improve Outcome: Progressing Pt safe on the unit at this time   

## 2016-06-19 NOTE — Progress Notes (Signed)
D: Pt denies SI/HI/AVH. Pt is pleasant and cooperative. Pt stayed in his room the majority of the evening . Pt appeared disorganized when talking this evening. Pt presented with flight of ideas.   A: Pt was offered support and encouragement. Pt was given scheduled medications. Pt was encourage to attend groups. Q 15 minute checks were done for safety.   R: safety maintained on unit.

## 2016-06-19 NOTE — Progress Notes (Signed)
Patient ID: [name], male   DOB: 09/27/1960, 56 y.o.   MRN: [id number]  CSW faxed FL2 to patient's APS worker, Dalene SeltzerBrittany Melvin with [place] [facility] [place] [facility]. CSW also left voicemail for Mrs. Alinda MoneyMelvin informing her that FL2 was faxed and also provided her with CSW contact information if needed.  Deina Lipsey G. Garnette CzechSampson MSW, [place] NorthCSWA 06/19/2016 10:55 AM

## 2016-06-19 NOTE — Progress Notes (Signed)
Recreation Therapy Notes  Date: 06.14.18 Time: 9:30 am Location: Craft Room  Group Topic: Leisure Education  Goal Area(s) Addresses:  Patient will identify things they are grateful for. Patient will identify how being grateful can influence decision making.  Behavioral Response: Attentive  Intervention: LexicographerGrateful Wheel  Activity: Patients were given an I Am Grateful For worksheet and were instructed to write things they are grateful for under each category.   Education: LRT educated patients on leisure.  Education Outcome: In group clarification offered  Clinical Observations/Feedback: Patient worked on Film/video editorworksheet. Patient did not contribute to group discussion.  [name],Viktoriya Glaspy M, LRT/CTRS 06/19/2016 10:16 AM

## 2016-06-19 NOTE — [facility] Group Notes (Signed)
[facility] LCSW Group Therapy  06/19/2016 1:49 PM  Type of Therapy:  Group Therapy  Participation Level:  None  Participation Quality:  Inattentive  Affect:  Flat  Cognitive:  Alert  Insight:  None  Engagement in Therapy:  None  Modes of Intervention:  Activity, Discussion, Education, Problem-solving, Dance movement psychotherapisteality Testing, Socialization and Support  Summary of Progress/Problems: Balance in life: Patients will discuss the concept of balance and how it looks and feels to be unbalanced. Pt will identify areas in their life that is unbalanced and ways to become more balanced. They discussed what aspects in their lives has influenced their self care. Patients also discussed self care in the areas of self regulation/control, hygiene/appearance, sleep/relaxation, healthy leisure, healthy eating habits, exercise, inner peace/spirituality, self improvement, sobriety, and health management. They were challenged to identify changes that are needed in order to improve self care. Patient arrived to group late and left group after about 5 minutes. Patient did not return to group.   Paulyne Mooty G. Garnette CzechSampson MSW, LCSWA 06/19/2016, 1:50 PM

## 2016-06-19 NOTE — [facility] Group Notes (Signed)
Goals Group Date/Time: 06/19/2016 9:00 AM Type of Therapy and Topic: Group Therapy: Goals Group: SMART Goals   Participation Level: Moderate  Description of Group:    The purpose of a daily goals group is to assist and guide patients in setting recovery/wellness-related goals. The objective is to set goals as they relate to the crisis in which they were admitted. Patients will be using SMART goal modalities to set measurable goals. Characteristics of realistic goals will be discussed and patients will be assisted in setting and processing how one will reach their goal. Facilitator will also assist patients in applying interventions and coping skills learned in psycho-education groups to the SMART goal and process how one will achieve defined goal.   Therapeutic Goals:   -Patients will develop and document one goal related to or their crisis in which brought them into treatment.  -Patients will be guided by LCSW using SMART goal setting modality in how to set a measurable, attainable, realistic and time sensitive goal.  -Patients will process barriers in reaching goal.  -Patients will process interventions in how to overcome and successful in reaching goal.   Patient's Goal: Pt goal is to discharge as quickly as possible.   Therapeutic Modalities:  Motivational Interviewing  Research officer, political partyCognitive Behavioral Therapy  Crisis Intervention Model  SMART goals setting   Daleen SquibbGreg Lennette Fader, KentuckyLCSW

## 2016-06-19 NOTE — Progress Notes (Signed)
D: Pt denies SI/HI/AVH, but noted talking to internal stimuli. Pt is irritable, suspicious and angry with staff. Pt thoughts are disorganized, his  speech is rapid and slurred, he appears anxious but he is not  interacting with peers and staff appropriately.  A: Pt was offered support and encouragement. Pt was given scheduled medications. Pt was encouraged to attend groups. Q 15 minute checks were done for safety.  R: Pt did not attend group. Pt is complaint with medication.Pt is not receptive to treatment and safety maintained on unit.

## 2016-06-20 MED ORDER — LORAZEPAM 1 MG PO TABS
1.0000 mg | ORAL_TABLET | Freq: Every day | ORAL | Status: DC
  Administered 2016-06-20 – 2016-06-26 (×7): 1 mg via ORAL
  Filled 2016-06-20 (×7): qty 1

## 2016-06-20 MED ORDER — LITHIUM CARBONATE ER 300 MG PO TBCR
600.0000 mg | EXTENDED_RELEASE_TABLET | Freq: Two times a day (BID) | ORAL | Status: DC
  Administered 2016-06-20 – 2016-06-25 (×10): 600 mg via ORAL
  Filled 2016-06-20 (×11): qty 2

## 2016-06-20 NOTE — Progress Notes (Signed)
Thought processes disorganized and has flight of ideas.  During evening med pass states "Every time I go to sleep I hear my daughter crying.  If someone is messing with my child these white folks are going to have to pay. " Then states " how can they tell me what to do with my money in my house"  Noted talking to self.  Paces halls at times.  Medication compliant.  Attends groups. Support offered.  Safety checks maintained.
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
## 2016-06-20 NOTE — Progress Notes (Signed)
   06/20/16 1400  Clinical Encounter Type  Visited With Patient;Other (Comment) (Emotional Support Group )  Visit Type Spiritual support;Social support;Behavioral Health;Other (Comment)  Referral From (Group session)   Patient attended group session that began at 2 pm. Patient didn't participate.

## 2016-06-20 NOTE — Plan of Care (Signed)
Problem: Potential for Harm to Self or Others Goal: Accepts need for medications Outcome: Progressing Medication compliant.

## 2016-06-20 NOTE — Progress Notes (Signed)
Patient ID: [name], male   DOB: 01/22/1960, 56 y.o.   MRN: [id number]  Case Management Progress on Family Care Home Placement:  CSW spoke with Liborio NixonJanice from [facility], informed CSW that patient has been DENIED from all homes managed by Jimmye NormanLawanda Ray including,  [facility]  The following placements had NO current beds available for males:  [facility] (only male beds) [facility]  Patient was denied from the following Family care homes:  [facility]- DEN[facility]  Placements Pending Review  [facility] in [place]. CSW spoke with Group home manager Lupita LeashDonna [phone]). Received FL2, will review patients information and call CSW back tomorrow to potentially schedule interview with patient.  [facility] in [place]. CSW spoke with Group home manager Feliz Beamravis [phone]. [facility] has bed coming available soon. CSW faxed FL2.   [facility] [place]. Group home manager Alinda Moneyony [phone]. Has open bed, will review patient's FL2 and follow-up with the CSW.     Xiomara Sevillano G. Garnette CzechSampson MSW, [facility]etaCSWA 06/20/2016 4:41 PM

## 2016-06-20 NOTE — [facility] Group Notes (Signed)
[facility] Group Notes:  (Nursing/MHT/Case Management/Adjunct)  Date:  06/20/2016  Time:  4:01 PM  Type of Therapy:  Psychoeducational Skills  Participation Level:  Minimal  Participation Quality:  Inattentive  Affect:  Appropriate  Cognitive:  Appropriate  Insight:  Appropriate  Engagement in Group:  Lacking  Modes of Intervention:  Discussion, Education and Socialization  Summary of Progress/Problems:  [name] 06/20/2016, 4:01 PM

## 2016-06-20 NOTE — Tx Team (Signed)
Interdisciplinary Treatment and Diagnostic Plan Update  06/20/2016 Time of Session: 10:30am [name] MRN: [id number]  Principal Diagnosis: Schizoaffective disorder, bipolar type [facility](HCC)  Secondary Diagnoses: Principal Problem:   Schizoaffective disorder, bipolar type (HCC) Active Problems:   Noncompliance   Tobacco use disorder   Hypertension   Current Medications:  Current Facility-Administered Medications  Medication Dose Route Frequency Provider Last Rate Last Dose  . acetaminophen (TYLENOL) tablet 650 mg  650 mg Oral Q6H PRN Clapacs, John T, MD      . alum & mag hydroxide-simeth (MAALOX/MYLANTA) 200-200-20 MG/5ML suspension 30 mL  30 mL Oral Q4H PRN Clapacs, John T, MD      . amantadine (SYMMETREL) capsule 100 mg  100 mg Oral BID Jimmy FootmanHernandez-Gonzalez, Andrea, MD   100 mg at 06/20/16 1012  . amLODipine (NORVASC) tablet 5 mg  5 mg Oral Daily Clapacs, John T, MD   5 mg at 06/20/16 1012  . lithium carbonate (LITHOBID) CR tablet 600 mg  600 mg Oral Q12H Hernandez-Gonzalez, Sue LushAndrea, MD      . LORazepam (ATIVAN) tablet 1 mg  1 mg Oral Daily Jimmy FootmanHernandez-Gonzalez, Andrea, MD   1 mg at 06/20/16 1011  . LORazepam (ATIVAN) tablet 2 mg  2 mg Oral QHS Jimmy FootmanHernandez-Gonzalez, Andrea, MD   2 mg at 06/19/16 2208  . nicotine (NICODERM CQ - dosed in mg/24 hours) patch 21 mg  21 mg Transdermal Daily Jimmy FootmanHernandez-Gonzalez, Andrea, MD      . risperiDONE (RISPERDAL M-TABS) disintegrating tablet 2 mg  2 mg Oral BID Clapacs, Jackquline DenmarkJohn T, MD   2 mg at 06/20/16 1011  . risperiDONE microspheres (RISPERDAL CONSTA) injection 50 mg  50 mg Intramuscular Q14 Days Jimmy FootmanHernandez-Gonzalez, Andrea, MD   50 mg at 06/17/16 1411   PTA Medications: Prescriptions Prior to Admission  Medication Sig Dispense Refill Last Dose  . acetaminophen (TYLENOL) 650 MG CR tablet Take 650 mg by mouth every 8 (eight) hours as needed for pain.   Past Week at Unknown time  . alum & mag hydroxide-simeth (MAALOX/MYLANTA) 200-200-20 MG/5ML suspension  Take 30 mLs by mouth every 6 (six) hours as needed for indigestion or heartburn.   Past Week at Unknown time  . amLODipine (NORVASC) 5 MG tablet Take 5 mg by mouth daily.   Past Week at Unknown time  . diphenhydrAMINE (BENADRYL) 50 MG tablet Take 50 mg by mouth at bedtime.   Past Week at Unknown time  . insulin aspart (NOVOLOG) 100 UNIT/ML injection Inject 2-12 Units into the skin 3 (three) times daily before meals. Sliding scale   Past Week at Unknown time  . magnesium hydroxide (MILK OF MAGNESIA) 400 MG/5ML suspension Take 30 mLs by mouth daily as needed for mild constipation.   Past Week at Unknown time  . risperiDONE (RISPERDAL) 1 MG tablet Take 1 mg by mouth 2 (two) times daily.    Past Week at Unknown time  . risperiDONE (RISPERDAL) 3 MG tablet Take 3 mg by mouth at bedtime.   Past Week at Unknown time  . risperiDONE microspheres (RISPERDAL CONSTA) 50 MG injection Inject 50 mg into the muscle every 14 (fourteen) days.   Past Week at Unknown time    Patient Stressors: Health problems Loss of living arrangement  Patient Strengths: General fund of knowledge Motivation for treatment/growth  Treatment Modalities: Medication Management, Group therapy, Case management,  1 to 1 session with clinician, Psychoeducation, Recreational therapy.   Physician Treatment Plan for Primary Diagnosis: Schizoaffective disorder, bipolar type [facility](HCC) Long Term  Goal(s): Improvement in symptoms so as ready for discharge NA   Short Term Goals: Ability to identify changes in lifestyle to reduce recurrence of condition will improve Ability to verbalize feelings will improve Ability to disclose and discuss suicidal ideas Ability to demonstrate self-control will improve Ability to identify and develop effective coping behaviors will improve Compliance with prescribed medications will improve Ability to identify triggers associated with substance abuse/mental health issues will improve NA  Medication  Management: Evaluate patient's response, side effects, and tolerance of medication regimen.  Therapeutic Interventions: 1 to 1 sessions, Unit Group sessions and Medication administration.  Evaluation of Outcomes: Progressing  Physician Treatment Plan for Secondary Diagnosis: Principal Problem:   Schizoaffective disorder, bipolar type (HCC) Active Problems:   Noncompliance   Tobacco use disorder   Hypertension  Long Term Goal(s): Improvement in symptoms so as ready for discharge NA   Short Term Goals: Ability to identify changes in lifestyle to reduce recurrence of condition will improve Ability to verbalize feelings will improve Ability to disclose and discuss suicidal ideas Ability to demonstrate self-control will improve Ability to identify and develop effective coping behaviors will improve Compliance with prescribed medications will improve Ability to identify triggers associated with substance abuse/mental health issues will improve NA     Medication Management: Evaluate patient's response, side effects, and tolerance of medication regimen.  Therapeutic Interventions: 1 to 1 sessions, Unit Group sessions and Medication administration.  Evaluation of Outcomes: Progressing   RN Treatment Plan for Primary Diagnosis: Schizoaffective disorder, bipolar type (HCC) Long Term Goal(s): Knowledge of disease and therapeutic regimen to maintain health will improve  Short Term Goals: Ability to demonstrate self-control, Ability to participate in decision making will improve, Ability to identify and develop effective coping behaviors will improve, Compliance with prescribed medications will improve and Clear thought process  Medication Management: RN will administer medications as ordered by provider, will assess and evaluate patient's response and provide education to patient for prescribed medication. RN will report any adverse and/or side effects to prescribing provider.  Therapeutic  Interventions: 1 on 1 counseling sessions, Psychoeducation, Medication administration, Evaluate responses to treatment, Monitor vital signs and CBGs as ordered, Perform/monitor CIWA, COWS, AIMS and Fall Risk screenings as ordered, Perform wound care treatments as ordered.  Evaluation of Outcomes: Progressing   LCSW Treatment Plan for Primary Diagnosis: Schizoaffective disorder, bipolar type (HCC) Long Term Goal(s): Safe transition to appropriate next level of care at discharge, Engage patient in therapeutic group addressing interpersonal concerns.  Short Term Goals: Engage patient in aftercare planning with referrals and resources and Increase ability to appropriately verbalize feelings  Therapeutic Interventions: Assess for all discharge needs, 1 to 1 time with Social worker, Explore available resources and support systems, Assess for adequacy in community support network, Educate family and significant other(s) on suicide prevention, Complete Psychosocial Assessment, Interpersonal group therapy.  Evaluation of Outcomes: Progressing   Progress in Treatment: Attending groups: Yes. Participating in groups: Yes. Taking medication as prescribed: Yes. Toleration medication: Yes. Family/Significant other contact made: No, will contact:  pt refused.  Patient understands diagnosis: No. Discussing patient identified problems/goals with staff: Yes. Medical problems stabilized or resolved: Yes. Denies suicidal/homicidal ideation: Yes. Issues/concerns per patient self-inventory: No. Other: n/a  New problem(s) identified: None identified at this time.   New Short Term/Long Term Goal(s): "I need to find a new group home".   Discharge Plan or Barriers: Patient will discharge to group home and follow-up with ACTT team.   Reason for Continuation of  Hospitalization: Delusions  Medication stabilization  Estimated Length of Stay: 7 to 10 days.   Attendees: Patient: [name] 06/20/2016  3:16 PM  Physician: Dr. Radene JourneyJayme Cloud, MD 06/20/2016 3:16 PM  Nursing: Leonia Reader, RN 06/20/2016 3:16 PM  RN Care Manager: 06/20/2016 3:16 PM  Social Worker: Fredrich Birks. Garnette Czech MSW, LCSWA 06/20/2016 3:16 PM  Recreational Therapist: Jacquelynn Cree, LRT/CTRS 06/20/2016 3:16 PM  Other:  06/20/2016 3:16 PM  Other:  06/20/2016 3:16 PM  Other: 06/20/2016 3:16 PM    Scribe for Treatment Team: Arelia Longest, LCSWA 06/20/2016 3:16 PM

## 2016-06-20 NOTE — [facility] Group Notes (Signed)
[facility] LCSW Group Therapy Note  Date/Time: 06/20/16, 1300  Type of Therapy and Topic:  Group Therapy:  Feelings around Relapse and Recovery  Participation Level:  Did Not Attend   Mood:  Description of Group:    Patients in this group will discuss emotions they experience before and after a relapse. They will process how experiencing these feelings, or avoidance of experiencing them, relates to having a relapse. Facilitator will guide patients to explore emotions they have related to recovery. Patients will be encouraged to process which emotions are more powerful. They will be guided to discuss the emotional reaction significant others in their lives may have to patients' relapse or recovery. Patients will be assisted in exploring ways to respond to the emotions of others without this contributing to a relapse.  Therapeutic Goals: 1. Patient will identify two or more emotions that lead to relapse for them:  2. Patient will identify two emotions that result when they relapse:  3. Patient will identify two emotions related to recovery:  4. Patient will demonstrate ability to communicate their needs through discussion and/or role plays.   Summary of Patient Progress:     Therapeutic Modalities:   Cognitive Behavioral Therapy Solution-Focused Therapy Assertiveness Training Relapse Prevention Therapy  Greg Earlene Bjelland, LCSW       

## 2016-06-20 NOTE — Progress Notes (Signed)
   06/20/16 1035  Clinical Encounter Type  Visited With Patient;Health care provider  Visit Type Psychological support;Spiritual support;Social support;Behavioral Health (Care Team meeting)   Chaplain was present during care team meeting and patient acknowledge my presence.

## 2016-06-20 NOTE — Progress Notes (Signed)
   06/20/16 1457  Clinical Encounter Type  Visited With Patient  Visit Type Follow-up;Behavioral Health  Referral From Patient  Spiritual Encounters  Spiritual Needs Emotional;Other (Comment)   Patient follow up after Group session. Chaplain responded to patient's request to speak. Patient was upset and crying about not being able to procreate with anyone. Patient spoke about his daughter and wanting to follow the Hebrew God. Patient continues to shift focus during conversations and only has a few moments where he completes a full thought.

## 2016-06-20 NOTE — Progress Notes (Signed)
Recreation Therapy Notes  Date: 06.15.18 Time: 9:30 am Location: Craft Room  Group Topic: Coping Skills  Goal Area(s) Addresses:  Patient will verbalize one emotion experienced in group. Patient will verbalize benefit of using art as a healthy coping skill.  Behavioral Response: Did not attend  Intervention: Coloring  Activity: Patients were given coloring sheets to color and were instructed to think about what emotions they were feeling and what their minds were focused on.  Education: LRT educated patients on healthy coping skills.  Education Outcome: Patient did not attend group.  Clinical Observations/Feedback: Patient did not attend group.  [name],Devona Holmes M, LRT/CTRS 06/20/2016 10:25 AM

## 2016-06-21 DIAGNOSIS — F17200 Nicotine dependence, unspecified, uncomplicated: Secondary | ICD-10-CM

## 2016-06-21 NOTE — Plan of Care (Signed)
Problem: Education: Goal: Will be free of psychotic symptoms Outcome: Not Progressing Pt continues to display psychosis
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
## 2016-06-21 NOTE — Plan of Care (Signed)
Problem: Safety: Goal: Ability to redirect hostility and anger into socially appropriate behaviors will improve Outcome: Progressing Patient was observed ambulating in the hall and mumbling to himself.  He denied needing any help from writer but took evening medications without event.  He participated in snack and behavior was appropriate in the group.

## 2016-06-21 NOTE — Progress Notes (Addendum)
Patient ID: [name], male   DOB: 04/14/1960, 56 y.o.   MRN: [id number]  Case Management Progress on Family Care Home Placement:  Placements Pending Review  [facility] in [place]. CSW spoke with Group home manager [name] [phone]). Received FL2, stated she is trying to schedule interview with Patient on Monday 06/23/2016. Stated would call CSW back on Sunday to confirm a time for interview.    [facility] in [place]. CSW spoke with Group home staff, [name] [phone]. [name] confirmed that he has received CSW's fax and that the group home manager will review the information and contact CSW on Monday.   [facility] in [place]. CSW spoke with family care home staff, confirmed that fax was received. Patient's referral is being reviewed. Group home manager [name] [phone] will need to be contacted Monday to follow-up about the referral.   CSW also spoke with patient and provided him an update on the progress. Placement stated he had no further questions for CSW at this time.   [name] G. Garnette CzechSampson MSW, [facility] 06/21/2016 4:50 PM

## 2016-06-21 NOTE — [facility] Group Notes (Signed)
[facility] LCSW Group Therapy  06/21/2016 2:46 PM  Type of Therapy:  Group Therapy  Participation Level:  None, Patient came to group but left after a few minutes and did not return.   Summary of Progress/Problems: Coping Skills: Patients defined and discussed healthy coping skills. Patients identified healthy coping skills they would like to try during hospitalization and after discharge. CSW offered insight to varying coping skills that may have been new to patients such as practicing mindfulness.  Lyndsie Wallman G. Garnette CzechSampson MSW, LCSWA 06/21/2016, 2:47 PM

## 2016-06-21 NOTE — [facility] Group Notes (Signed)
[facility] Group Notes:  (Nursing/MHT/Case Management/Adjunct)  Date:  06/21/2016  Time:  5:59 AM  Type of Therapy:  Psychoeducational Skills  Participation Level:  Did Not Attend   Summary of Progress/Problems:  Dustin MilroyLaquanda Y Gery [name] 06/21/2016, 5:59 AM

## 2016-06-21 NOTE — Progress Notes (Signed)
Pt has labile mood he is disorganized. He is irritable and gets agitated easily. Pt mumbles to self responding to internal stimuli.Pt visible on unit and paces at times. Pt denies SI and HI. He is compliant with medications. Will continue to monitor .

## 2016-06-22 NOTE — Plan of Care (Signed)
Problem: Alteration in thought process Goal: Knowledge of disease or condition and therapeutic regimen will improve Outcome: Not Progressing Patient verbalizes that he should not be here.  Noted responding to internal stimuli.
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
## 2016-06-22 NOTE — Plan of Care (Signed)
Problem: Coping: Goal: Ability to cope will improve Outcome: Progressing Patient spent time in the day room on and off.  He also stayed in his room and when checked on, he denied needing anything or wanting to talk.  He continues to verbalize negative feelings about others but content is loose and he does not indicate a specific source for his anger.  He took medications without event.

## 2016-06-22 NOTE — Progress Notes (Signed)
Thought processes disorganized, flight of ideas and delusional.   Seen talking to self.  Support offered.  Safety maintained.

## 2016-06-22 NOTE — [facility] Group Notes (Signed)
[facility] Group Notes:  (Nursing/MHT/Case Management/Adjunct)  Date:  06/22/2016  Time:  11:06 PM  Type of Therapy:  Evening Wrap-up Group  Participation Level:  Did Not Attend  Participation Quality:  N/A  Affect:  N/A  Cognitive:  N/A  Insight:  None  Engagement in Group:  Did Not Attend  Modes of Intervention:  Activity and Discussion  Summary of Progress/Problems:  Dustin MorrowChelsea [name] [name] 06/22/2016, 11:06 PM

## 2016-06-22 NOTE — [facility] Group Notes (Signed)
[facility] LCSW Group Therapy  06/22/2016 2:11 PM  Type of Therapy:  Group Therapy  Participation Level:  Minimal  Participation Quality:  Attentive  Affect:  Anxious and Irritable  Cognitive:  Alert and Delusional  Insight:  None  Engagement in Therapy:  Poor  Modes of Intervention:  Discussion, Education, Problem-solving, Reality Testing, Socialization and Support  Summary of Progress/Problems: Goal Setting: The objective is to set goals as they relate to the crisis in which they were admitted. Patients will be using SMART goal modalities to set measurable goals. Characteristics of realistic goals will be discussed and patients will be assisted in setting and processing how one will reach their goal. Facilitator will also assist patients in applying interventions and coping skills learned in psycho-education groups to the SMART goal and process how one will achieve defined goal. Patient was extremely anxious about leaving on Monday and became irritable stating "I just know I better leave Monday or it's gone be some problems". CSW redirected patient to be positive and to continue to work on Pharmacologistcoping skills.   [name] MSW, LCSWA 06/22/2016, 2:19 PM

## 2016-06-23 MED ORDER — RISPERIDONE MICROSPHERES 50 MG IM SUSR
50.0000 mg | INTRAMUSCULAR | 0 refills | Status: DC
Start: 2016-07-01 — End: 2021-05-02

## 2016-06-23 MED ORDER — AMANTADINE HCL 100 MG PO CAPS: 100.0000 mg | ORAL_CAPSULE | Freq: Two times a day (BID) | ORAL | 0 refills | Status: DC

## 2016-06-23 MED ORDER — LORAZEPAM 2 MG PO TABS: 2.0000 mg | ORAL_TABLET | Freq: Every day | ORAL | 0 refills | Status: DC

## 2016-06-23 MED ORDER — LITHIUM CARBONATE ER 300 MG PO TBCR: 600.0000 mg | EXTENDED_RELEASE_TABLET | Freq: Two times a day (BID) | ORAL | 0 refills | Status: DC

## 2016-06-23 MED ORDER — AMLODIPINE BESYLATE 5 MG PO TABS: 5.0000 mg | ORAL_TABLET | Freq: Every day | ORAL | 0 refills | Status: DC

## 2016-06-23 MED ORDER — RISPERIDONE 2 MG PO TABS: 2.0000 mg | ORAL_TABLET | Freq: Two times a day (BID) | ORAL | 0 refills | Status: DC

## 2016-06-23 NOTE — Plan of Care (Signed)
Problem: Coping: Goal: Ability to cope will improve Outcome: Progressing Patient was with the group for brief periods and spent time outside.  He continues to be tangential in response to staff and content seems to include verbal aggression and blaming others.  Content is difficult to follow.  He took medications without event.  He does not appear to be responding to unseen others and denies self harm intent.

## 2016-06-23 NOTE — [facility] Group Notes (Signed)
[facility] LCSW Group Therapy Note  Date/Time: 06/23/16, 1300  Type of Therapy and Topic:  Group Therapy:  Overcoming Obstacles  Participation Level:  none  Description of Group:    In this group patients will be encouraged to explore what they see as obstacles to their own wellness and recovery. They will be guided to discuss their thoughts, feelings, and behaviors related to these obstacles. The group will process together ways to cope with barriers, with attention given to specific choices patients can make. Each patient will be challenged to identify changes they are motivated to make in order to overcome their obstacles. This group will be process-oriented, with patients participating in exploration of their own experiences as well as giving and receiving support and challenge from other group members.  Therapeutic Goals: 1. Patient will identify personal and current obstacles as they relate to admission. 2. Patient will identify barriers that currently interfere with their wellness or overcoming obstacles.  3. Patient will identify feelings, thought process and behaviors related to these barriers. 4. Patient will identify two changes they are willing to make to overcome these obstacles:    Summary of Patient Progress: Pt came to group initially but left in the first 5 minutes and did not return.      Therapeutic Modalities:   Cognitive Behavioral Therapy Solution Focused Therapy Motivational Interviewing Relapse Prevention Therapy  Daleen SquibbGreg Kriss Ishler, LCSW

## 2016-06-23 NOTE — Progress Notes (Signed)
June 23, 2016   Patient Identification: [name] MRN:  [id number] Date of Evaluation:  06/15/2016 Principal Diagnosis: Schizoaffective disorder, bipolar type [facility](HCC)  [facility]: The patient was brought to the emergency room from his group home where he was agitated and unruly for several days. He was in the emergency room several times prior to admission. He reportedly walked away from his group home. The patient claims that he was persecuted there, called names, and harassed. He believes that they kicked him out but is not aware of any no prescribed is given. The chart says that the patient has been noncompliant with medications. The patient claims to take medications as prescribed. In addition he is on Risperdal Consta and has been receiving medications regularly. The patient was very agitated initially in the emergency room and refused multiple doses of medicines.  Patient continues to be quite delusional and has been intermittently agitated. He has very poor insight and judgment. Patient is still not stable for discharge into the community. Recommend to extend his involuntary commitment for up to 30 days.   Sincerely,   Radene JourneyAndrea Hernandez M.D. [facility] [phone]
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
## 2016-06-23 NOTE — Progress Notes (Signed)
Denies SI/HI/AVH.  Speech tangential and has flight of ideas.  Patient became upset after finding out that he was not being discharged.  Presented to nurses station irritated stating "That doctor down  There is prejudice against me.  Everyone is going home except me.  She is prejudice.  My parents are white and I keep them right here(holds hands over his heart)  Patient requested to see social worker.  This Clinical research associatewriter talked to social worker Jake SharkSara Laws and she informed me that a group home was coming to see him on tomorrow and if not would dc him to shelter. This information relayed to patient.  Patient said thank you thank you with big smile on his face.  Support offered.  Safety maintained.
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
## 2016-06-23 NOTE — Progress Notes (Signed)
   06/23/16 1128  Clinical Encounter Type  Visited With Patient;Other (Comment)  Visit Type Follow-up;Spiritual support;Behavioral Health  Referral From Patient  Spiritual Encounters  Spiritual Needs Emotional;Other (Comment)   Patient approached Chaplain in the hall. Chaplain responded to patients request to talk. Patient wanted to know if doctor had arrived yet, as he is waiting to here when he is being discharged. Patient made small talk with Chaplain. Patient remembered our last conversation and asked how things were going.

## 2016-06-23 NOTE — Progress Notes (Signed)
Recreation Therapy Notes  Date: 06.18.18 Time: 9:30 am Location: Craft Room  Group Topic: Self-expression  Goal Area(s) Addresses:  Patient will effectively use art as a means of self-expression. Patient will recognize positive benefit of self-expression. Patient will be able to identify one emotion experienced during group session. Patient will identify use of art as a coping skill.  Behavioral Response: Did not attend  Intervention: Two Faces of Me  Activity: Patients were given a blank face worksheet and were instructed to draw a line down the middle. On one side, patients were instructed to draw or write how they felt when they were admitted to the hospital and on the other side they were instructed to draw or write how they wanted to feel when they are d/c.  Education: LRT educated patients on other forms of self-expression.  Education Outcome: Patient did not attend group.  Clinical Observations/Feedback: Patient did not attend group.  Farin Buhman M, LRT/CTRS 06/23/2016 10:06 AM 

## 2016-06-23 NOTE — Progress Notes (Signed)
Patient ID: [name], male   DOB: 11/03/1960, 56 y.o.   MRN: [id number] CSW faxed info to Jeri Lagerony Miles, Who says he did not receive client information over the weekend.  He will review and come to visit Pt in the morning to see if he would be a good fit for his group home.  CSW notified RN and Dr. Ardyth HarpsHernandez that he would have interview tomorrow.  Jake SharkSara Jaedin Trumbo, LCSW

## 2016-06-24 MED ORDER — ARIPIPRAZOLE 5 MG PO TABS
15.0000 mg | ORAL_TABLET | Freq: Every day | ORAL | Status: DC
  Administered 2016-06-24 – 2016-06-25 (×2): 15 mg via ORAL
  Filled 2016-06-24 (×2): qty 1

## 2016-06-24 NOTE — [facility] Group Notes (Signed)
[facility] Group Notes:  (Nursing/MHT/Case Management/Adjunct)  Date:  06/24/2016  Time:  1:54 PM  Type of Therapy:  Psychoeducational Skills  Participation Level:  Active  Participation Quality:  Appropriate, Attentive and Sharing  Affect:  Appropriate  Cognitive:  Appropriate  Insight:  Appropriate  Engagement in Group:  Engaged  Modes of Intervention:  Discussion, Education and Support  Summary of Progress/Problems:  Lynelle SmokeCara Travis Jaima Janney 06/24/2016, 1:54 PM

## 2016-06-24 NOTE — [facility] Group Notes (Signed)
[facility] LCSW Group Therapy Note  Date/Time  Type of Therapy/Topic:  Group Therapy:  Feelings about Diagnosis  Participation Level:  Did Not Attend    Glennon MacSara P Janautica Netzley, LCSW 06/24/2016, 5:08 PM

## 2016-06-24 NOTE — [facility] Group Notes (Signed)
[facility] Group Notes:  (Nursing/MHT/Case Management/Adjunct)  Date:  06/24/2016  Time:  12:43 AM  Type of Therapy:  Group Therapy\  Participation Level:  Did Not Attend  Participation Quality:Summary of Progress/Problems:  Martin Smeal L Louis Gaw 06/24/2016, 12:43 AM 
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
## 2016-06-24 NOTE — Plan of Care (Signed)
Problem: Safety: Goal: Ability to remain free from injury will improve Outcome: Progressing Pt remains safe while in hospital    

## 2016-06-24 NOTE — [facility] Group Notes (Signed)
Goals Group Date/Time: 06/24/2016 9:00 AM Type of Therapy and Topic: Group Therapy: Goals Group: SMART Goals   Participation Level: minimal  Description of Group:    The purpose of a daily goals group is to assist and guide patients in setting recovery/wellness-related goals. The objective is to set goals as they relate to the crisis in which they were admitted. Patients will be using SMART goal modalities to set measurable goals. Characteristics of realistic goals will be discussed and patients will be assisted in setting and processing how one will reach their goal. Facilitator will also assist patients in applying interventions and coping skills learned in psycho-education groups to the SMART goal and process how one will achieve defined goal.   Therapeutic Goals:   -Patients will develop and document one goal related to or their crisis in which brought them into treatment.  -Patients will be guided by LCSW using SMART goal setting modality in how to set a measurable, attainable, realistic and time sensitive goal.  -Patients will process barriers in reaching goal.  -Patients will process interventions in how to overcome and successful in reaching goal.   Patient's Goal: to work on getting discharged today, Pt plans to try to be very patient while his plan is worked out.   Therapeutic Modalities:  Motivational Interviewing  Engineer, manufacturing systemsCognitive Behavioral Therapy  Crisis Intervention Model  SMART goals setting   Glennon MacSara P Shanele Nissan, LCSW 06/24/2016, 5:07 PM

## 2016-06-24 NOTE — Progress Notes (Signed)
Pt was irritable earlier in the day because he is focused on discharge. He denies SI, HI, a/v hallucinations. He is compliant with meals and medications. Commits to safety. Will continue to monitor for safety.

## 2016-06-24 NOTE — Progress Notes (Signed)
   06/24/16 [id number]  Clinical Encounter Type  Visited With Patient;Other (Comment) (another patient)  Visit Type Follow-up;Behavioral Health;Spiritual support;Social support  Spiritual Encounters  Spiritual Needs Emotional   Patient was in hallway and started a conversation with Chaplain. There was another patient present. Patient was wanting to know if he was really going home today as he asks daily. Patient stated that he had a visitor coming today and if things went well he would be leaving the unit and if they didn't he would be going to a shelter.

## 2016-06-24 NOTE — Progress Notes (Signed)
D:Pt denies SI/HI/AVH, but noted talking to internal stimuli. Pt is irritable, suspicious and angry with staff.Pt thoughts are disorganized, his speech is rapid and slurred, he appears anxious but he is not  interacting with peers and staff appropriately.  A:Pt was offered support and encouragement. Pt was given scheduled medications. Pt was encouraged to attend groups. Q 15 minute checks were done for safety.  R: Pt did not attend group. Pt is complaint with medication.Pt is not receptive to treatment and safety maintained on unit.

## 2016-06-25 LAB — BASIC METABOLIC PANEL
Anion gap: 6 (ref 5–15)
BUN: 14 mg/dL (ref 6–20)
CHLORIDE: 105 mmol/L (ref 101–111)
CO2: 26 mmol/L (ref 22–32)
CREATININE: 0.95 mg/dL (ref 0.61–1.24)
Calcium: 9.5 mg/dL (ref 8.9–10.3)
GFR calc Af Amer: >60 mL/min (ref >60)
GFR calc non Af Amer: >60 mL/min (ref >60)
GLUCOSE: 86 mg/dL (ref 65–99)
POTASSIUM: 3.9 mmol/L (ref 3.5–5.1)
Sodium: 137 mmol/L (ref 135–145)

## 2016-06-25 LAB — LITHIUM LEVEL: Lithium Lvl: 1.07 mmol/L (ref 0.60–1.20)

## 2016-06-25 MED ORDER — LITHIUM CARBONATE ER 450 MG PO TBCR
450.0000 mg | EXTENDED_RELEASE_TABLET | Freq: Two times a day (BID) | ORAL | Status: DC
  Administered 2016-06-25 – 2016-06-26 (×2): 450 mg via ORAL
  Filled 2016-06-25 (×2): qty 1

## 2016-06-25 MED ORDER — ARIPIPRAZOLE ER 400 MG IM SRER
400.0000 mg | INTRAMUSCULAR | Status: DC
  Administered 2016-06-25: 400 mg via INTRAMUSCULAR
  Filled 2016-06-25: qty 400

## 2016-06-25 MED ORDER — ARIPIPRAZOLE 10 MG PO TABS
20.0000 mg | ORAL_TABLET | Freq: Every day | ORAL | Status: DC
  Administered 2016-06-26: 20 mg via ORAL
  Filled 2016-06-25: qty 2

## 2016-06-25 NOTE — [facility] Group Notes (Signed)
   [facility]  Date/Time:06/25/2016 1PM  Type of Therapy/Topic:  Group Therapy:  Emotion Regulation  Participation Level:  Did Not Attend   Glennon MacSara P [place], LCSW 06/25/2016, 3:00 PM

## 2016-06-25 NOTE — Plan of Care (Signed)
Problem: Safety: Goal: Ability to remain free from injury will improve Outcome: Progressing Pt displays safe behaviors while in hospital commits to safety.    

## 2016-06-25 NOTE — Progress Notes (Signed)
Pt denies SI, HI, a/v hallucinations. He continues to be focused on discharge but his behavior is very redirectable. No aggressive or hostile behaviors noted. He is compliant with all medications and meals. Will continue to monitor safety.

## 2016-06-25 NOTE — Progress Notes (Signed)
CSW facilitated meeting between pt and [facility]ors and one other group home representative.  ONe identified issue: they would want pt to attend PSR day program and pt currently has ACT team.  Pt cannot have both services.  CSW Huntley DecSara spoke with them afterwards as well. Garner NashGregory Neidy Guerrieri, MSW, LCSW Clinical Social Worker 06/25/2016 10:20 AM

## 2016-06-25 NOTE — Social Work (Signed)
CSW contacted [employer]'s care coordinator supervisor, [name] [phone] to inquire about patient being able to utilize [facility] Team services [facility]. [facility] (group home director) is willing to accept patient if patient is able to attend PSR day program with other residents.  CSW left voicemail with [name] and is awaiting return call.  Hampton AbbotKadijah Meliya Mcconahy, MSW, LCSW-A 06/25/2016, 2:02PM

## 2016-06-25 NOTE — Tx Team (Signed)
Interdisciplinary Treatment and Diagnostic Plan Update  06/25/2016 Time of Session: 11:30am [name] MRN: [id number]  Principal Diagnosis: Schizoaffective disorder, bipolar type [facility])  Secondary Diagnoses: Principal Problem:   Schizoaffective disorder, bipolar type (HCC) Active Problems:   Noncompliance   Tobacco use disorder   Hypertension   Current Medications:  Current Facility-Administered Medications  Medication Dose Route Frequency Provider Last Rate Last Dose  . acetaminophen (TYLENOL) tablet 650 mg  650 mg Oral Q6H PRN Clapacs, John T, MD      . alum & mag hydroxide-simeth (MAALOX/MYLANTA) 200-200-20 MG/5ML suspension 30 mL  30 mL Oral Q4H PRN Clapacs, John T, MD      . amLODipine (NORVASC) tablet 5 mg  5 mg Oral Daily Clapacs, Jackquline Denmark, MD   5 mg at 06/25/16 0806  . [START ON 06/26/2016] ARIPiprazole (ABILIFY) tablet 20 mg  20 mg Oral Daily Jimmy Footman, MD      . ARIPiprazole ER SRER 400 mg  400 mg Intramuscular Q30 days Jimmy Footman, MD      . lithium carbonate (ESKALITH) CR tablet 450 mg  450 mg Oral Q12H Jimmy Footman, MD      . LORazepam (ATIVAN) tablet 1 mg  1 mg Oral Daily Jimmy Footman, MD   1 mg at 06/25/16 0806  . LORazepam (ATIVAN) tablet 2 mg  2 mg Oral QHS Jimmy Footman, MD   2 mg at 06/24/16 2208  . nicotine (NICODERM CQ - dosed in mg/24 hours) patch 21 mg  21 mg Transdermal Daily Jimmy Footman, MD      . risperiDONE (RISPERDAL M-TABS) disintegrating tablet 2 mg  2 mg Oral BID Clapacs, Jackquline Denmark, MD   2 mg at 06/25/16 0806  . risperiDONE microspheres (RISPERDAL CONSTA) injection 50 mg  50 mg Intramuscular Q14 Days Jimmy Footman, MD   50 mg at 06/17/16 1411   PTA Medications: Prescriptions Prior to Admission  Medication Sig Dispense Refill Last Dose  . acetaminophen (TYLENOL) 650 MG CR tablet Take 650 mg by mouth every 8 (eight) hours as needed for pain.   Past Week  at Unknown time  . alum & mag hydroxide-simeth (MAALOX/MYLANTA) 200-200-20 MG/5ML suspension Take 30 mLs by mouth every 6 (six) hours as needed for indigestion or heartburn.   Past Week at Unknown time  . amLODipine (NORVASC) 5 MG tablet Take 5 mg by mouth daily.   Past Week at Unknown time  . diphenhydrAMINE (BENADRYL) 50 MG tablet Take 50 mg by mouth at bedtime.   Past Week at Unknown time  . insulin aspart (NOVOLOG) 100 UNIT/ML injection Inject 2-12 Units into the skin 3 (three) times daily before meals. Sliding scale   Past Week at Unknown time  . magnesium hydroxide (MILK OF MAGNESIA) 400 MG/5ML suspension Take 30 mLs by mouth daily as needed for mild constipation.   Past Week at Unknown time  . risperiDONE (RISPERDAL) 1 MG tablet Take 1 mg by mouth 2 (two) times daily.    Past Week at Unknown time  . risperiDONE microspheres (RISPERDAL CONSTA) 50 MG injection Inject 50 mg into the muscle every 14 (fourteen) days.   Past Week at Unknown time  . [DISCONTINUED] risperiDONE (RISPERDAL) 3 MG tablet Take 3 mg by mouth at bedtime.   Past Week at Unknown time    Patient Stressors: Health problems Loss of living arrangement  Patient Strengths: General fund of knowledge Motivation for treatment/growth  Treatment Modalities: Medication Management, Group therapy, Case management,  1 to 1  session with clinician, Psychoeducation, Recreational therapy.   Physician Treatment Plan for Primary Diagnosis: Schizoaffective disorder, bipolar type (HCC) Long Term Goal(s): Improvement in symptoms so as ready for discharge NA   Short Term Goals: Ability to identify changes in lifestyle to reduce recurrence of condition will improve Ability to verbalize feelings will improve Ability to disclose and discuss suicidal ideas Ability to demonstrate self-control will improve Ability to identify and develop effective coping behaviors will improve Compliance with prescribed medications will improve Ability to  identify triggers associated with substance abuse/mental health issues will improve NA  Medication Management: Evaluate patient's response, side effects, and tolerance of medication regimen.  Therapeutic Interventions: 1 to 1 sessions, Unit Group sessions and Medication administration.  Evaluation of Outcomes: Progressing  Physician Treatment Plan for Secondary Diagnosis: Principal Problem:   Schizoaffective disorder, bipolar type (HCC) Active Problems:   Noncompliance   Tobacco use disorder   Hypertension  Long Term Goal(s): Improvement in symptoms so as ready for discharge NA   Short Term Goals: Ability to identify changes in lifestyle to reduce recurrence of condition will improve Ability to verbalize feelings will improve Ability to disclose and discuss suicidal ideas Ability to demonstrate self-control will improve Ability to identify and develop effective coping behaviors will improve Compliance with prescribed medications will improve Ability to identify triggers associated with substance abuse/mental health issues will improve NA     Medication Management: Evaluate patient's response, side effects, and tolerance of medication regimen.  Therapeutic Interventions: 1 to 1 sessions, Unit Group sessions and Medication administration.  Evaluation of Outcomes: Progressing   RN Treatment Plan for Primary Diagnosis: Schizoaffective disorder, bipolar type (HCC) Long Term Goal(s): Knowledge of disease and therapeutic regimen to maintain health will improve  Short Term Goals: Ability to demonstrate self-control, Ability to participate in decision making will improve, Ability to identify and develop effective coping behaviors will improve, Compliance with prescribed medications will improve and Clear thought process  Medication Management: RN will administer medications as ordered by provider, will assess and evaluate patient's response and provide education to patient for  prescribed medication. RN will report any adverse and/or side effects to prescribing provider.  Therapeutic Interventions: 1 on 1 counseling sessions, Psychoeducation, Medication administration, Evaluate responses to treatment, Monitor vital signs and CBGs as ordered, Perform/monitor CIWA, COWS, AIMS and Fall Risk screenings as ordered, Perform wound care treatments as ordered.  Evaluation of Outcomes: Progressing   LCSW Treatment Plan for Primary Diagnosis: Schizoaffective disorder, bipolar type (HCC) Long Term Goal(s): Safe transition to appropriate next level of care at discharge, Engage patient in therapeutic group addressing interpersonal concerns.  Short Term Goals: Engage patient in aftercare planning with referrals and resources and Increase ability to appropriately verbalize feelings  Therapeutic Interventions: Assess for all discharge needs, 1 to 1 time with Social worker, Explore available resources and support systems, Assess for adequacy in community support network, Educate family and significant other(s) on suicide prevention, Complete Psychosocial Assessment, Interpersonal group therapy.  Evaluation of Outcomes: Progressing   Progress in Treatment: Attending groups: Yes. Participating in groups: Yes. Taking medication as prescribed: Yes. Toleration medication: Yes. Family/Significant other contact made: No, will contact:  pt refused.  Patient understands diagnosis: No. Discussing patient identified problems/goals with staff: Yes. Medical problems stabilized or resolved: Yes. Denies suicidal/homicidal ideation: Yes. Issues/concerns per patient self-inventory: No. Other: n/a  New problem(s) identified: None identified at this time.   New Short Term/Long Term Goal(s): "I need to find a new group home".  Discharge Plan or Barriers: Patient will discharge to group home and follow-up with ACT team.   Group home willing to accept patient if patient can participate in day  program along with ACT Team services.  Reason for Continuation of Hospitalization: Delusions  Medication stabilization  Estimated Length of Stay: 7 to 10 days.   Attendees: Patient: Dustin LankWillie C Kenton 06/25/2016 2:27 PM  Physician: Dr. Radene JourneyAndrea HernandezJayme Cloud- Gonzalez, MD 06/25/2016 2:27 PM  Nursing:  06/25/2016 2:27 PM  RN Care Manager: 06/25/2016 2:27 PM  Social Worker: Hampton AbbotKadijah Amariss Detamore, MSW, LCSW-A 06/25/2016 2:27 PM  Recreational Therapist:  06/25/2016 2:27 PM  Other: Jeanann LewandowskyBethany Eaton, PA Student  06/25/2016 2:27 PM  Other:  06/25/2016 2:27 PM  Other: 06/25/2016 2:27 PM    Scribe for Treatment Team: Lynden OxfordKadijah R Morrill Bomkamp, LCSWA 06/25/2016 2:27 PM
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
## 2016-06-26 MED ORDER — LITHIUM CARBONATE ER 450 MG PO TBCR: 450.0000 mg | EXTENDED_RELEASE_TABLET | Freq: Two times a day (BID) | ORAL | 0 refills | Status: DC

## 2016-06-26 MED ORDER — ARIPIPRAZOLE ER 400 MG IM SRER: 400.0000 mg | INTRAMUSCULAR | 0 refills | Status: DC

## 2016-06-26 MED ORDER — ARIPIPRAZOLE 20 MG PO TABS: 20.0000 mg | ORAL_TABLET | Freq: Every day | ORAL | 0 refills | Status: DC

## 2016-06-26 NOTE — Social Work (Signed)
CSW spoke with [facility] ACT Team Lead, [place] CarboJanice Lee who confirmed that she spoke with [facility] care coordinator, Avanell Shackletononnie Wilkes - who is agreeable to fund services for ACTT as well as PSR. CSW made group home aware - able to pick patient up late this afternoon.  Hampton AbbotKadijah Izzy Doubek, MSW, LCSW-A 06/26/2016, 9:06AM

## 2016-06-26 NOTE — Social Work (Signed)
CS[facility] with [facility] Coord[facility] supervisor, [name] @ [phone] who stated that he is confident that after documentation provided by [facility] ACT Team Lead, Dustin CarboJanice [name] - patient will be able to keep ACTT services along with PSR day program that Dustin HumblesJean [name]'s Sales promotion account executive(director of group home) is requesting. CSW provided contact information to Dustin RobsonJean Majors in regards to situation at hand. Dustin RobsonJean Majors appreciative and stated that she will contact [facility] if need arises.  CSW is signing off.  Dustin AbbotKadijah Janille [name], MSW, LCSW-A 06/26/2016, 1:46PM

## 2016-06-26 NOTE — Progress Notes (Signed)
Denies SI/HI/AVH.  Affect bright.  States that he happy to be going home. Discharge instructions given, verbalized  Understanding.  Prescriptions given and personal belongings returned.  Escorted off unit to meet Birder RobsonJean Majors to travel to group home.

## 2016-06-26 NOTE — Progress Notes (Signed)
D: Pt denies SI/HI/AVH, but noted talking to internal stimuli. Pt is irritable, suspicious and angry with staff. Pt thoughts are disorganized, his  speech is rapid and slurred, he appears anxious but he is not  interacting with peers and staff appropriately.  A: Pt was offered support and encouragement. Pt was given scheduled medications. Pt was encouraged to attend groups. Q 15 minute checks were done for safety.  R: Pt did not attend group. Pt is complaint with medication.Pt is not receptive to treatment and safety maintained on unit.   

## 2016-06-26 NOTE — [facility] Group Notes (Signed)
[facility] LCSW Group Therapy Note  Type of Therapy and Topic:  Group Therapy:  Goals Group: SMART Goals  Participation Level:  Patient attended group but did not participate during group.   Description of Group:   The purpose of a daily goals group is to assist and guide patients in setting recovery/wellness-related goals.  The objective is to set goals as they relate to the crisis in which they were admitted. Patients will be using SMART goal modalities to set measurable goals.  Characteristics of realistic goals will be discussed and patients will be assisted in setting and processing how one will reach their goal. Facilitator will also assist patients in applying interventions and coping skills learned in psycho-education groups to the SMART goal and process how one will achieve defined goal.  Therapeutic Goals: -Patients will develop and document one goal related to or their crisis in which brought them into treatment. -Patients will be guided by LCSW using SMART goal setting modality in how to set a measurable, attainable, realistic and time sensitive goal.  -Patients will process barriers in reaching goal. -Patients will process interventions in how to overcome and successful in reaching goal.   Summary of Patient Progress:  Patient Goal:  None identified at this time.    Therapeutic Modalities:   Motivational Interviewing Engineer, manufacturing systemsCognitive Behavioral Therapy Crisis Intervention Model SMART goals setting  Sahith Nurse G. Garnette CzechSampson MSW, LCSWA 06/26/2016 11:04 AM
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
## 2016-06-26 NOTE — [facility] Suicide Risk Assessment (Signed)
[facility]   Principal Problem: Schizoaffective disorder, bipolar type [facility](HCC) Discharge Diagnoses:  Patient Active Problem List   Diagnosis Date Noted  . Schizoaffective disorder, bipolar type (HCC) [F25.0] 06/15/2016  . Hypertension [I10] 06/13/2016  . Tobacco use disorder [F17.200] 10/04/2015  . Noncompliance [Z91.19] 10/03/2015    Psychiatric Specialty Exam: ROS                                                         Mental Status Per Nursing Assessment::   On Admission:     Demographic Factors:  Male  Loss Factors: Decrease in vocational status  Historical Factors: Impulsivity  Risk Reduction Factors:   Living with another person, especially a relative and Positive social support  No access to guns  Continued Clinical Symptoms:  Schizophrenia:   Paranoid or undifferentiated type Previous Psychiatric Diagnoses and Treatments  Cognitive Features That Contribute To Risk:  Closed-mindedness and Loss of executive function    Suicide Risk:  Minimal: No identifiable suicidal ideation.  Patients presenting with no risk factors but with morbid ruminations; may be classified as minimal risk based on the severity of the depressive symptoms  Follow-up Information    [facility] [address] [facility] [facility] on [facility]/30/2016.   Why:  Please arrive to your appt with [facility] on June 25th at 11:30 AM for medication management with your psychiatrist. If you have questions, contact Liborio NixonJanice directly.  Contact information: [address] [place] [address] [phone]            Jimmy FootmanHernandez-Gonzalez,  Sargun Rummell, MD 06/26/2016, 1:19 PM

## 2016-06-26 NOTE — Progress Notes (Signed)
  [facility] :  Will you be returning to the same living situation after discharge:  No. At discharge, do you have transportation home?: Yes,  group home. Do you have the ability to pay for your medications: Yes,  Medicaid/Medicare  Release of information consent forms completed and in the chart;  Patient's signature needed at discharge.  Patient to Follow up at: Follow-up Information    [facility] [address] [facility] [place] [facility] Go on 06/30/2016.   Why:  Please arrive to your appt with Freder[facility] on June 25th at 11:30 AM for medication management with your psychiatrist. If you have questions, contact [facility] directly.  Contact information: [address] [place] [address] [phone]           Next level of care provider [facility] Link:no  Safety Planning and Suicide Prevention discussed: Yes,  SPE completed with patient and ACTT.  Have you used any form of tobacco in the last 30 days? (Cigarettes, Smokeless Tobacco, Cigars, and/or Pipes): Yes  Has patient been referred to the Quitline?: Patient refused referral  Patient has been referred for addiction treatment: N/A  Lynden OxfordKadijah R Jode Lippe, MSW, LCSW-A 06/26/2016, 3:01 PM

## 2016-06-27 ENCOUNTER — Other Ambulatory Visit: Payer: Self-pay | Admitting: Psychiatry

## 2016-06-27 DIAGNOSIS — R7611 Nonspecific reaction to tuberculin skin test without active tuberculosis: Secondary | ICD-10-CM

## 2016-06-27 DIAGNOSIS — F17200 Nicotine dependence, unspecified, uncomplicated: Secondary | ICD-10-CM

## 2016-06-30 ENCOUNTER — Ambulatory Visit
Admission: RE | Admit: 2016-06-30 | Discharge: 2016-06-30 | Disposition: A | Discharge status: Home / Self Care | Payer: Medicare Other | Source: Ambulatory Visit | Attending: Psychiatry | Admitting: Psychiatry

## 2016-06-30 DIAGNOSIS — R7611 Nonspecific reaction to tuberculin skin test without active tuberculosis: Secondary | ICD-10-CM

## 2016-06-30 DIAGNOSIS — R918 Other nonspecific abnormal finding of lung field: Secondary | ICD-10-CM | POA: Diagnosis not present

## 2016-06-30 DIAGNOSIS — I700 Atherosclerosis of aorta: Secondary | ICD-10-CM | POA: Diagnosis not present

## 2016-06-30 DIAGNOSIS — Z01818 Encounter for other preprocedural examination: Secondary | ICD-10-CM | POA: Diagnosis present
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
## 2021-05-02 ENCOUNTER — Encounter: Payer: Self-pay | Admitting: Psychiatry

## 2021-05-02 ENCOUNTER — Inpatient Hospital Stay
Admission: AD | Admit: 2021-05-02 | Discharge: 2021-05-10 | DRG: 885 | Disposition: A | Discharge status: Home / Self Care | Payer: Medicare Other | Source: Intra-hospital | Attending: Psychiatry | Admitting: Psychiatry

## 2021-05-02 ENCOUNTER — Encounter: Payer: Self-pay | Admitting: *Deleted

## 2021-05-02 ENCOUNTER — Other Ambulatory Visit: Payer: Self-pay

## 2021-05-02 ENCOUNTER — Emergency Department (EMERGENCY_DEPARTMENT_HOSPITAL)
Admission: EM | Admit: 2021-05-02 | Discharge: 2021-05-02 | Disposition: A | Discharge status: Other Acute Inpatient Hospital | Payer: Medicare Other | Source: Home / Self Care | Attending: Emergency Medicine | Admitting: Emergency Medicine

## 2021-05-02 DIAGNOSIS — Z888 Allergy status to other drugs, medicaments and biological substances status: Secondary | ICD-10-CM

## 2021-05-02 DIAGNOSIS — F250 Schizoaffective disorder, bipolar type: Secondary | ICD-10-CM | POA: Diagnosis present

## 2021-05-02 DIAGNOSIS — R4781 Slurred speech: Secondary | ICD-10-CM | POA: Diagnosis present

## 2021-05-02 DIAGNOSIS — E876 Hypokalemia: Secondary | ICD-10-CM | POA: Insufficient documentation

## 2021-05-02 DIAGNOSIS — F17210 Nicotine dependence, cigarettes, uncomplicated: Secondary | ICD-10-CM | POA: Diagnosis present

## 2021-05-02 DIAGNOSIS — I10 Essential (primary) hypertension: Secondary | ICD-10-CM | POA: Insufficient documentation

## 2021-05-02 DIAGNOSIS — R7989 Other specified abnormal findings of blood chemistry: Secondary | ICD-10-CM

## 2021-05-02 DIAGNOSIS — R7401 Elevation of levels of liver transaminase levels: Secondary | ICD-10-CM | POA: Insufficient documentation

## 2021-05-02 DIAGNOSIS — F259 Schizoaffective disorder, unspecified: Secondary | ICD-10-CM | POA: Diagnosis present

## 2021-05-02 DIAGNOSIS — Y900 Blood alcohol level of less than 20 mg/100 ml: Secondary | ICD-10-CM | POA: Insufficient documentation

## 2021-05-02 DIAGNOSIS — Z20822 Contact with and (suspected) exposure to covid-19: Secondary | ICD-10-CM | POA: Insufficient documentation

## 2021-05-02 DIAGNOSIS — Z79899 Other long term (current) drug therapy: Secondary | ICD-10-CM | POA: Diagnosis not present

## 2021-05-02 DIAGNOSIS — R4182 Altered mental status, unspecified: Secondary | ICD-10-CM | POA: Diagnosis not present

## 2021-05-02 DIAGNOSIS — Z91199 Patient's noncompliance with other medical treatment and regimen due to unspecified reason: Secondary | ICD-10-CM

## 2021-05-02 DIAGNOSIS — F209 Schizophrenia, unspecified: Secondary | ICD-10-CM

## 2021-05-02 DIAGNOSIS — F251 Schizoaffective disorder, depressive type: Secondary | ICD-10-CM | POA: Diagnosis not present

## 2021-05-02 DIAGNOSIS — R443 Hallucinations, unspecified: Secondary | ICD-10-CM

## 2021-05-02 LAB — CBC
HCT: 40.1 % (ref 39.0–52.0)
Hemoglobin: 12.8 g/dL — ABNORMAL LOW (ref 13.0–17.0)
MCH: 32.2 pg (ref 26.0–34.0)
MCHC: 31.9 g/dL (ref 30.0–36.0)
MCV: 100.8 fL — ABNORMAL HIGH (ref 80.0–100.0)
Platelets: 225 10*3/uL (ref 150–400)
RBC: 3.98 MIL/uL — ABNORMAL LOW (ref 4.22–5.81)
RDW: 12.3 % (ref 11.5–15.5)
WBC: 9.6 10*3/uL (ref 4.0–10.5)
nRBC: 0 % (ref 0.0–0.2)

## 2021-05-02 LAB — COMPREHENSIVE METABOLIC PANEL
ALT: 29 U/L (ref 0–44)
AST: 55 U/L — ABNORMAL HIGH (ref 15–41)
Albumin: 4 g/dL (ref 3.5–5.0)
Alkaline Phosphatase: 93 U/L (ref 38–126)
Anion gap: 11 (ref 5–15)
BUN: 12 mg/dL (ref 6–20)
CO2: 24 mmol/L (ref 22–32)
Calcium: 9.5 mg/dL (ref 8.9–10.3)
Chloride: 100 mmol/L (ref 98–111)
Creatinine, Ser: 1.14 mg/dL (ref 0.61–1.24)
GFR, Estimated: >60 mL/min (ref >60)
Glucose, Bld: 130 mg/dL — ABNORMAL HIGH (ref 70–99)
Potassium: 3 mmol/L — ABNORMAL LOW (ref 3.5–5.1)
Sodium: 135 mmol/L (ref 135–145)
Total Bilirubin: 1.3 mg/dL — ABNORMAL HIGH (ref 0.3–1.2)
Total Protein: 7.5 g/dL (ref 6.5–8.1)

## 2021-05-02 LAB — RESP PANEL BY RT-PCR (FLU A&B, COVID) ARPGX2
Influenza A by PCR: NEGATIVE
Influenza B by PCR: NEGATIVE
SARS Coronavirus 2 by RT PCR: NEGATIVE

## 2021-05-02 LAB — LITHIUM LEVEL
Lithium Lvl: 1.45 mmol/L — ABNORMAL HIGH (ref 0.60–1.20)
Lithium Lvl: 1.13 mmol/L (ref 0.60–1.20)

## 2021-05-02 LAB — ACETAMINOPHEN LEVEL: Acetaminophen (Tylenol), Serum: <10 ug/mL — ABNORMAL LOW (ref 10–30)

## 2021-05-02 LAB — ETHANOL: Alcohol, Ethyl (B): <10 mg/dL (ref <10)

## 2021-05-02 LAB — SALICYLATE LEVEL: Salicylate Lvl: <7 mg/dL — ABNORMAL LOW (ref 7.0–30.0)

## 2021-05-02 MED ORDER — ATORVASTATIN CALCIUM 20 MG PO TABS: 40.0000 mg | ORAL_TABLET | Freq: Every day | ORAL | Status: DC

## 2021-05-02 MED ORDER — SODIUM CHLORIDE 0.9 % IV BOLUS
1000.0000 mL | Freq: Once | INTRAVENOUS | Status: AC
  Administered 2021-05-02: 1000 mL via INTRAVENOUS

## 2021-05-02 MED ORDER — AMANTADINE HCL 100 MG PO CAPS
100.0000 mg | ORAL_CAPSULE | Freq: Two times a day (BID) | ORAL | Status: DC
  Filled 2021-05-02: qty 1

## 2021-05-02 MED ORDER — ATORVASTATIN CALCIUM 20 MG PO TABS
40.0000 mg | ORAL_TABLET | Freq: Every day | ORAL | Status: DC
  Administered 2021-05-02 – 2021-05-09 (×8): 40 mg via ORAL
  Filled 2021-05-02 (×8): qty 2

## 2021-05-02 MED ORDER — RISPERIDONE 1 MG PO TBDP
1.0000 mg | ORAL_TABLET | Freq: Two times a day (BID) | ORAL | Status: DC | PRN
  Administered 2021-05-03 – 2021-05-08 (×7): 1 mg via ORAL
  Filled 2021-05-02 (×7): qty 1

## 2021-05-02 MED ORDER — ACETAMINOPHEN 325 MG PO TABS
650.0000 mg | ORAL_TABLET | Freq: Four times a day (QID) | ORAL | Status: DC | PRN
  Administered 2021-05-04 – 2021-05-07 (×3): 650 mg via ORAL
  Filled 2021-05-02 (×3): qty 2

## 2021-05-02 MED ORDER — MONTELUKAST SODIUM 10 MG PO TABS
10.0000 mg | ORAL_TABLET | Freq: Every day | ORAL | Status: DC
  Administered 2021-05-02: 10 mg via ORAL
  Filled 2021-05-02: qty 1

## 2021-05-02 MED ORDER — AMLODIPINE BESYLATE 5 MG PO TABS: 5.0000 mg | ORAL_TABLET | Freq: Every day | ORAL | Status: DC

## 2021-05-02 MED ORDER — FLUTICASONE FUROATE-VILANTEROL 200-25 MCG/ACT IN AEPB
1.0000 | INHALATION_SPRAY | Freq: Every day | RESPIRATORY_TRACT | Status: DC
  Administered 2021-05-02 – 2021-05-10 (×7): 1 via RESPIRATORY_TRACT
  Filled 2021-05-02: qty 28

## 2021-05-02 MED ORDER — FLUTICASONE FUROATE-VILANTEROL 200-25 MCG/ACT IN AEPB
1.0000 | INHALATION_SPRAY | Freq: Every day | RESPIRATORY_TRACT | Status: DC
  Filled 2021-05-02: qty 28

## 2021-05-02 MED ORDER — AMOXICILLIN 250 MG PO CAPS
500.0000 mg | ORAL_CAPSULE | Freq: Three times a day (TID) | ORAL | Status: DC
  Administered 2021-05-02 – 2021-05-03 (×3): 500 mg via ORAL
  Filled 2021-05-02: qty 1
  Filled 2021-05-02: qty 2
  Filled 2021-05-02 (×2): qty 1
  Filled 2021-05-02 (×2): qty 2

## 2021-05-02 MED ORDER — RISPERIDONE 0.5 MG PO TBDP: 1.0000 mg | ORAL_TABLET | Freq: Two times a day (BID) | ORAL | Status: DC | PRN

## 2021-05-02 MED ORDER — LOSARTAN POTASSIUM 50 MG PO TABS
50.0000 mg | ORAL_TABLET | Freq: Every day | ORAL | Status: DC
  Administered 2021-05-03 – 2021-05-10 (×8): 50 mg via ORAL
  Filled 2021-05-02 (×8): qty 1

## 2021-05-02 MED ORDER — MAGNESIUM HYDROXIDE 400 MG/5ML PO SUSP: 30.0000 mL | Freq: Every day | ORAL | Status: DC | PRN

## 2021-05-02 MED ORDER — OLANZAPINE 10 MG PO TABS: 10.0000 mg | ORAL_TABLET | Freq: Once | ORAL | Status: DC

## 2021-05-02 MED ORDER — OLANZAPINE 10 MG PO TABS
10.0000 mg | ORAL_TABLET | Freq: Every day | ORAL | Status: DC
  Administered 2021-05-03 – 2021-05-05 (×3): 10 mg via ORAL
  Filled 2021-05-02 (×3): qty 1

## 2021-05-02 MED ORDER — MONTELUKAST SODIUM 10 MG PO TABS: 10.0000 mg | ORAL_TABLET | Freq: Every day | ORAL | Status: DC

## 2021-05-02 MED ORDER — LORAZEPAM 1 MG PO TABS
1.0000 mg | ORAL_TABLET | Freq: Once | ORAL | Status: AC
  Administered 2021-05-02: 1 mg via ORAL
  Filled 2021-05-02: qty 1

## 2021-05-02 MED ORDER — LITHIUM CARBONATE ER 450 MG PO TBCR: 450.0000 mg | EXTENDED_RELEASE_TABLET | Freq: Every morning | ORAL | Status: DC

## 2021-05-02 MED ORDER — OLANZAPINE 10 MG PO TABS
10.0000 mg | ORAL_TABLET | Freq: Every day | ORAL | Status: DC
Start: 2021-05-02 — End: 2021-05-02
  Administered 2021-05-02: 10 mg via ORAL
  Filled 2021-05-02: qty 1

## 2021-05-02 MED ORDER — ALUM & MAG HYDROXIDE-SIMETH 200-200-20 MG/5ML PO SUSP: 30.0000 mL | ORAL | Status: DC | PRN

## 2021-05-02 MED ORDER — LITHIUM CARBONATE ER 300 MG PO TBCR
300.0000 mg | EXTENDED_RELEASE_TABLET | Freq: Every day | ORAL | Status: DC
  Filled 2021-05-02: qty 1

## 2021-05-02 MED ORDER — AMLODIPINE BESYLATE 5 MG PO TABS
5.0000 mg | ORAL_TABLET | Freq: Every day | ORAL | Status: DC
  Administered 2021-05-03 – 2021-05-10 (×8): 5 mg via ORAL
  Filled 2021-05-02 (×8): qty 1

## 2021-05-02 MED ORDER — AMOXICILLIN 500 MG PO CAPS: 500.0000 mg | ORAL_CAPSULE | Freq: Three times a day (TID) | ORAL | Status: DC

## 2021-05-02 MED ORDER — AMANTADINE HCL 100 MG PO CAPS
100.0000 mg | ORAL_CAPSULE | Freq: Two times a day (BID) | ORAL | Status: DC
  Administered 2021-05-02 – 2021-05-03 (×2): 100 mg via ORAL
  Filled 2021-05-02 (×3): qty 1

## 2021-05-02 MED ORDER — LITHIUM CARBONATE ER 300 MG PO TBCR
300.0000 mg | EXTENDED_RELEASE_TABLET | Freq: Every day | ORAL | Status: DC
Start: 2021-05-02 — End: 2021-05-10
  Administered 2021-05-02 – 2021-05-09 (×8): 300 mg via ORAL
  Filled 2021-05-02 (×8): qty 1

## 2021-05-02 MED ORDER — LITHIUM CARBONATE ER 450 MG PO TBCR
450.0000 mg | EXTENDED_RELEASE_TABLET | Freq: Every morning | ORAL | Status: DC
  Administered 2021-05-03 – 2021-05-06 (×4): 450 mg via ORAL
  Filled 2021-05-02 (×4): qty 1

## 2021-05-02 MED ORDER — LOSARTAN POTASSIUM 50 MG PO TABS: 50.0000 mg | ORAL_TABLET | Freq: Every day | ORAL | Status: DC

## 2021-05-02 MED ORDER — POTASSIUM CHLORIDE CRYS ER 20 MEQ PO TBCR
40.0000 meq | EXTENDED_RELEASE_TABLET | Freq: Once | ORAL | Status: AC
  Administered 2021-05-02: 40 meq via ORAL
  Filled 2021-05-02: qty 2

## 2021-05-02 NOTE — ED Notes (Signed)
Sent red,green,and purple top tubes to lab. 

## 2021-05-02 NOTE — ED Provider Notes (Signed)
? ?[facility] ?Provider Note ? ? ? Event Date/Time  ? First MD Initiated Contact with Patient 05/02/21 1033   ?  (approximate) ? ? ?History  ? ?Chief Complaint ?Altered Mental Status ? ? ?HPI ? ?[name] is a 61 y.o. male with past medical history of hypertension and schizophrenia who presents to the ED for altered mental status.  History is limited due to patient's disorientation and change from his baseline, majority of history is obtained from his Child psychotherapist at bedside.  He states that the patient has not been acting like himself for the past week or 2, which he attributes to his olanzapine being stopped on March 29.  He reports that the patient has been very agitated and restless, pacing through the halls at night and seeming to hear voices.  He has not been aggressive and has not voiced any thoughts of harming himself or others.  Social worker is also concerned that his risperidone was stopped around the same time, this was restarted on a as needed basis a couple of days ago with slight improvement in patient's condition.  Patient currently denies any complaints and social worker denies any recent fevers, cough, chest pain, shortness of breath, nausea, vomiting, or dysuria. ?  ? ? ?Physical Exam  ? ?Triage Vital Signs: ?ED Triage Vitals  ?Enc Vitals Group  ?   BP 05/02/21 1003 (!) 182/71  ?   Pulse Rate 05/02/21 1003 (!) 102  ?   Resp 05/02/21 1003 17  ?   Temp 05/02/21 1003 98.1 ?F (36.7 ?C)  ?   Temp src --   ?   SpO2 05/02/21 1003 98 %  ?   Weight 05/02/21 1015 150 lb (68 kg)  ?   Height 05/02/21 1015 5\' 6"  (1.676 m)  ?   Head Circumference --   ?   Peak Flow --   ?   Pain Score 05/02/21 1013 0  ?   Pain Loc --   ?   Pain Edu? --   ?   Excl. in GC? --   ? ? ?Most recent vital signs: ?Vitals:  ? 05/02/21 1003  ?BP: (!) 182/71  ?Pulse: (!) 102  ?Resp: 17  ?Temp: 98.1 ?F (36.7 ?C)  ?SpO2: 98%  ? ? ?Constitutional: Alert and oriented. ?Eyes: Conjunctivae are normal. ?Head:  Atraumatic. ?Nose: No congestion/rhinnorhea. ?Mouth/Throat: Mucous membranes are moist.  ?Cardiovascular: Normal rate, regular rhythm. Grossly normal heart sounds.  2+ radial pulses bilaterally. ?Respiratory: Normal respiratory effort.  No retractions. Lungs CTAB. ?Gastrointestinal: Soft and nontender. No distention. ?Musculoskeletal: No lower extremity tenderness nor edema.  ?Neurologic:  Normal speech and language. No gross focal neurologic deficits are appreciated. ? ? ? ?ED Results / Procedures / Treatments  ? ?Labs ?(all labs ordered are listed, but only abnormal results are displayed) ?Labs Reviewed  ?COMPREHENSIVE METABOLIC PANEL - Abnormal; Notable for the following components:  ?    Result Value  ? Potassium 3.0 (*)   ? Glucose, Bld 130 (*)   ? AST 55 (*)   ? Total Bilirubin 1.3 (*)   ? All other components within normal limits  ?SALICYLATE LEVEL - Abnormal; Notable for the following components:  ? Salicylate Lvl <7.0 (*)   ? All other components within normal limits  ?ACETAMINOPHEN LEVEL - Abnormal; Notable for the following components:  ? Acetaminophen (Tylenol), Serum <10 (*)   ? All other components within normal limits  ?CBC - Abnormal; Notable for  the following components:  ? RBC 3.98 (*)   ? Hemoglobin 12.8 (*)   ? MCV 100.8 (*)   ? All other components within normal limits  ?LITHIUM LEVEL - Abnormal; Notable for the following components:  ? Lithium Lvl 1.45 (*)   ? All other components within normal limits  ?ETHANOL  ?URINE DRUG SCREEN, QUALITATIVE ([facility] ONLY)  ?LITHIUM LEVEL  ? ? ?PROCEDURES: ? ?Critical Care performed: No ? ?Procedures ? ? ?MEDICATIONS ORDERED IN ED: ?Medications  ?OLANZapine (ZYPREXA) tablet 10 mg (10 mg Oral Given 05/02/21 1224)  ?potassium chloride SA (KLOR-CON M) CR tablet 40 mEq (40 mEq Oral Given 05/02/21 1224)  ?sodium chloride 0.9 % bolus 1,000 mL (0 mLs Intravenous Stopped 05/02/21 1415)  ?LORazepam (ATIVAN) tablet 1 mg (1 mg Oral Given 05/02/21 1422)  ? ? ? ?IMPRESSION / MDM  / ASSESSMENT AND PLAN / ED COURSE  ?I reviewed the triage vital signs and the nursing notes. ?             ?               ? ?61 y.o. male with past medical history of hypertension and schizophrenia who presents to the ED for change in mental status with increased restlessness and hallucinations for the past 1 to 2 weeks after recent change in his medications. ? ?Differential diagnosis includes, but is not limited to, hallucinations, psychosis, electrolyte abnormality, lithium toxicity. ? ?Patient well-appearing and in no acute distress, vital signs are unremarkable and he has no focal neurologic deficits on exam.  Hallucinations and restlessness seem likely to be related to recent medication changes, no findings to suggest intracranial process.  Labs are reassuring with BMP remarkable only for mild hypokalemia, CBC without significant anemia or leukocytosis, LFTs show only mild elevation in AST similar to previous.  Plan to consult psychiatry for medication recommendations, but patient does not appear to represent a threat to himself or others at this time. ? ?Lithium level noted to be elevated to 1.45, patient currently without any clinical signs of lithium toxicity. We will hydrate with IVF and observe for 4-6 hours, recheck lithium level following fluid bolus. If level is downtrending, patient may be medically cleared for psychiatric disposition. Patient was evaluated by psychiatry, who recommends restarting zyprexa and observing patient in the ED for improvement. Patient turned over to oncoming provider pending repeat lithium level and further psychiatric evaluation. ? ?  ? ? ?FINAL CLINICAL IMPRESSION(S) / ED DIAGNOSES  ? ?Final diagnoses:  ?Hallucinations  ?Schizophrenia, unspecified type (HCC)  ?Elevated lithium level  ? ? ? ?Rx / DC Orders  ? ?ED Discharge Orders   ? ? None  ? ?  ? ? ? ?Note:  This document was prepared using Dragon voice recognition software and may include unintentional dictation  errors. ?  ?Chesley Noon, MD ?05/02/21 1508 ? ?

## 2021-05-02 NOTE — Plan of Care (Signed)
?Problem: Education: ?Goal: Knowledge of General Education information will improve ?Description: Including pain rating scale, medication(s)/side effects and non-pharmacologic comfort measures ?05/02/2021 2350 by Thressa Sheller, RN ?Outcome: Progressing ?05/02/2021 2348 by Thressa Sheller, RN ?Outcome: Not Progressing ?05/02/2021 2347 by Thressa Sheller, RN ?Outcome: Not Progressing ?  ?Problem: Health Behavior/Discharge Planning: ?Goal: Ability to manage health-related needs will improve ?05/02/2021 2350 by Thressa Sheller, RN ?Outcome: Progressing ?05/02/2021 2348 by Thressa Sheller, RN ?Outcome: Not Progressing ?05/02/2021 2347 by Thressa Sheller, RN ?Outcome: Not Progressing ?  ?Problem: Clinical Measurements: ?Goal: Ability to maintain clinical measurements within normal limits will improve ?05/02/2021 2350 by Thressa Sheller, RN ?Outcome: Progressing ?05/02/2021 2348 by Thressa Sheller, RN ?Outcome: Not Progressing ?05/02/2021 2347 by Thressa Sheller, RN ?Outcome: Not Progressing ?Goal: Will remain free from infection ?05/02/2021 2350 by Thressa Sheller, RN ?Outcome: Progressing ?05/02/2021 2348 by Thressa Sheller, RN ?Outcome: Not Progressing ?05/02/2021 2347 by Thressa Sheller, RN ?Outcome: Not Progressing ?Goal: Diagnostic test results will improve ?05/02/2021 2350 by Thressa Sheller, RN ?Outcome: Progressing ?05/02/2021 2348 by Thressa Sheller, RN ?Outcome: Not Progressing ?05/02/2021 2347 by Thressa Sheller, RN ?Outcome: Not Progressing ?Goal: Respiratory complications will improve ?05/02/2021 2350 by Thressa Sheller, RN ?Outcome: Progressing ?05/02/2021 2348 by Thressa Sheller, RN ?Outcome: Not Progressing ?05/02/2021 2347 by Thressa Sheller, RN ?Outcome: Not Progressing ?Goal: Cardiovascular complication will be avoided ?05/02/2021 2350 by Thressa Sheller, RN ?Outcome:  Progressing ?05/02/2021 2348 by Thressa Sheller, RN ?Outcome: Not Progressing ?05/02/2021 2347 by Thressa Sheller, RN ?Outcome: Not Progressing ?  ?Problem: Activity: ?Goal: Risk for activity intolerance will decrease ?05/02/2021 2350 by Thressa Sheller, RN ?Outcome: Progressing ?05/02/2021 2348 by Thressa Sheller, RN ?Outcome: Not Progressing ?05/02/2021 2347 by Thressa Sheller, RN ?Outcome: Not Progressing ?  ?Problem: Nutrition: ?Goal: Adequate nutrition will be maintained ?05/02/2021 2350 by Thressa Sheller, RN ?Outcome: Progressing ?05/02/2021 2348 by Thressa Sheller, RN ?Outcome: Not Progressing ?05/02/2021 2347 by Thressa Sheller, RN ?Outcome: Not Progressing ?  ?Problem: Coping: ?Goal: Level of anxiety will decrease ?05/02/2021 2350 by Thressa Sheller, RN ?Outcome: Progressing ?05/02/2021 2348 by Thressa Sheller, RN ?Outcome: Not Progressing ?05/02/2021 2347 by Thressa Sheller, RN ?Outcome: Not Progressing ?  ?Problem: Elimination: ?Goal: Will not experience complications related to bowel motility ?05/02/2021 2350 by Thressa Sheller, RN ?Outcome: Progressing ?05/02/2021 2348 by Thressa Sheller, RN ?Outcome: Not Progressing ?05/02/2021 2347 by Thressa Sheller, RN ?Outcome: Not Progressing ?Goal: Will not experience complications related to urinary retention ?05/02/2021 2350 by Thressa Sheller, RN ?Outcome: Progressing ?05/02/2021 2348 by Thressa Sheller, RN ?Outcome: Not Progressing ?05/02/2021 2347 by Thressa Sheller, RN ?Outcome: Not Progressing ?  ?Problem: Pain Managment: ?Goal: General experience of comfort will improve ?05/02/2021 2350 by Thressa Sheller, RN ?Outcome: Progressing ?05/02/2021 2348 by Thressa Sheller, RN ?Outcome: Not Progressing ?05/02/2021 2347 by Thressa Sheller, RN ?Outcome: Not Progressing ?  ?Problem: Safety: ?Goal: Ability to remain free from  injury will improve ?05/02/2021 2350 by Thressa Sheller, RN ?Outcome: Progressing ?05/02/2021 2348 by Thressa Sheller, RN ?Outcome: Not Progressing ?05/02/2021 2347 by Thressa Sheller, RN ?Outcome: Not Progressing ?  ?Problem: Skin Integrity: ?Goal: Risk for impaired skin integrity will decrease ?05/02/2021 2350 by Thressa Sheller, RN ?Outcome: Progressing ?05/02/2021 2348 by Thressa Sheller, RN ?Outcome: Not Progressing ?05/02/2021 2347 by Thressa Sheller, RN ?Outcome:  Not Progressing ?  ?Problem: Activity: ?Goal: Will verbalize the importance of balancing activity with adequate rest periods ?05/02/2021 2350 by Thressa Sheller, RN ?Outcome: Progressing ?05/02/2021 2348 by Thressa Sheller, RN ?Outcome: Not Progressing ?05/02/2021 2347 by Thressa Sheller, RN ?Outcome: Not Progressing ?  ?Problem: Education: ?Goal: Will be free of psychotic symptoms ?05/02/2021 2350 by Thressa Sheller, RN ?Outcome: Progressing ?05/02/2021 2348 by Thressa Sheller, RN ?Outcome: Not Progressing ?05/02/2021 2347 by Thressa Sheller, RN ?Outcome: Not Progressing ?Goal: Knowledge of the prescribed therapeutic regimen will improve ?05/02/2021 2350 by Thressa Sheller, RN ?Outcome: Progressing ?05/02/2021 2348 by Thressa Sheller, RN ?Outcome: Not Progressing ?05/02/2021 2347 by Thressa Sheller, RN ?Outcome: Not Progressing ?  ?Problem: Coping: ?Goal: Coping ability will improve ?05/02/2021 2350 by Thressa Sheller, RN ?Outcome: Progressing ?05/02/2021 2348 by Thressa Sheller, RN ?Outcome: Not Progressing ?05/02/2021 2347 by Thressa Sheller, RN ?Outcome: Not Progressing ?Goal: Will verbalize feelings ?05/02/2021 2350 by Thressa Sheller, RN ?Outcome: Progressing ?05/02/2021 2348 by Thressa Sheller, RN ?Outcome: Not Progressing ?05/02/2021 2347 by Thressa Sheller, RN ?Outcome: Not Progressing ?   ?Problem: Health Behavior/Discharge Planning: ?Goal: Compliance with prescribed medication regimen will improve ?05/02/2021 2350 by Thressa Sheller, RN ?Outcome: Progressing ?05/02/2021 2348 by Thressa Sheller, RN ?Outcome: Not Progressing ?05/02/2021 2347 by Thressa Sheller, RN ?Outcome: Not Progressing ?  ?Problem: Nutritional: ?Goal: Ability to achieve adequate nutritional intake will improve ?05/02/2021 2350 by Thressa Sheller, RN ?Outcome: Progressing ?05/02/2021 2348 by Thressa Sheller, RN ?Outcome: Not Progressing ?05/02/2021 2347 by Thressa Sheller, RN ?Outcome: Not Progressing ?  ?Problem: Role Relationship: ?Goal: Ability to communicate needs accurately will improve ?05/02/2021 2350 by Thressa Sheller, RN ?Outcome: Progressing ?05/02/2021 2348 by Thressa Sheller, RN ?Outcome: Not Progressing ?05/02/2021 2347 by Thressa Sheller, RN ?Outcome: Not Progressing ?Goal: Ability to interact with others will improve ?05/02/2021 2350 by Thressa Sheller, RN ?Outcome: Progressing ?05/02/2021 2348 by Thressa Sheller, RN ?Outcome: Not Progressing ?05/02/2021 2347 by Thressa Sheller, RN ?Outcome: Not Progressing ?  ?Problem: Safety: ?Goal: Ability to redirect hostility and anger into socially appropriate behaviors will improve ?05/02/2021 2350 by Thressa Sheller, RN ?Outcome: Progressing ?05/02/2021 2348 by Thressa Sheller, RN ?Outcome: Not Progressing ?05/02/2021 2347 by Thressa Sheller, RN ?Outcome: Not Progressing ?Goal: Ability to remain free from injury will improve ?05/02/2021 2350 by Thressa Sheller, RN ?Outcome: Progressing ?05/02/2021 2348 by Thressa Sheller, RN ?Outcome: Not Progressing ?05/02/2021 2347 by Thressa Sheller, RN ?Outcome: Not Progressing ?  ?Problem: Self-Care: ?Goal: Ability to participate in self-care as condition permits will improve ?05/02/2021 2350 by Thressa Sheller, RN ?Outcome: Progressing ?05/02/2021 2348 by Thressa Sheller, RN ?Outcome: Not Progressing ?05/02/2021 2347 by Thressa Sheller, RN ?Outcome: Not Progressing ?  ?Problem: Self-Concept: ?Goal

## 2021-05-02 NOTE — Plan of Care (Signed)
?  Problem: Education: Goal: Knowledge of General Education information will improve Description: Including pain rating scale, medication(s)/side effects and non-pharmacologic comfort measures Outcome: Not Progressing   Problem: Health Behavior/Discharge Planning: Goal: Ability to manage health-related needs will improve Outcome: Not Progressing   Problem: Clinical Measurements: Goal: Ability to maintain clinical measurements within normal limits will improve Outcome: Not Progressing Goal: Will remain free from infection Outcome: Not Progressing Goal: Diagnostic test results will improve Outcome: Not Progressing Goal: Respiratory complications will improve Outcome: Not Progressing Goal: Cardiovascular complication will be avoided Outcome: Not Progressing   Problem: Activity: Goal: Risk for activity intolerance will decrease Outcome: Not Progressing   Problem: Nutrition: Goal: Adequate nutrition will be maintained Outcome: Not Progressing   Problem: Coping: Goal: Level of anxiety will decrease Outcome: Not Progressing   Problem: Elimination: Goal: Will not experience complications related to bowel motility Outcome: Not Progressing Goal: Will not experience complications related to urinary retention Outcome: Not Progressing   Problem: Pain Managment: Goal: General experience of comfort will improve Outcome: Not Progressing   Problem: Safety: Goal: Ability to remain free from injury will improve Outcome: Not Progressing   Problem: Skin Integrity: Goal: Risk for impaired skin integrity will decrease Outcome: Not Progressing   Problem: Activity: Goal: Will verbalize the importance of balancing activity with adequate rest periods Outcome: Not Progressing   Problem: Education: Goal: Will be free of psychotic symptoms Outcome: Not Progressing Goal: Knowledge of the prescribed therapeutic regimen will improve Outcome: Not Progressing   Problem: Coping: Goal: Coping  ability will improve Outcome: Not Progressing Goal: Will verbalize feelings Outcome: Not Progressing   Problem: Health Behavior/Discharge Planning: Goal: Compliance with prescribed medication regimen will improve Outcome: Not Progressing   Problem: Nutritional: Goal: Ability to achieve adequate nutritional intake will improve Outcome: Not Progressing   Problem: Role Relationship: Goal: Ability to communicate needs accurately will improve Outcome: Not Progressing Goal: Ability to interact with others will improve Outcome: Not Progressing   Problem: Safety: Goal: Ability to redirect hostility and anger into socially appropriate behaviors will improve Outcome: Not Progressing Goal: Ability to remain free from injury will improve Outcome: Not Progressing   Problem: Self-Care: Goal: Ability to participate in self-care as condition permits will improve Outcome: Not Progressing   Problem: Self-Concept: Goal: Will verbalize positive feelings about self Outcome: Not Progressing   

## 2021-05-02 NOTE — ED Notes (Signed)
Hospital meal provided, pt tolerated w/o complaints.  Waste discarded appropriately.  Patient is sleeping at this time. ?

## 2021-05-02 NOTE — ED Notes (Signed)
VOL/pending admit for medication management ?

## 2021-05-02 NOTE — BH Assessment (Signed)
Comprehensive Clinical Assessment (CCA) Note ? ?05/02/2021 ?Dustin [name] ?[id number] ? ?Chief Complaint:  ?Chief Complaint  ?Patient presents with  ? Altered Mental Status  ? ?Visit Diagnosis: Schizophrenia ? ?Dustin C. Clovis RileyMitchell is a 61 year old male who presents to the ER via his day program worker [name](Byron W.). Per the worker, his provider changed his medications and he started to regressed. This takes place approximately every two years. The worker noticed the change last Monday, when he was asking to call his parents. This morning, he was at the program, disoriented, confused and delusional. The worker, spoke with his provider and explained the change in his behaviors but they are waiting on labs to restart the medications. They stated they will not be able to see him no sooner than this next Friday. The staff members shared he could not wait that long and that is why he brought him to the ER. He had a list of his present and past medications. He further reports, once he take the Zyprexa, he usually clears up within 45 minutes till an hour. ? ?During the interview, the patient was calm, attempted to cooperate but he was unorganized, confused and having a flight of ideas. ? ?CCA Screening, Triage and Referral (STR) ? ?Patient Reported Information ?How did you hear about us? Family/Friend ? ?What Is the Reason for Your Visit/Call Today? Brought to ER due to regression becasue of changes in his medications. ? ?How Long Has This Been Causing You Problems? <Week ? ?What Do You Feel Would Help You the Most Today? Treatment for Depression or other mood problem ? ? ?Have You Recently Had Any Thoughts About Hurting Yourself? No ? ?Are You Planning to Commit Suicide/Harm Yourself At This time? No ? ? ?Have you Recently Had Thoughts About Hurting Someone Dustin Ohslse? No ? ?Are You Planning to Harm Someone at This Time? No ? ?Explanation: No data recorded ? ?Have You Used Any Alcohol or Drugs in the Past 24 Hours? No ? ?How Long  Ago Did You Use Drugs or Alcohol? No data recorded ?What Did You Use and How Much? No data recorded ? ?Do You Currently Have a Therapist/Psychiatrist? Yes ? ?Name of Therapist/Psychiatrist: Dr. Omelia BlackwaterHeaden ? ? ?Have You Been Recently Discharged From Any Office Practice or Programs? No ? ?Explanation of Discharge From Practice/Program: No data recorded ? ?  ?CCA Screening Triage Referral Assessment ?Type of Contact: Face-to-Face ? ?Telemedicine Service Delivery:   ?Is this Initial or Reassessment? No data recorded ?Date Telepsych consult ordered in CHL:  No data recorded ?Time Telepsych consult ordered in CHL:  No data recorded ?[facility] ? ?[facility] ? ? ?Collateral Involvement: Patient's Day Program Worker ? ? ?Does Patient Have a Automotive engineerCourt Appointed Legal Guardian? No data recorded ?Name and Contact of Legal Guardian: No data recorded ?If Minor and Not Living with Parent(s), Who has Custody? No data recorded ?Is CPS involved or ever been involved? Never ? ?Is APS involved or ever been involved? Never ? ? ?Patient Determined To Be At Risk for Harm To Self or Others Based on Review of Patient Reported Information or Presenting Complaint? No ? ?Method: No data recorded ?Availability of Means: No data recorded ?Intent: No data recorded ?Notification Required: No data recorded ?Additional Information for Danger to Others Potential: No data recorded ?Additional Comments for Danger to Others Potential: No data recorded ?Are There Guns or Other Weapons in Your Home? No data recorded ?Types of Guns/Weapons: No data  recorded ?Are These Weapons Safely Secured?                            No data recorded ?Who Could Verify You Are Able To Have These Secured: No data recorded ?Do You Have any Outstanding Charges, Pending Court Dates, Parole/Probation? No data recorded ?Contacted To Inform of Risk of Harm To Self or Others: No data recorded ? ? ?Does Patient Present under Involuntary Commitment?  No ? ?IVC Papers Initial File Date: No data recorded ? ?[place] of Residence: [place] ? ? ?Patient Currently Receiving the Following Services: Medication Management; Day Treatment ? ? ?Determination of Need: Emergent (2 hours) ? ? ?Options For Referral: ED Visit ? ? ? ? ?CCA Biopsychosocial ?Patient Reported Schizophrenia/Schizoaffective Diagnosis in Past: Yes ? ? ?Strengths: Have a support system, some insight and have care team. ? ? ?Mental Health Symptoms ?Depression:   ?None ?  ?Duration of Depressive symptoms:    ?Mania:   ?Racing thoughts ?  ?Anxiety:    ?Restlessness ?  ?Psychosis:   ?Delusions ?  ?Duration of Psychotic symptoms:  ?Duration of Psychotic Symptoms: Greater than six months ?  ?Trauma:   ?None ?  ?Obsessions:   ?N/A ?  ?Compulsions:   ?N/A ?  ?Inattention:   ?N/A ?  ?Hyperactivity/Impulsivity:   ?Fidgets with hands/feet ?  ?Oppositional/Defiant Behaviors:   ?N/A ?  ?Emotional Irregularity:   ?N/A ?  ?Other Mood/Personality Symptoms:  No data recorded  ? ?Mental Status Exam ?Appearance and self-care  ?Stature:   ?Average ?  ?Weight:   ?Average weight ?  ?Clothing:  No data recorded  ?Grooming:   ?Normal ?  ?Cosmetic use:   ?None ?  ?Posture/gait:   ?Normal ?  ?Motor activity:   ?-- (Within normal range) ?  ?Sensorium  ?Attention:   ?Distractible ?  ?Concentration:   ?Preoccupied; Scattered ?  ?Orientation:   ?Person ?  ?Recall/memory:   ?Defective in Immediate; Defective in Short-term; Defective in Recent; Defective in Remote ?  ?Affect and Mood  ?Affect:   ?Anxious ?  ?Mood:   ?Anxious ?  ?Relating  ?Eye contact:   ?Normal ?  ?Facial expression:   ?Anxious; Sad ?  ?Attitude toward examiner:   ?Cooperative ?  ?Thought and Language  ?Speech flow:  ?Flight of Ideas; Articulation error ?  ?Thought content:   ?Delusions ?  ?Preoccupation:   ?None ?  ?Hallucinations:   ?Auditory ?  ?Organization:  No data recorded  ?Executive Functions  ?Fund of Knowledge:   ?Average ?  ?Intelligence:   ?Average ?   ?Abstraction:   ?Normal ?  ?Judgement:   ?Fair ?  ?Reality Testing:   ?Distorted ?  ?Insight:   ?Poor ?  ?Decision Making:   ?Impulsive ?  ?Social Functioning  ?Social Maturity:   ?Isolates; Impulsive ?  ?Social Judgement:   ?Heedless ?  ?Stress  ?Stressors:   ?-- (Medication change) ?  ?Coping Ability:   ?Normal ?  ?Skill Deficits:   ?Decision making; Communication ?  ?Supports:   ?Other (Comment) ?  ? ? ?Religion: ?Religion/Spirituality ?Are You A Religious Person?: No ? ?Leisure/Recreation: ?Leisure / Recreation ?Do You Have Hobbies?: No ? ?Exercise/Diet: ?Exercise/Diet ?Do You Exercise?: No ?Have You Gained or Lost A Significant Amount of Weight in the Past Six Months?: No ?Do You Follow a Special Diet?: No ?Do You Have Any Trouble Sleeping?: No ? ? ?CCA Employment/Education ?Employment/Work Situation: ?Employment /  Work Situation ?Employment Situation: On disability ?Why is Patient on Disability: Mental Health ?How Long has Patient Been on Disability: Unable to quantify ?Patient's Job has Been Impacted by Current Illness: No ?Has Patient ever Been in the Military?: No ? ?Education: ?Education ?Is Patient Currently Attending School?: No ?Did You Attend College?: No ?Did You Have An Individualized Education Program (IIEP): No ?Did You Have Any Difficulty At School?: No ?Patient's Education Has Been Impacted by Current Illness: No ? ? ?CCA Family/Childhood History ?Family and Relationship History: ?Family history ?Marital status: Single ?Does patient have children?: No ? ?Childhood History:  ?Childhood History ?By whom was/is the patient raised?: Mother ?Did patient suffer any verbal/emotional/physical/sexual abuse as a child?: No ?Did patient suffer from severe childhood neglect?: No ?Has patient ever been sexually abused/assaulted/raped as an adolescent or adult?: No ?Witnessed domestic violence?: No ?Has patient been affected by domestic violence as an adult?: No ? ?Child/Adolescent Assessment: ?  ? ? ?CCA  Substance Use ?Alcohol/Drug Use: ?Alcohol / Drug Use ?Pain Medications: See PTA ?Prescriptions: See PTA ?Over the Counter: See PTA ?History of alcohol / drug use?: No history of alcohol / drug abuse ? ? ?ASAM's
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
## 2021-05-02 NOTE — ED Provider Notes (Signed)
----------------------------------------- ?  7:34 PM on 05/02/2021 ?----------------------------------------- ? ?I took over care on this patient from Dr. Larinda Buttery.  Repeat lithium level after fluids is within a normal range.  The patient is medically cleared at this time.  He is pending psychiatry disposition recommendations. ?  Dionne Bucy, MD ?05/02/21 1934 ? ?

## 2021-05-02 NOTE — BH Assessment (Addendum)
Patient is to be admitted to [facility] by Psychiatric Nurse Practitioner  Sharon Mt .  ?Attending Physician will be Dr.  Weber Cooks .   ?Patient has been assigned to room 319, by [facility] Charge Nurse Demetria.   ?Intake Paper Work has been signed and placed on patient chart.  ?ER staff is aware of the admission: ?Hirra, ER Secretary   ?Dr. Charlynn Court, ER MD  ?Gerald Stabs, Patient's Nurse  ?Helene Kelp, Patient Access.  ? ? ?

## 2021-05-02 NOTE — ED Notes (Signed)
Called [name] to come sit with pt as pt has become increasingly agitated and unable to be redirected ?

## 2021-05-02 NOTE — ED Notes (Signed)
Attempted IV access x2, no success.

## 2021-05-02 NOTE — ED Notes (Signed)
Pt continues to have auditory hallucinations, trying to get out of bed, and saying he is ready to go home- pt is compliant when told to lay down, but then immediately gets back up- pt pulled out his IV and was noted to have peed in the bed- linens were changed by Mel and Caitlyn, EDTs- pt was changed into blue paper scrubs- told Dr Charlann Lange and Dr Larinda Buttery of pt's behavior ?

## 2021-05-02 NOTE — ED Notes (Signed)
Report received from Ariel, RN. Patient currently sleeping, respirations regular and unlabored. Q15 minute rounds and observation by Rover and Officer to continue. Will assess patient once awake.  ?

## 2021-05-02 NOTE — ED Notes (Signed)
Pt ambulates to restroom to provide urine sample. Pt does not give sample, leaves empty cup on sink. Report has been called to Phillis, RN in BMU,. Pt to be admitted  ?

## 2021-05-02 NOTE — ED Notes (Signed)
[name] given update on pt status and that pt will need admission to get him back on his meds and back to baseline- Mr [name] agreeable to plan ?

## 2021-05-02 NOTE — Plan of Care (Signed)
?Problem: Education: ?Goal: Knowledge of General Education information will improve ?Description: Including pain rating scale, medication(s)/side effects and non-pharmacologic comfort measures ?05/02/2021 2348 by Ardelle Anton, RN ?Outcome: Not Progressing ?05/02/2021 2347 by Ardelle Anton, RN ?Outcome: Not Progressing ?  ?Problem: Health Behavior/Discharge Planning: ?Goal: Ability to manage health-related needs will improve ?05/02/2021 2348 by Ardelle Anton, RN ?Outcome: Not Progressing ?05/02/2021 2347 by Ardelle Anton, RN ?Outcome: Not Progressing ?  ?Problem: Clinical Measurements: ?Goal: Ability to maintain clinical measurements within normal limits will improve ?05/02/2021 2348 by Ardelle Anton, RN ?Outcome: Not Progressing ?05/02/2021 2347 by Ardelle Anton, RN ?Outcome: Not Progressing ?Goal: Will remain free from infection ?05/02/2021 2348 by Ardelle Anton, RN ?Outcome: Not Progressing ?05/02/2021 2347 by Ardelle Anton, RN ?Outcome: Not Progressing ?Goal: Diagnostic test results will improve ?05/02/2021 2348 by Ardelle Anton, RN ?Outcome: Not Progressing ?05/02/2021 2347 by Ardelle Anton, RN ?Outcome: Not Progressing ?Goal: Respiratory complications will improve ?05/02/2021 2348 by Ardelle Anton, RN ?Outcome: Not Progressing ?05/02/2021 2347 by Ardelle Anton, RN ?Outcome: Not Progressing ?Goal: Cardiovascular complication will be avoided ?05/02/2021 2348 by Ardelle Anton, RN ?Outcome: Not Progressing ?05/02/2021 2347 by Ardelle Anton, RN ?Outcome: Not Progressing ?  ?Problem: Activity: ?Goal: Risk for activity intolerance will decrease ?05/02/2021 2348 by Ardelle Anton, RN ?Outcome: Not Progressing ?05/02/2021 2347 by Ardelle Anton, RN ?Outcome: Not Progressing ?  ?Problem: Nutrition: ?Goal: Adequate nutrition will be maintained ?05/02/2021 2348 by Ardelle Anton, RN ?Outcome: Not Progressing ?05/02/2021 2347 by Ardelle Anton, RN ?Outcome: Not Progressing ?  ?Problem: Coping: ?Goal: Level of anxiety will decrease ?05/02/2021 2348 by Ardelle Anton, RN ?Outcome: Not Progressing ?05/02/2021 2347 by Ardelle Anton, RN ?Outcome: Not Progressing ?  ?Problem: Elimination: ?Goal: Will not experience complications related to bowel motility ?05/02/2021 2348 by Ardelle Anton, RN ?Outcome: Not Progressing ?05/02/2021 2347 by Ardelle Anton, RN ?Outcome: Not Progressing ?Goal: Will not experience complications related to urinary retention ?05/02/2021 2348 by Ardelle Anton, RN ?Outcome: Not Progressing ?05/02/2021 2347 by Ardelle Anton, RN ?Outcome: Not Progressing ?  ?Problem: Pain Managment: ?Goal: General experience of comfort will improve ?05/02/2021 2348 by Ardelle Anton, RN ?Outcome: Not Progressing ?05/02/2021 2347 by Ardelle Anton, RN ?Outcome: Not Progressing ?  ?Problem: Safety: ?Goal: Ability to remain free from injury will improve ?05/02/2021 2348 by Ardelle Anton, RN ?Outcome: Not Progressing ?05/02/2021 2347 by Ardelle Anton, RN ?Outcome: Not Progressing ?  ?Problem: Skin Integrity: ?Goal: Risk for impaired skin integrity will decrease ?05/02/2021 2348 by Ardelle Anton, RN ?Outcome: Not Progressing ?05/02/2021 2347 by Ardelle Anton, RN ?Outcome: Not Progressing ?  ?Problem: Activity: ?Goal: Will verbalize the importance of balancing activity with adequate rest periods ?05/02/2021 2348 by Ardelle Anton, RN ?Outcome: Not Progressing ?05/02/2021 2347 by Ardelle Anton, RN ?Outcome: Not Progressing ?  ?Problem: Education: ?Goal: Will be free of psychotic symptoms ?05/02/2021 2348 by Ardelle Anton, RN ?Outcome: Not Progressing ?05/02/2021 2347 by Ardelle Anton, RN ?Outcome: Not Progressing ?Goal: Knowledge of the  prescribed therapeutic regimen will improve ?05/02/2021 2348 by Ardelle Anton, RN ?Outcome: Not Progressing ?05/02/2021 2347 by Ardelle Anton, RN ?Outcome: Not Progressing ?  ?Problem: Coping: ?Goal: Coping ability will improve ?05/02/2021 2348 by Ardelle Anton, RN ?Outcome: Not Progressing ?05/02/2021 2347 by Ardelle Anton, RN ?Outcome: Not Progressing ?Goal: Will verbalize feelings ?05/02/2021 2348 by Tamala Bari,  Virgina Organ, RN ?Outcome: Not Progressing ?05/02/2021 2347 by Ardelle Anton, RN ?Outcome: Not Progressing ?  ?Problem: Health Behavior/Discharge Planning: ?Goal: Compliance with prescribed medication regimen will improve ?05/02/2021 2348 by Ardelle Anton, RN ?Outcome: Not Progressing ?05/02/2021 2347 by Ardelle Anton, RN ?Outcome: Not Progressing ?  ?Problem: Nutritional: ?Goal: Ability to achieve adequate nutritional intake will improve ?05/02/2021 2348 by Ardelle Anton, RN ?Outcome: Not Progressing ?05/02/2021 2347 by Ardelle Anton, RN ?Outcome: Not Progressing ?  ?Problem: Role Relationship: ?Goal: Ability to communicate needs accurately will improve ?05/02/2021 2348 by Ardelle Anton, RN ?Outcome: Not Progressing ?05/02/2021 2347 by Ardelle Anton, RN ?Outcome: Not Progressing ?Goal: Ability to interact with others will improve ?05/02/2021 2348 by Ardelle Anton, RN ?Outcome: Not Progressing ?05/02/2021 2347 by Ardelle Anton, RN ?Outcome: Not Progressing ?  ?Problem: Safety: ?Goal: Ability to redirect hostility and anger into socially appropriate behaviors will improve ?05/02/2021 2348 by Ardelle Anton, RN ?Outcome: Not Progressing ?05/02/2021 2347 by Ardelle Anton, RN ?Outcome: Not Progressing ?Goal: Ability to remain free from injury will improve ?05/02/2021 2348 by Ardelle Anton, RN ?Outcome: Not Progressing ?05/02/2021 2347 by Ardelle Anton,  RN ?Outcome: Not Progressing ?  ?Problem: Self-Care: ?Goal: Ability to participate in self-care as condition permits will improve ?05/02/2021 2348 by Ardelle Anton, RN ?Outcome: Not Progressing ?05/02/2021 2347 by Ardelle Anton, RN ?Outcome: Not Progressing ?  ?Problem: Self-Concept: ?Goal: Will verbalize positive feelings about self ?05/02/2021 2348 by Ardelle Anton, RN ?Outcome: Not Progressing ?05/02/2021 2347 by Ardelle Anton, RN ?Outcome: Not Progressing ?  ?Problem: Activity: ?Goal: Will verbalize the importance of balancing activity with adequate rest periods ?Outcome: Not Progressing ?  ?Problem: Education: ?Goal: Will be free of psychotic symptoms ?Outcome: Not Progressing ?Goal: Knowledge of the prescribed therapeutic regimen will improve ?Outcome: Not Progressing ?  ?Problem: Coping: ?Goal: Coping ability will improve ?Outcome: Not Progressing ?Goal: Will verbalize feelings ?Outcome: Not Progressing ?  ?Problem: Health Behavior/Discharge Planning: ?Goal: Compliance with prescribed medication regimen will improve ?Outcome: Not Progressing ?  ?Problem: Nutritional: ?Goal: Ability to achieve adequate nutritional intake will improve ?Outcome: Not Progressing ?  ?Problem: Role Relationship: ?Goal: Ability to communicate needs accurately will improve ?Outcome: Not Progressing ?Goal: Ability to interact with others will improve ?Outcome: Not Progressing ?  ?Problem: Safety: ?Goal: Ability to redirect hostility and anger into socially appropriate behaviors will improve ?Outcome: Not Progressing ?Goal: Ability to remain free from injury will improve ?Outcome: Not Progressing ?  ?Problem: Self-Care: ?Goal: Ability to participate in self-care as condition permits will improve ?Outcome: Not Progressing ?  ?Problem: Self-Concept: ?Goal: Will verbalize positive feelings about self ?Outcome: Not Progressing ?  ?Problem: Education: ?Goal: Knowledge of General Education information will  improve ?Description: Including pain rating scale, medication(s)/side effects and non-pharmacologic comfort measures ?05/02/2021 2348 by Ardelle Anton, RN ?Outcome: Not Progressing ?05/02/2021 2347 by Tamala Bari

## 2021-05-02 NOTE — ED Notes (Signed)
Red, green and lavender tubes drawn and sent. ?

## 2021-05-02 NOTE — ED Notes (Signed)
Pt given a snack at this time.  

## 2021-05-02 NOTE — ED Notes (Signed)
Pt was taken off his olanzipine and has not been restarted on it- Child psychotherapist states that this is his main medication that helps him and they have trialled him off of it before unsuccessfully ?

## 2021-05-02 NOTE — ED Triage Notes (Signed)
Per Child psychotherapist, Patient's lithium was increased on April 03, 2021. The social worker has noticed increased schizophrenic behavior. Patient was also taken off Risperadol which was recently added back. Social worker states patient is normally quiet and calm, but recently has been hearing voices, insomnia, delusional thinking which is unlike the patient's baseline, but happens when meds are altered. Social Worker [name] states patient recently had Lithium levels drawn, but aren't back from [facility]. [place] states bus dropped the patient at day program an hour early today and patient walked away because program wasn't open. Patient states he needs his meds. ?

## 2021-05-03 ENCOUNTER — Encounter: Payer: Self-pay | Admitting: Psychiatry

## 2021-05-03 DIAGNOSIS — F259 Schizoaffective disorder, unspecified: Secondary | ICD-10-CM | POA: Diagnosis not present

## 2021-05-03 MED ORDER — AMOXICILLIN 250 MG PO CAPS
500.0000 mg | ORAL_CAPSULE | Freq: Three times a day (TID) | ORAL | Status: AC
  Administered 2021-05-03 – 2021-05-06 (×8): 500 mg via ORAL
  Filled 2021-05-03 (×9): qty 2

## 2021-05-03 MED ORDER — HYDROXYZINE HCL 50 MG PO TABS
50.0000 mg | ORAL_TABLET | Freq: Once | ORAL | Status: AC
  Administered 2021-05-03: 50 mg via ORAL
  Filled 2021-05-03: qty 1

## 2021-05-03 MED ORDER — TRAZODONE HCL 50 MG PO TABS
50.0000 mg | ORAL_TABLET | Freq: Once | ORAL | Status: AC
  Administered 2021-05-03: 50 mg via ORAL
  Filled 2021-05-03: qty 1

## 2021-05-03 NOTE — Plan of Care (Signed)
D- Patient alert and oriented to person and situation. Patient presented in a preoccupied, but pleasant mood on assessment stating that he slept "very well" last night and had no complaints to voice to this writer at this time. Although patient can form some words and sentences, he is also hard to comprehend at times. Patient sounds as if he is mumbling and is tangential and has a flight of ideas. Patient denied SI, HI, AVH, and pain at this time. Patient also denied any signs/symptoms of depression and anxiety, stating that overall, he is feeling "perfect". Patient's stated goal for today, per his response in treatment team, is to get some "water for my coffee", in which he did this while exiting the craft room. ? ?A- Scheduled medications administered to patient, per MD orders. Support and encouragement provided.  Routine safety checks conducted every 15 minutes.  Patient informed to notify staff with problems or concerns. ? ?R- No adverse drug reactions noted. Patient contracts for safety at this time. Patient compliant with medications. Patient receptive, calm, and cooperative. Patient interacts well with others on the unit.  Patient remains safe at this time. ? ?Problem: Education: ?Goal: Knowledge of General Education information will improve ?Description: Including pain rating scale, medication(s)/side effects and non-pharmacologic comfort measures ?Outcome: Not Progressing ?  ?Problem: Health Behavior/Discharge Planning: ?Goal: Ability to manage health-related needs will improve ?Outcome: Not Progressing ?  ?Problem: Clinical Measurements: ?Goal: Ability to maintain clinical measurements within normal limits will improve ?Outcome: Not Progressing ?Goal: Will remain free from infection ?Outcome: Not Progressing ?Goal: Diagnostic test results will improve ?Outcome: Not Progressing ?Goal: Respiratory complications will improve ?Outcome: Not Progressing ?Goal: Cardiovascular complication will be  avoided ?Outcome: Not Progressing ?  ?Problem: Activity: ?Goal: Risk for activity intolerance will decrease ?Outcome: Not Progressing ?  ?Problem: Nutrition: ?Goal: Adequate nutrition will be maintained ?Outcome: Not Progressing ?  ?Problem: Coping: ?Goal: Level of anxiety will decrease ?Outcome: Not Progressing ?  ?Problem: Elimination: ?Goal: Will not experience complications related to bowel motility ?Outcome: Not Progressing ?Goal: Will not experience complications related to urinary retention ?Outcome: Not Progressing ?  ?Problem: Pain Managment: ?Goal: General experience of comfort will improve ?Outcome: Not Progressing ?  ?Problem: Safety: ?Goal: Ability to remain free from injury will improve ?Outcome: Not Progressing ?  ?Problem: Skin Integrity: ?Goal: Risk for impaired skin integrity will decrease ?Outcome: Not Progressing ?  ?Problem: Activity: ?Goal: Will verbalize the importance of balancing activity with adequate rest periods ?Outcome: Not Progressing ?  ?Problem: Education: ?Goal: Will be free of psychotic symptoms ?Outcome: Not Progressing ?Goal: Knowledge of the prescribed therapeutic regimen will improve ?Outcome: Not Progressing ?  ?Problem: Coping: ?Goal: Coping ability will improve ?Outcome: Not Progressing ?Goal: Will verbalize feelings ?Outcome: Not Progressing ?  ?Problem: Health Behavior/Discharge Planning: ?Goal: Compliance with prescribed medication regimen will improve ?Outcome: Not Progressing ?  ?Problem: Nutritional: ?Goal: Ability to achieve adequate nutritional intake will improve ?Outcome: Not Progressing ?  ?Problem: Role Relationship: ?Goal: Ability to communicate needs accurately will improve ?Outcome: Not Progressing ?Goal: Ability to interact with others will improve ?Outcome: Not Progressing ?  ?Problem: Safety: ?Goal: Ability to redirect hostility and anger into socially appropriate behaviors will improve ?Outcome: Not Progressing ?Goal: Ability to remain free from injury  will improve ?Outcome: Not Progressing ?  ?Problem: Self-Care: ?Goal: Ability to participate in self-care as condition permits will improve ?Outcome: Not Progressing ?  ?Problem: Self-Concept: ?Goal: Will verbalize positive feelings about self ?Outcome: Not Progressing ?  ?Problem:  Activity: ?Goal: Will verbalize the importance of balancing activity with adequate rest periods ?Outcome: Not Progressing ?  ?Problem: Education: ?Goal: Will be free of psychotic symptoms ?Outcome: Not Progressing ?Goal: Knowledge of the prescribed therapeutic regimen will improve ?Outcome: Not Progressing ?  ?Problem: Coping: ?Goal: Coping ability will improve ?Outcome: Not Progressing ?Goal: Will verbalize feelings ?Outcome: Not Progressing ?  ?Problem: Health Behavior/Discharge Planning: ?Goal: Compliance with prescribed medication regimen will improve ?Outcome: Not Progressing ?  ?Problem: Nutritional: ?Goal: Ability to achieve adequate nutritional intake will improve ?Outcome: Not Progressing ?  ?Problem: Role Relationship: ?Goal: Ability to communicate needs accurately will improve ?Outcome: Not Progressing ?Goal: Ability to interact with others will improve ?Outcome: Not Progressing ?  ?Problem: Safety: ?Goal: Ability to redirect hostility and anger into socially appropriate behaviors will improve ?Outcome: Not Progressing ?Goal: Ability to remain free from injury will improve ?Outcome: Not Progressing ?  ?Problem: Self-Care: ?Goal: Ability to participate in self-care as condition permits will improve ?Outcome: Not Progressing ?  ?Problem: Self-Concept: ?Goal: Will verbalize positive feelings about self ?Outcome: Not Progressing ?  ?

## 2021-05-03 NOTE — Progress Notes (Signed)
Admission note pt admitted for the ED, brought in by his social worker. Pt was in a group setting and his medications were changed. After that his behavior got worse. Pre ED report he started hearing voice and acting bizarre. In the interview process the patient was talking, but he was incoherent. He  nodded no to SI/HI thoughts, but he Is responding to internal stimuli, because  was observed  talking to himself. Pt acted weak and feeble during the interview process. However he was observed walking in his room, with a steady and strong gait. He can also talk more clearly when he wants to. Pt has been manipulative by trying to act helpless and weak. He was put in a room close to the nurse's station for closer observation and assistance if needed. ?

## 2021-05-03 NOTE — Progress Notes (Signed)
Pt has been up all night, taking baths, taking off clothes, putting crumbs on floor, spilling stuff on floor and he was very disorganized in behavior. Pt got prn for his restlessness. Pt talks but most of the time he does not make any sense. He asked for grease for his hair and someone to braid his hair. He requires a lot of redirections. Pt walks well,gait, steady and does not need a walker. ?

## 2021-05-03 NOTE — BH IP Treatment Plan (Signed)
Interdisciplinary Treatment and Diagnostic Plan Update ? ?05/03/2021 ?Time of Session: 9:40 AM ?Skip Estimable ?MRN: [id number] ? ?Principal Diagnosis: Schizoaffective disorder ([place]) ? ?Secondary Diagnoses: Principal Problem: ?  Schizoaffective disorder ([place]) ? ? ?Current Medications:  ?Current Facility-Administered Medications  ?Medication Dose Route Frequency Provider Last Rate Last Admin  ? acetaminophen (TYLENOL) tablet 650 mg  650 mg Oral Q6H PRN Sherlon Handing, NP      ? alum & mag hydroxide-simeth (MAALOX/MYLANTA) 200-200-20 MG/5ML suspension 30 mL  30 mL Oral Q4H PRN Waldon Merl F, NP      ? amantadine (SYMMETREL) capsule 100 mg  100 mg Oral BID Waldon Merl F, NP   100 mg at 05/03/21 0277  ? amLODipine (NORVASC) tablet 5 mg  5 mg Oral Daily Waldon Merl F, NP   5 mg at 05/03/21 4128  ? amoxicillin (AMOXIL) capsule 500 mg  500 mg Oral TID Sherlon Handing, NP   500 mg at 05/03/21 7867  ? atorvastatin (LIPITOR) tablet 40 mg  40 mg Oral QHS Sherlon Handing, NP   40 mg at 05/02/21 2228  ? fluticasone furoate-vilanterol (BREO ELLIPTA) 200-25 MCG/ACT 1 puff  1 puff Inhalation Daily Waldon Merl F, NP   1 puff at 05/02/21 2257  ? lithium carbonate (ESKALITH) CR tablet 450 mg  450 mg Oral q AM Sherlon Handing, NP   450 mg at 05/03/21 6720  ? lithium carbonate (LITHOBID) CR tablet 300 mg  300 mg Oral QHS Waldon Merl F, NP   300 mg at 05/02/21 2229  ? losartan (COZAAR) tablet 50 mg  50 mg Oral Daily Waldon Merl F, NP   50 mg at 05/03/21 9470  ? magnesium hydroxide (MILK OF MAGNESIA) suspension 30 mL  30 mL Oral Daily PRN Sherlon Handing, NP      ? montelukast (SINGULAIR) tablet 10 mg  10 mg Oral QHS Sherlon Handing, NP   10 mg at 05/02/21 2227  ? OLANZapine (ZYPREXA) tablet 10 mg  10 mg Oral Daily Waldon Merl F, NP   10 mg at 05/03/21 9628  ? risperiDONE (RISPERDAL M-TABS) disintegrating tablet 1 mg  1 mg Oral BID PRN Sherlon Handing, NP   1 mg at 05/03/21 0410   ? ?PTA Medications: ?Medications Prior to Admission  ?Medication Sig Dispense Refill Last Dose  ? amantadine (SYMMETREL) 100 MG capsule Take 100 mg by mouth 2 (two) times daily.     ? amLODipine (NORVASC) 5 MG tablet Take 1 tablet (5 mg total) by mouth daily. 30 tablet 0   ? amoxicillin (AMOXIL) 500 MG capsule Take 500 mg by mouth 3 (three) times daily.     ? atorvastatin (LIPITOR) 40 MG tablet Take 40 mg by mouth at bedtime.     ? ferrous sulfate 325 (65 FE) MG tablet Take 325 mg by mouth daily with breakfast.     ? fluticasone (FLONASE) 50 MCG/ACT nasal spray Place 2 sprays into both nostrils daily.     ? fluticasone furoate-vilanterol (BREO ELLIPTA) 200-25 MCG/ACT AEPB Inhale 1 puff into the lungs daily.     ? lithium carbonate (ESKALITH) 450 MG CR tablet Take 1 tablet (450 mg total) by mouth every 12 (twelve) hours. (Patient taking differently: Take 450 mg by mouth in the morning.) 60 tablet 0   ? lithium carbonate (LITHOBID) 300 MG CR tablet Take 300 mg by mouth at bedtime.     ? losartan (COZAAR) 50 MG tablet Take 50 mg  by mouth daily.     ? montelukast (SINGULAIR) 10 MG tablet Take 10 mg by mouth at bedtime.     ? risperiDONE (RISPERDAL M-TABS) 1 MG disintegrating tablet Take 1 mg by mouth 2 (two) times daily as needed (agitation).     ? risperiDONE ER (PERSERIS) 120 MG PRSY Inject 120 mg into the skin every 30 (thirty) days.     ? ? ?Patient Stressors:   ? ?Patient Strengths:   ? ?Treatment Modalities: Medication Management, Group therapy, Case management,  ?1 to 1 session with clinician, Psychoeducation, Recreational therapy. ? ? ?Physician Treatment Plan for Primary Diagnosis: Schizoaffective disorder ([place]) ?Long Term Goal(s):    ? ?Short Term Goals:   ? ?Medication Management: Evaluate patient's response, side effects, and tolerance of medication regimen. ? ?Therapeutic Interventions: 1 to 1 sessions, Unit Group sessions and Medication administration. ? ?Evaluation of Outcomes: Not Met ? ?Physician  Treatment Plan for Secondary Diagnosis: Principal Problem: ?  Schizoaffective disorder ([place]) ? ?Long Term Goal(s):    ? ?Short Term Goals:      ? ?Medication Management: Evaluate patient's response, side effects, and tolerance of medication regimen. ? ?Therapeutic Interventions: 1 to 1 sessions, Unit Group sessions and Medication administration. ? ?Evaluation of Outcomes: Not Met ? ? ?RN Treatment Plan for Primary Diagnosis: Schizoaffective disorder ([place]) ?Long Term Goal(s): Knowledge of disease and therapeutic regimen to maintain health will improve ? ?Short Term Goals: Ability to remain free from injury will improve, Ability to verbalize frustration and anger appropriately will improve, Ability to demonstrate self-control, Ability to participate in decision making will improve, Ability to verbalize feelings will improve, Ability to disclose and discuss suicidal ideas, Ability to identify and develop effective coping behaviors will improve, and Compliance with prescribed medications will improve ? ?Medication Management: RN will administer medications as ordered by provider, will assess and evaluate patient's response and provide education to patient for prescribed medication. RN will report any adverse and/or side effects to prescribing provider. ? ?Therapeutic Interventions: 1 on 1 counseling sessions, Psychoeducation, Medication administration, Evaluate responses to treatment, Monitor vital signs and CBGs as ordered, Perform/monitor CIWA, COWS, AIMS and Fall Risk screenings as ordered, Perform wound care treatments as ordered. ? ?Evaluation of Outcomes: Not Met ? ? ?LCSW Treatment Plan for Primary Diagnosis: Schizoaffective disorder ([place]) ?Long Term Goal(s): Safe transition to appropriate next level of care at discharge, Engage patient in therapeutic group addressing interpersonal concerns. ? ?Short Term Goals: Engage patient in aftercare planning with referrals and resources, Increase social support, Increase  ability to appropriately verbalize feelings, Increase emotional regulation, Facilitate acceptance of mental health diagnosis and concerns, and Increase skills for wellness and recovery ? ?Therapeutic Interventions: Assess for all discharge needs, 1 to 1 time with Education officer, museum, Explore available resources and support systems, Assess for adequacy in community support network, Educate family and significant other(s) on suicide prevention, Complete Psychosocial Assessment, Interpersonal group therapy. ? ?Evaluation of Outcomes: Not Met ? ? ?Progress in Treatment: ?Attending groups: No. ?Participating in groups: No. ?Taking medication as prescribed: Yes. ?Toleration medication: Yes. ?Family/Significant other contact made: No, will contact:  when given permission.  ?Patient understands diagnosis: No. ?Discussing patient identified problems/goals with staff: No. ?Medical problems stabilized or resolved: Yes. ?Denies suicidal/homicidal ideation: Yes. ?Issues/concerns per patient self-inventory: No. ?Other: none. ? ?New problem(s) identified: No, Describe:  none reported ? ?New Short Term/Long Term Goal(s): detox, elimination of symptoms of psychosis, medication management for mood stabilization; elimination of SI thoughts; development of comprehensive mental  wellness/sobriety plan. ? ?Patient Goals:  "Water in my coffee." Pt was highly disorganized, mumbling, and comments do not seem applicable to the conversation.  ? ?Discharge Plan or Barriers: CSW will assist pt with development of an appropriate aftercare/discharge plan.  ? ?Reason for Continuation of Hospitalization: Medication stabilization ?Other; describe disorganized thinking ? ?Estimated Length of Stay: 5-7 days ? ?Last 3 [place] Suicide Severity Risk Score: ?Flowsheet Row Admission (Current) from 05/02/2021 in [facility] ?Most recent reading at 05/02/2021  9:47 PM ED from 05/02/2021 in [facility] ?Most recent reading at 05/02/2021  2:21 PM  ?C-SSRS RISK CATEGORY No Risk No Risk  ? ?  ? ? ?Last PHQ 2/9 Scores: ?   ? View : No data to display.  ?  ?  ?  ? ? ?Scribe for Treatment Team: ?Shirl Harris, LCSW

## 2021-05-03 NOTE — Progress Notes (Signed)
This Probation officer spoke with [name], patient's case worker, at 42 and he stated to this writer that patient is normally a guy like any one of [place], but was taken off of his medication. It was also stated that when patient is pacing around, he is looking for a cigarette, and when he starts doing this he should be given PRN Risperdal. This Probation officer notes that patient does have this medication on his list of PRNs, however, if patient does start to pace around, it will be evaluated if patient needs this medication. ?

## 2021-05-03 NOTE — Progress Notes (Addendum)
ADDENDUM  ?Patient's guardian has signed and returned voluntary treatment form to CSW; form placed in chart.  ? ?Signed:  ?Corky Crafts, MSW, LCSWA, LCAS ?05/03/2021 3:26 PM ? ? ?[facility] LCSW Progress Note ?  ?05/03/2021    3:05 PM ? ?JARRYN ALTLAND  ? ?[id number]  ? ?Type of Contact and Topic:  Guardianship Contact  ? ?Patient's guardian has verbally consented for patient to receive voluntary treatment. Voluntary forms have been emailed to guardian. Situation ongoing, CSW will continue to monitor and update note as more information becomes available.  ? ? ? 05/03/21 1504  ?Legal Guardian  ?Does Patient Have a Automotive engineer Guardian? Yes  ?Legal Guardian Other:  ?Legal Guardian Contact Information Laurita Quint, [facility] [place], [phone]  ?Copy of Legal Guardianship Form in Chart No - copy requested  ?Legal Guardian Notified of Arrival  Successfully notified  ? ? ?  ?Signed:  ?Corky Crafts, MSW, LCSWA, LCAS ?05/03/2021 3:05 PM ?  ?  ?

## 2021-05-03 NOTE — Progress Notes (Signed)
Patient was walking around the unit, to other closed rooms on the unit and talking loudly, to himself. Patient also was messing with the plaques on the wall, so this writer administered PRN Risperdal. Patient tolerated medication administration well, without any issues. ?

## 2021-05-03 NOTE — Progress Notes (Signed)
This Probation officer held patient's 1415 dose of Amoxicillin being that this is a duplicate dose. Patient was given a dose at 1116. MD made aware, via secure chat, and he is ok with this writer holding this dose. ?

## 2021-05-03 NOTE — [facility] Suicide Risk Assessment (Signed)
[facility] Admission Suicide Risk Assessment ? ? ?Nursing information obtained from:  Patient ?Demographic factors:  Male, Low socioeconomic status, Unemployed ?Current Mental Status:  Plan to harm others ?Loss Factors:  Decline in physical health, Financial problems / change in socioeconomic status ?Historical Factors:  Impulsivity ?Risk Reduction Factors:  Positive social support, Positive therapeutic relationship ? ?Total Time spent with patient: 1 hour ?Principal Problem: Schizoaffective disorder (HCC) ?Diagnosis:  Principal Problem: ?  Schizoaffective disorder (HCC) ?Active Problems: ?  Hypertension ? ?Subjective Data: Patient was interviewed but was unable to offer an articulate description of current situation.  He was able to answer a few yes and no questions and denied having any suicidal or homicidal thoughts at all and denied having any hallucinations.  Has not had any significant behavior problems since presenting. ? ?Continued Clinical Symptoms:  ?Alcohol Use Disorder Identification Test Final Score (AUDIT): 0 ?The "Alcohol Use Disorders Identification Test", Guidelines for Use in Primary Care, Second Edition.  [employer] [facility]). ?Score between 0-7:  no or low risk or alcohol related problems. ?Score between 8-15:  moderate risk of alcohol related problems. ?Score between 16-19:  high risk of alcohol related problems. ?Score 20 or above:  warrants further diagnostic evaluation for alcohol dependence and treatment. ? ? ?CLINICAL FACTORS:  ? Schizophrenia:   Paranoid or undifferentiated type ? ? ?Musculoskeletal: ?Strength & Muscle Tone: within normal limits ?Gait & Station: shuffle ?Patient leans: N/A ? ?Psychiatric Specialty Exam: ? ?Presentation  ?General Appearance: Casual ? ?Eye Contact:Good ? ?Speech:Clear and Coherent ? ?Speech Volume:Normal ? ?Handedness:No data recorded ? ?Mood and Affect  ?Mood:Anxious (stated "I feel like  I need medicine") ? ?Affect:Congruent ? ? ?Thought Process   ?Thought Processes:Coherent ? ?Descriptions of Associations:Loose ? ?Orientation:Partial ? ?Thought Content:Scattered ? ?History of Schizophrenia/Schizoaffective disorder:Yes ? ?Duration of Psychotic Symptoms:Greater than six months ? ?Hallucinations:Hallucinations: -- (appears to be RTIS) ? ?Ideas of Reference:Delusions ? ?Suicidal Thoughts:Suicidal Thoughts: No ? ?Homicidal Thoughts:Homicidal Thoughts: No ? ? ?Sensorium  ?Memory:Immediate Poor ? ?Judgment:Impaired ? ?Insight:Poor ? ? ?Executive Functions  ?Concentration:Poor ? ?Attention Span:Fair ? ?Recall:Fair ? ?Fund of Knowledge:Poor ? ?Language:Poor ? ? ?Psychomotor Activity  ?Psychomotor Activity:Psychomotor Activity: Normal (at time of interview) ? ? ?Assets  ?Assets:Financial Resources/Insurance; Housing; Resilience; Physical Health; Social Support ? ? ?Sleep  ?Sleep:Sleep: Good ? ? ? ?Physical Exam: ?Physical Exam ?Vitals and nursing note reviewed.  ?Constitutional:   ?   Appearance: Normal appearance.  ?HENT:  ?   Head: Normocephalic and atraumatic.  ?   Mouth/Throat:  ?   Pharynx: Oropharynx is clear.  ?Eyes:  ?   Pupils: Pupils are equal, round, and reactive to light.  ?Cardiovascular:  ?   Rate and Rhythm: Normal rate and regular rhythm.  ?Pulmonary:  ?   Effort: Pulmonary effort is normal.  ?   Breath sounds: Normal breath sounds.  ?Abdominal:  ?   General: Abdomen is flat.  ?   Palpations: Abdomen is soft.  ?Musculoskeletal:     ?   General: Normal range of motion.  ?Skin: ?   General: Skin is warm and dry.  ?Neurological:  ?   General: No focal deficit present.  ?   Mental Status: He is alert. Mental status is at baseline.  ?Psychiatric:     ?   Attention and Perception: He is inattentive.     ?   Mood and Affect: Affect is blunt.     ?   Speech: He is noncommunicative. Speech is slurred.     ?  Behavior: Behavior is agitated. Behavior is not aggressive.  ? ?Review of Systems  ?Unable to perform ROS: Psychiatric disorder  ?Psychiatric/Behavioral:   Negative for depression, hallucinations, memory loss, substance abuse and suicidal ideas. The patient is not nervous/anxious and does not have insomnia.   ?Blood pressure (!) 144/87, pulse 95, temperature 97.8 ?F (36.6 ?C), resp. rate 18, height 5' 6.93" (1.7 m), weight 64 kg, SpO2 100 %. Body mass index is 22.13 kg/m?. ? ? ?COGNITIVE FEATURES THAT CONTRIBUTE TO RISK:  ?Loss of executive function   ? ?SUICIDE RISK:  ? Minimal: No identifiable suicidal ideation.  Patients presenting with no risk factors but with morbid ruminations; may be classified as minimal risk based on the severity of the depressive symptoms ? ?PLAN OF CARE: Continue 15-minute checks.  Patient is not showing any signs of any dangerous to his self.  Suicidal ideation had not been part of the reason for presentation.  Restarting appropriate medications.  Ongoing daily assessment including of suicidality prior to discharge planning ? ?I certify that inpatient services furnished can reasonably be expected to improve the patient's condition.  ? ?Mordecai Rasmussen, MD ?05/03/2021, 1:25 PM ? ?

## 2021-05-03 NOTE — Progress Notes (Signed)
Patient has been restless and pacing around the unit, talking to himself, clearly responding to internal stimuli. Patient is still pleasant and remains safe on the unit. ?

## 2021-05-03 NOTE — B[facility] Counselor (Signed)
Adult Comprehensive Assessment ? ?Patient ID: [name], male   DOB: 07-04-1960, 61 y.o.   MRN: [id number] ? ?Information Source: ?Information source: Patient [name], caseworker, [phone]) ? ?Current Stressors:  ?Patient states their primary concerns and needs for treatment are:: Pt was taken off three of his medications in March and last friday he began talking about things 20 years in the past and saying that [name]'s children are his children. [name] states that this is when he knew something was wrong. ?Patient states their goals for this hospitilization and ongoing recovery are:: [name] reports that pt is here for medication stabilization. ? ?Living/Environment/Situation:  ?Living Arrangements: Group Home ?Living conditions (as described by patient or guardian): Pt lives in a group home. ?Who else lives in the home?: Other residents ?How long has patient lived in current situation?: "Six years" ?What is atmosphere in current home: Comfortable, Supportive ? ?Family History:  ?Marital status: Single ?Are you sexually active?: No ?What is your sexual orientation?: Heterosexual ?Has your sexual activity been affected by drugs, alcohol, medication, or emotional stress?: N/A ?Does patient have children?: No ? ?Childhood History:  ?By whom was/is the patient raised?: Mother, Other (Comment) Engineer, petroleum) ?Description of patient's relationship with caregiver when they were a child: Pt reports good relationship ?Patient's description of current relationship with people who raised him/her: Mother is deceased. ?How were you disciplined when you got in trouble as a child/adolescent?: UTA ?Did patient suffer any verbal/emotional/physical/sexual abuse as a child?: No ?Did patient suffer from severe childhood neglect?: No ?Has patient ever been sexually abused/assaulted/raped as an adolescent or adult?: No ?Was the patient ever a victim of a crime or a disaster?: No ?Witnessed domestic violence?: No ?Has patient been  affected by domestic violence as an adult?: No ? 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 ?Employment/Work Situation:   ?Employment Situation: On disability ?Why is Patient on Disability: Mental Health ?How Long has Patient Been on Disability: "The last 30 years." ?Patient's Job has Been Impacted by Current Illness: No ?What is the Longest Time Patient has Held a Job?: N/A ?Where was the Patient Employed at that Time?: N/A ?Has Patient ever Been in the Military?: No ? ?Financial Resources:   ?Museum/gallery curator resources: [employer], Medicare ?Does patient have a representative payee or guardian?: Yes ?Name of representative payee or guardian: [name], [facility] ? ?Alcohol/Substance Abuse:   ?What has been your use of drugs/alcohol within the last 12 months?: Past history of cocaine use. Pt is a ppd smoker who is not interested in quitting. ?If attempted suicide, did drugs/alcohol play a role in this?:  (N/A) ?Alcohol/Substance Abuse Treatment Hx: Past Tx, Outpatient ?If yes, describe treatment: [facility] in the past (6 years ago), [facility] [facility] for medication management, and [facility] (Day program and [place]) ?Has alcohol/substance abuse ever caused legal problems?: No ? ?Social Support System:   ?Patient's Community Support System: Good ?Type of faith/religion: [name] ?How does patient's faith help to cope with current illness?: "Pray" ? ?Leisure/Recreation:   ?Do You Have Hobbies?: Yes ?Leisure and Hobbies: "No hobbies, just smoking." ? ?Strengths/Needs:   ?What is the patient's perception of their strengths?: "Can communicate his needs when he is stable, willing to work when he is stable. 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 ? ?Discharge Plan:   ?Currently receiving community mental health services: Yes (From Whom) [name] for [facility] management, and [facility] (Day program and [facility])) ?Patient states concerns and preferences for aftercare planning are: Pt to return to current outpatient providers ?Patient states they will know when they are safe and ready for discharge when: "Pt is pretty quiet, not talking to himself, able to answer questions in an organized manner. You'll know he's ready to go when he asks if he can live on his own." ?Does patient have access to transportation?: Yes ?Does patient have financial barriers related to discharge medications?: No ?Patient description of barriers related to discharge medications: N/A ?Will patient be returning to same living situation after discharge?: Yes ? ?Summary/Recommendations:   ?Summary and Recommendations (to be completed by the evaluator): Patient is a 61 year old, single, male from [place], [place] ([place] He stated that he is here because he ?couldn?t get any shoes? and endorsed a goal of ?Fat Albert? and ?water in his coffee?. During the interaction, pt was extremely disorganized with garbled speech. CSW later contacted the guardian, who gave permission to speak with case worker, [name] to complete the assessment. [name] stated that pt was taken off three of his medications in March. [name] shared that recently he began talking to himself, focusing on things from 20 years ago, and claiming that [place] children were his. [name] endorsed a goal of medication stabilization. Pt lives in a group home and can return there upon discharge, per [name]. History of cocaine use reported. Pt is currently a pack per day smoker who denied any  interest in cessation. He receives medication management through [facility] and day treatment/[facility] through [facility]. Pt has a primary diagnosis of Schizoaffective disorder. Recommendations include crisis stabilization, therapeutic milieu, encourage group attendance and participation, medication management for mood stabilization, and development of comprehensive mental wellness plan. ? ?Shirl Harris. 05/03/2021 ?
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
## 2021-05-03 NOTE — Group Note (Signed)
[facility] LCSW Group Therapy Note ? ? ?Group Date: 05/03/2021 ?Start Time: 1300 ?End Time: 1400 ? ?Type of Therapy and Topic:  Group Therapy:  Feelings around Relapse and Recovery ? ?Participation Level:  Did Not Attend  ? ?Mood: ? ?Description of Group:   ? Patients in this group will discuss emotions they experience before and after a relapse. They will process how experiencing these feelings, or avoidance of experiencing them, relates to having a relapse. Facilitator will guide patients to explore emotions they have related to recovery. Patients will be encouraged to process which emotions are more powerful. They will be guided to discuss the emotional reaction significant others in their lives may have to patients? relapse or recovery. Patients will be assisted in exploring ways to respond to the emotions of others without this contributing to a relapse. ? ?Therapeutic Goals: ?Patient will identify two or more emotions that lead to relapse for them:  ?Patient will identify two emotions that result when they relapse:  ?Patient will identify two emotions related to recovery:  ?Patient will demonstrate ability to communicate their needs through discussion and/or role plays. ? ? ?Summary of Patient Progress: ? ?Patient did not attend group despite encouraged participation.  ? ? ?Therapeutic Modalities:   ?Cognitive Behavioral Therapy ?Solution-Focused Therapy ?Assertiveness Training ?Relapse Prevention Therapy ? ? ?Corky Crafts, LCSWA ?

## 2021-05-03 NOTE — [facility] Counselor (Signed)
CSW met with pt to attempt PSA. Pt presented with disorganized thoughts. When asked what brought him to the hospital he replied, "Couldn't get any shoes" and stated that his goal was "Fat Gwenlyn Perking". Some of his responses did seem appropriate, however, at this time it does not appear that he can participate in the completion of the PSA. Pt did complain of issues with walking, as he attempted to pull his pants down. CSW informed pt that his nurse would be notified regarding his concerns. Contact was ended without issue.  ? ?Nurse notified of his concerns and informed CSW that pt has guardian. CSW contacted guardian who gave verbal consent for treatment (see previous progress note) and advised CSW to contact casework, [name] [phone]) regarding completion of PSA. Contact ended without incident.  ? ?CSW attempted contact with caseworker, [name]. Unable to make contact and HIPPA compliant voicemail left with contact information for follow up.  ? ?[name]. Guerry Bruin, MSW, LCSW, LCAS ?05/03/2021 3:38 PM ? ?

## 2021-05-03 NOTE — Progress Notes (Signed)
Patient has been actively talking to himself, in his room, for the past hour. This Probation officer asked patient to pick up his belongings from out in the hallway. Patient got upset with this writer stating "you always want me to do something I don't want to do". Patient did pick up his belongings and went back into his room, talking to himself, responding to internal stimuli. ?

## 2021-05-04 DIAGNOSIS — F259 Schizoaffective disorder, unspecified: Secondary | ICD-10-CM | POA: Diagnosis not present

## 2021-05-04 MED ORDER — OLANZAPINE 10 MG IM SOLR
5.0000 mg | Freq: Three times a day (TID) | INTRAMUSCULAR | Status: AC | PRN
  Administered 2021-05-05: 5 mg via INTRAMUSCULAR
  Filled 2021-05-04: qty 10

## 2021-05-04 MED ORDER — LORAZEPAM 2 MG/ML IJ SOLN
2.0000 mg | Freq: Once | INTRAMUSCULAR | Status: AC
  Administered 2021-05-04: 2 mg via INTRAMUSCULAR
  Filled 2021-05-04: qty 1

## 2021-05-04 NOTE — Plan of Care (Signed)
D: Pt alert and when asked in a yes or no fashion patient is able to endorse yes he is experiencing depression but no he is not experiencing anxiety at this time. Pt appears tired and depressed. It was reported that the patient was up most of the night . Pt says no to experiencing any pain at this time. Pt says no to experiencing any SI/HI but yes AVH at this time.  ? ?Pt has had at least one incontinent episode during this shift. Pt is difficult to understand and needs set up/assistance for meals. Pt wonders unit and can get confused at time. Pt is redirectable however spends most of the day being directed/redirected by staff.  ? ?At one point pt made statement of wanting to go home.   ? ?A: Scheduled medications administered to pt, per MD orders. Support and encouragement provided. Frequent verbal contact made. Routine safety checks conducted q15 minutes.  ? ?R: No adverse drug reactions noted. Pt verbally contracts for safety at this time. Pt compliant with medications and treatment plan. Pt attempts interacts with others on the unit but communicates poorly and needs assistance with eating. Pt remains safe at this time. Will continue to monitor.  ? ?Problem: Education: ?Goal: Knowledge of General Education information will improve ?Description: Including pain rating scale, medication(s)/side effects and non-pharmacologic comfort measures ?Outcome: Not Progressing ?  ?Problem: Health Behavior/Discharge Planning: ?Goal: Ability to manage health-related needs will improve ?Outcome: Not Progressing ?  ?

## 2021-05-04 NOTE — Progress Notes (Signed)
Patient appears to be responding to intenal stimuli  is disorganized banging his bed. Throwing his clothes in the shower walking naked. Oto of vistaril and trazodone given and not effective. Patient required lots of redirection. ? ?Support and encouragement ongoing. Patient remains safe.   ?
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
## 2021-05-04 NOTE — Progress Notes (Signed)
Pt's confusion appears progressively worse in the evening. Pt continues to get turned around. Pt has been found in the shower twice with clothing on and with foot over the drain then standing naked in the room. Pt able to be direct to get dressed and direct on where to go. ?

## 2021-05-04 NOTE — Progress Notes (Signed)
Patient compliant with medications continues to be disorganized requires a lot of redirections. Walking with no shoes no socks, he forgets where his room is. Patient  ripped the bathroom curtain and soaking it in the commode.  ? ?Support and encouragement provided.  ?

## 2021-05-04 NOTE — Group Note (Signed)
LCSW Group Therapy Note ? ?Group Date: 05/04/2021 ?Start Time: 1300 ?End Time: 1400 ? ? ?Type of Therapy and Topic:  Group Therapy - How To Cope with Nervousness about Discharge  ? ?Participation Level:  Did Not Attend  ? ?Description of Group ?This process group involved identification of patients' feelings about discharge. Some of them are scheduled to be discharged soon, while others are new admissions, but each of them was asked to share thoughts and feelings surrounding discharge from the hospital. One common theme was that they are excited at the prospect of going home, while another was that many of them are apprehensive about sharing why they were hospitalized. Patients were given the opportunity to discuss these feelings with their peers in preparation for discharge. ? ?Therapeutic Goals ? ?Patient will identify their overall feelings about pending discharge. ?Patient will think about how they might proactively address issues that they believe will once again arise once they get home (i.e. with parents). ?Patients will participate in discussion about having hope for change. ? ? ?Summary of Patient Progress:   ?Group was not held due to staffing shortages; group will resume once staffing is adequate.  ? ?Therapeutic Modalities ?Cognitive Behavioral Therapy ? ? ?Katreena Schupp W Hiral Lukasiewicz, LCSWA ?05/04/2021  3:12 PM   ?
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
## 2021-05-04 NOTE — Progress Notes (Signed)
MD notified patients potassium level was 3.0 as of 05/02/21. Verbal orders given to order BMP for tomorrow morning, orders placed. Pt encouraged to eat foods that can be a good source of potassium. ?

## 2021-05-05 DIAGNOSIS — F259 Schizoaffective disorder, unspecified: Secondary | ICD-10-CM | POA: Diagnosis not present

## 2021-05-05 LAB — BASIC METABOLIC PANEL
Anion gap: 8 (ref 5–15)
BUN: 6 mg/dL (ref 6–20)
CO2: 26 mmol/L (ref 22–32)
Calcium: 9.7 mg/dL (ref 8.9–10.3)
Chloride: 108 mmol/L (ref 98–111)
Creatinine, Ser: 0.97 mg/dL (ref 0.61–1.24)
GFR, Estimated: >60 mL/min (ref >60)
Glucose, Bld: 106 mg/dL — ABNORMAL HIGH (ref 70–99)
Potassium: 3.3 mmol/L — ABNORMAL LOW (ref 3.5–5.1)
Sodium: 142 mmol/L (ref 135–145)

## 2021-05-05 LAB — LITHIUM LEVEL: Lithium Lvl: 1.02 mmol/L (ref 0.60–1.20)

## 2021-05-05 MED ORDER — OLANZAPINE 10 MG PO TABS
10.0000 mg | ORAL_TABLET | Freq: Two times a day (BID) | ORAL | Status: DC
  Administered 2021-05-05 – 2021-05-06 (×2): 10 mg via ORAL
  Filled 2021-05-05 (×2): qty 1

## 2021-05-05 NOTE — Progress Notes (Signed)
Pt compliant with medication administration per MD orders. Pt remains safe on 1:1 for safety with a sitter present. Pt remains safe on the unit.  ?
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
## 2021-05-05 NOTE — Progress Notes (Signed)
1:1 Patient Hourly Rounding ? ?1000: Seated in chair of hallway right by nurse's station for 1:1, RN's present. ? ?1100: Outside of nurse's station for 1:1, RN's present. ? ?1200: Patient in dayroom, with MHT present. ? ?1300: Patient in dayroom, with RN present. ? ?1400: Patient in dayroom, with RN present. ? ?1500: Patient in dayroom, with MHT present. ? ?1600: Patient in dayroom, with MHT present. ? ?1700: Patient in dayroom, with MHT present. ? ?1800: Patient in dayroom, with RN/MHT present. ? ?1900: Patient in dayroom, with MHT present. ?

## 2021-05-05 NOTE — Progress Notes (Signed)
Patient on 1:1 for safety with a sitter present. Pt is redirectable. Pt is without complaint at this time. Pt remains safe on the unit.  ?
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
## 2021-05-05 NOTE — Group Note (Signed)
LCSW Group Therapy Note ? ?Group Date: 05/05/2021 ?Start Time: 1330 ?End Time: 1430 ? ? ?Type of Therapy and Topic:  Group Therapy - Healthy vs Unhealthy Coping Skills ? ?Participation Level:  Did Not Attend  ? ?Description of Group ?The focus of this group was to determine what unhealthy coping techniques typically are used by group members and what healthy coping techniques would be helpful in coping with various problems. Patients were guided in becoming aware of the differences between healthy and unhealthy coping techniques. Patients were asked to identify 2-3 healthy coping skills they would like to learn to use more effectively. ? ?Therapeutic Goals ?Patients learned that coping is what human beings do all day long to deal with various situations in their lives ?Patients defined and discussed healthy vs unhealthy coping techniques ?Patients identified their preferred coping techniques and identified whether these were healthy or unhealthy ?Patients determined 2-3 healthy coping skills they would like to become more familiar with and use more often. ?Patients provided support and ideas to each other ? ? ?Summary of Patient Progress:  Due to limited staffing, group was not held on the unit.  ? ? ?Therapeutic Modalities ?Cognitive Behavioral Therapy ?Motivational Interviewing ? ?Shakeya Kerkman K Ratasha Fabre, LCSWA ?05/05/2021  4:08 PM   ?

## 2021-05-05 NOTE — Progress Notes (Signed)
Patient continues pacing in and out of his room and playing with water in his bathroom. Medications not yet effective. Will continue to redirect Patient and Support and encouragement provided.  ?

## 2021-05-05 NOTE — Plan of Care (Signed)
D: Pt alert and when asked in a yes or no fashion patient is able to endorse no he is not experiencing any anxiety/depression at this time. It was reported that the patient was up all night sleeping only for 5-15 minutes at a time. Pt says no to experiencing any pain at this time. Pt says no to experiencing any SI/HI but yes AVH at this time.  ?  ?Pt has had at multiple incontinent episode during this shift. Pt is difficult to understand most of the time and needs set up/assistance for meals. Pt wonders unit and is confused most of the time. Pt is redirectable however spends most of the day being directed/redirected by staff. Pt has gone into multiple patient rooms that are not his. When in other rooms pt goes directly into their shower drenching himself while holding foot over the drain or is shoving his hand in the toilet. Pt will also take the whole rolls of toilet paper, his scrubs or linens and shove them in the toilet trying to flush them. ? ?Pt had to placed on 1:1 d/t his inappropriate behaviors; going into others rooms, wear others scrubs, placing items in the toilet that doesn't belong, standing in the shower w/ft over drain, getting naked inappropriately, pushing starr buttons... ?  ?Pt made the statement  again of wanting to go home.   ?  ?A: Scheduled medications administered to pt, per MD orders. Support and encouragement provided. Frequent verbal contact made. Routine safety checks conducted q15 minutes.  ?  ?R: No adverse drug reactions noted. Pt verbally contracts for safety at this time. Pt compliant with medications and treatment plan. Pt attempts interacts with others on the unit but communicates poorly and needs assistance with eating. Pt remains safe at this time. Will continue to monitor.  ?  ? ?Problem: Activity: ?Goal: Risk for activity intolerance will decrease ?Outcome: Progressing ?  ?Problem: Nutrition: ?Goal: Adequate nutrition will be maintained ?Outcome: Progressing ?  ?

## 2021-05-05 NOTE — Progress Notes (Signed)
Patient pacing and fidgeting  in his room sleeping for a few minutes and wakes up, continue to go in and out of his room removing bedding from his bed soaking it in water in the commode. Continue to talking to himself  with episodes of loud yelling. Prn medications given were not effective.  ?
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
## 2021-05-06 DIAGNOSIS — I10 Essential (primary) hypertension: Secondary | ICD-10-CM

## 2021-05-06 DIAGNOSIS — F251 Schizoaffective disorder, depressive type: Secondary | ICD-10-CM

## 2021-05-06 MED ORDER — LITHIUM CARBONATE ER 300 MG PO TBCR
300.0000 mg | EXTENDED_RELEASE_TABLET | ORAL | Status: DC
  Administered 2021-05-07 – 2021-05-10 (×7): 300 mg via ORAL
  Filled 2021-05-06 (×7): qty 1

## 2021-05-06 MED ORDER — DIPHENHYDRAMINE HCL 25 MG PO CAPS
25.0000 mg | ORAL_CAPSULE | Freq: Every evening | ORAL | Status: DC | PRN
  Administered 2021-05-06 – 2021-05-07 (×2): 25 mg via ORAL
  Filled 2021-05-06 (×2): qty 1

## 2021-05-06 MED ORDER — QUETIAPINE FUMARATE 200 MG PO TABS
200.0000 mg | ORAL_TABLET | Freq: Every day | ORAL | Status: DC
Start: 2021-05-06 — End: 2021-05-10
  Administered 2021-05-06 – 2021-05-09 (×4): 200 mg via ORAL
  Filled 2021-05-06 (×4): qty 1

## 2021-05-06 MED ORDER — DICYCLOMINE HCL 20 MG PO TABS: 20.0000 mg | ORAL_TABLET | Freq: Every evening | ORAL | Status: DC | PRN

## 2021-05-06 MED ORDER — OLANZAPINE 10 MG PO TABS
10.0000 mg | ORAL_TABLET | Freq: Every day | ORAL | Status: DC
Start: 2021-05-07 — End: 2021-05-10
  Administered 2021-05-07 – 2021-05-10 (×4): 10 mg via ORAL
  Filled 2021-05-06 (×4): qty 1

## 2021-05-06 NOTE — Progress Notes (Signed)
08:00  Patient had breakfast. Compliant with medications. Patient denies SI,HI and AVH at this time. Patient loud at times but redirectable. Safety maintained with sitter. ? ?09:00 Patient appropriate with 1:1 sitter ? ?10:00  Patient had shower. Patient stays in room with 1:1 sitter. ? ?11;00 Patient resting in bed. States that he is tired. Safety maintained with sitter. ?

## 2021-05-06 NOTE — Progress Notes (Signed)
Patient is awake and remains on 1:1 with a sitter present. Pt is redirectable and staying in his room. Pt remains safe on the unit.  ?

## 2021-05-06 NOTE — Progress Notes (Signed)
Pt is asleep and remains on 1:1 for safety. Sitter is present. Pt remains safe on the unit.  ?

## 2021-05-06 NOTE — Progress Notes (Signed)
12:00 Patient had lunch. Appropriate in the day room. Sitter with patient. ? ?13:00  Patient resting in bed. Sitter at bedside. ? ?14:00  Patient outside in the courtyard. Sitter with patient. ? ?15:00  Patient outside. Appropriate with staff & peers. Sitter with patient. ? ?17:00 Patient had dinner. Appropriate with staff & peers. Sitter with patient. ?

## 2021-05-06 NOTE — Progress Notes (Signed)
Patient on 1:1 for safety with a sitter present. Pt compliant with medication administration per MD orders. Pt given support. Pt observed interacting well while on the 1:1. Pt remains safe on the unit.  ?

## 2021-05-06 NOTE — Group Note (Signed)
[facility] LCSW Group Therapy Note ? ? ? ?Group Date: 05/06/2021 ?Start Time: 1300 ?End Time: 1400 ? ?Type of Therapy and Topic:  Group Therapy:  Overcoming Obstacles ? ?Participation Level:  [facility] PARTICIPATION LEVEL: Did Not Attend ? ?Mood: ? ?Description of Group:   ?In this group patients will be encouraged to explore what they see as obstacles to their own wellness and recovery. They will be guided to discuss their thoughts, feelings, and behaviors related to these obstacles. The group will process together ways to cope with barriers, with attention given to specific choices patients can make. Each patient will be challenged to identify changes they are motivated to make in order to overcome their obstacles. This group will be process-oriented, with patients participating in exploration of their own experiences as well as giving and receiving support and challenge from other group members. ? ?Therapeutic Goals: ?1. Patient will identify personal and current obstacles as they relate to admission. ?2. Patient will identify barriers that currently interfere with their wellness or overcoming obstacles.  ?3. Patient will identify feelings, thought process and behaviors related to these barriers. ?4. Patient will identify two changes they are willing to make to overcome these obstacles:  ? ? ?Summary of Patient Progress ? ? ?CSW offered group, patient chose not to attend.  ? ? ?Therapeutic Modalities:   ?Cognitive Behavioral Therapy ?Solution Focused Therapy ?Motivational Interviewing ?Relapse Prevention Therapy ? ? ?Weslyn Holsonback J Zaccheaus Storlie, LCSW ?
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
## 2021-05-06 NOTE — Plan of Care (Signed)
Patient in & out of room with sitter. Patient loud and irritable with one of the peers at times but redirectable. Otherwise patient appropriate with sitter. No incontinent episode noted. Denies SI,HI and AVH. ADLs maintained with no assistance. Appetite and energy level good. Support and encouragement given. ?

## 2021-05-06 NOTE — Progress Notes (Signed)
Pt is asleep with a sitter present. Pt remains safe on the unit.  ?

## 2021-05-07 DIAGNOSIS — F259 Schizoaffective disorder, unspecified: Secondary | ICD-10-CM | POA: Diagnosis not present

## 2021-05-07 NOTE — Progress Notes (Signed)
Patient on 1:1 with a sitter present. Pt is awake and without concern. Pt remains safe on the unit ?

## 2021-05-07 NOTE — Progress Notes (Signed)
Patient was asleep when a code was called on the Hospital overhead intercom. Pt woke up agitated. Pt given education. pt remains on 1:1 with a sitter present  ?

## 2021-05-07 NOTE — Progress Notes (Signed)
Discontinued 1:1 Patient Nursing Note ? ?1700: Patient is in the dayroom, calm and composed at this time, without any issues. Patient remains safe on the unit. ? ?1900: Patient is in the dayroom. Patient remains safe on the unit. ?
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
## 2021-05-07 NOTE — Plan of Care (Signed)
D- Patient alert and oriented to person and situation. Patient presented in a preoccupied, but pleasant mood on assessment and it was reported that patient did not sleep at all last night. Patient stated to this writer that he kept "going to sleep and waking up". Patient endorsed toe pain, stating that he needs his shoes. Patient rated his pain level a "10/10", in which he did request PRN pain medication from this writer, in which Tylenol was given. Patient denied SI, HI, AVH at this time. Patient also denied any signs/symptoms of depression and anxiety. Patient had no stated goals for today. ? ?A- Scheduled medications administered to patient, per MD orders. Support and encouragement provided.  Routine safety checks conducted every 15 minutes.  Patient informed to notify staff with problems or concerns. ? ?R- No adverse drug reactions noted. Patient contracts for safety at this time. Patient compliant with medications and treatment plan. Patient receptive, calm, and cooperative. Patient interacts well with others on the unit. Patient remains safe at this time. ? ?Problem: Education: ?Goal: Knowledge of General Education information will improve ?Description: Including pain rating scale, medication(s)/side effects and non-pharmacologic comfort measures ?Outcome: Not Progressing ?  ?Problem: Health Behavior/Discharge Planning: ?Goal: Ability to manage health-related needs will improve ?Outcome: Not Progressing ?  ?Problem: Clinical Measurements: ?Goal: Ability to maintain clinical measurements within normal limits will improve ?Outcome: Not Progressing ?Goal: Will remain free from infection ?Outcome: Not Progressing ?Goal: Diagnostic test results will improve ?Outcome: Not Progressing ?Goal: Respiratory complications will improve ?Outcome: Not Progressing ?Goal: Cardiovascular complication will be avoided ?Outcome: Not Progressing ?  ?Problem: Activity: ?Goal: Risk for activity intolerance will decrease ?Outcome: Not  Progressing ?  ?Problem: Nutrition: ?Goal: Adequate nutrition will be maintained ?Outcome: Not Progressing ?  ?Problem: Coping: ?Goal: Level of anxiety will decrease ?Outcome: Not Progressing ?  ?Problem: Elimination: ?Goal: Will not experience complications related to bowel motility ?Outcome: Not Progressing ?Goal: Will not experience complications related to urinary retention ?Outcome: Not Progressing ?  ?Problem: Pain Managment: ?Goal: General experience of comfort will improve ?Outcome: Not Progressing ?  ?Problem: Safety: ?Goal: Ability to remain free from injury will improve ?Outcome: Not Progressing ?  ?Problem: Skin Integrity: ?Goal: Risk for impaired skin integrity will decrease ?Outcome: Not Progressing ?  ?Problem: Activity: ?Goal: Will verbalize the importance of balancing activity with adequate rest periods ?Outcome: Not Progressing ?  ?Problem: Education: ?Goal: Will be free of psychotic symptoms ?Outcome: Not Progressing ?Goal: Knowledge of the prescribed therapeutic regimen will improve ?Outcome: Not Progressing ?  ?Problem: Coping: ?Goal: Coping ability will improve ?Outcome: Not Progressing ?Goal: Will verbalize feelings ?Outcome: Not Progressing ?  ?Problem: Health Behavior/Discharge Planning: ?Goal: Compliance with prescribed medication regimen will improve ?Outcome: Not Progressing ?  ?Problem: Nutritional: ?Goal: Ability to achieve adequate nutritional intake will improve ?Outcome: Not Progressing ?  ?Problem: Role Relationship: ?Goal: Ability to communicate needs accurately will improve ?Outcome: Not Progressing ?Goal: Ability to interact with others will improve ?Outcome: Not Progressing ?  ?Problem: Safety: ?Goal: Ability to redirect hostility and anger into socially appropriate behaviors will improve ?Outcome: Not Progressing ?Goal: Ability to remain free from injury will improve ?Outcome: Not Progressing ?  ?Problem: Self-Care: ?Goal: Ability to participate in self-care as condition  permits will improve ?Outcome: Not Progressing ?  ?Problem: Self-Concept: ?Goal: Will verbalize positive feelings about self ?Outcome: Not Progressing ?  ?Problem: Activity: ?Goal: Will verbalize the importance of balancing activity with adequate rest periods ?Outcome: Not Progressing ?  ?Problem: Education: ?Goal:  Will be free of psychotic symptoms ?Outcome: Not Progressing ?Goal: Knowledge of the prescribed therapeutic regimen will improve ?Outcome: Not Progressing ?  ?Problem: Coping: ?Goal: Coping ability will improve ?Outcome: Not Progressing ?Goal: Will verbalize feelings ?Outcome: Not Progressing ?  ?Problem: Health Behavior/Discharge Planning: ?Goal: Compliance with prescribed medication regimen will improve ?Outcome: Not Progressing ?  ?Problem: Nutritional: ?Goal: Ability to achieve adequate nutritional intake will improve ?Outcome: Not Progressing ?  ?Problem: Role Relationship: ?Goal: Ability to communicate needs accurately will improve ?Outcome: Not Progressing ?Goal: Ability to interact with others will improve ?Outcome: Not Progressing ?  ?Problem: Safety: ?Goal: Ability to redirect hostility and anger into socially appropriate behaviors will improve ?Outcome: Not Progressing ?Goal: Ability to remain free from injury will improve ?Outcome: Not Progressing ?  ?Problem: Self-Care: ?Goal: Ability to participate in self-care as condition permits will improve ?Outcome: Not Progressing ?  ?Problem: Self-Concept: ?Goal: Will verbalize positive feelings about self ?Outcome: Not Progressing ?  ?

## 2021-05-07 NOTE — [facility] Counselor (Signed)
CSW spoke with [name], [phone] for additional information on patient. ? ?CSW received verbal permission from guardian prior to call. ? ?[name] reports that the patient "approached me saying 'hi dad where are my kids' which is totally out of his character".  He reports that the patient had his medication altered by a doctor and that changed patient's baseline behaviors. ? ?He reports that patient needed "Zyprexa" to return to baseline. ? ?He reports that if patient names "Ledell Noss and Janine Limbo as his children he is not right or if he says his parents are alive, he is not right".  ? ?He reports that the patient speaks clearly and does not mumble when he does speak.  He reports that he is a "yes,no answerer, he sits in one room and watches TV". ? ?He reports that he plans on visiting the patient. He reports that he has known the patient for 8 years. ? ?[name], MSW, LCSW ?05/07/2021 1:30 PM  ?

## 2021-05-07 NOTE — Group Note (Signed)
[facility] LCSW Group Therapy Note ? ? ? ?Group Date: 05/07/2021 ?Start Time: 1300 ?End Time: 1400 ? ?Type of Therapy and Topic:  Group Therapy:  Overcoming Obstacles ? ?Participation Level:  [facility] PARTICIPATION LEVEL: None ? ?Description of Group:   ?In this group patients will be encouraged to explore what they see as obstacles to their own wellness and recovery. They will be guided to discuss their thoughts, feelings, and behaviors related to these obstacles. The group will process together ways to cope with barriers, with attention given to specific choices patients can make. Each patient will be challenged to identify changes they are motivated to make in order to overcome their obstacles. This group will be process-oriented, with patients participating in exploration of their own experiences as well as giving and receiving support and challenge from other group members. ? ?Therapeutic Goals: ?1. Patient will identify personal and current obstacles as they relate to admission. ?2. Patient will identify barriers that currently interfere with their wellness or overcoming obstacles.  ?3. Patient will identify feelings, thought process and behaviors related to these barriers. ?4. Patient will identify two changes they are willing to make to overcome these obstacles:  ? ? ?Summary of Patient Progress ?Paitent did not participate in any manner, remained in the back of the room responding to internal stimuli throughout group. Patient was eventually asked to leave once his behaviors became disruptive.  ? ? ?Therapeutic Modalities:   ?Cognitive Behavioral Therapy ?Solution Focused Therapy ?Motivational Interviewing ?Relapse Prevention Therapy ? ? ?Durenda Hurt, LCSWA ?

## 2021-05-07 NOTE — [facility] Suicide Risk Assessment (Signed)
[facility] INPATIENT:  Family/Significant Other Suicide Prevention Education ? ?Suicide Prevention Education:  ?Education Completed; [name], [facility] [place], [phone] has been identified by the patient as the family member/significant other with whom the patient will be residing, and identified as the person(s) who will aid the patient in the event of a mental health crisis (suicidal ideations/suicide attempt).  With written consent from the patient, the family member/significant other has been provided the following suicide prevention education, prior to the and/or following the discharge of the patient. ? ?The suicide prevention education provided includes the following: ?Suicide risk factors ?Suicide prevention and interventions ?National Suicide Hotline telephone number ?[facility] assessment telephone number ?[facility] Emergency Assistance 911 ?[place] and/or Residential Mobile Crisis Unit telephone number ? ?Request made of family/significant other to: ?Remove weapons (e.g., guns, rifles, knives), all items previously/currently identified as safety concern.   ?Remove drugs/medications (over-the-counter, prescriptions, illicit drugs), all items previously/currently identified as a safety concern. ? ?The family member/significant other verbalizes understanding of the suicide prevention education information provided.  The family member/significant other agrees to remove the items of safety concern listed above. ? ?She reports that the patient is new to her caseload. She reports that the patient experienced "some sort of medication switch up and hospital was the only way that he could get the medication.   ? ?She requested CSW contact [name] for additional information.  ? ?[name] ?05/07/2021, 1:12 PM ?

## 2021-05-07 NOTE — Progress Notes (Signed)
Recreation Therapy Notes ? ? ?Date: 05/07/2021 ? ?Time: 10:35 am  ? ?Location: Courtyard   ? ?Behavioral response: Appropriate ? ?Intervention Topic: Leisure   ? ?Discussion/Intervention:  ?Group content today was focused on leisure. The group defined what leisure is and some positive leisure activities they participate in. Individuals identified the difference between good and bad leisure. Participants expressed how they feel after participating in the leisure of their choice. The group discussed how they go about picking a leisure activity and if others are involved in their leisure activities. The patient stated how many leisure activities they have to choose from and reasons why it is important to have leisure time. Individuals participated in the intervention ?Exploration of Leisure? where they had a chance to identify new leisure activities as well as benefits of leisure. ?Clinical Observations/Feedback: ?Patient came to group and was able to focus on the task at hand while learning new leisure skills.  Individual was social with peers and staff while participating in the intervention.  ?Robson Trickey LRT/CTRS  ? ? ? ? ? ? ? ? ? ? ?[name] ?05/07/2021 1:06 PM ?

## 2021-05-07 NOTE — Progress Notes (Signed)
Patient was in the medication room, receiving his scheduled medications, when he stated "I go home tomorrow, how am I gonna get my shoes". This Clinical research associate stated to patient that whatever belongings he had on admission, he would receive out of his locker at discharge. Patient verbalized understanding and stated "where am I going". This Clinical research associate explained to patient that if he came from a group home, that he would more than likely go back there. Patient asked if he could go somewhere else. This Clinical research associate asked patient why he wanted to go somewhere else, he stated "to be closer to town", he stated that he wanted to go to an apartment on [address]. This Clinical research associate stated to patient that this may not be possible. Patient proceeds to take his medication and tolerated it well, without any issues. Patient remains safe on the unit. ?

## 2021-05-07 NOTE — Progress Notes (Signed)
Upon doing rounds, patient was found in another patient's room, in the shower. Patient has been redirected to where his room is, to finish his shower and change his clothes. ?

## 2021-05-07 NOTE — Progress Notes (Signed)
1:1 Patient Hourly Rounding ? ?0800: Patient is in his room, talking to his assigned safety sitter. ? ?0900: Patient is in the dayroom, watching television and talking to another member on the unit, with his assigned safety sitter present. ? ?1000: Patient is in his room, making up his bed, with his assigned safety sitter present. ? ?1100: Patient is outside, in the courtyard with staff, other members on the unit, and his assigned safety sitter present. ? ?1200: Patient is in the dayroom, eating lunch, with his assigned safety sitter present. ? ?1300: Patient is in the dayroom, with his assigned safety sitter present. ? ?1400: Patient is in his room, calm and composed, with his assigned safety sitter present. ? ?1500: Patient is in the dayroom, watching television, with his assigned safety sitter present. ? ?1515: 1:1 Patient discontinued by provider. ?

## 2021-05-08 DIAGNOSIS — F259 Schizoaffective disorder, unspecified: Secondary | ICD-10-CM | POA: Diagnosis not present

## 2021-05-08 MED ORDER — RISPERIDONE 1 MG PO TBDP: 1.0000 mg | ORAL_TABLET | Freq: Two times a day (BID) | ORAL | 1 refills | Status: DC | PRN

## 2021-05-08 MED ORDER — OLANZAPINE 10 MG PO TABS: 10.0000 mg | ORAL_TABLET | Freq: Every day | ORAL | 1 refills | Status: DC

## 2021-05-08 MED ORDER — LOSARTAN POTASSIUM 50 MG PO TABS: 50.0000 mg | ORAL_TABLET | Freq: Every day | ORAL | 1 refills | Status: DC

## 2021-05-08 MED ORDER — LITHIUM CARBONATE ER 300 MG PO TBCR: 300.0000 mg | EXTENDED_RELEASE_TABLET | Freq: Three times a day (TID) | ORAL | 1 refills | Status: DC

## 2021-05-08 MED ORDER — FLUTICASONE FUROATE-VILANTEROL 200-25 MCG/ACT IN AEPB: 1.0000 | INHALATION_SPRAY | Freq: Every day | RESPIRATORY_TRACT | 1 refills | Status: DC

## 2021-05-08 MED ORDER — ATORVASTATIN CALCIUM 40 MG PO TABS: 40.0000 mg | ORAL_TABLET | Freq: Every day | ORAL | 1 refills | Status: DC

## 2021-05-08 MED ORDER — QUETIAPINE FUMARATE 200 MG PO TABS: 200.0000 mg | ORAL_TABLET | Freq: Every day | ORAL | 1 refills | Status: DC

## 2021-05-08 MED ORDER — AMLODIPINE BESYLATE 5 MG PO TABS: 5.0000 mg | ORAL_TABLET | Freq: Every day | ORAL | 1 refills | Status: DC

## 2021-05-08 NOTE — Progress Notes (Signed)
Recreation Therapy Notes ? ?Date: 05/08/2021 ? ?Time: 10:40 am   ? ?Location: Courtyard ? ?Behavioral response: Appropriate ? ?Intervention Topic: Communication    ? ?Discussion/Intervention:  ?Group content today was focused on communication. The group defined communication and ways to communicate with others. Individuals stated reason why communication is important and some reasons to communicate with others. Patients expressed if they thought they were good at communicating with others and ways they could improve their communication skills. The group identified important parts of communication and some experiences they have had in the past with communication. The group participated in the intervention ?What is that??, where they had a chance to test out their communication skills and identify ways to improve their communication techniques.  ?Clinical Observations/Feedback: ?Patient came to group and was able to identify and participate in different types of communication. Individual was social with peers and staff while participating in the intervention.  ?[name] LRT/CTRS  ? ? ? ? ? ? ? ? ? ? ?[name] ?05/08/2021 12:02 PM ?

## 2021-05-08 NOTE — [facility] Counselor (Signed)
CSW received word from the patient's group home that they would like CSW to try and schedule patient with [facility]. ? ?[name], MSW, LCSW ?05/08/2021 12:53 PM  ?

## 2021-05-08 NOTE — Progress Notes (Signed)
Patient verbalized understanding discharge instruction and prescriptions. Patient denies SI,HI and AVH. Patient calm and cooperative with minimal redirections needed today. Waiting for ride. ?

## 2021-05-08 NOTE — Progress Notes (Signed)
[facility] LCSW Progress Note ?  ?05/08/2021    4:26 PM ? ?[name]  ? ?[id number]  ? ?Type of Contact and Topic:  Care Coordination  ? ?CSW made multiple attempts to reach [facility], outpatient mental health clinic, in order to schedule appointment. Outpatient clinic contact information included in AVS for group home to reach once patient is discharged.  ? ?  ?Signed:  ?Corky Crafts, MSW, LCSWA, LCAS ?05/08/2021 4:26 PM ?  ?  ?

## 2021-05-08 NOTE — [facility] Suicide Risk Assessment (Signed)
[facility] Discharge Suicide Risk Assessment ? ? ?Principal Problem: Schizoaffective disorder (HCC) ?Discharge Diagnoses: Principal Problem: ?  Schizoaffective disorder (HCC) ?Active Problems: ?  Hypertension ? ? ?Total Time spent with patient: 30 minutes ? ?Musculoskeletal: ?Strength & Muscle Tone: within normal limits ?Gait & Station: normal ?Patient leans: N/A ? ?Psychiatric Specialty Exam ? ?Presentation  ?General Appearance: Casual ? ?Eye Contact:Good ? ?Speech:Clear and Coherent ? ?Speech Volume:Normal ? ?Handedness:No data recorded ? ?Mood and Affect  ?Mood:Anxious (stated "I feel like  I need medicine") ? ?Duration of Depression Symptoms: No data recorded ?Affect:Congruent ? ? ?Thought Process  ?Thought Processes:Coherent ? ?Descriptions of Associations:Loose ? ?Orientation:Partial ? ?Thought Content:Scattered ? ?History of Schizophrenia/Schizoaffective disorder:Yes ? ?Duration of Psychotic Symptoms:Greater than six months ? ?Hallucinations:No data recorded ?Ideas of Reference:Delusions ? ?Suicidal Thoughts:No data recorded ?Homicidal Thoughts:No data recorded ? ?Sensorium  ?Memory:Immediate Poor ? ?Judgment:Impaired ? ?Insight:Poor ? ? ?Executive Functions  ?Concentration:Poor ? ?Attention Span:Fair ? ?Recall:Fair ? ?Fund of Knowledge:Poor ? ?Language:Poor ? ? ?Psychomotor Activity  ?Psychomotor Activity:No data recorded ? ?Assets  ?Assets:Financial Resources/Insurance; Housing; Resilience; Physical Health; Social Support ? ? ?Sleep  ?Sleep:No data recorded ? ?Physical Exam: ?Physical Exam ?Vitals and nursing note reviewed.  ?Constitutional:   ?   Appearance: Normal appearance.  ?HENT:  ?   Head: Normocephalic and atraumatic.  ?   Mouth/Throat:  ?   Pharynx: Oropharynx is clear.  ?Eyes:  ?   Pupils: Pupils are equal, round, and reactive to light.  ?Cardiovascular:  ?   Rate and Rhythm: Normal rate and regular rhythm.  ?Pulmonary:  ?   Effort: Pulmonary effort is normal.  ?   Breath sounds: Normal breath sounds.   ?Abdominal:  ?   General: Abdomen is flat.  ?   Palpations: Abdomen is soft.  ?Musculoskeletal:     ?   General: Normal range of motion.  ?Skin: ?   General: Skin is warm and dry.  ?Neurological:  ?   General: No focal deficit present.  ?   Mental Status: He is alert. Mental status is at baseline.  ?Psychiatric:     ?   Attention and Perception: Attention normal.     ?   Mood and Affect: Mood normal. Affect is blunt.     ?   Speech: Speech normal.     ?   Behavior: Behavior normal.     ?   Thought Content: Thought content normal.     ?   Cognition and Memory: Cognition normal.     ?   Judgment: Judgment normal.  ? ?Review of Systems  ?Constitutional: Negative.   ?HENT: Negative.    ?Eyes: Negative.   ?Respiratory: Negative.    ?Cardiovascular: Negative.   ?Gastrointestinal: Negative.   ?Musculoskeletal: Negative.   ?Skin: Negative.   ?Neurological: Negative.   ?Psychiatric/Behavioral: Negative.    ?Blood pressure 127/71, pulse 71, temperature 98.8 ?F (37.1 ?C), temperature source Oral, resp. rate 18, height 5' 6.93" (1.7 m), weight 64 kg, SpO2 95 %. Body mass index is 22.13 kg/m?. ? ?Mental Status Per Nursing Assessment::   ?On Admission:  Plan to harm others ? ?Demographic Factors:  ?Male and Low socioeconomic status ? ?Loss Factors: ?NA ? ?Historical Factors: ?Impulsivity ? ?Risk Reduction Factors:   ?Living with another person, especially a relative and Positive therapeutic relationship ? ?Continued Clinical Symptoms:  ?Schizophrenia:   Paranoid or undifferentiated type ? ?Cognitive Features That Contribute To Risk:  ?None   ? ?Suicide Risk:  ?Minimal: No  identifiable suicidal ideation.  Patients presenting with no risk factors but with morbid ruminations; may be classified as minimal risk based on the severity of the depressive symptoms ? ? Follow-up Information   ? ? [facility].   ?Contact information: ?[address] ?[place] [address] ?[phone] ? ? ?  ?  ? ?  ?  ? ?   ? ? ?Plan Of Care/Follow-up recommendations:  ?Continue current medicine.  Follow-up with regular mental health provider.  Avoid intoxicating drugs.  Maintain a good sleep schedule.  Patient denies any suicidal ideation and has shown no dangerous behavior in the hospital ? ?Mordecai Rasmussen, MD ?05/08/2021, 12:10 PM ?

## 2021-05-08 NOTE — Progress Notes (Signed)
Patient is pleasant at times confused with sometimes incoherent speech. He is redirectable and does so without issue.  He is med compliant and receives medication without issue.  He denies si hi avh anxiety and depression.  Will continue to monitor with q 15 minute safety rounds.  ? ? ?C Butler-Nicholson, LPN ?

## 2021-05-08 NOTE — [place] FL2 (Signed)
?[place] MEDICAID FL2 LEVEL OF CARE SCREENING TOOL  ?  ? ?IDENTIFICATION  ?Patient Name: ?[name] Birthdate: [birth year] Sex: male Admission Date (Current Location): ?05/02/2021  ?[place] Number: ? [place] ?  Facility and Address:  ?[facility], [address], [place], [place] [address] ?     Provider Number: ?[id number]  ?Attending Physician Name and Address:  ?Clapacs, Jackquline Denmark, MD ? Relative Name and Phone Number:  ?  ?   ?Current Level of Care: ?Hospital Recommended Level of Care: ?Assisted Living Facility, [facility], Other (Comment) (Group Home) Prior Approval Number: ?  ? ?Date Approved/Denied: ?  PASRR Number: ?  ? ?Discharge Plan: ?Other (Comment) (ALF, [facility], Group Home) ?  ? ?Current Diagnoses: ?Patient Active Problem List  ? Diagnosis Date Noted  ? Schizoaffective disorder (HCC) 05/02/2021  ? Schizoaffective disorder, bipolar type (HCC) 06/15/2016  ? Hypertension 06/13/2016  ? Tobacco use disorder 10/04/2015  ? Noncompliance 10/03/2015  ? ? ?Orientation RESPIRATION BLADDER Height & Weight   ?  ?Self, Place, Time, Situation ? Normal Continent Weight: 141 lb (64 kg) ?Height:  5' 6.93" (170 cm)  ?BEHAVIORAL SYMPTOMS/MOOD NEUROLOGICAL BOWEL NUTRITION STATUS  ? (NA)  (NA) Continent  (NA)  ?AMBULATORY STATUS COMMUNICATION OF NEEDS Skin   ?Independent Verbally Normal ?  ?  ?  ?    ?     ?     ? ? ?Personal Care Assistance Level of Assistance  ? (NA)   ?  ?  ?   ? ?Functional Limitations Info  ? (NA)   ?  ?   ? ? ?SPECIAL CARE FACTORS FREQUENCY  ? (NA)   ?  ?  ?  ?  ?  ?  ?   ? ? ?Contractures Contractures Info: Not present  ? ? ?Additional Factors Info  ? (Hypertension) Code Status Info: Full ?Allergies Info: Haldol (haloperidol) ?  ?  ?  ?   ? ?Current Medications (05/08/2021):  This is the current hospital active medication list ?Current Facility-Administered Medications  ?Medication Dose Route Frequency Provider Last Rate Last Admin  ? acetaminophen (TYLENOL)  tablet 650 mg  650 mg Oral Q6H PRN Vanetta Mulders, NP   650 mg at 05/07/21 1572  ? alum & mag hydroxide-simeth (MAALOX/MYLANTA) 200-200-20 MG/5ML suspension 30 mL  30 mL Oral Q4H PRN Gabriel Cirri F, NP      ? amLODipine (NORVASC) tablet 5 mg  5 mg Oral Daily Gabriel Cirri F, NP   5 mg at 05/08/21 6203  ? atorvastatin (LIPITOR) tablet 40 mg  40 mg Oral QHS Vanetta Mulders, NP   40 mg at 05/07/21 2109  ? diphenhydrAMINE (BENADRYL) capsule 25 mg  25 mg Oral QHS PRN Gillermo Murdoch, NP   25 mg at 05/07/21 2109  ? fluticasone furoate-vilanterol (BREO ELLIPTA) 200-25 MCG/ACT 1 puff  1 puff Inhalation Daily Gabriel Cirri F, NP   1 puff at 05/08/21 0900  ? lithium carbonate (LITHOBID) CR tablet 300 mg  300 mg Oral QHS Gabriel Cirri F, NP   300 mg at 05/07/21 2109  ? lithium carbonate (LITHOBID) CR tablet 300 mg  300 mg Oral [id number] Sarina Ill, DO   300 mg at 05/08/21 5597  ? losartan (COZAAR) tablet 50 mg  50 mg Oral Daily Gabriel Cirri F, NP   50 mg at 05/08/21 0818  ? magnesium hydroxide (MILK OF MAGNESIA) suspension 30 mL  30 mL Oral Daily PRN  Vanetta Mulders, NP      ? OLANZapine [facility]) tablet 10 mg  10 mg Oral Daily Sarina Ill, DO   10 mg at 05/08/21 6720  ? QUEtiapine (SEROQUEL) tablet 200 mg  200 mg Oral QHS Sarina Ill, DO   200 mg at 05/07/21 2109  ? risperiDONE (RISPERDAL M-TABS) disintegrating tablet 1 mg  1 mg Oral BID PRN Vanetta Mulders, NP   1 mg at 05/05/21 2103  ? ? ? ?Discharge Medications: ?Please see discharge summary for a list of discharge medications. ? ?Relevant Imaging Results: ? ?Relevant Lab Results: ? ? ?Additional Information ?Jess Barters, [employer]hip, legal guardian, [phone] ? ?Harden Mo, LCSW ? ? ? ? ?

## 2021-05-08 NOTE — Progress Notes (Signed)
Patient active on the unit. Walking the halls and hanging in the dayroom. Questioning if [name] is coming to get him tonight and if he is going to be able to go to the [facility] program tomorrow. He was receptive and verbalized understanding that due to the lateness of the day it was unlikely that he would be leaving tonight.  He is med compliant this evening and received his meds without incident. He denies si  hi avh depression and anxiety at this encounter.  Will continue to monitor with q 15 minute safety checks.   ? ? ? ? ? ?C Butler-Nicholson, LPN ?

## 2021-05-08 NOTE — Progress Notes (Signed)
Patient active on the unit very restless this evening.  Walking around the unit.  Has to be redirected several times to where his room is.  He is med compliant and takes his meds without incident.  He denies si  hi avh depression and anxiety at this encounter.  Will continue to monitor with q 15 minute safety rounds.   ? ? ? ? ?C Butler-Nicholson, LPN ?

## 2021-05-08 NOTE — Group Note (Signed)
[facility] LCSW Group Therapy Note ? ? ?Group Date: 05/08/2021 ?Start Time: 1300 ?End Time: 1400 ? ? ?Type of Therapy/Topic:  Group Therapy:  Emotion Regulation ? ?Participation Level:  None  ? ?Mood: ? ?Description of Group:   ? The purpose of this group is to assist patients in learning to regulate negative emotions and experience positive emotions. Patients will be guided to discuss ways in which they have been vulnerable to their negative emotions. These vulnerabilities will be juxtaposed with experiences of positive emotions or situations, and patients challenged to use positive emotions to combat negative ones. Special emphasis will be placed on coping with negative emotions in conflict situations, and patients will process healthy conflict resolution skills. ? ?Therapeutic Goals: ?Patient will identify two positive emotions or experiences to reflect on in order to balance out negative emotions:  ?Patient will label two or more emotions that they find the most difficult to experience:  ?Patient will be able to demonstrate positive conflict resolution skills through discussion or role plays:  ? ?Summary of Patient Progress: ?Patient was present in group.  Patient did not engage in group with the exception of asking group leader "what county am I in?" ? ?Therapeutic Modalities:   ?Cognitive Behavioral Therapy ?Feelings Identification ?Dialectical Behavioral Therapy ? ? ?Harden Mo, LCSW ?

## 2021-05-08 NOTE — Progress Notes (Addendum)
?  [facility] Adult Case Management Discharge Plan : ? ?Will you be returning to the same living situation after discharge:  Yes,  per guardian, patient is to return to his group home.  ?At discharge, do you have transportation home?: Yes,  Group home will provide transportation. ?Do you have the ability to pay for your medications: Yes,  Medicaid, Medicare Part A and B ? ?Release of information consent forms completed and in the chart;  Patient's signature needed at discharge. ? ?Patient to Follow up at: ? Follow-up Information   ? ?  ? [facility] Follow up.   ?Why: Please call to follow up. ?Contact information: ?[address] ?[place] [address] ?[phone] ? ? ?  ?  ? ?  ?  ? ?  ? ? ? ?  ?  ? ?  ?  ? ?  ? ? ?Next level of care provider has access to [facility] Link:no ? ?Safety Planning and Suicide Prevention discussed: Yes,  SPE completed with the patient's legal guardian. ? ?  ? ?Has patient been referred to the Quitline?: Patient refused referral ? ?Patient has been referred for addiction treatment: Pt. refused referral ? ?Harden Mo, LCSW ?05/08/2021, 12:44 PM ?
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
## 2021-05-08 NOTE — [facility] Counselor (Signed)
CSW emailed the Medicare Rights form to the patient's guardian. ? ?CSW again requested guardianship documentation. ? ?[name], MSW, LCSW ?05/08/2021 12:30 PM  ?

## 2021-05-08 NOTE — [facility] Counselor (Signed)
CSW contacted patient's group home [facility] and let him know that patient was planned for discharge today.  CSW provided update with [facility] assistance.  Updated report included patient is sleeping, taking medications, not wandering, speaking clearly, no longer clogging drains. ? ?He reports that he would like to come by at 5PM to check on the patient and if patient is doing well then he will take the patient home. ? ?CSW reports that she will update the [facility] and provide to the staff for the interview as she will have left for the day. ? ?Assunta Curtis, MSW, LCSW ?05/08/2021 10:44 AM  ?

## 2021-05-08 NOTE — BH IP Treatment Plan (Signed)
Interdisciplinary Treatment and Diagnostic Plan Update ? ?05/08/2021 ?Time of Session: 8:30AM ?Dustin [name] ?MRN: [id number] ? ?Principal Diagnosis: Schizoaffective disorder (HCC) ? ?Secondary Diagnoses: Principal Problem: ?  Schizoaffective disorder (HCC) ?Active Problems: ?  Hypertension ? ? ?Current Medications:  ?Current Facility-Administered Medications  ?Medication Dose Route Frequency Provider Last Rate Last Admin  ? acetaminophen (TYLENOL) tablet 650 mg  650 mg Oral Q6H PRN Vanetta MuldersBarthold, Louise F, NP   650 mg at 05/07/21 [id number]  ? alum & mag hydroxide-simeth (MAALOX/MYLANTA) 200-200-20 MG/5ML suspension 30 mL  30 mL Oral Q4H PRN Gabriel CirriBarthold, Louise F, NP      ? amLODipine (NORVASC) tablet 5 mg  5 mg Oral Daily Gabriel CirriBarthold, Louise F, NP   5 mg at 05/08/21 [id number]  ? atorvastatin (LIPITOR) tablet 40 mg  40 mg Oral QHS Vanetta MuldersBarthold, Louise F, NP   40 mg at 05/07/21 2109  ? diphenhydrAMINE (BENADRYL) capsule 25 mg  25 mg Oral QHS PRN Gillermo Murdochhompson, Jacqueline, NP   25 mg at 05/07/21 2109  ? fluticasone furoate-vilanterol (BREO ELLIPTA) 200-25 MCG/ACT 1 puff  1 puff Inhalation Daily Gabriel CirriBarthold, Louise F, NP   1 puff at 05/07/21 [phone] [id number]  ? lithium carbonate (LITHOBID) CR tablet 300 mg  300 mg Oral QHS Gabriel CirriBarthold, Louise F, NP   300 mg at 05/07/21 2109  ? lithium carbonate (LITHOBID) CR tablet 300 mg  300 mg Oral [id number] Sarina IllHerrick, Richard Edward, DO   300 mg at 05/08/21 [id number]  ? losartan (COZAAR) tablet 50 mg  50 mg Oral Daily Gabriel CirriBarthold, Louise F, NP   50 mg at 05/08/21 0818  ? magnesium hydroxide (MILK OF MAGNESIA) suspension 30 mL  30 mL Oral Daily PRN Vanetta MuldersBarthold, Louise F, NP      ? OLANZapine (ZYPREXA) tablet 10 mg  10 mg Oral Daily Sarina IllHerrick, Richard Edward, DO   10 mg at 05/08/21 [id number]  ? QUEtiapine (SEROQUEL) tablet 200 mg  200 mg Oral QHS Sarina IllHerrick, Richard Edward, DO   200 mg at 05/07/21 2109  ? risperiDONE (RISPERDAL M-TABS) disintegrating tablet 1 mg  1 mg Oral BID PRN Vanetta MuldersBarthold, Louise F, NP   1 mg at 05/05/21 2103  ? ?PTA  Medications: ?Medications Prior to Admission  ?Medication Sig Dispense Refill Last Dose  ? amantadine (SYMMETREL) 100 MG capsule Take 100 mg by mouth 2 (two) times daily.     ? amLODipine (NORVASC) 5 MG tablet Take 1 tablet (5 mg total) by mouth daily. 30 tablet 0   ? [EXPIRED] amoxicillin (AMOXIL) 500 MG capsule Take 500 mg by mouth 3 (three) times daily.     ? atorvastatin (LIPITOR) 40 MG tablet Take 40 mg by mouth at bedtime.     ? ferrous sulfate 325 (65 FE) MG tablet Take 325 mg by mouth daily with breakfast.     ? fluticasone (FLONASE) 50 MCG/ACT nasal spray Place 2 sprays into both nostrils daily.     ? fluticasone furoate-vilanterol (BREO ELLIPTA) 200-25 MCG/ACT AEPB Inhale 1 puff into the lungs daily.     ? lithium carbonate (ESKALITH) 450 MG CR tablet Take 1 tablet (450 mg total) by mouth every 12 (twelve) hours. (Patient taking differently: Take 450 mg by mouth in the morning.) 60 tablet 0   ? lithium carbonate (LITHOBID) 300 MG CR tablet Take 300 mg by mouth at bedtime.     ? losartan (COZAAR) 50 MG tablet Take 50 mg by mouth daily.     ? montelukast (SINGULAIR) 10 MG tablet Take 10 mg  by mouth at bedtime.     ? risperiDONE (RISPERDAL M-TABS) 1 MG disintegrating tablet Take 1 mg by mouth 2 (two) times daily as needed (agitation).     ? risperiDONE ER (PERSERIS) 120 MG PRSY Inject 120 mg into the skin every 30 (thirty) days.     ? ? ?Patient Stressors:   ? ?Patient Strengths:   ? ?Treatment Modalities: Medication Management, Group therapy, Case management,  ?1 to 1 session with clinician, Psychoeducation, Recreational therapy. ? ? ?Physician Treatment Plan for Primary Diagnosis: Schizoaffective disorder (HCC) ?Long Term Goal(s): Improvement in symptoms so as ready for discharge  ? ?Short Term Goals: Ability to maintain clinical measurements within normal limits will improve ?Compliance with prescribed medications will improve ?Ability to demonstrate self-control will improve ? ?Medication Management:  Evaluate patient's response, side effects, and tolerance of medication regimen. ? ?Therapeutic Interventions: 1 to 1 sessions, Unit Group sessions and Medication administration. ? ?Evaluation of Outcomes: Progressing ? ?Physician Treatment Plan for Secondary Diagnosis: Principal Problem: ?  Schizoaffective disorder (HCC) ?Active Problems: ?  Hypertension ? ?Long Term Goal(s): Improvement in symptoms so as ready for discharge  ? ?Short Term Goals: Ability to maintain clinical measurements within normal limits will improve ?Compliance with prescribed medications will improve ?Ability to demonstrate self-control will improve    ? ?Medication Management: Evaluate patient's response, side effects, and tolerance of medication regimen. ? ?Therapeutic Interventions: 1 to 1 sessions, Unit Group sessions and Medication administration. ? ?Evaluation of Outcomes: Progressing ? ? ?RN Treatment Plan for Primary Diagnosis: Schizoaffective disorder (HCC) ?Long Term Goal(s): Knowledge of disease and therapeutic regimen to maintain health will improve ? ?Short Term Goals: Ability to demonstrate self-control, Ability to participate in decision making will improve, Ability to verbalize feelings will improve, Ability to disclose and discuss suicidal ideas, Ability to identify and develop effective coping behaviors will improve, and Compliance with prescribed medications will improve ? ?Medication Management: RN will administer medications as ordered by provider, will assess and evaluate patient's response and provide education to patient for prescribed medication. RN will report any adverse and/or side effects to prescribing provider. ? ?Therapeutic Interventions: 1 on 1 counseling sessions, Psychoeducation, Medication administration, Evaluate responses to treatment, Monitor vital signs and CBGs as ordered, Perform/monitor CIWA, COWS, AIMS and Fall Risk screenings as ordered, Perform wound care treatments as ordered. ? ?Evaluation of  Outcomes: Progressing ? ? ?LCSW Treatment Plan for Primary Diagnosis: Schizoaffective disorder (HCC) ?Long Term Goal(s): Safe transition to appropriate next level of care at discharge, Engage patient in therapeutic group addressing interpersonal concerns. ? ?Short Term Goals: Engage patient in aftercare planning with referrals and resources, Increase social support, Increase ability to appropriately verbalize feelings, Increase emotional regulation, Facilitate acceptance of mental health diagnosis and concerns, and Increase skills for wellness and recovery ? ?Therapeutic Interventions: Assess for all discharge needs, 1 to 1 time with Child psychotherapist, Explore available resources and support systems, Assess for adequacy in community support network, Educate family and significant other(s) on suicide prevention, Complete Psychosocial Assessment, Interpersonal group therapy. ? ?Evaluation of Outcomes: Progressing ? ? ?Progress in Treatment: ?Attending groups: No. ?Participating in groups: No. ?Taking medication as prescribed: Yes. ?Toleration medication: Yes. ?Family/Significant other contact made: Yes, individual(s) contacted:  SPE completed both with guardian and group home owner. ?Patient understands diagnosis: Yes. ?Discussing patient identified problems/goals with staff: Yes. ?Medical problems stabilized or resolved: Yes. ?Denies suicidal/homicidal ideation: Yes. ?Issues/concerns per patient self-inventory: No. ?Other: none ? ?New problem(s) identified: No, Describe:  none ? ?  New Short Term/Long Term Goal(s):  elimination of symptoms of psychosis, medication management for mood stabilization; elimination of SI thoughts; development of comprehensive mental wellness/sobriety plan.  ? ?Patient Goals:  No changes at this time. ? ?Discharge Plan or Barriers: Per guardian patient is to return to group home with [facility].  CSW has confirmed with group home that patient CAN return.  Group home owner expressed desire to  switch the patient's medication provider.  CSW encouraged group home owner to discuss this with guardian with [facility] as they would need to be involved in that decision.   ? ?Reason for Continuation of Hospitalization
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
## 2021-05-08 NOTE — Plan of Care (Signed)
?  Problem: Education: ?Goal: Knowledge of General Education information will improve ?Description: Including pain rating scale, medication(s)/side effects and non-pharmacologic comfort measures ?Outcome: Not Progressing ?  ?Problem: Health Behavior/Discharge Planning: ?Goal: Ability to manage health-related needs will improve ?Outcome: Not Progressing ?  ?Problem: Clinical Measurements: ?Goal: Ability to maintain clinical measurements within normal limits will improve ?Outcome: Not Progressing ?Goal: Will remain free from infection ?Outcome: Not Progressing ?Goal: Diagnostic test results will improve ?Outcome: Not Progressing ?Goal: Respiratory complications will improve ?Outcome: Not Progressing ?Goal: Cardiovascular complication will be avoided ?Outcome: Not Progressing ?  ?Problem: Self-Concept: ?Goal: Will verbalize positive feelings about self ?Outcome: Not Progressing ?  ?Problem: Self-Care: ?Goal: Ability to participate in self-care as condition permits will improve ?Outcome: Not Progressing ?  ?Problem: Safety: ?Goal: Ability to redirect hostility and anger into socially appropriate behaviors will improve ?Outcome: Not Progressing ?Goal: Ability to remain free from injury will improve ?Outcome: Not Progressing ?  ?Problem: Health Behavior/Discharge Planning: ?Goal: Compliance with prescribed medication regimen will improve ?Outcome: Not Progressing ?  ?Problem: Coping: ?Goal: Coping ability will improve ?Outcome: Not Progressing ?Goal: Will verbalize feelings ?Outcome: Not Progressing ?  ?

## 2021-05-08 NOTE — Care Management Important Message (Signed)
Important Message ? ?Patient Details  ?Name: [name] ?MRN: [id number] ?Date of Birth: 1960/01/10 ? ? ?Medicare Important Message Given:  Yes ? ? ? ? ?Harden Mo, LCSW ?05/08/2021, 11:07 AM ?

## 2021-05-09 NOTE — Discharge Summary (Signed)
Addendum to discharge summary: No change in behavior.  Continues to be calm and appropriate in his behavior.  Taking medication.  Not agitated.  Discharged yesterday was completed but he was never picked up by his group home.  Reportedly they had some sort of crisis and were not able to make the trip.  Plan is still for discharge today with no change to any of the treatment plan. ? ?

## 2021-05-09 NOTE — Progress Notes (Signed)
Active on the unit pacing the halls. Continue to be hyper focused on being able to leave.  Asking if the group home has called about him yet.  He is med compliant with no behavior issues to note. He denies si hi  avh depression anxiety and pain at this encounter.  Will continue to monitor with q15 minute safety rounds. ? ? ? ? ? ?C Butler-Nicholson, LPN ?

## 2021-05-09 NOTE — Progress Notes (Signed)
Patient refused his inhaler, stating that he didn't need it right now. ?

## 2021-05-09 NOTE — Progress Notes (Signed)
This Clinical research associate placed patient's belongings back into the locker, after being informed by CSW that patient would not be discharged today. ?

## 2021-05-09 NOTE — Plan of Care (Signed)
D- Patient alert and oriented to person and situation. Patient presented in a preoccupied, but pleasant mood on assessment stating that he slept "good" last night and had no complaints to voice to this Clinical research associate. Patient denied SI, HI, AVH, and pain at this time. Patient also denied any signs/symptoms of depression/anxiety, stating that overall, he is feeling "pretty good".  Patient had no stated goals for today, he is just antsy about going home. It was reported that patient was supposed to leave yesterday and his Group Home never showed up, so he is under the impression that he will be leaving today. ? ?A- Scheduled medications administered to patient, per MD orders. Support and encouragement provided.  Routine safety checks conducted every 15 minutes.  Patient informed to notify staff with problems or concerns. ? ?R- No adverse drug reactions noted. Patient contracts for safety at this time. Patient compliant with medications and treatment plan. Patient receptive, calm, and cooperative. Patient interacts well with others on the unit.  Patient remains safe at this time. ? ?Problem: Education: ?Goal: Mental status will improve ?Outcome: Progressing ?  ?Problem: Activity: ?Goal: Interest or engagement in activities will improve ?Outcome: Progressing ?Goal: Sleeping patterns will improve ?Outcome: Progressing ?  ?Problem: Safety: ?Goal: Periods of time without injury will increase ?Outcome: Progressing ?  ?

## 2021-05-09 NOTE — Progress Notes (Signed)
?   05/09/21 1120  ?Clinical Encounter Type  ?Visited With Patient  ?Visit Type Initial;Social support  ?Referral From Other (Comment) ?(rounding)  ?Spiritual Encounters  ?Spiritual Needs Other (Comment) ?(not assessed)  ? ?Chaplain Burris engaged Pt for the first time; brief encounter in dayroom. Chaplain Burris greeted Pt and introduced herself as being available for listening. Initiated relationship of trust and support. ?

## 2021-05-09 NOTE — BHH Counselor (Signed)
CSW followed up with the patient's group home owner, [name], to assess for status in visiting the patient so that he can assess the patient and determine if patient can return home. White stated that he would visit pt today to assess for discharge. No other questions/concerns expressed. Contact ended without incident.  ? ?CSW updated psychiatrist, guardian, and nurses regarding this. CSW to follow up with [facility] tomorrow regarding the visit. ? ?Vilma Meckel. Algis Greenhouse, MSW, LCSW, LCAS ?05/09/2021 10:33 AM ?  ?

## 2021-05-09 NOTE — Plan of Care (Signed)
?  Problem: Safety: ?Goal: Periods of time without injury will increase ?Outcome: Progressing ?  ?Problem: Education: ?Goal: Mental status will improve ?Outcome: Progressing ?  ?Problem: Activity: ?Goal: Interest or engagement in activities will improve ?Outcome: Progressing ?Goal: Sleeping patterns will improve ?Outcome: Progressing ?  ?

## 2021-05-09 NOTE — BHH Counselor (Signed)
CSW attempted contact with group home [facility] [place] [phone]). Unable to establish contact or leave voicemail. CSW sent text requesting a call back. ? ?Chalmers Guest. Guerry Bruin, MSW, LCSW, LCAS ?05/09/2021 9:16 AM ? ?

## 2021-05-09 NOTE — Progress Notes (Signed)
Patient just came in, from outside, asking this writer if he was getting discharged. This Clinical research associate told patient that he wasn't leaving today, and he continued to walk towards his room. ?

## 2021-05-09 NOTE — Group Note (Signed)
BHH LCSW Group Therapy Note ? ? ?Group Date: 05/09/2021 ?Start Time: 1400 ?End Time: 1500 ? ? ?Type of Therapy/Topic:  Group Therapy:  Balance in Life ? ?Participation Level:  Minimal  ? ?Description of Group:   ? This group will address the concept of balance and how it feels and looks when one is unbalanced. Patients will be encouraged to process areas in their lives that are out of balance, and identify reasons for remaining unbalanced. Facilitators will guide patients utilizing problem- solving interventions to address and correct the stressor making their life unbalanced. Understanding and applying boundaries will be explored and addressed for obtaining  and maintaining a balanced life. Patients will be encouraged to explore ways to assertively make their unbalanced needs known to significant others in their lives, using other group members and facilitator for support and feedback. ? ?Therapeutic Goals: ?Patient will identify two or more emotions or situations they have that consume much of in their lives. ?Patient will identify signs/triggers that life has become out of balance:  ?Patient will identify two ways to set boundaries in order to achieve balance in their lives:  ?Patient will demonstrate ability to communicate their needs through discussion and/or role plays ? ?Summary of Patient Progress: ?Patient was present for the entirety of group. He participated in the icebreaker activity, but after that spent the rest of the discussion resting.  ? ? ?Therapeutic Modalities:   ?Cognitive Behavioral Therapy ?Solution-Focused Therapy ?Assertiveness Training ? ? ?Glenis Smoker, LCSW ?

## 2021-05-10 NOTE — Progress Notes (Signed)
BHH/BMU LCSW Progress Note ?  ?05/10/2021    9:13 AM ? ?GARWOOD RUMBLEY  ? ?[id number]  ? ?Type of Contact and Topic:  Guardian Contact  ?  ? 05/10/21 0912  ?Legal Guardian  ?Does Patient Have a Stage manager Guardian? Yes  ?Legal Guardian Other:  ?Legal [place], [facility] [place], [phone]  ?Copy of Legal Guardianship Form in Chart Yes  ?Legal Guardian Notified of Arrival  Successfully notified  ?Legal Guardian Notified of Pending Discharge  Successfully notified  ? ?Signed:  ?Durenda Hurt, MSW, LCSWA, LCAS ?05/10/2021 9:13 AM ?

## 2021-05-10 NOTE — Progress Notes (Signed)
BHH/BMU LCSW Progress Note ?  ?05/10/2021    9:22 AM ? ?ORVAN PAPADAKIS  ? ?[phone]  ? ?Type of Contact and Topic:   ?  ?  ? 05/10/21 0922  ?Important Message  ?Medicare important message given? Yes  ? ? ?Notice given on 5/3 at time discharge order was placed, no appeal has been made. Patient has been boarding for past 2 days.  ? ?Signed:  ?Corky Crafts, MSW, LCSWA, LCAS ?05/10/2021 9:23 AM ?

## 2021-05-10 NOTE — Progress Notes (Signed)
Patient's HR was 55 when vitals were checked this morning. This Clinical research associate re-checked it during morning med pass, and it was 54. MD was notified. ?

## 2021-05-10 NOTE — Progress Notes (Signed)
Recreation Therapy Notes ? ?Date: 05/10/2021 ? ?Time: 10:40 am   ? ?Location: Craft room  ? ?Behavioral response: Appropriate ? ?Intervention Topic:  Stress Management   ? ?Discussion/Intervention:  ?Group content on today was focused on stress. The group defined stress and way to cope with stress. Participants expressed how they know when they are stresses out. Individuals described the different ways they have to cope with stress. The group stated reasons why it is important to cope with stress. Patient explained what good stress is and some examples. The group participated in the intervention ?Stress Management?. Individuals were separated into two group and answered questions related to stress.  ?Clinical Observations/Feedback: ?Patient came to group and was focused on what peers and staff had to say about stress management.  Individual was social with peers and staff while participating in the intervention.    ?[name] LRT/CTRS  ? ? ? ? ? ? ? ?[name] ?05/10/2021 12:52 PM ?

## 2021-05-10 NOTE — Progress Notes (Signed)
?  [facility] Adult Case Management Discharge Plan : ? ?Will you be returning to the same living situation after discharge:  Yes,  Patient to return to group home.  ? ?At discharge, do you have transportation home?: Yes,  Group home manager to assist with transportation from hospital.  ? ?Do you have the ability to pay for your medications: Yes,  Medicare A and B   Patient informed of right to appeal discharge, provided phone number to [facility]. Patient expressed no interest in appealing discharge at this time. CSW will continue to monitor situation. ? ?Release of information consent forms completed and in the chart;  Patient's signature needed at discharge. ? ?Patient to Follow up at: ? Follow-up Information   ? ? [facility] Follow up.   ?Why: Please call to follow up. ?Contact information: ?[address] ?[place] [address] ?[phone] ? ? ?  ?  ? ?  ?  ? ?  ? ? ?Next level of care provider has access to [facility] Link:no ? ?Safety Planning and Suicide Prevention discussed: Yes,  SPE completed with patient and Laurita Quint, Legal Guardian.  ?  ?Has patient been referred to the Quitline?: Patient refused referral ?Tobacco Use: High Risk  ? Smoking Tobacco Use: Every Day  ? Smokeless Tobacco Use: Never  ? Passive Exposure: Not on file  ? ? ?Patient has been referred for addiction treatment: Pt. refused referral Patient denies active substance use, UDS not on file, BAC <10.  ? ?Corky Crafts, LCSWA ?05/10/2021, 9:15 AM ?

## 2021-05-10 NOTE — Progress Notes (Signed)
Patient ID: [name], male   DOB: March 28, 1960, 61 y.o.   MRN: [id number] ? ?Discharge Note: ? ?Patient denies SI/HI/AVH at this time. Discharge instructions, AVS, prescriptions, and transition record gone over with patient. Patient agrees to comply with medication management, follow-up visit, and outpatient therapy. Patient belongings returned to patient. Patient questions and concerns addressed and answered. Patient ambulatory off unit. Patient discharged back to [facility] with his caseworker, [facility].  ? ? ?

## 2021-05-10 NOTE — Progress Notes (Signed)
D- Patient alert and oriented to person and situation. Patient presents in a pleasant mood on assessment stating that he slept "alright" last night and had no complaints to voice to this Clinical research associate. Patient denies SI, HI, AVH, and pain at this time. Patient also denies any signs/symptoms of depression and anxiety, reporting that he is "feeling pretty good" overall. Patient had no stated goals for today, he's just over anxious to be discharged. ? ?A- Scheduled medications administered to patient, per MD orders. Support and encouragement provided. Routine safety checks conducted every 15 minutes. Patient informed to notify staff with problems or concerns. ? ?R- No adverse drug reactions noted. Patient contracts for safety at this time. Patient compliant with medications and treatment plan. Patient receptive, calm, and cooperative. Patient interacts well with others on the unit.  Patient remains safe at this time. ? ?

## 2021-10-01 ENCOUNTER — Telehealth: Payer: Self-pay

## 2021-10-01 ENCOUNTER — Other Ambulatory Visit: Payer: Self-pay

## 2021-10-01 ENCOUNTER — Encounter: Payer: Self-pay | Admitting: Internal Medicine

## 2021-10-01 DIAGNOSIS — Z1211 Encounter for screening for malignant neoplasm of colon: Secondary | ICD-10-CM

## 2021-10-01 MED ORDER — GOLYTELY 236 G PO SOLR: 4000.0000 mL | Freq: Once | ORAL | 0 refills | Status: AC

## 2021-10-01 NOTE — Telephone Encounter (Signed)
Gastroenterology Pre-Procedure Review  Request Date: 10/31/21 Requesting Physician: Dr. Allen Norris  PATIENT REVIEW QUESTIONS: Answered by [name] ([place] Provider where patient lives)   1. Are you having any GI issues? no 2. Do you have a personal history of Polyps? no 3. Do you have a family history of Colon Cancer or Polyps? no 4. Diabetes Mellitus? no 5. Joint replacements in the past 12 months?no 6. Major health problems in the past 3 months? 05/02/21 ER Visit Hallucinations 7. Any artificial heart valves, MVP, or defibrillator?no    MEDICATIONS & ALLERGIES:    Patient reports the following regarding taking any anticoagulation/antiplatelet therapy:   Plavix, Coumadin, Eliquis, Xarelto, Lovenox, Pradaxa, Brilinta, or Effient? no Aspirin? no  Patient confirms/reports the following medications:  Current Outpatient Medications  Medication Sig Dispense Refill   amLODipine (NORVASC) 5 MG tablet Take 1 tablet (5 mg total) by mouth daily. 30 tablet 1   atorvastatin (LIPITOR) 40 MG tablet Take 1 tablet (40 mg total) by mouth at bedtime. 30 tablet 1   fluticasone furoate-vilanterol (BREO ELLIPTA) 200-25 MCG/ACT AEPB Inhale 1 puff into the lungs daily. 1 each 1   lithium carbonate (LITHOBID) 300 MG CR tablet Take 1 tablet (300 mg total) by mouth 3 (three) times daily. 90 tablet 1   losartan (COZAAR) 50 MG tablet Take 1 tablet (50 mg total) by mouth daily. 30 tablet 1   OLANZapine (ZYPREXA) 10 MG tablet Take 1 tablet (10 mg total) by mouth daily. 30 tablet 1   QUEtiapine (SEROQUEL) 200 MG tablet Take 1 tablet (200 mg total) by mouth at bedtime. 30 tablet 1   risperiDONE (RISPERDAL M-TABS) 1 MG disintegrating tablet Take 1 tablet (1 mg total) by mouth 2 (two) times daily as needed (agitation). 60 tablet 1   No current facility-administered medications for this visit.    Patient confirms/reports the following allergies:  Allergies  Allergen Reactions   Haldol [Haloperidol] Other (See  Comments)    unspecified    No orders of the defined types were placed in this encounter.   AUTHORIZATION INFORMATION Primary Insurance: 1D#: Group #:  Secondary Insurance: 1D#: Group #:  SCHEDULE INFORMATION: Date: 10/31/21 Time: Location: [place]

## 2021-10-31 ENCOUNTER — Encounter: Admission: RE | Payer: Self-pay | Source: Home / Self Care

## 2021-10-31 ENCOUNTER — Ambulatory Visit: Admission: RE | Admit: 2021-10-31 | Payer: Medicare Other | Source: Home / Self Care | Admitting: Gastroenterology

## 2021-10-31 SURGERY — COLONOSCOPY WITH PROPOFOL
Anesthesia: General

## 2021-12-20 ENCOUNTER — Telehealth: Payer: Self-pay

## 2021-12-20 ENCOUNTER — Other Ambulatory Visit: Payer: Self-pay | Admitting: *Deleted

## 2021-12-20 DIAGNOSIS — Z1211 Encounter for screening for malignant neoplasm of colon: Secondary | ICD-10-CM

## 2021-12-20 MED ORDER — PEG 3350-KCL-NABCB-NACL-NASULF 236 G PO SOLR: 4000.0000 mL | Freq: Once | ORAL | 0 refills | Status: AC

## 2021-12-20 NOTE — Telephone Encounter (Addendum)
Crystal called back. Colonoscopy have been reschedule for 01/23/2022. She stated that she will let caregiver and patient know of the procedure date. New instructions will be sent. Prep solution have been resent just in case, Crystal was not sure if this was pick up. She will contact [name] (caregiver).

## 2021-12-20 NOTE — Telephone Encounter (Signed)
Received phone call back from Crystal to schedule patients colonoscopy.  Returned phone call lvm for her to call back either myself or my coworker Johny Drilling, CMA to assist in scheduling colonoscopy.  This is a reschedule colonoscopy originally scheduled with Dr. Servando Snare at [facility] on 11/01/22.  Thanks,  [place], [place]

## 2021-12-20 NOTE — Addendum Note (Signed)
Addended by: Tawnya Crook on: 12/20/2021 10:20 AM   Modules accepted: Orders

## 2021-12-20 NOTE — Telephone Encounter (Signed)
Crystal at Dr. [name] office lvm yesterday requesting to get a date for patients colonoscopy.  The previously scheduled colonoscopy was canceled.  No reason notated.  Left voice message for her to call back to reschedule it for patient.  Thanks,  [place], [place]

## 2022-01-22 ENCOUNTER — Telehealth: Payer: Self-pay

## 2022-01-22 ENCOUNTER — Encounter: Payer: Self-pay | Admitting: Gastroenterology

## 2022-01-22 DIAGNOSIS — Z1211 Encounter for screening for malignant neoplasm of colon: Secondary | ICD-10-CM

## 2022-01-22 NOTE — Telephone Encounter (Signed)
[employer] from group home called stating that she was not aware of pt having colonoscopy tomorrow and pt has eaten today. This will need to be rescheduled please call [place] and/or [name] [phone]  I will call to have procedure moved to depot

## 2022-01-22 NOTE — Telephone Encounter (Signed)
Phone call returned to [facility] in regards to patients colonoscopy scheduled for tomorrow with Dr. Allen Norris. West Carbo stated that he and staff were aware of the procedure and the patient ate without staff knowing it.  Colonoscopy has been rescheduled to 02/18/22 with Vikki in Endo.  West Carbo said he will discuss with patient how important it is to follow the diet.    No PA required.  Thanks,  [place], [place]

## 2022-02-17 ENCOUNTER — Encounter: Payer: Self-pay | Admitting: Gastroenterology

## 2022-02-18 ENCOUNTER — Encounter: Payer: Self-pay | Admitting: Anesthesiology

## 2022-02-18 ENCOUNTER — Encounter: Admission: RE | Payer: Self-pay | Source: Home / Self Care

## 2022-02-18 ENCOUNTER — Ambulatory Visit: Admission: RE | Admit: 2022-02-18 | Payer: Medicare Other | Source: Home / Self Care | Admitting: Gastroenterology

## 2022-02-18 HISTORY — DX: Essential (primary) hypertension: I10

## 2022-02-18 SURGERY — COLONOSCOPY WITH PROPOFOL
Anesthesia: General

## 2022-02-18 NOTE — Anesthesia Preprocedure Evaluation (Deleted)
Anesthesia Evaluation    Airway        Dental   Pulmonary Current Smoker          Cardiovascular hypertension,      Neuro/Psych  PSYCHIATRIC DISORDERS    Schizophrenia     GI/Hepatic   Endo/Other    Renal/GU      Musculoskeletal   Abdominal   Peds  Hematology   Anesthesia Other Findings   Reproductive/Obstetrics                             Anesthesia Physical Anesthesia Plan  ASA: 2  Anesthesia Plan: General   Post-op Pain Management:    Induction:   PONV Risk Score and Plan:   Airway Management Planned:   Additional Equipment:   Intra-op Plan:   Post-operative Plan:   Informed Consent:   Plan Discussed with:   Anesthesia Plan Comments:        Anesthesia Quick Evaluation

## 2022-03-19 ENCOUNTER — Telehealth: Payer: Self-pay

## 2022-03-19 NOTE — Telephone Encounter (Signed)
Received phone call from Randall at Dr. Guerry Bruin office requesting to reschedule Mr. Older's colonoscopy.  I discussed with the office manager, Ginger and we have agreed to reschedule as this will be Mr. Stampley 3 reschedule and per office policy we do not allow more than 3 reschedules.  Explained to Chrystal that West Carbo (pt care mgr) and I spoke prior to the previously scheduled colonoscopy and West Carbo informed him that he did not want to have it done.  Chrystal said based on the conversation Mr.Scheller had with Dr. Rosario Jacks yesterday he is willing to have it done.  Contacted West Carbo after discussing with Ginger and Crystal to reschedule Mr. Camire's colonoscopy. West Carbo would like to have a conversation with Mr. Such and his legal guardian Claiborne Billings ([place]) prior to rescheduling colonoscopy.  I've asked West Carbo to call me back when he is ready to have Mr. Mcbrien's colonoscopy rescheduled.  Thanks, [place], [place]

## 2022-05-02 ENCOUNTER — Other Ambulatory Visit: Payer: Self-pay

## 2022-05-02 ENCOUNTER — Emergency Department
Admission: EM | Admit: 2022-05-02 | Discharge: 2022-05-06 | Disposition: A | Discharge status: Other Acute Inpatient Hospital | Payer: Medicare Other | Attending: Emergency Medicine | Admitting: Emergency Medicine

## 2022-05-02 DIAGNOSIS — F250 Schizoaffective disorder, bipolar type: Secondary | ICD-10-CM | POA: Diagnosis present

## 2022-05-02 DIAGNOSIS — I10 Essential (primary) hypertension: Secondary | ICD-10-CM | POA: Insufficient documentation

## 2022-05-02 DIAGNOSIS — F29 Unspecified psychosis not due to a substance or known physiological condition: Secondary | ICD-10-CM | POA: Diagnosis not present

## 2022-05-02 DIAGNOSIS — R4182 Altered mental status, unspecified: Secondary | ICD-10-CM | POA: Diagnosis present

## 2022-05-02 LAB — COMPREHENSIVE METABOLIC PANEL
ALT: 23 U/L (ref 0–44)
AST: 32 U/L (ref 15–41)
Albumin: 4.2 g/dL (ref 3.5–5.0)
Alkaline Phosphatase: 84 U/L (ref 38–126)
Anion gap: 13 (ref 5–15)
BUN: 9 mg/dL (ref 6–20)
CO2: 19 mmol/L — ABNORMAL LOW (ref 22–32)
Calcium: 9.2 mg/dL (ref 8.9–10.3)
Chloride: 102 mmol/L (ref 98–111)
Creatinine, Ser: 0.88 mg/dL (ref 0.61–1.24)
GFR, Estimated: >60 mL/min (ref >60)
Glucose, Bld: 81 mg/dL (ref 70–99)
Potassium: 3.3 mmol/L — ABNORMAL LOW (ref 3.5–5.1)
Sodium: 134 mmol/L — ABNORMAL LOW (ref 135–145)
Total Bilirubin: 1 mg/dL (ref 0.3–1.2)
Total Protein: 7.9 g/dL (ref 6.5–8.1)

## 2022-05-02 LAB — URINALYSIS, ROUTINE W REFLEX MICROSCOPIC
Bacteria, UA: NONE SEEN
Bilirubin Urine: NEGATIVE
Glucose, UA: NEGATIVE mg/dL
Hgb urine dipstick: NEGATIVE
Ketones, ur: 20 mg/dL — AB
Leukocytes,Ua: NEGATIVE
Nitrite: NEGATIVE
Protein, ur: 30 mg/dL — AB
Specific Gravity, Urine: 1.012 (ref 1.005–1.030)
pH: 6 (ref 5.0–8.0)

## 2022-05-02 LAB — ETHANOL: Alcohol, Ethyl (B): <10 mg/dL (ref <10)

## 2022-05-02 LAB — CBC
HCT: 42.2 % (ref 39.0–52.0)
Hemoglobin: 13.2 g/dL (ref 13.0–17.0)
MCH: 31.7 pg (ref 26.0–34.0)
MCHC: 31.3 g/dL (ref 30.0–36.0)
MCV: 101.2 fL — ABNORMAL HIGH (ref 80.0–100.0)
Platelets: 240 10*3/uL (ref 150–400)
RBC: 4.17 MIL/uL — ABNORMAL LOW (ref 4.22–5.81)
RDW: 12.6 % (ref 11.5–15.5)
WBC: 8.6 10*3/uL (ref 4.0–10.5)
nRBC: 0 % (ref 0.0–0.2)

## 2022-05-02 LAB — URINE DRUG SCREEN, QUALITATIVE (ARMC ONLY)
Amphetamines, Ur Screen: NOT DETECTED
Barbiturates, Ur Screen: NOT DETECTED
Benzodiazepine, Ur Scrn: NOT DETECTED
Cannabinoid 50 Ng, Ur ~~LOC~~: NOT DETECTED
Cocaine Metabolite,Ur ~~LOC~~: NOT DETECTED
MDMA (Ecstasy)Ur Screen: NOT DETECTED
Methadone Scn, Ur: NOT DETECTED
Opiate, Ur Screen: NOT DETECTED
Phencyclidine (PCP) Ur S: NOT DETECTED
Tricyclic, Ur Screen: NOT DETECTED

## 2022-05-02 LAB — ACETAMINOPHEN LEVEL: Acetaminophen (Tylenol), Serum: <10 ug/mL — ABNORMAL LOW (ref 10–30)

## 2022-05-02 LAB — LITHIUM LEVEL: Lithium Lvl: 0.81 mmol/L (ref 0.60–1.20)

## 2022-05-02 LAB — SALICYLATE LEVEL: Salicylate Lvl: <7 mg/dL — ABNORMAL LOW (ref 7.0–30.0)

## 2022-05-02 MED ORDER — LOSARTAN POTASSIUM 50 MG PO TABS
50.0000 mg | ORAL_TABLET | Freq: Every day | ORAL | Status: DC
  Administered 2022-05-02 – 2022-05-06 (×5): 50 mg via ORAL
  Filled 2022-05-02 (×5): qty 1

## 2022-05-02 MED ORDER — LITHIUM CARBONATE ER 300 MG PO TBCR
600.0000 mg | EXTENDED_RELEASE_TABLET | Freq: Every day | ORAL | Status: DC
  Administered 2022-05-02 – 2022-05-05 (×4): 600 mg via ORAL
  Filled 2022-05-02 (×5): qty 2

## 2022-05-02 MED ORDER — ATORVASTATIN CALCIUM 20 MG PO TABS
40.0000 mg | ORAL_TABLET | Freq: Every day | ORAL | Status: DC
  Administered 2022-05-02 – 2022-05-05 (×4): 40 mg via ORAL
  Filled 2022-05-02 (×4): qty 2

## 2022-05-02 MED ORDER — OLANZAPINE 10 MG PO TABS
10.0000 mg | ORAL_TABLET | Freq: Every day | ORAL | Status: DC
  Administered 2022-05-02: 10 mg via ORAL
  Filled 2022-05-02: qty 1

## 2022-05-02 MED ORDER — OLANZAPINE-SAMIDORPHAN 10-10 MG PO TABS: 1.0000 | ORAL_TABLET | Freq: Every day | ORAL | Status: DC

## 2022-05-02 MED ORDER — FLUTICASONE FUROATE-VILANTEROL 200-25 MCG/ACT IN AEPB
1.0000 | INHALATION_SPRAY | Freq: Every day | RESPIRATORY_TRACT | Status: DC
  Administered 2022-05-03 – 2022-05-06 (×4): 1 via RESPIRATORY_TRACT
  Filled 2022-05-02: qty 28

## 2022-05-02 MED ORDER — RISPERIDONE 0.5 MG PO TBDP: 1.0000 mg | ORAL_TABLET | Freq: Two times a day (BID) | ORAL | Status: DC | PRN

## 2022-05-02 MED ORDER — AMLODIPINE BESYLATE 5 MG PO TABS
5.0000 mg | ORAL_TABLET | Freq: Every day | ORAL | Status: DC
  Administered 2022-05-02 – 2022-05-06 (×5): 5 mg via ORAL
  Filled 2022-05-02 (×5): qty 1

## 2022-05-02 MED ORDER — AMANTADINE HCL 100 MG PO CAPS
100.0000 mg | ORAL_CAPSULE | Freq: Two times a day (BID) | ORAL | Status: DC
  Administered 2022-05-02 – 2022-05-06 (×8): 100 mg via ORAL
  Filled 2022-05-02 (×9): qty 1

## 2022-05-02 NOTE — ED Triage Notes (Addendum)
Pt to ED via POV from Group home on [address]. Pt here with Child psychotherapist. SW reports pt has been more confused and concerned for possible UTI or abnormal lithium levels. Pt with hx of schizophrenia. Pt incoherent in triage with flight of ideas. Pt denies SI/HI. Pt denies etoh or substance abuse.   While in triage pt mumbling, "I'd be the one to kill her."

## 2022-05-02 NOTE — ED Notes (Signed)
Pt belongings: [employer] Underwear, pants, shirt.  Collected and labeled with patient info and placed behind quad nurses station.

## 2022-05-02 NOTE — ED Notes (Signed)
Pt belongings collected and changed into burgundy scrubs.

## 2022-05-02 NOTE — ED Provider Notes (Signed)
[facility] Provider Note    Event Date/Time   First MD Initiated Contact with Patient 05/02/22 1658     (approximate)   History   Altered Mental Status   HPI  [name] is a 62 y.o. male with past medical history of hypertension and schizophrenia who presents because of altered mental status.  Patient is accompanied by Child psychotherapist who is concerned about confusion.  Patient has no current complaints at this time.  When asked why he is here he goes off about multiple different things and has flight of ideas.  He is asking whether he can have caps put on his teeth whether he can be circumcised and then talks about going to medical school.  He denies hallucinations auditory or visual.  Denies drug or alcohol use.  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  Tells me lithium makes him feel bad but he is taking it.     Past Medical History:  Diagnosis Date   Hypertension    Schizophrenia [facility])     Patient Active Problem List   Diagnosis Date Noted   Schizoaffective disorder (HCC) 05/02/2021   Schizoaffective disorder, bipolar type (HCC) 06/15/2016   Hypertension 06/13/2016   Tobacco use disorder 10/04/2015   Noncompliance 10/03/2015     Physical Exam  Triage Vital Signs: ED Triage Vitals  Enc Vitals Group     BP 05/02/22 1511 (!) 158/94     Pulse Rate 05/02/22 1511 95     Resp 05/02/22 1511 18     Temp 05/02/22 1511 98 F (36.7 C)     Temp Source 05/02/22 1511 Oral     SpO2 05/02/22 1511 98 %     Weight --      Height --      Head Circumference --      Peak Flow --      Pain Score 05/02/22 1508 0     Pain Loc --      Pain Edu? --      Excl. in GC? --     Most recent vital signs: Vitals:   05/02/22 1511  BP: (!) 158/94  Pulse: 95  Resp: 18  Temp: 98 F (36.7 C)  SpO2: 98%     General: Awake, no distress.  CV:  Good peripheral  perfusion.  Resp:  Normal effort.  Abd:  No distention.  Neuro:             Awake, Alert, Oriented x 3  Other:  PERRLA, face is symmetric equal strength in bilateral upper and lower extremities Patient has elevated mood with flight of ideas and pressured speech   ED Results / Procedures / Treatments  Labs (all labs ordered are listed, but only abnormal results are displayed) Labs Reviewed  COMPREHENSIVE METABOLIC PANEL - Abnormal; Notable for the following components:      Result Value   Sodium 134 (*)    Potassium 3.3 (*)    CO2 19 (*)    All other components within normal limits  SALICYLATE LEVEL - Abnormal; Notable for the following components:   Salicylate Lvl <7.0 (*)    All other components within normal limits  ACETAMINOPHEN LEVEL - Abnormal; Notable for the following components:   Acetaminophen (Tylenol), Serum <10 (*)    All other components within normal limits  CBC - Abnormal; Notable for the  following components:   RBC 4.17 (*)    MCV 101.2 (*)    All other components within normal limits  ETHANOL  URINE DRUG SCREEN, QUALITATIVE ([facility] ONLY)  LITHIUM LEVEL  URINALYSIS, ROUTINE W REFLEX MICROSCOPIC     EKG     RADIOLOGY    PROCEDURES:  Critical Care performed: No  Procedures   MEDICATIONS ORDERED IN ED: Medications - No data to display   IMPRESSION / MDM / ASSESSMENT AND PLAN / ED COURSE  I reviewed the triage vital signs and the nursing notes.                              Patient's presentation is most consistent with acute complicated illness / injury requiring diagnostic workup.  Differential diagnosis includes, but is not limited to, decompensated schizophrenia, medication noncompliance, intoxication, withdrawal, electrolyte abnormality, metabolic encephalopathy  The patient is a 54-year-old male with history of schizophrenia who presents with altered mental status.  He is accompanied by Child psychotherapist who told triage that she is concerned  about increasing confusion.  Patient appears to have elevated mood with flight of ideas and pressured speech but he is pleasant and is willing to speak with psychiatry today.  He denies SI or HI denies hallucinations.  His neurologic exam is nonfocal.  He is mildly hypertensive but his vitals are otherwise reassuring.  Labs obtained showed a therapeutic lithium level negative Tylenol and salicylate negative alcohol negative UDS.  Do not feel that he needs head imaging at this time as I suspect that this is more primary psychiatric.  Will consult psychiatry.  Given he is willing to stay will not place under IVC at this time.       FINAL CLINICAL IMPRESSION(S) / ED DIAGNOSES   Final diagnoses:  Psychosis, unspecified psychosis type (HCC)     Rx / DC Orders   ED Discharge Orders     None        Note:  This document was prepared using Dragon voice recognition software and may include unintentional dictation errors.   [name], MD 05/02/22 [phone]

## 2022-05-02 NOTE — BH Assessment (Signed)
Comprehensive Clinical Assessment (CCA) Screening, Triage and Referral Note  05/02/2022 [name] [id number] Recommendations for Services/Supports/Treatments: Consulted with [name]., NP, who recommended medication stabilization of mood and a re-evaluation after changes in the AM.   [name] is a 62 year old, English speaking, Black male with a hx of Schizoaffective disorder, bipolar type. Pt presented to [facility] ED voluntarily due to presenting with an altered mental status. Upon assessment, unable to answer assessment questions appropriately. Pt had no insight into why he'd presented to the hospital. Pt had flight of ideas and expressed nonsensical responses. The pt. had grandiose delusions and poor judgment, explaining that he is [name] of [place], lives in [place], and that he is the son of the music artist [name]. Pt did not appear to be responding to internal or external stimuli; however. Pt had nonlinear, irrelevant speech. Pt presented with hyper manic mood; affect was congruent. Pt unable to confirm or deny current SI/HI or AV/H. BAL/UDS unremarkable.  Chief Complaint:  Chief Complaint  Patient presents with   Altered Mental Status   Visit Diagnosis: Schizoaffective disorder, bipolar type  Patient Reported Information How did you hear about [place]? Other (Comment) (Child psychotherapist)  What Is the Reason for Your Visit/Call Today? Pt to ED via POV from Group home on [address]. Pt here with Child psychotherapist. SW reports pt has been more confused and concerned for possible UTI or abnormal lithium levels. Pt with hx of schizophrenia. Pt incoherent in triage with flight of ideas.  How Long Has This Been Causing You Problems? <Week  What Do You Feel Would Help You the Most Today? -- (UTA)   Have You Recently Had Any Thoughts About Hurting Yourself? -- (UTA)  Are You Planning to Commit Suicide/Harm Yourself At This time? -- (UTA)   Have you Recently Had Thoughts About  Hurting Someone Else? -- (UTA)  Are You Planning to Harm Someone at This Time? -- (UTA)  Explanation: -- (UTA)   Have You Used Any Alcohol or Drugs in the Past 24 Hours? -- (UTA)  How Long Ago Did You Use Drugs or Alcohol? No data recorded What Did You Use and How Much? UTA   Do You Currently Have a Therapist/Psychiatrist? -- (UTA)  Name of Therapist/Psychiatrist: -- (UTA)   Have You Been Recently Discharged From Any Office Practice or Programs? -- (UTA)  Explanation of Discharge From Practice/Program: UTA    CCA Screening Triage Referral Assessment Type of Contact: Face-to-Face  Telemedicine Service Delivery:   Is this Initial or Reassessment?   Date Telepsych consult ordered in [facility]:    Time Telepsych consult ordered in [facility]:    Location of Assessment: [facility] ED  Provider Location: [facility] ED    Collateral Involvement: [facility] [place] [phone]   Does Patient Have a Court Appointed Legal Guardian? No data recorded Name and Contact of Legal Guardian: No data recorded If Minor and Not Living with Parent(s), Who has Custody? n/a  Is CPS involved or ever been involved? Never  Is APS involved or ever been involved? Never   Patient Determined To Be At Risk for Harm To Self or Others Based on Review of Patient Reported Information or Presenting Complaint? No  Method: No Plan  Availability of Means: No access or NA  Intent: Vague intent or NA  Notification Required: No need or identified person  Additional Information for Danger to Others Potential: -- ([facility] [place] [phone]))  Additional Comments for Danger to Others Potential: No  data recorded Are There Guns or Other Weapons in Your Home? No  Types of Guns/Weapons: UTA  Are These Weapons Safely Secured?                            -- (UTA)  Who Could Verify You Are Able To Have These Secured: N/A  Do You Have any Outstanding Charges, Pending Court Dates, Parole/Probation?  UTA  Contacted To Inform of Risk of Harm To Self or Others: Other: Comment   Does Patient Present under Involuntary Commitment? No    County of Residence: [place]   Patient Currently Receiving the Following Services: Medication Management   Determination of Need: Emergent (2 hours)   Options For Referral: ED Visit   Discharge Disposition:     [name] R [name], LCAS

## 2022-05-02 NOTE — ED Notes (Signed)
RN attempted to call legal guardian, Maxine Glenn with # in chart [phone]. Person who answered the phone stated she was not pt's legal guardian

## 2022-05-02 NOTE — ED Notes (Signed)
Pt reoriented to surroundings and given dinner.

## 2022-05-02 NOTE — BH Assessment (Addendum)
This Clinical research associate attempted to contact pt's legal guardian Maxine Glenn [phone]) of [facility] x[employer]; however there was no answer/ability to leave a voicemail. Psych team to follow up.

## 2022-05-03 DIAGNOSIS — F250 Schizoaffective disorder, bipolar type: Secondary | ICD-10-CM | POA: Diagnosis not present

## 2022-05-03 MED ORDER — LORAZEPAM 2 MG/ML IJ SOLN
2.0000 mg | Freq: Once | INTRAMUSCULAR | Status: AC
  Administered 2022-05-03: 2 mg via INTRAMUSCULAR
  Filled 2022-05-03: qty 1

## 2022-05-03 MED ORDER — OLANZAPINE 10 MG PO TABS
10.0000 mg | ORAL_TABLET | Freq: Two times a day (BID) | ORAL | Status: DC
  Administered 2022-05-03 – 2022-05-06 (×6): 10 mg via ORAL
  Filled 2022-05-03 (×6): qty 1

## 2022-05-03 MED ORDER — ZIPRASIDONE MESYLATE 20 MG IM SOLR
20.0000 mg | Freq: Four times a day (QID) | INTRAMUSCULAR | Status: DC | PRN
  Administered 2022-05-03 – 2022-05-06 (×3): 20 mg via INTRAMUSCULAR
  Filled 2022-05-03 (×3): qty 20

## 2022-05-03 MED ORDER — ZIPRASIDONE MESYLATE 20 MG IM SOLR
10.0000 mg | Freq: Once | INTRAMUSCULAR | Status: AC
  Administered 2022-05-03: 10 mg via INTRAMUSCULAR
  Filled 2022-05-03: qty 20

## 2022-05-03 MED ORDER — DIPHENHYDRAMINE HCL 50 MG/ML IJ SOLN
50.0000 mg | Freq: Once | INTRAMUSCULAR | Status: AC
  Administered 2022-05-03: 50 mg via INTRAMUSCULAR
  Filled 2022-05-03: qty 1

## 2022-05-03 MED ORDER — LITHIUM CARBONATE ER 300 MG PO TBCR
300.0000 mg | EXTENDED_RELEASE_TABLET | Freq: Every day | ORAL | Status: DC
  Administered 2022-05-03 – 2022-05-06 (×4): 300 mg via ORAL
  Filled 2022-05-03 (×4): qty 1

## 2022-05-03 MED ORDER — LITHIUM CARBONATE ER 300 MG PO TBCR: 300.0000 mg | EXTENDED_RELEASE_TABLET | Freq: Two times a day (BID) | ORAL | Status: DC

## 2022-05-03 MED ORDER — DIPHENHYDRAMINE HCL 50 MG/ML IJ SOLN
50.0000 mg | Freq: Four times a day (QID) | INTRAMUSCULAR | Status: DC | PRN
  Filled 2022-05-03: qty 1

## 2022-05-03 MED ORDER — NICOTINE 21 MG/24HR TD PT24
21.0000 mg | MEDICATED_PATCH | Freq: Once | TRANSDERMAL | Status: AC
  Administered 2022-05-03: 21 mg via TRANSDERMAL
  Filled 2022-05-03: qty 1

## 2022-05-03 MED ORDER — LORAZEPAM 2 MG/ML IJ SOLN
2.0000 mg | Freq: Four times a day (QID) | INTRAMUSCULAR | Status: DC | PRN
  Administered 2022-05-03 – 2022-05-04 (×2): 2 mg via INTRAMUSCULAR
  Filled 2022-05-03 (×3): qty 1

## 2022-05-03 MED ORDER — ACETAMINOPHEN 325 MG PO TABS
650.0000 mg | ORAL_TABLET | Freq: Four times a day (QID) | ORAL | Status: DC | PRN
  Administered 2022-05-03 – 2022-05-06 (×2): 650 mg via ORAL
  Filled 2022-05-03 (×2): qty 2

## 2022-05-03 NOTE — ED Notes (Signed)
Patient yelling out in agitated manner.

## 2022-05-03 NOTE — ED Notes (Signed)
Continues to yell out and speak in agitated.

## 2022-05-03 NOTE — ED Notes (Signed)
Pt has called this EDT to him room over 10 times in the past hour. Pt making requests for things this EDT is unable to provide, such as a "radio", a "wheelchair", and a "recreation room".   Pt getting increasingly angry and agitated with this EDT because EDT cannot give pt what he wants. EDT explained what he is allowed in this area of the ED and offered entertainment options for pt to choose from including books, paper, crayons, and coloring pages.. EDT brought a coloring sheet and two crayons for pts entertainment, per pts request.   Pt is heard yelling and curing within his room at this time. Pt also pacing around in his room with a steady gait.

## 2022-05-03 NOTE — ED Notes (Signed)
Pt continues to beat on the walls and yell. Pt responding to external stimuli.

## 2022-05-03 NOTE — ED Notes (Signed)
Meal tray given pt stated they couldn't eat the grilled cheese sandwich and demanded someone call them in a burger.

## 2022-05-03 NOTE — ED Notes (Signed)
VOL/pending psych consult 

## 2022-05-03 NOTE — ED Provider Notes (Signed)
Emergency Medicine Observation Re-evaluation Note  [name] is a 62 y.o. male, seen on rounds today.  Pt initially presented to the ED for complaints of Altered Mental Status Currently, the patient is intermittently agitated.  Physical Exam  BP (!) 149/97   Pulse 95   Temp 98 F (36.7 C) (Oral)   Resp 18   SpO2 98%  Physical Exam General: Intermittently agitated Cardiac: No cyanosis Lungs: Equal rise and fall Psych: Intermittently agitated  ED Course / MDM  EKG:   I have reviewed the labs performed to date as well as medications administered while in observation.  Recent changes in the last 24 hours include patient required IM calming agents for agitation which was unable to be verbally redirected.  He is currently calm.  Plan  Current plan is for psychiatric disposition.    Irean Hong, MD 05/03/22 [phone]

## 2022-05-03 NOTE — ED Notes (Signed)
Pt agitated, yelling, banging on door. Pt was told to stop, pt continues. Dolores Frame MD notified

## 2022-05-03 NOTE — ED Notes (Signed)
Continues to yell out in agitated manner.

## 2022-05-03 NOTE — ED Notes (Signed)
Pt spilled cereal across his bed and entire floor. EVS notified. Pt provided with wash cloths to start sweeping cereal into a pile. EVS in room cleaning floor now. Old sheets and blankets placed in soiled linen bag. New sheet, 2 blankets, a pillow, and a pillow case provided to pt.   Pt asked to make a phone call. Pt reminded of phone times, which will be in the morning.

## 2022-05-03 NOTE — ED Notes (Signed)
Pt yelling, banging on bed/wall. Dustin Frame MD notified

## 2022-05-03 NOTE — ED Notes (Signed)
Pt banging on the glass and screaming at staff. Pt instructed to stop. Pt argumentative and stating "I don't want to see you anymore." "Don't come back in here."
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
## 2022-05-04 DIAGNOSIS — F250 Schizoaffective disorder, bipolar type: Secondary | ICD-10-CM | POA: Diagnosis not present
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
## 2022-05-04 NOTE — ED Notes (Signed)
Pt provided lunch tray and drink at this time.  

## 2022-05-04 NOTE — BH Assessment (Addendum)
Per [facility] AC Alcario Drought), patient to be referred out of system.  Referral information for Psychiatric Hospitalization faxed to;   [facility] [phone]- [phone]) No available beds  [facility] [phone]- [phone]),   [facility] [phone]),  [address] [phone]),   [facility] [phone] -or- [phone]),   [facility] [phone])  [place] [phone] or [phone]),   [facility] [phone] -[phone]) [phone]

## 2022-05-04 NOTE — ED Notes (Signed)
Meal consumed and trash disposed of properly

## 2022-05-04 NOTE — ED Notes (Addendum)
Pt standing at his doorway with the door open snapping and singing to the [facility] in the quad

## 2022-05-04 NOTE — ED Notes (Signed)
VOL/Consult Completed/Rec Inpt Admit 

## 2022-05-04 NOTE — ED Notes (Signed)
Patient yelling out and cursing at staff.

## 2022-05-04 NOTE — BH Assessment (Signed)
Writer spoke with the patient to complete an updated/reassessment. Patient was able to participate in the interview but speech wasn't clear and most of his answers were not related to the questions.  Spoke with the Group Home [name] [phone]), he shared how the patient presents at baseline. He also shared he had spoken with him via phone. He reports he is improving but not at baseline.

## 2022-05-04 NOTE — ED Notes (Signed)
Pt requested shower; provided clean hospital clothing and linens.  Shower setup provided with soap, shampoo, toothbrush/toothpaste, and deodorant.  Pt able to preform own ADL's with no assistance.    

## 2022-05-04 NOTE — ED Notes (Signed)
Snack and drink given 

## 2022-05-04 NOTE — ED Notes (Signed)
Hospital dinner tray and soda in a foam cup provided to pt. All plastic removed from tray prior to pt receiving tray. Pt accepted tray and said "Thank you".
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
## 2022-05-04 NOTE — ED Notes (Signed)
Hospital meal provided, pt tolerated w/o complaints.  Waste discarded appropriately.  

## 2022-05-04 NOTE — ED Provider Notes (Signed)
Emergency Medicine Observation Re-evaluation Note  [name] is a 62 y.o. male, seen on rounds today.  Pt initially presented to the ED for complaints of Altered Mental Status  Currently, the patient is no acute distress. No issues   Physical Exam  Blood pressure (!) 164/93, pulse (!) 109, temperature (!) 97.5 F (36.4 C), temperature source Oral, resp. rate 16, SpO2 96 %.  Physical Exam General: No apparent distress Pulm: Normal WOB Psych: resting     ED Course / MDM     I have reviewed the labs performed to date as well as medications administered while in observation.  Recent changes in the last 24 hours include none  Plan   Current plan is to continue to wait for psych  placement  Patient is not under full IVC at this time.   Concha Se, MD 05/04/22 [phone]

## 2022-05-05 DIAGNOSIS — F250 Schizoaffective disorder, bipolar type: Secondary | ICD-10-CM | POA: Diagnosis not present

## 2022-05-05 NOTE — ED Provider Notes (Signed)
Emergency Medicine Observation Re-evaluation Note  [name] is a 62 y.o. male, seen on rounds today.  Pt initially presented to the ED for complaints of Altered Mental Status Currently, the patient is resting, voices no medical complaints.  Physical Exam  BP (!) 141/91 (BP Location: Left Arm)   Pulse 92   Temp 98.9 F (37.2 C) (Oral)   Resp 18   SpO2 95%  Physical Exam General: Resting in no acute distress Cardiac: No cyanosis Lungs: Equal rise and fall Psych: Not agitated  ED Course / MDM  EKG:   I have reviewed the labs performed to date as well as medications administered while in observation.  Recent changes in the last 24 hours include no events overnight.  Plan  Current plan is for psychiatric disposition.    Irean Hong, MD 05/05/22 [phone]

## 2022-05-05 NOTE — ED Notes (Signed)
Hospital meal provided, pt tolerated w/o complaints.  Waste discarded appropriately.  
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
## 2022-05-05 NOTE — ED Notes (Signed)
Pt obtained vitals on pt and pt given snack and beverage.

## 2022-05-05 NOTE — ED Notes (Signed)
VOL  PENDING  PLACEMENT 

## 2022-05-05 NOTE — ED Notes (Signed)
Pt asleep at this time, room secure.

## 2022-05-05 NOTE — ED Notes (Signed)
Pt sleeping, will obtain vitals on pt when he wakes up.

## 2022-05-05 NOTE — ED Notes (Signed)
Pt appears to be having delusional thoughts. Pt reports he is feeling very agitated and appears to be yelling at things in the room that are not there. IM medication given to patient. Pt willingly took IM medication- no manual hold required.
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
## 2022-05-06 ENCOUNTER — Inpatient Hospital Stay
Admission: AD | Admit: 2022-05-06 | Discharge: 2022-06-05 | DRG: 885 | Disposition: A | Discharge status: Home / Self Care | Payer: Medicare Other | Source: Intra-hospital | Attending: Psychiatry | Admitting: Psychiatry

## 2022-05-06 ENCOUNTER — Encounter: Payer: Self-pay | Admitting: Psychiatry

## 2022-05-06 ENCOUNTER — Other Ambulatory Visit: Payer: Self-pay

## 2022-05-06 DIAGNOSIS — Z79899 Other long term (current) drug therapy: Secondary | ICD-10-CM | POA: Diagnosis not present

## 2022-05-06 DIAGNOSIS — F17210 Nicotine dependence, cigarettes, uncomplicated: Secondary | ICD-10-CM | POA: Diagnosis present

## 2022-05-06 DIAGNOSIS — F250 Schizoaffective disorder, bipolar type: Secondary | ICD-10-CM | POA: Diagnosis not present

## 2022-05-06 DIAGNOSIS — J45909 Unspecified asthma, uncomplicated: Secondary | ICD-10-CM | POA: Diagnosis present

## 2022-05-06 DIAGNOSIS — G4700 Insomnia, unspecified: Secondary | ICD-10-CM | POA: Diagnosis present

## 2022-05-06 DIAGNOSIS — I10 Essential (primary) hypertension: Secondary | ICD-10-CM | POA: Diagnosis present

## 2022-05-06 MED ORDER — ACETAMINOPHEN 325 MG PO TABS
650.0000 mg | ORAL_TABLET | Freq: Four times a day (QID) | ORAL | Status: DC | PRN
  Administered 2022-05-07 – 2022-06-04 (×2): 650 mg via ORAL
  Filled 2022-05-06 (×3): qty 2

## 2022-05-06 MED ORDER — AMANTADINE HCL 100 MG PO CAPS
100.0000 mg | ORAL_CAPSULE | Freq: Two times a day (BID) | ORAL | Status: DC
  Administered 2022-05-06 – 2022-06-05 (×60): 100 mg via ORAL
  Filled 2022-05-06 (×62): qty 1

## 2022-05-06 MED ORDER — LORAZEPAM 2 MG/ML IJ SOLN
2.0000 mg | Freq: Three times a day (TID) | INTRAMUSCULAR | Status: DC | PRN
  Administered 2022-05-09: 2 mg via INTRAMUSCULAR
  Filled 2022-05-06: qty 1

## 2022-05-06 MED ORDER — DIPHENHYDRAMINE HCL 50 MG/ML IJ SOLN
50.0000 mg | Freq: Three times a day (TID) | INTRAMUSCULAR | Status: DC | PRN
  Administered 2022-05-07: 50 mg via INTRAMUSCULAR
  Filled 2022-05-06: qty 1

## 2022-05-06 MED ORDER — LITHIUM CARBONATE ER 300 MG PO TBCR
600.0000 mg | EXTENDED_RELEASE_TABLET | Freq: Every day | ORAL | Status: DC
  Administered 2022-05-06 – 2022-05-08 (×3): 600 mg via ORAL
  Filled 2022-05-06 (×3): qty 2

## 2022-05-06 MED ORDER — LITHIUM CARBONATE ER 300 MG PO TBCR
300.0000 mg | EXTENDED_RELEASE_TABLET | Freq: Every day | ORAL | Status: DC
  Administered 2022-05-07 – 2022-05-09 (×3): 300 mg via ORAL
  Filled 2022-05-06 (×3): qty 1

## 2022-05-06 MED ORDER — ATORVASTATIN CALCIUM 10 MG PO TABS
40.0000 mg | ORAL_TABLET | Freq: Every day | ORAL | Status: DC
  Administered 2022-05-06 – 2022-06-04 (×30): 40 mg via ORAL
  Filled 2022-05-06 (×30): qty 4

## 2022-05-06 MED ORDER — FAMOTIDINE 20 MG PO TABS
20.0000 mg | ORAL_TABLET | Freq: Once | ORAL | Status: AC
  Administered 2022-05-06: 20 mg via ORAL
  Filled 2022-05-06: qty 1

## 2022-05-06 MED ORDER — FLUTICASONE FUROATE-VILANTEROL 200-25 MCG/ACT IN AEPB
1.0000 | INHALATION_SPRAY | Freq: Every day | RESPIRATORY_TRACT | Status: DC
  Administered 2022-05-07 – 2022-06-05 (×24): 1 via RESPIRATORY_TRACT
  Filled 2022-05-06 (×2): qty 28

## 2022-05-06 MED ORDER — OLANZAPINE 5 MG PO TABS
10.0000 mg | ORAL_TABLET | Freq: Two times a day (BID) | ORAL | Status: DC
  Administered 2022-05-06 – 2022-05-07 (×2): 10 mg via ORAL
  Filled 2022-05-06 (×2): qty 2

## 2022-05-06 MED ORDER — LORAZEPAM 1 MG PO TABS
2.0000 mg | ORAL_TABLET | Freq: Three times a day (TID) | ORAL | Status: DC | PRN
  Administered 2022-05-07 – 2022-05-26 (×9): 2 mg via ORAL
  Filled 2022-05-06 (×9): qty 2

## 2022-05-06 MED ORDER — LOSARTAN POTASSIUM 25 MG PO TABS
50.0000 mg | ORAL_TABLET | Freq: Every day | ORAL | Status: DC
  Administered 2022-05-07: 50 mg via ORAL
  Filled 2022-05-06: qty 2

## 2022-05-06 MED ORDER — ALUM & MAG HYDROXIDE-SIMETH 200-200-20 MG/5ML PO SUSP
30.0000 mL | ORAL | Status: DC | PRN
  Filled 2022-05-06: qty 30

## 2022-05-06 MED ORDER — TRAZODONE HCL 50 MG PO TABS
50.0000 mg | ORAL_TABLET | Freq: Every evening | ORAL | Status: DC | PRN
  Administered 2022-05-07 – 2022-05-08 (×3): 50 mg via ORAL
  Filled 2022-05-06 (×3): qty 1

## 2022-05-06 MED ORDER — MAGNESIUM HYDROXIDE 400 MG/5ML PO SUSP: 30.0000 mL | Freq: Every day | ORAL | Status: DC | PRN

## 2022-05-06 MED ORDER — AMLODIPINE BESYLATE 5 MG PO TABS
5.0000 mg | ORAL_TABLET | Freq: Every day | ORAL | Status: DC
  Administered 2022-05-07 – 2022-06-05 (×30): 5 mg via ORAL
  Filled 2022-05-06 (×30): qty 1

## 2022-05-06 MED ORDER — DIPHENHYDRAMINE HCL 25 MG PO CAPS: 50.0000 mg | ORAL_CAPSULE | Freq: Three times a day (TID) | ORAL | Status: DC | PRN

## 2022-05-06 NOTE — BH Assessment (Signed)
Patient is to be admitted to [facility] UNIT tonight 05/06/22 after 7:30pm by Dr.  Marlou Porch .  Attending Physician will be Dr.  Marlou Porch .   Patient has been assigned to room L37, by [facility] Charge Nurse, Leavy Cella.    ER staff is aware of the admission: Koleen Nimrod, ER Secretary   Dr. Roxan Hockey, ER MD  [place], Patient's Nurse  Ethelene Browns, Patient Access.

## 2022-05-06 NOTE — ED Notes (Addendum)
Report called to trina rn geripsych nurse

## 2022-05-06 NOTE — ED Notes (Signed)
EDP Mumma to bedside to reassess pt.

## 2022-05-06 NOTE — ED Notes (Signed)
[name] with ER registration confirms everything is ready for pt to shift to gero-psych post shift change.

## 2022-05-06 NOTE — ED Notes (Addendum)
Edit: noted made in wrong pt's chart.

## 2022-05-06 NOTE — ED Notes (Signed)
Pt yelling again and very agitated in his room; pt declined all offers towards physical needs; pt willing to receive IM shot in R upper arm; pt allowed this RN to give him IM shot without security's involvement.

## 2022-05-06 NOTE — ED Notes (Signed)
Pt concerned he has gonorrhea due to yellow tent when he ejaculates. Requesting to provide a sperm sample to be tested "just incase women can get it." Pt educated on situation and he immediately asks if he called a friend yesterday about the stack of papers that can be computerized.

## 2022-05-06 NOTE — ED Notes (Signed)
Pt awake, yells for nurse to enter room while completing rounding and starts to state he needs one pen for a name change and then mumbles so much that he is unable to be understood for entirety of conversation ending

## 2022-05-06 NOTE — ED Notes (Signed)
EKG to EDP Robinson in person.  

## 2022-05-06 NOTE — ED Notes (Signed)
Attempted to complete voluntary consent and EKG but pt currently refusing since he cannot smoke in gero-psych unit.
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
## 2022-05-06 NOTE — ED Notes (Signed)
VOL/Accepted to [facility] after 7:30pm

## 2022-05-06 NOTE — ED Notes (Signed)
Pt received breakfast tray and drink from [place] NT.

## 2022-05-06 NOTE — ED Notes (Signed)
Pt yelling in his room. Offered pt medication to help him calm down, food, drink, blanket but pt declined; removed trash from pt's room from dinner; pt forgot he already had dinner. Explained to pt he will need to calm down as he is upsetting other [facility] pt's; explained if pt unable to calm down without medication he may have to receive IM shots.

## 2022-05-06 NOTE — ED Notes (Signed)
Pt received cup of water and some lip ointment in a med cup as requested.

## 2022-05-06 NOTE — ED Notes (Signed)
Pt finished showering. When leaving shower pt looked over at another pt and went off. Pt back to his room.

## 2022-05-06 NOTE — ED Notes (Signed)
EDP currently in another pt's room. EDP Roxan Hockey notified via secure chat that pt is currently requesting a nicotine patch. Awaiting reply/orders.

## 2022-05-06 NOTE — Plan of Care (Signed)
Patient new to the unit haven't had time to progress.  Problem: Education: Goal: Knowledge of General Education information will improve Description: Including pain rating scale, medication(s)/side effects and non-pharmacologic comfort measures Outcome: Not Progressing   Problem: Health Behavior/Discharge Planning: Goal: Ability to manage health-related needs will improve Outcome: Not Progressing   Problem: Clinical Measurements: Goal: Ability to maintain clinical measurements within normal limits will improve Outcome: Not Progressing   Problem: Clinical Measurements: Goal: Will remain free from infection Outcome: Not Progressing   Problem: Clinical Measurements: Goal: Diagnostic test results will improve Outcome: Not Progressing   Problem: Education: Goal: Knowledge of General Education information will improve Description: Including pain rating scale, medication(s)/side effects and non-pharmacologic comfort measures Outcome: Not Progressing   Problem: Health Behavior/Discharge Planning: Goal: Ability to manage health-related needs will improve Outcome: Not Progressing   Problem: Clinical Measurements: Goal: Ability to maintain clinical measurements within normal limits will improve Outcome: Not Progressing Goal: Will remain free from infection Outcome: Not Progressing Goal: Diagnostic test results will improve Outcome: Not Progressing Goal: Respiratory complications will improve Outcome: Not Progressing Goal: Cardiovascular complication will be avoided Outcome: Not Progressing   Problem: Activity: Goal: Risk for activity intolerance will decrease Outcome: Not Progressing   Problem: Nutrition: Goal: Adequate nutrition will be maintained Outcome: Not Progressing   Problem: Coping: Goal: Level of anxiety will decrease Outcome: Not Progressing   Problem: Elimination: Goal: Will not experience complications related to bowel motility Outcome: Not  Progressing Goal: Will not experience complications related to urinary retention Outcome: Not Progressing   Problem: Pain Managment: Goal: General experience of comfort will improve Outcome: Not Progressing   Problem: Safety: Goal: Ability to remain free from injury will improve Outcome: Not Progressing   Problem: Skin Integrity: Goal: Risk for impaired skin integrity will decrease Outcome: Not Progressing   Problem: Education: Goal: Ability to state activities that reduce stress will improve Outcome: Not Progressing   Problem: Coping: Goal: Ability to identify and develop effective coping behavior will improve Outcome: Not Progressing   Problem: Self-Concept: Goal: Ability to identify factors that promote anxiety will improve Outcome: Not Progressing Goal: Level of anxiety will decrease Outcome: Not Progressing Goal: Ability to modify response to factors that promote anxiety will improve Outcome: Not Progressing

## 2022-05-06 NOTE — ED Notes (Signed)
NT to bedside to capture EKG. Pt intermittently irritable but states will allow her to complete task.

## 2022-05-06 NOTE — ED Notes (Signed)
Pt yelling and very agitated. Will give PRN meds soon if pt unable to calm self as is scaring other pt's and may accidentally harm self or others. EDP Mumma to bedside to try and calm pt and see what his needs are.

## 2022-05-06 NOTE — BH Assessment (Signed)
Per [facility], patient to be referred out of system.  Referral information for Psychiatric Hospitalization re-faxed to;   [facility] [phone]- [phone]) No available beds  [facility] [phone]- [phone]),   [facility] [phone]),  [address] [phone]),   [facility] [phone] -or- [phone]),   [facility] [phone])  [place] [phone] or [phone]),   [facility] [phone] -[phone]) [phone]

## 2022-05-06 NOTE — ED Notes (Signed)
Pt stated "I need to go to the dentist"; this RN explained pt cannot go to dentist right now; offered pt tooth paste and brush; pt accepted and gave back to staff once done brushing his teeth. Pt missing multiple upper teeth and has no bottom teeth.
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
## 2022-05-06 NOTE — BH Assessment (Deleted)
Patient has been accepted to [facility] on tomm 05/07/22.   Patient assigned to 900 Unit Accepting physician is Dr. Sherrian Divers.  Call report to [phone].  Representative was [employer].   ER Staff is aware of it:  Rivka Barbara, ER Secretary  Dr. Arnoldo Morale, ER MD  [place], Patient's Nurse

## 2022-05-06 NOTE — ED Notes (Signed)
Pt allowed to use phone briefly.

## 2022-05-06 NOTE — ED Notes (Signed)
Psych personnel at bedside attempting to obtain voluntary consent.

## 2022-05-06 NOTE — Tx Team (Signed)
Initial Treatment Plan 05/06/2022 11:41 PM [name] [id number]    PATIENT STRESSORS: Marital or family conflict   Traumatic event     PATIENT STRENGTHS: Ability for insight  Motivation for treatment/growth    PATIENT IDENTIFIED PROBLEMS: anxiety                     DISCHARGE CRITERIA:  Ability to meet basic life and health needs Improved stabilization in mood, thinking, and/or behavior  PRELIMINARY DISCHARGE PLAN: Return to previous living arrangement  PATIENT/FAMILY INVOLVEMENT: This treatment plan has been presented to and reviewed with the patient, [name]. The patient have been given the opportunity to ask questions and make suggestions.  Foster Simpson, RN 05/06/2022, 11:41 PM

## 2022-05-06 NOTE — ED Notes (Signed)
Pt received lunch tray and drink. Pt asleep; chest rise and fall noted.

## 2022-05-06 NOTE — ED Notes (Signed)
Pt continues to talk nonsense to security guard however is no longer yelling. If pt gets agitated again and it is sustained will start with Geodon as verbally ordered by EDP Mumma.

## 2022-05-06 NOTE — ED Notes (Signed)
Pt started yelling out nonsense in his room. This RN to bedside to see if anything might help calm pt down; pt declined drink, snack, restroom, blanket; pt stated he doesn't want breakfast when it comes stating it makes him want to vomit; pt requesting "ice cream" stating it "helps with my COPD". Pt offered PO med to help him calm down and pt declined. Listened to pt as he briefly yelled about a combo of different stories that made no sense; pt then calmed down and this RN shut his room door. Blinds open.

## 2022-05-06 NOTE — ED Notes (Signed)
Pt getting ready to take a shower. [place] NT supplying pt with necessary bathroom items and a fresh set of scrubs.

## 2022-05-06 NOTE — ED Notes (Signed)
vol/pending placement.. 

## 2022-05-06 NOTE — ED Notes (Signed)
Psych team confirms pre-admit orders are all available in EHR under sign and held orders and that everything necessary for smooth transition from ER to gero-psych has been completed and should be immediately available to gero-psych staff upon pt's arrival to unit post shift change. ER registration notified of plan.

## 2022-05-06 NOTE — ED Notes (Signed)
Dinner tray provided for pt 

## 2022-05-07 ENCOUNTER — Encounter: Payer: Self-pay | Admitting: Psychiatry

## 2022-05-07 DIAGNOSIS — F250 Schizoaffective disorder, bipolar type: Secondary | ICD-10-CM | POA: Diagnosis not present

## 2022-05-07 MED ORDER — QUETIAPINE FUMARATE 200 MG PO TABS
400.0000 mg | ORAL_TABLET | Freq: Every day | ORAL | Status: DC
  Administered 2022-05-07 – 2022-05-08 (×2): 400 mg via ORAL
  Filled 2022-05-07 (×2): qty 2

## 2022-05-07 MED ORDER — OLANZAPINE 5 MG PO TABS
5.0000 mg | ORAL_TABLET | ORAL | Status: DC
  Administered 2022-05-07 – 2022-05-09 (×4): 5 mg via ORAL
  Filled 2022-05-07 (×4): qty 1

## 2022-05-07 MED ORDER — POTASSIUM CHLORIDE CRYS ER 10 MEQ PO TBCR
10.0000 meq | EXTENDED_RELEASE_TABLET | Freq: Every day | ORAL | Status: DC
  Administered 2022-05-07 – 2022-05-10 (×4): 10 meq via ORAL
  Filled 2022-05-07 (×4): qty 1

## 2022-05-07 MED ORDER — NICOTINE 14 MG/24HR TD PT24
14.0000 mg | MEDICATED_PATCH | Freq: Every day | TRANSDERMAL | Status: DC
  Administered 2022-05-10: 14 mg via TRANSDERMAL
  Filled 2022-05-07 (×4): qty 1

## 2022-05-07 MED ORDER — FLUPHENAZINE HCL 5 MG PO TABS
5.0000 mg | ORAL_TABLET | Freq: Four times a day (QID) | ORAL | Status: DC | PRN
  Administered 2022-05-08 – 2022-05-10 (×4): 5 mg via ORAL
  Filled 2022-05-07 (×6): qty 1

## 2022-05-07 MED ORDER — LOSARTAN POTASSIUM 25 MG PO TABS
100.0000 mg | ORAL_TABLET | Freq: Every day | ORAL | Status: DC
  Administered 2022-05-08 – 2022-06-05 (×29): 100 mg via ORAL
  Filled 2022-05-07 (×29): qty 4

## 2022-05-07 NOTE — Progress Notes (Signed)
Patient is A+O x1. Affect appropriate. Mood preoccupied. Patient is mildly confused but pleasant. Denies pain. Patient attended group with limited participation. Encouragement and support offered.  Trazodone 50 mg, and Ativan 2mg  adm PRN. Upon follow up, meds were found to be effective as patient appeared to be sleeping with no distress noted.  Q15 minute unit checks in place.

## 2022-05-07 NOTE — Plan of Care (Signed)
  Problem: Education: Goal: Knowledge of General Education information will improve Description: Including pain rating scale, medication(s)/side effects and non-pharmacologic comfort measures Outcome: Not Progressing   Problem: Health Behavior/Discharge Planning: Goal: Ability to manage health-related needs will improve Outcome: Not Progressing   Problem: Coping: Goal: Level of anxiety will decrease Outcome: Not Progressing   

## 2022-05-07 NOTE — BHH Counselor (Signed)
CSW called patient's group home and was informed that patient's guardian was [name] with [facility], [phone].    CSW called the number listed for [name] and received the voicemail stating that if calling to inform of admission or discharge to the hospital to call the crisis line at [phone].  CSW called the crisis line and informed of admission.  CSW also updated that assessment is needed to be completed and request that [place] call this CSW back.   CSW was informed that the crisis worker will email [name] and provide contact information so that she may follow up.   CSW will attempt again at a later date.   [name], MSW, LCSW 05/07/2022 4:10 PM

## 2022-05-07 NOTE — Group Note (Signed)
Date:  05/07/2022 Time:  8:30 PM  Group Topic/Focus:  Goals Group:   The focus of this group is to help patients establish daily goals to achieve during treatment and discuss how the patient can incorporate goal setting into their daily lives to aide in recovery.    Participation Level:  Minimal  Participation Quality:  Attentive  Affect:  Flat  Cognitive:  Oriented  Insight: Lacking  Engagement in Group:  None  Modes of Intervention:  Rapport Building  Additional Comments:    [name] 05/07/2022, 8:30 PM

## 2022-05-07 NOTE — Progress Notes (Signed)
Patient seen eating breakfast. He began coughing and spitting up food. Patient monitored by staff throughout the rest of breakfast.  During lunchtime, patient began coughing up his burger. MD notified that he was having trouble chewing and swallowing his meals.

## 2022-05-07 NOTE — BH IP Treatment Plan (Signed)
Interdisciplinary Treatment and Diagnostic Plan Update  05/07/2022 Time of Session: 10:30AM [name] MRN: [id number]  Principal Diagnosis: Schizoaffective disorder, bipolar type [facility])  Secondary Diagnoses: Principal Problem:   Schizoaffective disorder, bipolar type (HCC)   Current Medications:  Current Facility-Administered Medications  Medication Dose Route Frequency Provider Last Rate Last Admin   acetaminophen (TYLENOL) tablet 650 mg  650 mg Oral Q6H PRN Willeen Cass, Christal H, NP   650 mg at 05/07/22 0832   alum & mag hydroxide-simeth (MAALOX/MYLANTA) 200-200-20 MG/5ML suspension 30 mL  30 mL Oral Q4H PRN Bennett, Christal H, NP       amantadine (SYMMETREL) capsule 100 mg  100 mg Oral BID Bennett, Christal H, NP   100 mg at 05/07/22 0813   amLODipine (NORVASC) tablet 5 mg  5 mg Oral Daily Bennett, Christal H, NP   5 mg at 05/07/22 0813   atorvastatin (LIPITOR) tablet 40 mg  40 mg Oral QHS Bennett, Christal H, NP   40 mg at 05/06/22 2138   fluPHENAZine (PROLIXIN) tablet 5 mg  5 mg Oral Q6H PRN Sarina Ill, DO       fluticasone furoate-vilanterol (BREO ELLIPTA) 200-25 MCG/ACT 1 puff  1 puff Inhalation Daily Bennett, Christal H, NP   1 puff at 05/07/22 0814   lithium carbonate (LITHOBID) ER tablet 300 mg  300 mg Oral Daily Bennett, Christal H, NP   300 mg at 05/07/22 0814   lithium carbonate (LITHOBID) ER tablet 600 mg  600 mg Oral QHS Bennett, Christal H, NP   600 mg at 05/06/22 2138   LORazepam (ATIVAN) tablet 2 mg  2 mg Oral TID PRN Thurston Hole H, NP   2 mg at 05/07/22 4098   Or   LORazepam (ATIVAN) injection 2 mg  2 mg Intramuscular TID PRN Hampton Abbot, NP       [START ON 05/08/2022] losartan (COZAAR) tablet 100 mg  100 mg Oral Daily Sarina Ill, DO       magnesium hydroxide (MILK OF MAGNESIA) suspension 30 mL  30 mL Oral Daily PRN Bennett, Christal H, NP       nicotine (NICODERM CQ - dosed in mg/24 hours) patch 14 mg  14 mg Transdermal Q0600  Sarina Ill, DO       OLANZapine [facility]) tablet 5 mg  5 mg Oral [id number] Herrick, Richard Edward, DO       potassium chloride (KLOR-CON M) CR tablet 10 mEq  10 mEq Oral Daily Sarina Ill, DO       QUEtiapine (SEROQUEL) tablet 400 mg  400 mg Oral QHS Sarina Ill, DO       traZODone (DESYREL) tablet 50 mg  50 mg Oral QHS PRN Bennett, Christal H, NP   50 mg at 05/07/22 0012   PTA Medications: Medications Prior to Admission  Medication Sig Dispense Refill Last Dose   amantadine (SYMMETREL) 100 MG capsule Take 100 mg by mouth 2 (two) times daily.   05/06/2022   atorvastatin (LIPITOR) 40 MG tablet Take 1 tablet (40 mg total) by mouth at bedtime. 30 tablet 1 05/06/2022   lithium carbonate (LITHOBID) 300 MG CR tablet Take 1 tablet (300 mg total) by mouth 3 (three) times daily. (Patient taking differently: Take 600 mg by mouth at bedtime.) 90 tablet 1 05/06/2022   amLODipine (NORVASC) 5 MG tablet Take 1 tablet (5 mg total) by mouth daily. 30 tablet 1 Unknown   fluticasone furoate-vilanterol (BREO ELLIPTA) 200-25 MCG/ACT AEPB Inhale  1 puff into the lungs daily. 1 each 1 Unknown   losartan (COZAAR) 50 MG tablet Take 1 tablet (50 mg total) by mouth daily. 30 tablet 1 Unknown   OLANZapine (ZYPREXA) 10 MG tablet Take 1 tablet (10 mg total) by mouth daily. (Patient not taking: Reported on 05/02/2022) 30 tablet 1 Unknown   OLANZapine-Samidorphan (LYBALVI) 10-10 MG TABS Take 1 tablet by mouth daily.   Unknown   QUEtiapine (SEROQUEL) 200 MG tablet Take 1 tablet (200 mg total) by mouth at bedtime. (Patient not taking: Reported on 05/02/2022) 30 tablet 1 Unknown   risperiDONE (RISPERDAL M-TABS) 1 MG disintegrating tablet Take 1 tablet (1 mg total) by mouth 2 (two) times daily as needed (agitation). 60 tablet 1 Unknown    Patient Stressors: Marital or family conflict   Traumatic event    Patient Strengths: Ability for insight  Motivation for treatment/growth   Treatment  Modalities: Medication Management, Group therapy, Case management,  1 to 1 session with clinician, Psychoeducation, Recreational therapy.   Physician Treatment Plan for Primary Diagnosis: Schizoaffective disorder, bipolar type (HCC) Long Term Goal(s): Improvement in symptoms so as ready for discharge   Short Term Goals: Ability to identify changes in lifestyle to reduce recurrence of condition will improve Ability to verbalize feelings will improve Ability to disclose and discuss suicidal ideas Ability to demonstrate self-control will improve Ability to identify and develop effective coping behaviors will improve Ability to maintain clinical measurements within normal limits will improve Compliance with prescribed medications will improve Ability to identify triggers associated with substance abuse/mental health issues will improve  Medication Management: Evaluate patient's response, side effects, and tolerance of medication regimen.  Therapeutic Interventions: 1 to 1 sessions, Unit Group sessions and Medication administration.  Evaluation of Outcomes: Not Met  Physician Treatment Plan for Secondary Diagnosis: Principal Problem:   Schizoaffective disorder, bipolar type (HCC)  Long Term Goal(s): Improvement in symptoms so as ready for discharge   Short Term Goals: Ability to identify changes in lifestyle to reduce recurrence of condition will improve Ability to verbalize feelings will improve Ability to disclose and discuss suicidal ideas Ability to demonstrate self-control will improve Ability to identify and develop effective coping behaviors will improve Ability to maintain clinical measurements within normal limits will improve Compliance with prescribed medications will improve Ability to identify triggers associated with substance abuse/mental health issues will improve     Medication Management: Evaluate patient's response, side effects, and tolerance of medication  regimen.  Therapeutic Interventions: 1 to 1 sessions, Unit Group sessions and Medication administration.  Evaluation of Outcomes: Not Met   RN Treatment Plan for Primary Diagnosis: Schizoaffective disorder, bipolar type (HCC) Long Term Goal(s): Knowledge of disease and therapeutic regimen to maintain health will improve  Short Term Goals: Ability to demonstrate self-control, Ability to participate in decision making will improve, Ability to verbalize feelings will improve, Ability to disclose and discuss suicidal ideas, Ability to identify and develop effective coping behaviors will improve, and Compliance with prescribed medications will improve  Medication Management: RN will administer medications as ordered by provider, will assess and evaluate patient's response and provide education to patient for prescribed medication. RN will report any adverse and/or side effects to prescribing provider.  Therapeutic Interventions: 1 on 1 counseling sessions, Psychoeducation, Medication administration, Evaluate responses to treatment, Monitor vital signs and CBGs as ordered, Perform/monitor CIWA, COWS, AIMS and Fall Risk screenings as ordered, Perform wound care treatments as ordered.  Evaluation of Outcomes: Not Met   LCSW Treatment  Plan for Primary Diagnosis: Schizoaffective disorder, bipolar type (HCC) Long Term Goal(s): Safe transition to appropriate next level of care at discharge, Engage patient in therapeutic group addressing interpersonal concerns.  Short Term Goals: Engage patient in aftercare planning with referrals and resources, Increase social support, Increase ability to appropriately verbalize feelings, Increase emotional regulation, Facilitate acceptance of mental health diagnosis and concerns, and Increase skills for wellness and recovery  Therapeutic Interventions: Assess for all discharge needs, 1 to 1 time with Social worker, Explore available resources and support systems, Assess  for adequacy in community support network, Educate family and significant other(s) on suicide prevention, Complete Psychosocial Assessment, Interpersonal group therapy.  Evaluation of Outcomes: Not Met   Progress in Treatment: Attending groups: Yes. Participating in groups: Yes. Taking medication as prescribed: Yes. Toleration medication: Yes. Family/Significant other contact made: No, will contact:  once permission is given.  Patient understands diagnosis: No. Discussing patient identified problems/goals with staff: No. Medical problems stabilized or resolved: Yes. Denies suicidal/homicidal ideation: Yes. Issues/concerns per patient self-inventory: No. Other: none  New problem(s) identified: No, Describe:  none  New Short Term/Long Term Goal(s):  elimination of symptoms of psychosis, medication management for mood stabilization; elimination of SI thoughts; development of comprehensive mental wellness/sobriety plan.   Patient Goals:  Patient unable to participate in treatment team.  Patient was asleep following administration of medication during agitation protocol.   Discharge Plan or Barriers: CSW to assist with discharge plans.    Reason for Continuation of Hospitalization: Anxiety Delusions  Depression Hallucinations Medical Issues Medication stabilization  Estimated Length of Stay: 1-7 days  Last 3 [place] Suicide Severity Risk Score: Flowsheet Row Admission (Current) from 05/06/2022 in [facility] BEHAVIORAL MEDICINE ED from 05/02/2022 in [facility] Emergency Department at [facility] Admission (Discharged) from 05/02/2021 in [facility] INPATIENT BEHAVIORAL MEDICINE  C-SSRS RISK CATEGORY No Risk No Risk No Risk       Last PHQ 2/9 Scores:     No data to display          Scribe for Treatment Team: Harden Mo, LCSW 05/07/2022 3:01 PM

## 2022-05-07 NOTE — Progress Notes (Addendum)
Admission Note: PT is 62 year old male admitted to the unit Voluntarily for AMS. Disorganized and confused. Tangential with FOI. Delusional thoughts. Pt is easily agitated and irritable. Nonsensical and mumbled speech noted. H/o Schizophrenia and HTN, Pt states he was brought in for jumping out a window and running from the cops. Skin warm, dry and intact. Pt search and no contraband found. Snacks offered and accepted. Denies SI/HI/AV/H. No c/o pain/discomfort noted. Pt had no questions or concerns.

## 2022-05-07 NOTE — Progress Notes (Signed)
Patient is loud and intrusive this morning. He is restless and is back and forth at the nurses station. Patient has disorganized, slurred speech and is difficult to understand. He is labile and easily becomes agitated. Patient became unsteady when he stood up. When this writer reached out to steady the patient, patient became angry and began yelling in the dayroom.  Patient needs to be constantly redirected throughout the morning. Patient was compliant with scheduled medications and was also given Tylenol for pain and Ativan for agitation.   Shortly after taking Ativan, patient was found without any clothes on laying in the bed. He then walked to the bathroom and was seen setting his blanket down on the bathroom floor and laying down on the floor. Patient was instructed to return to his bed. Patient needed help from two staff members to stand up and walk back to his bed. About 2 hours later, during a round, patient was found digging his hand in his bathroom toilet and putting washcloths in the toilet. Staff also found a water bottle with soap in it in his room. Patient required 2 staff members to help him stand and brought to the shower. Patient is very unsteady at this time. MD notified and 1:1 order obtained at 1310.

## 2022-05-07 NOTE — Progress Notes (Signed)
Pt is disorganized. RIS. Yelling in his room. Trazodone 50 mg po for sleep and Ativan 2 mg po given PRN at 0021 with min effect.   Pt remains agitated, irritable and continues to yell in his room. Disruptive on the unit. Benadryl 50 mg IM injection given PRN at 0131. Tol well. Pt slept at short intervals throughout the night. No c/o pain/discomfort noted. Plan of care continued.
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
## 2022-05-07 NOTE — Group Note (Signed)
[facility] LCSW Group Therapy Note   Group Date: 05/07/2022 Start Time: 1500 End Time: 1530   Type of Therapy/Topic:  Group Therapy:  Emotion Regulation  Participation Level:  None   Mood:  Description of Group:    The purpose of this group is to assist patients in learning to regulate negative emotions and experience positive emotions. Patients will be guided to discuss ways in which they have been vulnerable to their negative emotions. These vulnerabilities will be juxtaposed with experiences of positive emotions or situations, and patients challenged to use positive emotions to combat negative ones. Special emphasis will be placed on coping with negative emotions in conflict situations, and patients will process healthy conflict resolution skills.  Therapeutic Goals: Patient will identify two positive emotions or experiences to reflect on in order to balance out negative emotions:  Patient will label two or more emotions that they find the most difficult to experience:  Patient will be able to demonstrate positive conflict resolution skills through discussion or role plays:   Summary of Patient Progress: Patient was present in group.  Patient was disorganized and mumbled several times in group.  CSW unable to understand much of speech at this time. Patient did state that he liked "[name]" and "Marta Lamas" when asked favorite musician and song respectively.    Therapeutic Modalities:   Cognitive Behavioral Therapy Feelings Identification Dialectical Behavioral Therapy   Harden Mo, LCSW

## 2022-05-07 NOTE — Group Note (Signed)
Recreation Therapy Group Note   Group Topic:Other  Group Date: 05/07/2022 Start Time: 1400 End Time: 1435 Facilitators: Rosina Lowenstein, LRT, CTRS Location: Courtyard  Group Description: Emotional Check in. Patient sat and talked with LRT about how they are doing and whatever else is on their mind. LRT provided active listening, reassurance and encouragement. Pts were given the opportunity to do seated exercise, play with a deck of cards, or listen to music while enjoying fresh air and sunlight in the courtyard. Pts did not seem interested in any of them and preferred talking with LRT instead.   Goal Area(s) Addressed: Patient will engage in conversation with LRT. Patient will communicate their wants, needs, or questions.  Patient will practice a new coping skill of "talking to someone". Patient will practice a leisure interest of listening to music and or going outside for fresh air and sunlight.   Affect/Mood: N/A   Participation Level: Did not attend    Clinical Observations/Individualized Feedback: Ildefonso did not attend group due to resting in the dayroom.  Plan: Continue to engage patient in RT group sessions 2-3x/week.   Rosina Lowenstein, LRT, CTRS 05/07/2022 2:38 PM

## 2022-05-07 NOTE — Progress Notes (Signed)
Pt alert and awake with confusion. Speech Mumbled. Amb with walker. Pt requires frequent redirections. OOB to DR watching tv. No behavior issues noted at this time. No c/o pain/discomfort noted. Plan of care continued.

## 2022-05-07 NOTE — [facility] Suicide Risk Assessment (Signed)
[name] Admission Suicide Risk Assessment   Nursing information obtained from:  Patient Demographic factors:  Male Current Mental Status:  NA Loss Factors:  NA Historical Factors:  NA Risk Reduction Factors:  NA  Total Time spent with patient: 1 hour Principal Problem: Schizoaffective disorder, bipolar type (HCC) Diagnosis:  Principal Problem:   Schizoaffective disorder, bipolar type (HCC)  Subjective Data: 62 yo male presented to the ED from [name], for changes in behaviors, history of schizoaffective d/o. He said one unintelligible word on assessment. He was banging on his walls earlier this morning and had to have agitation medications to calm. On assessment, he had the blanket over his head and did show one eye when requested to see his face. His group home owner contacted, [name], who reported he is "usually a quiet man of few words". Prior to admission, he was agitated with increase in energy, delusional. His MD wanted him to come to the ED to be evaluated for his recent UTI and Lithium level (0.81, WDL but on the lower end for his normal of 1.02-1.13). Increased his Lithium by 300 mg in the am. His Zyprexa combination pill, Lybalvi, is not available. Changed to 10 mg BID of Zyprexa. His U/A was clear. Medications will continue to be managed until he returns to baseline to return to his group home.   Continued Clinical Symptoms:  Alcohol Use Disorder Identification Test Final Score (AUDIT): 0 The "Alcohol Use Disorders Identification Test", Guidelines for Use in Primary Care, Second Edition.  World Science writer [name]). Score between 0-7:  no or low risk or alcohol related problems. Score between 8-15:  moderate risk of alcohol related problems. Score between 16-19:  high risk of alcohol related problems. Score 20 or above:  warrants further diagnostic evaluation for alcohol dependence and treatment.   CLINICAL FACTORS:   Schizophrenia:   Paranoid or  undifferentiated type   Musculoskeletal: Strength & Muscle Tone: within normal limits Gait & Station: normal Patient leans: N/A  Psychiatric Specialty Exam:  Presentation  General Appearance: No data recorded Eye Contact:No data recorded Speech:No data recorded Speech Volume:No data recorded Handedness:No data recorded  Mood and Affect  Mood:No data recorded Affect:No data recorded  Thought Process  Thought Processes:No data recorded Descriptions of Associations:No data recorded Orientation:No data recorded Thought Content:No data recorded History of Schizophrenia/Schizoaffective disorder:No data recorded Duration of Psychotic Symptoms:No data recorded Hallucinations:No data recorded Ideas of Reference:No data recorded Suicidal Thoughts:No data recorded Homicidal Thoughts:No data recorded  Sensorium  Memory:No data recorded Judgment:No data recorded Insight:No data recorded  Executive Functions  Concentration:No data recorded Attention Span:No data recorded Recall:No data recorded Fund of Knowledge:No data recorded Language:No data recorded  Psychomotor Activity  Psychomotor Activity:No data recorded  Assets  Assets:No data recorded  Sleep  Sleep:No data recorded    Blood pressure (!) 149/82, pulse 81, temperature 98.4 F (36.9 C), temperature source Oral, resp. rate 18, height 5\' 10"  (1.778 m), weight 66.7 kg, SpO2 100 %. Body mass index is 21.09 kg/m.   COGNITIVE FEATURES THAT CONTRIBUTE TO RISK:  Loss of executive function    SUICIDE RISK:   Minimal: No identifiable suicidal ideation.  Patients presenting with no risk factors but with morbid ruminations; may be classified as minimal risk based on the severity of the depressive symptoms  PLAN OF CARE: See orders  I certify that inpatient services furnished can reasonably be expected to improve the patient's condition.   Dustin Ill, DO 05/07/2022,  10:06 AM

## 2022-05-07 NOTE — Group Note (Signed)
Date:  05/07/2022 Time:  4:44 PM  Group Topic/Focus: Gratitude exercise/ worksheet      Participation Level:  Did Not Attend   [name] 05/07/2022, 4:44 PM

## 2022-05-08 DIAGNOSIS — F250 Schizoaffective disorder, bipolar type: Secondary | ICD-10-CM | POA: Diagnosis not present

## 2022-05-08 NOTE — Plan of Care (Signed)
Patient has remained psychotic, yelling, acting bizarre and laughing inappropriately. Not aggressive. Remains on 1:1 level of safety precautions.

## 2022-05-08 NOTE — Progress Notes (Signed)
One to One Note  7am- Bedroom  8am- Hallway  9am- Dayroom  10am- Dayroom  11am- Bedroom  12pm- Hallway  1pm- hallway  2pm- Dayroom  3pm- Bedroom  4pm- Bedroom  5pm- Dayroom  6pm- Bedroom
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
## 2022-05-08 NOTE — [facility] Suicide Risk Assessment (Signed)
[facility] INPATIENT:  Family/Significant Other Suicide Prevention Education  Suicide Prevention Education:  Education Completed; [facility] [name] [phone] has been identified by the patient as the family member/significant other with whom the patient will be residing, and identified as the person(s) who will aid the patient in the event of a mental health crisis (suicidal ideations/suicide attempt).  With written consent from the patient, the family member/significant other has been provided the following suicide prevention education, prior to the and/or following the discharge of the patient.  The suicide prevention education provided includes the following: Suicide risk factors Suicide prevention and interventions National Suicide Hotline telephone number [facility] assessment telephone number [facility] Emergency Assistance 911 [facility] and/or Residential Mobile Crisis Unit telephone number  Request made of family/significant other to: Remove weapons (e.g., guns, rifles, knives), all items previously/currently identified as safety concern.   Remove drugs/medications (over-the-counter, prescriptions, illicit drugs), all items previously/currently identified as a safety concern.  The family member/significant other verbalizes understanding of the suicide prevention education information provided.  The family member/significant other agrees to remove the items of safety concern listed above.  Harden Mo 05/08/2022, 3:20 PM

## 2022-05-08 NOTE — Group Note (Signed)
Recreation Therapy Group Note   Group Topic:Self-Esteem  Group Date: 05/08/2022 Start Time: 1400 End Time: 1500 Facilitators: Rosina Lowenstein, LRT, CTRS Location:  Dayroom  Group Description: Positive Focus. Patients are given a handout that has 9 different boxes on it. Each box has a different prompt on it that requires you to identify and add something positive about themselves. LRT encourages pts to share at least two of their boxes to the group. LRT and pts discuss the importance of "thinking positive", self-esteem and how they can apply it to their everyday life post-discharge.  Goal Area(s) Addressed: Patient will identify positive qualities about themselves. Patient will identify definition of "self-esteem". Patients will identify the difference between positive and negative self-esteem.  Patient will recite positive qualities and affirmations aloud to the group.   Affect/Mood: N/A   Participation Level: Did not attend    Clinical Observations/Individualized Feedback: [name] did not attend group due to being in his room.  Plan: Continue to engage patient in RT group sessions 2-3x/week.   Rosina Lowenstein, LRT, CTRS 05/08/2022 3:10 PM
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
## 2022-05-08 NOTE — BHH Counselor (Signed)
CSW spoke with [name] at patient's group home.  He reports that patient can NOT stay at the home, he has given patient a 30 day notice.  He reports that the patient's guardian has not received the notice as of yet.    He reports that he has found alternative placement for patient.  He reports that patient can return to the home, once better, prior to transition to the new group home.   He reports that at baseline patient is clear, able to engage in causal conversation and speech is not jumbled/mumbled.   He reports that when patient is talkative, praying to the devil, intrusive, random impulsive crying and unclear in speech that the patient is not at baseline.   He reports that patient used to smoke "300 cigarettes" daily but he has decreased it to "about 100".   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  He reports that patient would be able to maintain a job if well, however, could not live independently because he would not pay his bills in lieu of having sodas and cigarettes.  He reports that patient, when well, could also administer his own medications.    He reports that patient last received his shot on April 24th "I think it was lithium".  He was not sure of medications.    Patient receives monthly injection at [facility] and has SUD therapy with [facility].  [name], MSW, LCSW 05/08/2022 3:59 PM

## 2022-05-08 NOTE — Progress Notes (Signed)
   05/08/22 0738  Psych Admission Type (Psych Patients Only)  Admission Status Voluntary  Psychosocial Assessment  Patient Complaints Confusion;Disorientation  Eye Contact Brief  Facial Expression Blank  Affect Irritable;Blunted;Anxious  Speech Incoherent;Loud;Slurred  Interaction Intrusive;Demanding  Motor Activity Unsteady;Hyperactive;Pacing;Restless  Appearance/Hygiene In scrubs  Behavior Characteristics Impulsive  Mood Preoccupied  Thought Process  Coherency Disorganized;Loose associations  Content Delusions  Delusions Grandeur  Perception UTA  Hallucination None reported or observed  Judgment Poor  Confusion Moderate  Danger to Self  Current suicidal ideation? Denies  Danger to Others  Danger to Others None reported or observed

## 2022-05-08 NOTE — BHH Counselor (Signed)
Adult Comprehensive Assessment  Patient ID: [name], male   DOB: [birth year], 62 y.o.   MRN: [id number]  Information Source: Information source: Patient (Group Home owner [name] provided information for assessment.  [name], [facility], requested CSW complete PSA with him.)  Current Stressors:  Patient states their primary concerns and needs for treatment are:: "medication adjustment, start saying things that don't make sense" Patient states their goals for this hospitilization and ongoing recovery are:: Unable to assess. Educational / Learning stressors: Unable to assess. Employment / Job issues: Unable to assess. Family Relationships: Unable to assess. Financial / Lack of resources (include bankruptcy): Unable to assess. Housing / Lack of housing: Unable to assess. Physical health (include injuries & life threatening diseases): Unable to assess. Social relationships: Unable to assess. Substance abuse: Unable to assess. Bereavement / Loss: Unable to assess.  Living/Environment/Situation:  Living Arrangements: Group Home (Group Home reports that patient has been served a notice, however, he has found alternative placement for the patient.) Who else lives in the home?: Other residents How long has patient lived in current situation?: "15 years"  Family History:  Does patient have children?: Yes How many children?: 2 How is patient's relationship with their children?: [name] reports that pt has two children with whom he has no relationship with.  He also reports that this hasn't been confirmed.  Childhood History:  By whom was/is the patient raised?:  [name] reports that the patient's family are all deceased.) Additional childhood history information: Unable to assess. Description of patient's relationship with caregiver when they were a child: Unable to assess. Patient's description of current relationship with people who raised him/her: Unable to assess. How  were you disciplined when you got in trouble as a child/adolescent?: Unable to assess.  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  Alcohol/Substance Abuse:   What has been your use of drugs/alcohol within the last 12 months?: Group Home reports history of cocaine use and tobacco use.  Reports tobacco use at 300 cigarettes daily but that has been decreased to 100.  Social Support System:   Patient's Community Support System: Fair Museum/gallery exhibitions officer System: "Day treatment program and substance use disorder group" How does patient's faith help to cope with current illness?: "goes to church"  Leisure/Recreation:   Do You Have Hobbies?: Yes Leisure and Hobbies: "smoking and drinking"  Strengths/Needs:   What is the patient's perception of their strengths?: Unable to assess. Patient states they can use these personal strengths during their treatment to contribute to their recovery: Unable to assess. Patient states these barriers may affect/interfere with their treatment: Unable to assess. Patient states these barriers may affect their return to the community: Unable to assess.  Discharge Plan:   Currently receiving community mental health services: Yes (From Whom) ([facility] (medications), [facility] (substance use)) Does patient have access to transportation?: Yes Does patient have financial barriers related to discharge medications?: No Will patient be returning to same living situation after discharge?:  Yes  Summary/Recommendations:   Summary and Recommendations (to be completed by the evaluator): Patient is a 61 year old male from [address]  [place], [place].  Patient presents to the hospital for concerns for medication adjustments.  Patient also has a guardian with [facility], [place].  Patient is a resident of a group home, who reports that patients' medications need to be adjusted.  He reports that patient has experienced delusions poor judgement and hallucinations.  Group home reports that the patient's baseline is clear speech, quiet, and able to engage in casual conversation.  Patient's current disposition is nonsensical speech, mumbled speech.  He is often observed to cry at random intervals with no clear cause.  Recommendations include: crisis stabilization, therapeutic milieu, encourage group attendance and participation, medication management for mood stabilization and development of comprehensive mental wellness plan.  [name]. 05/08/2022

## 2022-05-08 NOTE — Progress Notes (Signed)
Patient has remained confused, delusional, anxious and restless.  He is frequently redirected. Remains on 1:1 precautions for safety.

## 2022-05-08 NOTE — Progress Notes (Signed)
   05/07/22 2000  Psych Admission Type (Psych Patients Only)  Admission Status Voluntary  Psychosocial Assessment  Patient Complaints Irritability  Eye Contact Fair  Facial Expression Animated  Affect Appropriate to circumstance  Speech Slurred  Interaction Demanding  Motor Activity Fidgety;Hyperactive  Appearance/Hygiene In scrubs  Behavior Characteristics Restless  Mood Preoccupied;Pleasant  Thought Process  Coherency Disorganized;Flight of ideas  Content Blaming others  Delusions Paranoid  Perception UTA  Hallucination None reported or observed  Judgment Poor  Confusion Mild  Danger to Self  Current suicidal ideation? Denies  Danger to Others  Danger to Others None reported or observed

## 2022-05-08 NOTE — Plan of Care (Signed)
  Problem: Education: Goal: Knowledge of General Education information will improve Description: Including pain rating scale, medication(s)/side effects and non-pharmacologic comfort measures Outcome: Not Progressing   Problem: Health Behavior/Discharge Planning: Goal: Ability to manage health-related needs will improve Outcome: Not Progressing   Problem: Clinical Measurements: Goal: Ability to maintain clinical measurements within normal limits will improve Outcome: Not Progressing Goal: Will remain free from infection Outcome: Not Progressing Goal: Diagnostic test results will improve Outcome: Not Progressing Goal: Respiratory complications will improve Outcome: Not Progressing Goal: Cardiovascular complication will be avoided Outcome: Not Progressing   Problem: Activity: Goal: Risk for activity intolerance will decrease Outcome: Not Progressing   Problem: Nutrition: Goal: Adequate nutrition will be maintained Outcome: Not Progressing   Problem: Coping: Goal: Level of anxiety will decrease Outcome: Not Progressing   Problem: Elimination: Goal: Will not experience complications related to bowel motility Outcome: Not Progressing Goal: Will not experience complications related to urinary retention Outcome: Not Progressing   Problem: Pain Managment: Goal: General experience of comfort will improve Outcome: Not Progressing   Problem: Safety: Goal: Ability to remain free from injury will improve Outcome: Not Progressing   Problem: Self-Concept: Goal: Ability to identify factors that promote anxiety will improve Outcome: Not Progressing Goal: Level of anxiety will decrease Outcome: Not Progressing Goal: Ability to modify response to factors that promote anxiety will improve Outcome: Not Progressing   Problem: Coping: Goal: Ability to identify and develop effective coping behavior will improve Outcome: Not Progressing   Problem: Education: Goal: Ability to state  activities that reduce stress will improve Outcome: Not Progressing

## 2022-05-09 DIAGNOSIS — F250 Schizoaffective disorder, bipolar type: Secondary | ICD-10-CM | POA: Diagnosis not present

## 2022-05-09 MED ORDER — TRAZODONE HCL 100 MG PO TABS
100.0000 mg | ORAL_TABLET | Freq: Every evening | ORAL | Status: DC | PRN
  Administered 2022-05-09: 100 mg via ORAL
  Filled 2022-05-09 (×2): qty 1

## 2022-05-09 MED ORDER — RISPERIDONE 1 MG PO TABS
1.0000 mg | ORAL_TABLET | ORAL | Status: DC
  Administered 2022-05-09 – 2022-05-10 (×2): 1 mg via ORAL
  Filled 2022-05-09 (×2): qty 1

## 2022-05-09 MED ORDER — CHLORPROMAZINE HCL 50 MG PO TABS
50.0000 mg | ORAL_TABLET | Freq: Every day | ORAL | Status: DC
  Administered 2022-05-09: 50 mg via ORAL
  Filled 2022-05-09: qty 1

## 2022-05-09 NOTE — Evaluation (Signed)
Physical Therapy Evaluation Patient Details Name: [name] MRN: [id number] DOB: 05/28/1960 Today's Date: 05/09/2022  History of Present Illness  Marisela Saltarelli is a 62yoM who comes to [facility] on 4/26 from group home with acute AMS, delusions, now admitted to geropsych unit. Staff concerned of UTI or lithium levels being off, both are normal here. Owner of group home describes baseline as 'quiet man of few words.' PMH schiphrenia, HTN. ~3 days post arrival, pt started having garbled speech, shuffling gait, frequent falls/sitting in floor. At baseline pt does not use and DME for mobility.  Clinical Impression  Pt seen in courtyard with safety sitter nearby. Pt is asleep, startled upon waking. He's fairly pleasant, interactive, but his garbled speech makes it difficult to understand him- sitter is able to help with this some. Author explained purpose of visit, pt agreeable to demonstrate his mobility a little he has more close to 10 complete LOB extending beyond his ability to perform corrective righting. He stands and walks without any indication of acute weakness, however he has severe disorientation in the sagittal plane, delayed and ineffective ankle strategy and trunk strategy righting. More concerning are gross motor impairments festination of gait with poor regulation of gait speed, shuffle evolving in speed progressively into a continuous forward LOB- his trunk is arrested several times by athor and/or sitter. Balance is actually better controlled with a 180 degree turnaround, but start and stop of gait create several more LOB returning to start. Discussed with sitter and RN use of a weighted walker for this patient to help with forward gait ataxia, but explained it would not make him less likely to sustain a backwards fall. Will FU with MD regarding cluster of symptoms to r/o drug induced movement disorder v stroke workup to explain motor system impairments.      Recommendations for follow up  therapy are one component of a multi-disciplinary discharge planning process, led by the attending physician.  Recommendations may be updated based on patient status, additional functional criteria and insurance authorization.  Follow Up Recommendations Can patient physically be transported by private vehicle: No     Assistance Recommended at Discharge Frequent or constant Supervision/Assistance  Patient can return home with the following  Two people to help with walking and/or transfers;Two people to help with bathing/dressing/bathroom    Equipment Recommendations    Recommendations for Other Services       Functional Status Assessment Patient has had a recent decline in their functional status and demonstrates the ability to make significant improvements in function in a reasonable and predictable amount of time.     Precautions / Restrictions Precautions Precautions: Fall Restrictions Weight Bearing Restrictions: No      Mobility  Bed Mobility                    Transfers Overall transfer level: Needs assistance Equipment used: None Transfers: Sit to/from Stand Sit to Stand: Min guard           General transfer comment: immediately nearly faceplants forward, arrested by safety sitter    Ambulation/Gait   Gait Distance (Feet): 70 Feet Assistive device: None Gait Pattern/deviations: Festinating, Shuffle       General Gait Details: trying to use restrain and control, but largely limited; in static stance postural retropulsion with unsuccessful bila tankle strategy DF to cease posterior sway; in walking, has festiantion which briefly turns into normal walking when at a faster speed and cued to pickup feet; in walking loses  control forward several times.  Stairs            Wheelchair Mobility    Modified Rankin (Stroke Patients Only)       Balance Overall balance assessment:  (awful x3-4 days)                                            Pertinent Vitals/Pain      Home Living Family/patient expects to be discharged to:: Group home Living Arrangements: Group Home                 Additional Comments: typically mobile without device    Prior Function                       Hand Dominance        Extremity/Trunk Assessment                Communication      Cognition Arousal/Alertness: Lethargic Behavior During Therapy: Flat affect Overall Cognitive Status: Impaired/Different from baseline                                          General Comments      Exercises     Assessment/Plan    PT Assessment Patient needs continued PT services  PT Problem List Decreased activity tolerance;Decreased balance;Decreased mobility;Decreased cognition;Decreased safety awareness       PT Treatment Interventions Gait training;Neuromuscular re-education;Functional mobility training;Therapeutic activities;Balance training;Patient/family education;Cognitive remediation    PT Goals (Current goals can be found in the Care Plan section)  Acute Rehab PT Goals PT Goal Formulation: Patient unable to participate in goal setting    Frequency Min 3X/week     Co-evaluation               AM-PAC PT "6 Clicks" Mobility  Outcome Measure Help needed turning from your back to your side while in a flat bed without using bedrails?: A Little Help needed moving from lying on your back to sitting on the side of a flat bed without using bedrails?: A Little Help needed moving to and from a bed to a chair (including a wheelchair)?: A Little Help needed standing up from a chair using your arms (e.g., wheelchair or bedside chair)?: A Little Help needed to walk in hospital room?: A Lot Help needed climbing 3-5 steps with a railing? : A Lot 6 Click Score: 16    End of Session Equipment Utilized During Treatment: Gait belt Activity Tolerance: Patient tolerated treatment well Patient  left: in chair;with nursing/sitter in room Nurse Communication: Mobility status PT Visit Diagnosis: Unsteadiness on feet (R26.81);Other symptoms and signs involving the nervous system (R29.898)    Time: 1610-9604 PT Time Calculation (min) (ACUTE ONLY): 20 min   Charges:   PT Evaluation $PT Eval Moderate Complexity: 1 Mod         4:51 PM, 05/09/22 Rosamaria Lints, PT, DPT Physical Therapist - [facility]  [phone] (ASCOM)    Lucerito Rosinski C 05/09/2022, 4:42 PM

## 2022-05-09 NOTE — Group Note (Addendum)
LCSW Group Therapy Notes   Date and Time:  05/09/2022 2:42AM  Type of Therapy and Topic: Group Therapy: Worry and Anxiety  Participation Level: [facility] PARTICIPATION LEVEL: Did Not Attend  Description of Group: In this group, patients will be encouraged to explore their worry around what could happen vs what will happen. Each patient will be challenged to think of personal worries and how they will work their way through that worry around what will happen and what could happen. This group will be process-oriented, with patients participating in exploration of their own experiences as well as giving and receiving support and challenge from other group members.  Therapeutic Goals: Patient will identify personal worries that cause anxiety. Patient will identify clues to identify their worry. Patient will identify ways to handle their worry. Patient will discuss ways that their worry has deceased or why it has not decreased.   Summary of Patient Progress: Patient unable to attend group due to disorganization.   Therapeutic Modalities: Cognitive Behavioral Therapy Solution Focused Therapy Motivational Interviewing    Harden Mo, [place] 05/09/2022  3:09 PM

## 2022-05-09 NOTE — Progress Notes (Signed)
One to One Note   7am- Bedroom   8am- Dayroom   9am- Courtyard   10am- Dayroom   11am- Dayroom   12pm- Dayroom   1pm- hallway   2pm- Dayroom   3pm- Outside   4pm- outside   5pm- Bathroom   6pm- Hallway

## 2022-05-09 NOTE — Progress Notes (Signed)
   05/09/22 0745  Psych Admission Type (Psych Patients Only)  Admission Status Voluntary  Psychosocial Assessment  Patient Complaints Agitation;Confusion;Crying spells;Disorientation;Irritability;Restlessness  Eye Contact Brief  Facial Expression Blank  Affect Irritable;Blunted;Anxious  Speech Incoherent;Loud;Slurred  Interaction Intrusive;Demanding  Motor Activity Unsteady;Hyperactive;Pacing;Restless  Appearance/Hygiene In scrubs  Behavior Characteristics Restless;Irritable;Impulsive;Fidgety;Agitated  Mood Anxious;Irritable  Thought Process  Coherency Disorganized;Loose associations  Content Delusions  Delusions Grandeur  Perception UTA  Hallucination None reported or observed  Judgment Poor  Confusion Moderate  Danger to Self  Current suicidal ideation? Denies  Danger to Others  Danger to Others None reported or observed

## 2022-05-09 NOTE — Progress Notes (Signed)
Patient currently sleeping with no sign of distress. Safety precautions maintained.

## 2022-05-09 NOTE — Plan of Care (Signed)
  Problem: Nutrition: Goal: Adequate nutrition will be maintained Outcome: Progressing   Problem: Coping: Goal: Level of anxiety will decrease Outcome: Progressing   Problem: Pain Managment: Goal: General experience of comfort will improve Outcome: Progressing   Problem: Safety: Goal: Ability to remain free from injury will improve Outcome: Progressing   
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
## 2022-05-09 NOTE — Group Note (Signed)
Recreation Therapy Group Note   Group Topic:Other  Group Date: 05/09/2022 Start Time: 0900 End Time: 0945 Facilitators: Rosina Lowenstein, LRT, CTRS Location: Courtyard  Group Description: Outdoor Recreation. Patients had the option to do guided meditation or listen to their favorite songs while outside in the courtyard getting fresh air and sunlight. LRT played music in the background through a speaker. LRT and pts discussed things that they enjoy doing in their free time outside of the hospital.  Goal Area(s) Addressed: Patient will identify leisure interests.  Patient will practice healthy decision making. Patient will engage in recreation activity.  Affect/Mood: Appropriate and Full range   Participation Level: Active and Engaged   Participation Quality: Minimal Cues   Behavior: Appropriate   Speech/Thought Process: Disorganized and Delusional   Insight: Limited   Judgement: Fair    Modes of Intervention: Music   Patient Response to Interventions:  Receptive   Education Outcome:  In group clarification offered    Clinical Observations/Individualized Feedback: [name] was active in their participation of session activities and group discussion. Pt identified "[name]" as one of his favorite singers and asked to listen to it. Pt would get up from the picnic table to dance and sing while listening to music. Pt was hard to understand at times and conversation was disorganized when LRT was able to understand what pt was saying. Pt had 1:1 sitter present with him. Pt interacted appropriate and well with staff and peers.    Plan: Continue to engage patient in RT group sessions 2-3x/week.   Rosina Lowenstein, LRT, CTRS 05/09/2022 11:28 AM
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
## 2022-05-10 DIAGNOSIS — F250 Schizoaffective disorder, bipolar type: Secondary | ICD-10-CM | POA: Diagnosis not present

## 2022-05-10 MED ORDER — NICOTINE 14 MG/24HR TD PT24
14.0000 mg | MEDICATED_PATCH | Freq: Every day | TRANSDERMAL | Status: DC
  Administered 2022-05-10 – 2022-06-05 (×22): 14 mg via TRANSDERMAL
  Filled 2022-05-10 (×26): qty 1

## 2022-05-10 MED ORDER — RISPERIDONE 1 MG PO TABS
2.0000 mg | ORAL_TABLET | ORAL | Status: DC
  Administered 2022-05-10 – 2022-06-03 (×48): 2 mg via ORAL
  Filled 2022-05-10 (×5): qty 2
  Filled 2022-05-10: qty 4
  Filled 2022-05-10 (×3): qty 2
  Filled 2022-05-10 (×2): qty 4
  Filled 2022-05-10 (×4): qty 2
  Filled 2022-05-10: qty 4
  Filled 2022-05-10 (×12): qty 2
  Filled 2022-05-10: qty 4
  Filled 2022-05-10: qty 2
  Filled 2022-05-10: qty 4
  Filled 2022-05-10 (×18): qty 2
  Filled 2022-05-10: qty 4

## 2022-05-10 MED ORDER — CARBAMAZEPINE ER 200 MG PO TB12
400.0000 mg | ORAL_TABLET | Freq: Every day | ORAL | Status: DC
  Administered 2022-05-10 – 2022-06-04 (×26): 400 mg via ORAL
  Filled 2022-05-10 (×26): qty 2

## 2022-05-10 MED ORDER — CHLORPROMAZINE HCL 50 MG PO TABS
50.0000 mg | ORAL_TABLET | Freq: Four times a day (QID) | ORAL | Status: DC | PRN
  Administered 2022-05-24 – 2022-05-26 (×2): 50 mg via ORAL
  Filled 2022-05-10 (×2): qty 1

## 2022-05-10 NOTE — Group Note (Signed)
Date:  05/10/2022 Time:  4:21 PM  Group Topic/Focus:  Self Care:   The focus of this group is to help patients understand the importance of self-care in order to improve or restore emotional, physical, spiritual, interpersonal, and financial health.    Participation Level:  Active  Participation Quality:  Appropriate  Affect:  Appropriate  Cognitive:  Alert  Insight: None  Engagement in Group:  Engaged  Modes of Intervention:  Socialization  Additional Comments:    Liandra Mendia l Ahava Kissoon 05/10/2022, 4:21 PM

## 2022-05-10 NOTE — Group Note (Signed)
Date:  05/10/2022 Time:  8:50 PM  Group Topic/Focus:  Coping With Mental Health Crisis:   The purpose of this group is to help patients identify strategies for coping with mental health crisis.  Group discusses possible causes of crisis and ways to manage them effectively. Overcoming Stress:   The focus of this group is to define stress and help patients assess their triggers.    Participation Level:  Did Not Attend  Participation Quality:    Affect:    Cognitive:    Insight:   Engagement in Group:    Modes of Intervention:    Additional Comments:  Patient unable to attend due to resting in bed.  Luane School 05/10/2022, 8:50 PM

## 2022-05-10 NOTE — Progress Notes (Signed)
One to One Note   7am- Bedroom- sleep   8am- Dayroom   9am- hallway   10am- Dayroom   11am- Bedroom   12pm- Hallway   1pm- Shower   2pm- Bedroom-sleep   3pm- Bedroom- sleep    4pm- Hallway    5pm- Hallway   6pm- Bedroom- asleep
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
## 2022-05-10 NOTE — Group Note (Signed)
Date:  05/10/2022 Time:  11:35 AM  Group Topic/Focus:  Anger Management     Participation Level:  Did not attend  Participation Quality:    Affect:    Cognitive:    Insight:   Engagement in Group:    Modes of Intervention:    Additional Comments:  Did not attend  [name] [name] 05/10/2022, 11:35 AM

## 2022-05-10 NOTE — Plan of Care (Signed)
  Problem: Education: Goal: Knowledge of General Education information will improve Description: Including pain rating scale, medication(s)/side effects and non-pharmacologic comfort measures Outcome: Not Progressing   Problem: Health Behavior/Discharge Planning: Goal: Ability to manage health-related needs will improve Outcome: Not Progressing   Problem: Clinical Measurements: Goal: Ability to maintain clinical measurements within normal limits will improve Outcome: Not Progressing Goal: Will remain free from infection Outcome: Not Progressing Goal: Diagnostic test results will improve Outcome: Not Progressing Goal: Respiratory complications will improve Outcome: Not Progressing Goal: Cardiovascular complication will be avoided Outcome: Not Progressing   Problem: Activity: Goal: Risk for activity intolerance will decrease Outcome: Not Progressing   Problem: Nutrition: Goal: Adequate nutrition will be maintained Outcome: Not Progressing   Problem: Self-Concept: Goal: Ability to identify factors that promote anxiety will improve Outcome: Not Progressing Goal: Level of anxiety will decrease Outcome: Not Progressing Goal: Ability to modify response to factors that promote anxiety will improve Outcome: Not Progressing   Problem: Coping: Goal: Ability to identify and develop effective coping behavior will improve Outcome: Not Progressing   Problem: Education: Goal: Ability to state activities that reduce stress will improve Outcome: Not Progressing   Problem: Skin Integrity: Goal: Risk for impaired skin integrity will decrease Outcome: Not Progressing

## 2022-05-11 DIAGNOSIS — F250 Schizoaffective disorder, bipolar type: Secondary | ICD-10-CM | POA: Diagnosis not present

## 2022-05-11 NOTE — Progress Notes (Signed)
1:1 Nursing note  Patient in bed sleeping. Remains safe at this time.

## 2022-05-11 NOTE — Progress Notes (Signed)
   05/11/22 1500  Psych Admission Type (Psych Patients Only)  Admission Status Voluntary  Psychosocial Assessment  Patient Complaints Confusion;Anxiety  Eye Contact Brief  Facial Expression Blank  Affect Anxious;Blunted  Speech Incoherent;Slurred  Interaction Assertive  Motor Activity Unsteady  Appearance/Hygiene In scrubs  Behavior Characteristics Cooperative;Anxious  Mood Anxious  Thought Process  Coherency Disorganized;Loose associations  Content Delusions  Delusions Grandeur  Perception UTA  Hallucination None reported or observed  Judgment Poor  Confusion Moderate  Danger to Self  Current suicidal ideation? Denies  Danger to Others  Danger to Others None reported or observed

## 2022-05-11 NOTE — Progress Notes (Addendum)
0245-Pt c/o nasal congestion and difficulty breathing. POX 94% on RA. HR 72, RR 18. RR even and unlabored. NP, Dixon notified. Awaiting call back. No s/s of acute resp distress noted. Resting quietly in bed. 1:1 maintained for safety. Plan of care continued.

## 2022-05-11 NOTE — Progress Notes (Signed)
1:1 Nurse Note  Patient ate dinner. Continues to use walker while ambulating. Patient now in bed asleep. 1:1 continued for safety. Patient remains safe.

## 2022-05-11 NOTE — Progress Notes (Signed)
Nursing 1:1 Note  Patient in bed talking with staff. No distress noted. 1:1 continued for safety. Patient remains safe.

## 2022-05-11 NOTE — Progress Notes (Signed)
1:1 Nursing Note  Patient in his room. Stating he wants to leave. Asking to go outside and for someone to take him to [facility]. Also asking for cigarettes. Patient becoming upset, having moments of brief crying spells.

## 2022-05-11 NOTE — Group Note (Signed)
Date:  05/11/2022 Time:  10:25 PM  Group Topic/Focus:  Building Self Esteem:   The Focus of this group is helping patients become aware of the effects of self-esteem on their lives, the things they and others do that enhance or undermine their self-esteem, seeing the relationship between their level of self-esteem and the choices they make and learning ways to enhance self-esteem. Conflict Resolution:   The focus of this group is to discuss the conflict resolution process and how it may be used upon discharge. Developing a Wellness Toolbox:   The focus of this group is to help patients develop a "wellness toolbox" with skills and strategies to promote recovery upon discharge. Goals Group:   The focus of this group is to help patients establish daily goals to achieve during treatment and discuss how the patient can incorporate goal setting into their daily lives to aide in recovery. Healthy Communication:   The focus of this group is to discuss communication, barriers to communication, as well as healthy ways to communicate with others. Making Healthy Choices:   The focus of this group is to help patients identify negative/unhealthy choices they were using prior to admission and identify positive/healthier coping strategies to replace them upon discharge. Overcoming Stress:   The focus of this group is to define stress and help patients assess their triggers. Personal Choices and Values:   The focus of this group is to help patients assess and explore the importance of values in their lives, how their values affect their decisions, how they express their values and what opposes their expression.    Participation Level:  Active  Participation Quality:  Appropriate  Affect:  Appropriate  Cognitive:  Appropriate  Insight: Improving  Engagement in Group:  Engaged  Modes of Intervention:  Discussion and Support  Additional Comments:    [name] 05/11/2022, 10:25 PM

## 2022-05-11 NOTE — Progress Notes (Signed)
Patient in dayroom having breakfast. Patient remains safe.

## 2022-05-11 NOTE — Progress Notes (Signed)
1:1 Nursing note   Patient in the dayroom watching T.V. Calm and cooperative. Continues to use walker to ambulate. 1:1  continued for safety. Patient remains safe.

## 2022-05-11 NOTE — Progress Notes (Signed)
1:1 Nursing Note   Patient in bed resting quietly. No distress noted. 1:1 continued for safety. Patient remains safe.

## 2022-05-11 NOTE — Progress Notes (Signed)
Pt slept throughout the night. No behavior issues noted. 1:1 maintained for safety. No c/o pain/discomfort noted.

## 2022-05-11 NOTE — Progress Notes (Signed)
1:1 Nursing Note  Patient in bed sleeping. 1:1 continued for safety. Patient remains safe.

## 2022-05-11 NOTE — Progress Notes (Signed)
Patient in bed . Responding to internal stimuli. Walker at bedside. Continues 1:1 for safety. Patient remains safe.

## 2022-05-11 NOTE — Progress Notes (Signed)
1:1 Nursing note  Patient in the shower. Monitored during shower by staff. After shower patient attended group. Ambulating with walker. Pleasant and cooperative. 1:1 continued for safety. Patient remains safe.

## 2022-05-11 NOTE — Progress Notes (Signed)
Patient stated that he was feeling agitated and needed ativan. Said he was feeling like hitting something. PRN ativan given.

## 2022-05-11 NOTE — Progress Notes (Signed)
1:1 Nursing note  Patient awake. Using the restroom. Ambulating with walker. Completing morning hygiene. Remains on 1:1 for safety.

## 2022-05-11 NOTE — Progress Notes (Signed)
1:1 Nursing Note  Patient in bed sleeping. Continues 1:1 for safety. Patient remains safe.

## 2022-05-11 NOTE — Plan of Care (Signed)
Alert and orient w/ confusion. Tangential. RIS. Pt is less agitated and irritable at this time. PO med compliant. Tol well. No PRNs given. No behavior issues noted at this time will continue to monitor. 1:1 maintained for safety. Denies SI/HI. No c/o pain/discomfort noted. Plan of care continued.   Problem: Activity: Goal: Risk for activity intolerance will decrease Outcome: Progressing   Problem: Nutrition: Goal: Adequate nutrition will be maintained Outcome: Progressing   Problem: Coping: Goal: Level of anxiety will decrease Outcome: Progressing   Problem: Elimination: Goal: Will not experience complications related to bowel motility Outcome: Progressing   Problem: Pain Managment: Goal: General experience of comfort will improve Outcome: Progressing   Problem: Safety: Goal: Ability to remain free from injury will improve Outcome: Progressing
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
## 2022-05-11 NOTE — Progress Notes (Signed)
1:1 Nursing Note  Patient in bed sleeping. No distress noted. 1:1 continued for safety. Patient remains safe.

## 2022-05-11 NOTE — Group Note (Signed)
LCSW Group Therapy Note   Group Date: 05/11/2022 Start Time: 1305 End Time: 1355   Type of Therapy and Topic:  Group Therapy: Support System  Participation Level:  None   Summary of Patient Progress:  The patient attended group but did not participate.     Marshell Levan, LCSWA 05/11/2022  3:53 PM

## 2022-05-11 NOTE — Group Note (Signed)
Date:  05/11/2022 Time:  6:25 PM  Group Topic/Focus:  Music therapy, Values exploration     Participation Level:  Did Not Attend   [name] 05/11/2022, 6:25 PM

## 2022-05-12 DIAGNOSIS — F250 Schizoaffective disorder, bipolar type: Secondary | ICD-10-CM | POA: Diagnosis not present

## 2022-05-12 NOTE — Progress Notes (Signed)
   05/12/22 0800  Psych Admission Type (Psych Patients Only)  Admission Status Voluntary  Psychosocial Assessment  Patient Complaints Disorientation;Depression;Crying spells  Eye Contact Fair  Facial Expression Blank;Sullen  Affect Preoccupied  Speech Slurred  Interaction Assertive  Motor Activity Unsteady  Appearance/Hygiene In scrubs  Behavior Characteristics Cooperative  Mood Pleasant  Thought Process  Coherency Disorganized  Content Delusions  Delusions Grandeur  Perception WDL  Hallucination None reported or observed  Judgment Impaired  Confusion Moderate  Danger to Self  Current suicidal ideation? Denies  Danger to Others  Danger to Others None reported or observed

## 2022-05-12 NOTE — [facility] Counselor (Signed)
CSW received return phone call from patient's guardian [name].  [name] reports that patient can NOT return to [facility] NOR can patient go to the placement that [name] has identified for the patient.   She also reports that [facility] has NOT found placement for patient at this time and patient would need to remain in the hospital until placement is found. She reports that she works with hospitals "all the time and I know that it isn't what you want to hear but it is what is happening".  CSW pointed out that while patient is not at discharge ready yet, once patient is the expectation is for patient to be discharged to a safe facility/group home.  CSW asked for guardianship paperwork.  [name] stated that she sent it.  CSW stated that she does not have it and can it be resent.  Conversation terminated without incident.   CSW called guardian back after realizing that this writer forgot to ask questions that patient requested be asked of guardian.  Call was not answered and CSW left voicemail.  [name], MSW, LCSW 05/12/2022 4:09 PM

## 2022-05-12 NOTE — Group Note (Signed)
Recreation Therapy Group Note   Group Topic:Emotion Expression  Group Date: 05/12/2022 Start Time: 1405 End Time: 1445 Facilitators: Rosina Lowenstein, LRT, CTRS Location:  Day Room  Group Description: Gratitude Journaling. Patients and LRT discussed what gratitude means, how we can express it and what it means to [place], personally. LRT gave an education handout on the definition of gratitude that also gave different examples of gratitude exercises that they could try. One of the examples was "Gratitude Letter", which prompted you to write a letter to someone you appreciate. LRT played soft music while everyone wrote their letter. Once letter was completed, LRT encouraged people to read their letter, if they wanted to, or share who they wrote it to, at minimum. LRT and pts processed how showing gratitude towards themselves, and others can be applied to everyday life post-discharge.   Goal Area(s) Addressed:  Patient will identify the definition of gratitude. Patient will learn different gratitude exercises. Patient will practice writing/journaling as a coping skill.   Affect/Mood: Appropriate   Participation Level: Active and Engaged   Participation Quality: Independent   Behavior: Calm and Cooperative   Speech/Thought Process: Flight of ideas   Insight: Fair   Judgement: Fair    Modes of Intervention: Activity   Patient Response to Interventions:  Attentive, Engaged, Interested , and Receptive   Education Outcome:  Acknowledges education   Clinical Observations/Individualized Feedback: Rope was active in their participation of session activities and group discussion. Pt identified "[name] and [name]" as someone he was thankful for. Pt chose to read aloud his letter and it did not make much sense, however; when pt was speaking, he was able to clearly say "I'm thankful for [facility] and [name] and the music they made." Pt did write something about his mother  and shared that her name is [place]. Pt interacted well with LRT and peers duration of session.   Plan: Continue to engage patient in RT group sessions 2-3x/week.   Rosina Lowenstein, LRT, CTRS 05/12/2022 3:00 PM

## 2022-05-12 NOTE — Progress Notes (Signed)
Physical Therapy Treatment Patient Details Name: [name] MRN: [id number] DOB: [birth year] Today's Date: 05/12/2022   History of Present Illness [name] is a 62yoM who comes to [facility] on 4/26 from group home with acute AMS, delusions, now admitted to geropsych unit. Staff concerned of UTI or lithium levels being off, both are normal here. Owner of group home describes baseline as 'quiet man of few words.' PMH schiphrenia, HTN. ~3 days post arrival, pt started having garbled speech, shuffling gait, frequent falls/sitting in floor. At baseline pt does not use and DME for mobility.    PT Comments    After evaluation 2 days prior, MD reports plan to modify medication regimen to see if acute motor impairment improved. Today, staff reports pt progressively improving over weekend, now back to AMB ad lib on unit, but using RW as a precaution. Pt demonstrates AMB of household distances with safe and appropriate use of RW, then is able to demonstrate the same distance without device. Author tried to obtain more report from patient on self awareness and perception of instability and falls both now and baseline, however pt perseverates on how staff is refusing to carry him to the store right now. Will continue to follow.    Recommendations for follow up therapy are one component of a multi-disciplinary discharge planning process, led by the attending physician.  Recommendations may be updated based on patient status, additional functional criteria and insurance authorization.  Follow Up Recommendations  Can patient physically be transported by private vehicle: Yes    Assistance Recommended at Discharge Frequent or constant Supervision/Assistance  Patient can return home with the following A little help with bathing/dressing/bathroom;A little help with walking and/or transfers;Assistance with cooking/housework;Assist for transportation   Equipment Recommendations  Rolling walker (2 wheels)     Recommendations for Other Services       Precautions / Restrictions Precautions Precautions: Fall Restrictions Weight Bearing Restrictions: No     Mobility  Bed Mobility                    Transfers                        Ambulation/Gait   Gait Distance (Feet): 240 Feet Assistive device: Rolling walker (2 wheels), None Gait Pattern/deviations: WFL(Within Functional Limits), Drifts right/left       General Gait Details: demonstrates ad lib RW use to end of hallway and back, then again without device.   Stairs             Wheelchair Mobility    Modified Rankin (Stroke Patients Only)       Balance Overall balance assessment: Modified Independent                                          Cognition Arousal/Alertness: Awake/alert Behavior During Therapy: WFL for tasks assessed/performed Overall Cognitive Status: Within Functional Limits for tasks assessed                                          Exercises      General Comments        Pertinent Vitals/Pain Pain Assessment Pain Assessment: No/denies pain    Home Living  Prior Function            PT Goals (current goals can now be found in the care plan section) Acute Rehab PT Goals PT Goal Formulation: Patient unable to participate in goal setting Progress towards PT goals: Progressing toward goals    Frequency    Min 3X/week      PT Plan Current plan remains appropriate;Discharge plan needs to be updated    Co-evaluation              AM-PAC PT "6 Clicks" Mobility   Outcome Measure  Help needed turning from your back to your side while in a flat bed without using bedrails?: A Little Help needed moving from lying on your back to sitting on the side of a flat bed without using bedrails?: A Little Help needed moving to and from a bed to a chair (including a wheelchair)?: A Little Help needed  standing up from a chair using your arms (e.g., wheelchair or bedside chair)?: A Little Help needed to walk in hospital room?: A Little Help needed climbing 3-5 steps with a railing? : A Little 6 Click Score: 18    End of Session   Activity Tolerance: Patient tolerated treatment well;No increased pain Patient left:  (standing at desk) Nurse Communication: Mobility status PT Visit Diagnosis: Unsteadiness on feet (R26.81);Other symptoms and signs involving the nervous system (R29.898)     Time: 1040-1049 PT Time Calculation (min) (ACUTE ONLY): 9 min  Charges:  $Therapeutic Activity: 8-22 mins                    11:17 AM, 05/12/22 Rosamaria Lints, PT, DPT Physical Therapist - [facility]  [phone] (ASCOM)   Degan Hanser C 05/12/2022, 11:14 AM

## 2022-05-12 NOTE — Progress Notes (Signed)
D: Pt alert and disoriented to situation. Pt denies experiencing any anxiety/depression at time of assessment. Pt denies experiencing any pain at this time. Pt denies experiencing any SI/HI, or AVH at this time.   A: Scheduled medications administered as ordered. Prn Ativan 2mg  po offered for anxiety. Support and encouragement provided. Patient remains on 1:1 level of observation for falls, routine safety checks conducted q15 minutes with assigned staff present at all time. R: No adverse drug reactions noted. Prn noted as effective during reassessment. Pt complaint with medications. Pt interacts minimally with others on the unit. Pt remains safe at this time. Will continue to monitor.

## 2022-05-12 NOTE — Group Note (Signed)
LCSW Group Therapy Note  Group Date: 05/12/2022 Start Time: 1325 End Time: 1400   Type of Therapy and Topic:  Group Therapy - How To Cope with Nervousness about Discharge   Participation Level:  Active   Description of Group This process group involved identification of patients' feelings about discharge. Some of them are scheduled to be discharged soon, while others are new admissions, but each of them was asked to share thoughts and feelings surrounding discharge from the hospital. One common theme was that they are excited at the prospect of going home, while another was that many of them are apprehensive about sharing why they were hospitalized. Patients were given the opportunity to discuss these feelings with their peers in preparation for discharge.  Therapeutic Goals  Patient will identify their overall feelings about pending discharge. Patient will think about how they might proactively address issues that they believe will once again arise once they get home (i.e. with parents). Patients will participate in discussion about having hope for change.   Summary of Patient Progress:  Patient was very active throughout the session. Patient demonstrated poor insight into the subject matter, and proved open to input from peers and feedback from CSW. Patient was preoccupied with discussions on returning to live independently and staying in an apartment.  He also reports that he wants to return to services with [facility]  CSW reminded that patient's guardian would need to be involved in theses discussions, however, patient was hyperfocused on this and unable to be redirected long-term.    Therapeutic Modalities Cognitive Behavioral Therapy   Claudie Fisherman 05/12/2022  2:27 PM
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
## 2022-05-12 NOTE — BH IP Treatment Plan (Signed)
Interdisciplinary Treatment and Diagnostic Plan Update  05/12/2022 Time of Session: 9:30AM [name] MRN: [id number]  Principal Diagnosis: Schizoaffective disorder, bipolar type [facility])  Secondary Diagnoses: Principal Problem:   Schizoaffective disorder, bipolar type (HCC)   Current Medications:  Current Facility-Administered Medications  Medication Dose Route Frequency Provider Last Rate Last Admin   acetaminophen (TYLENOL) tablet 650 mg  650 mg Oral Q6H PRN Bennett, Christal H, NP   650 mg at 05/07/22 0832   alum & mag hydroxide-simeth (MAALOX/MYLANTA) 200-200-20 MG/5ML suspension 30 mL  30 mL Oral Q4H PRN Bennett, Christal H, NP       amantadine (SYMMETREL) capsule 100 mg  100 mg Oral BID Bennett, Christal H, NP   100 mg at 05/12/22 0832   amLODipine (NORVASC) tablet 5 mg  5 mg Oral Daily Bennett, Christal H, NP   5 mg at 05/12/22 0832   atorvastatin (LIPITOR) tablet 40 mg  40 mg Oral QHS Bennett, Christal H, NP   40 mg at 05/11/22 2212   carbamazepine (TEGRETOL XR) 12 hr tablet 400 mg  400 mg Oral QHS Sarina Ill, DO   400 mg at 05/11/22 2212   chlorproMAZINE (THORAZINE) tablet 50 mg  50 mg Oral Q6H PRN Sarina Ill, DO       fluticasone furoate-vilanterol (BREO ELLIPTA) 200-25 MCG/ACT 1 puff  1 puff Inhalation Daily Bennett, Christal H, NP   1 puff at 05/12/22 0837   LORazepam (ATIVAN) tablet 2 mg  2 mg Oral TID PRN Thurston Hole H, NP   2 mg at 05/11/22 2326   Or   LORazepam (ATIVAN) injection 2 mg  2 mg Intramuscular TID PRN Willeen Cass, Christal H, NP   2 mg at 05/09/22 1031   losartan (COZAAR) tablet 100 mg  100 mg Oral Daily Sarina Ill, DO   100 mg at 05/12/22 4098   magnesium hydroxide (MILK OF MAGNESIA) suspension 30 mL  30 mL Oral Daily PRN Bennett, Christal H, NP       nicotine (NICODERM CQ - dosed in mg/24 hours) patch 14 mg  14 mg Transdermal Daily Sarina Ill, DO   14 mg at 05/12/22 0845   risperiDONE (RISPERDAL) tablet  2 mg  2 mg Oral [id number] Sarina Ill, DO   2 mg at 05/12/22 1191   PTA Medications: Medications Prior to Admission  Medication Sig Dispense Refill Last Dose   amantadine (SYMMETREL) 100 MG capsule Take 100 mg by mouth 2 (two) times daily.   05/06/2022   atorvastatin (LIPITOR) 40 MG tablet Take 1 tablet (40 mg total) by mouth at bedtime. 30 tablet 1 05/06/2022   lithium carbonate (LITHOBID) 300 MG CR tablet Take 1 tablet (300 mg total) by mouth 3 (three) times daily. (Patient taking differently: Take 600 mg by mouth at bedtime.) 90 tablet 1 05/06/2022   amLODipine (NORVASC) 5 MG tablet Take 1 tablet (5 mg total) by mouth daily. 30 tablet 1 Unknown   fluticasone furoate-vilanterol (BREO ELLIPTA) 200-25 MCG/ACT AEPB Inhale 1 puff into the lungs daily. 1 each 1 Unknown   losartan (COZAAR) 50 MG tablet Take 1 tablet (50 mg total) by mouth daily. 30 tablet 1 Unknown   OLANZapine (ZYPREXA) 10 MG tablet Take 1 tablet (10 mg total) by mouth daily. (Patient not taking: Reported on 05/02/2022) 30 tablet 1 Unknown   OLANZapine-Samidorphan (LYBALVI) 10-10 MG TABS Take 1 tablet by mouth daily.   Unknown   QUEtiapine (SEROQUEL) 200 MG tablet Take 1 tablet (  200 mg total) by mouth at bedtime. (Patient not taking: Reported on 05/02/2022) 30 tablet 1 Unknown   risperiDONE (RISPERDAL M-TABS) 1 MG disintegrating tablet Take 1 tablet (1 mg total) by mouth 2 (two) times daily as needed (agitation). 60 tablet 1 Unknown    Patient Stressors: Marital or family conflict   Traumatic event    Patient Strengths: Ability for insight  Motivation for treatment/growth   Treatment Modalities: Medication Management, Group therapy, Case management,  1 to 1 session with clinician, Psychoeducation, Recreational therapy.   Physician Treatment Plan for Primary Diagnosis: Schizoaffective disorder, bipolar type (HCC) Long Term Goal(s): Improvement in symptoms so as ready for discharge   Short Term Goals: Ability to  identify changes in lifestyle to reduce recurrence of condition will improve Ability to verbalize feelings will improve Ability to disclose and discuss suicidal ideas Ability to demonstrate self-control will improve Ability to identify and develop effective coping behaviors will improve Ability to maintain clinical measurements within normal limits will improve Compliance with prescribed medications will improve Ability to identify triggers associated with substance abuse/mental health issues will improve  Medication Management: Evaluate patient's response, side effects, and tolerance of medication regimen.  Therapeutic Interventions: 1 to 1 sessions, Unit Group sessions and Medication administration.  Evaluation of Outcomes: Progressing  Physician Treatment Plan for Secondary Diagnosis: Principal Problem:   Schizoaffective disorder, bipolar type (HCC)  Long Term Goal(s): Improvement in symptoms so as ready for discharge   Short Term Goals: Ability to identify changes in lifestyle to reduce recurrence of condition will improve Ability to verbalize feelings will improve Ability to disclose and discuss suicidal ideas Ability to demonstrate self-control will improve Ability to identify and develop effective coping behaviors will improve Ability to maintain clinical measurements within normal limits will improve Compliance with prescribed medications will improve Ability to identify triggers associated with substance abuse/mental health issues will improve     Medication Management: Evaluate patient's response, side effects, and tolerance of medication regimen.  Therapeutic Interventions: 1 to 1 sessions, Unit Group sessions and Medication administration.  Evaluation of Outcomes: Progressing   RN Treatment Plan for Primary Diagnosis: Schizoaffective disorder, bipolar type (HCC) Long Term Goal(s): Knowledge of disease and therapeutic regimen to maintain health will improve  Short  Term Goals: Ability to demonstrate self-control, Ability to participate in decision making will improve, Ability to verbalize feelings will improve, Ability to disclose and discuss suicidal ideas, Ability to identify and develop effective coping behaviors will improve, and Compliance with prescribed medications will improve  Medication Management: RN will administer medications as ordered by provider, will assess and evaluate patient's response and provide education to patient for prescribed medication. RN will report any adverse and/or side effects to prescribing provider.  Therapeutic Interventions: 1 on 1 counseling sessions, Psychoeducation, Medication administration, Evaluate responses to treatment, Monitor vital signs and CBGs as ordered, Perform/monitor CIWA, COWS, AIMS and Fall Risk screenings as ordered, Perform wound care treatments as ordered.  Evaluation of Outcomes: Progressing   LCSW Treatment Plan for Primary Diagnosis: Schizoaffective disorder, bipolar type (HCC) Long Term Goal(s): Safe transition to appropriate next level of care at discharge, Engage patient in therapeutic group addressing interpersonal concerns.  Short Term Goals: Engage patient in aftercare planning with referrals and resources, Increase social support, Increase ability to appropriately verbalize feelings, Increase emotional regulation, Facilitate acceptance of mental health diagnosis and concerns, and Increase skills for wellness and recovery  Therapeutic Interventions: Assess for all discharge needs, 1 to 1 time with Child psychotherapist,  Explore available resources and support systems, Assess for adequacy in community support network, Educate family and significant other(s) on suicide prevention, Complete Psychosocial Assessment, Interpersonal group therapy.  Evaluation of Outcomes: Progressing   Progress in Treatment: Attending groups: No. Participating in groups: No. Taking medication as prescribed:  Yes. Toleration medication: Yes. Family/Significant other contact made: Yes, individual(s) contacted:  SPE completed with the patient's guardian.  Patient understands diagnosis: No. Discussing patient identified problems/goals with staff: No. Medical problems stabilized or resolved: Yes. Denies suicidal/homicidal ideation: Yes. Issues/concerns per patient self-inventory: No. Other: none  New problem(s) identified: No, Describe:  none Update 05/12/2022:  No changes at this time.    New Short Term/Long Term Goal(s):  elimination of symptoms of psychosis, medication management for mood stabilization; elimination of SI thoughts; development of comprehensive mental wellness/sobriety plan. Update 05/12/2022:  No changes at this time.    Patient Goals:  Patient unable to participate in treatment team.  Patient was asleep following administration of medication during agitation protocol. Update 05/12/2022:  Patient remains safe on the unit at this time.  Patient continues to clear up as time progresses.  Patient, however, is still not at baseline.  Patient able to return to his group home, however, will have to leave as he has been served a notice.     Discharge Plan or Barriers: CSW to assist with discharge plans.  Update 05/12/2022:  No changes at this time.    Reason for Continuation of Hospitalization: Anxiety Delusions  Depression Hallucinations Medical Issues Medication stabilization   Estimated Length of Stay: 1-7 days Update 05/12/2022: TBD  Last 3 [place] Suicide Severity Risk Score: Flowsheet Row Admission (Current) from 05/06/2022 in [facility] BE[facility] ED from 05/02/2022 in [facility] Emergency Department at [facility] Admission (Discharged) from 05/02/2021 in [facility] INPATIENT BEHAVIORAL MEDICINE  C-SSRS RISK CATEGORY No Risk No Risk No Risk       Last PHQ 2/9 Scores:     No data to display          Scribe for Treatment Team: Harden Mo,  Alexander Mt 05/12/2022 9:31 AM

## 2022-05-12 NOTE — Plan of Care (Signed)
  Problem: Education: Goal: Knowledge of General Education information will improve Description: Including pain rating scale, medication(s)/side effects and non-pharmacologic comfort measures Outcome: Not Progressing   Problem: Health Behavior/Discharge Planning: Goal: Ability to manage health-related needs will improve Outcome: Not Progressing   Problem: Clinical Measurements: Goal: Ability to maintain clinical measurements within normal limits will improve Outcome: Not Progressing Goal: Will remain free from infection Outcome: Not Progressing Goal: Diagnostic test results will improve Outcome: Not Progressing Goal: Respiratory complications will improve Outcome: Not Progressing Goal: Cardiovascular complication will be avoided Outcome: Not Progressing   Problem: Activity: Goal: Risk for activity intolerance will decrease Outcome: Not Progressing   Problem: Nutrition: Goal: Adequate nutrition will be maintained Outcome: Not Progressing   Problem: Coping: Goal: Level of anxiety will decrease Outcome: Not Progressing   Problem: Self-Concept: Goal: Ability to identify factors that promote anxiety will improve Outcome: Not Progressing Goal: Level of anxiety will decrease Outcome: Not Progressing Goal: Ability to modify response to factors that promote anxiety will improve Outcome: Not Progressing

## 2022-05-13 DIAGNOSIS — F250 Schizoaffective disorder, bipolar type: Secondary | ICD-10-CM | POA: Diagnosis not present

## 2022-05-13 NOTE — [place] FL2 (Signed)
[place] MEDICAID FL2 LEVEL OF CARE FORM     IDENTIFICATION  Patient Name: [name] Birthdate: [birth year] Sex: male Admission Date (Current Location): 05/06/2022  [place] and [place] Number:  [name] [id number] [facility] Facility and Address:  [facility], [address] [place], [place] [address]      Provider Number: [id number]  Attending Physician Name and Address:  Sarina Ill,*  Relative Name and Phone Number:  Legal Guardian, [employer], Wilhemina Cash [phone]    Current Level of Care: Hospital Recommended Level of Care: Assisted Living Facility Prior Approval Number:    Date Approved/Denied:   PASRR Number:    Discharge Plan: Other (Comment) (Assisted Living)    Current Diagnoses: Patient Active Problem List   Diagnosis Date Noted   Schizoaffective disorder, bipolar type (HCC) 06/15/2016   Hypertension 06/13/2016   Tobacco use disorder 10/04/2015   Noncompliance 10/03/2015    Orientation RESPIRATION BLADDER Height & Weight     Self, Time, Situation, Place  Normal Continent Weight: 147 lb (66.7 kg) Height:  5\' 10"  (177.8 cm)  BEHAVIORAL SYMPTOMS/MOOD NEUROLOGICAL BOWEL NUTRITION STATUS   (NA)  (NA) Continent Diet (Disphasia 3, all meats chopped and moistened)  AMBULATORY STATUS COMMUNICATION OF NEEDS Skin   Independent Verbally Normal                       Personal Care Assistance Level of Assistance  Bathing Bathing Assistance: Limited assistance         Functional Limitations Info   (NA)          SPECIAL CARE FACTORS FREQUENCY                       Contractures Contractures Info: Not present    Additional Factors Info  Code Status, Allergies, Psychotropic Code Status Info: Full Allergies Info: Haldol Psychotropic Info: carbamazepine (TEGRETOL XR) 12 hr tablet 400 mg, risperiDONE (RISPERDAL) tablet 2 mg         Current Medications (05/13/2022):  This is the current hospital active  medication list Current Facility-Administered Medications  Medication Dose Route Frequency Provider Last Rate Last Admin   acetaminophen (TYLENOL) tablet 650 mg  650 mg Oral Q6H PRN Bennett, Christal H, NP   650 mg at 05/07/22 0832   alum & mag hydroxide-simeth (MAALOX/MYLANTA) 200-200-20 MG/5ML suspension 30 mL  30 mL Oral Q4H PRN Bennett, Christal H, NP       amantadine (SYMMETREL) capsule 100 mg  100 mg Oral BID Bennett, Christal H, NP   100 mg at 05/13/22 0839   amLODipine (NORVASC) tablet 5 mg  5 mg Oral Daily Bennett, Christal H, NP   5 mg at 05/13/22 0839   atorvastatin (LIPITOR) tablet 40 mg  40 mg Oral QHS Bennett, Christal H, NP   40 mg at 05/12/22 2106   carbamazepine (TEGRETOL XR) 12 hr tablet 400 mg  400 mg Oral QHS Sarina Ill, DO   400 mg at 05/12/22 2108   chlorproMAZINE (THORAZINE) tablet 50 mg  50 mg Oral Q6H PRN Sarina Ill, DO       fluticasone furoate-vilanterol (BREO ELLIPTA) 200-25 MCG/ACT 1 puff  1 puff Inhalation Daily Bennett, Christal H, NP   1 puff at 05/13/22 0839   LORazepam (ATIVAN) tablet 2 mg  2 mg Oral TID PRN Thurston Hole H, NP   2 mg at 05/11/22 2326   Or   LORazepam (ATIVAN) injection 2 mg  2 mg Intramuscular TID PRN Willeen Cass, Christal H, NP   2 mg at 05/09/22 1031   losartan (COZAAR) tablet 100 mg  100 mg Oral Daily Sarina Ill, DO   100 mg at 05/13/22 1610   magnesium hydroxide (MILK OF MAGNESIA) suspension 30 mL  30 mL Oral Daily PRN Bennett, Christal H, NP       nicotine (NICODERM CQ - dosed in mg/24 hours) patch 14 mg  14 mg Transdermal Daily Sarina Ill, DO   14 mg at 05/13/22 0839   risperiDONE (RISPERDAL) tablet 2 mg  2 mg Oral [id number] Sarina Ill, DO   2 mg at 05/13/22 9604     Discharge Medications: Please see discharge summary for a list of discharge medications.  Relevant Imaging Results:  Relevant Lab Results:   Additional Information Social Security number  [id number]  Harden Mo, [place]

## 2022-05-13 NOTE — BHH Counselor (Signed)
CSW has referred patient to the following:  Destination   Service Provider Request Status Address Phone Fax  [facility]  Pending - Request Sent [address] Lacretia Nicks [place] [address] [phone]  [facility]  Pending - Request Sent [address] Sharl Ma [place] [address] [phone]  [facility]  Pending - Request Sent [address], [place] [address] [phone]  [facility]  Pending - Request Sent [address], [place] [address] [phone]  [facility]  Pending - Request Sent [address] [place] [address] [phone]  [facility]  Pending - Request Sent [address] [place] [address] [phone]  [facility]  Pending - Request Sent [address], [place] [address] [phone]  [facility]  Pending - Request Sent [address] [place], [place] [address] [phone]  [facility]  Pending - Request Sent [address], [place] [address] [phone]  [facility]  Pending - Request Sent [address], [place] [address] [phone]  [facility]  Pending - Request Sent [address], [place] [address] [phone]  [facility]  Pending - Request Sent [address] [place] [address] [phone]  [facility]  Pending - Request Sent [address], [place] [address] [phone]  [facility] [place] [facility]  Pending - Request Sent [address] [place] [address] [phone]  [facility]  Pending - Request Sent [address] [place] [address]  [phone]  [facility]  Pending - Request Sent [address] [place] [address] [phone]  [facility]  Pending - Request Sent [address], [place] [address] [phone]  [facility]  Pending - Request Sent [address] [place] [address] [phone]  [facility]  Pending - Request Sent [phone] Nedra Hai?Curley Spice [place] [address] [phone]  [facility]  Pending - Request Sent [address] [place], [place] [address] [phone]  [facility]  Pending - Request Sent [address] [place] [address] [phone]  [facility] [place] [facility]  Pending - Request Sent [address] [place] [address] [phone]  [facility]  Pending - Request Sent [address], [place] [address] [phone]  [facility]  Pending - Request Sent [address] [place] [address] [phone]  [facility]  Pending - Request Sent [address], [place] [address] [phone]  [facility]  Pending - Request Sent [address] [place], [place] [address] [phone]  [facility]  Pending - Request Sent [address], [place] [address] [phone]  [facility]  Pending - Request Sent [address] [place], [place] [address] [phone]  [facility]  Pending - Request Sent [address] [place] [address] [phone]  [facility]  Pending - Request Sent [address], [place] [address] [phone]  [facility]  Pending - Request Sent  [address]  [place] [address] [phone]  [facility]  Pending - Request Sent [address] [place], [place] [address] [phone]  [facility]  Pending - Request Sent [address] [place] [address] [phone]  [facility]  Pending - Request Sent [address], [place] [address] [phone]  [facility]  Pending - Request Sent [address] [place] [address] [phone]  [facility]  Pending - Request Sent [address] [place], [place] [address] [phone]  [facility]  Pending - Request Sent [address], [place] [address] [phone]  [facility]  Pending - Request Sent [address] [place] [address] [phone]  [facility]  Pending - Request Sent [address] [place] [address] [phone]  [facility]  Pending - Request Sent [phone] [address], [place] [address] [phone]  [facility]  Pending - Request Sent [phone] [address] [place] [address] [phone]  [facility]  Pending - Request Sent [address], [place] [address] [phone]  [facility]  Pending - Request Sent [phone] [address] [place], [place] [address] [phone]  [facility]/ALF  Pending - Request Sent [address] [place] [address] [phone]  [facility]ALF  Pending - Request Sent [address] [place] [address] [phone]  [facility]ALF  Pending - Request Sent [address] [place] [address] [phone]  [facility]  RETIREMENT COMMUNITY SNF/ALF  Pending - Request Sent [address] [place] [address] [phone]   [name], MSW, LCSW 05/13/2022 4:21 PM

## 2022-05-13 NOTE — Progress Notes (Signed)
Patient denies SI, HI, and AVH. He denies pain and other physical problems. Patient alert and oriented x4. He is calm and cooperative and interacts appropriately with staff. Patient is worried about where he will go upon discharge and is preoccupied with staff calling his guardian. Patient remains safe on the unit at this time.

## 2022-05-13 NOTE — Group Note (Signed)
Recreation Therapy Group Note   Group Topic:Health and Wellness  Group Date: 05/13/2022 Start Time: 1400 End Time: 1445 Facilitators: Rosina Lowenstein, LRT, CTRS Location: Courtyard  Group Description: Feelings Towards Discharge. Patients, LRT and NT all sat outside while listening to music and getting fresh air and sunlight. The group discussed how they feel about going home, their stay at the hospital, things they have learned, and the first thing they will do when they get home.  Goal Area(s) Addressed: Patient will acknowledge the benefits of sunlight and fresh air to mental health.  Patient will reflect on previous life events surrounding their hospital stay. Patients will communicate with peers and LRT.   Affect/Mood: Appropriate   Participation Level: Active and Engaged   Participation Quality: Independent   Behavior: Calm and Cooperative   Speech/Thought Process: Disorganized   Insight: Fair   Judgement: Fair    Modes of Intervention: Guided Discussion   Patient Response to Interventions:  Receptive   Education Outcome:  Acknowledges education   Clinical Observations/Individualized Feedback: Selwyn was mostly active in their participation of session activities and group discussion. Pt asked if he was able to smoke a cigarette as soon as we got outside. LRT politely told pt "no" and pt was receptive. Pt was asking LRT and NT to speak with the doctor saying that he doesn't want to be in the hospital and doesn't need to be here. Pt was talkative while outside and shared that he needed to go in due to a " bowel movement".    Plan: Continue to engage patient in RT group sessions 2-3x/week.   Rosina Lowenstein, LRT, CTRS 05/13/2022 2:57 PM

## 2022-05-13 NOTE — Group Note (Signed)
[facility] LCSW Group Therapy Note   Group Date: 05/13/2022 Start Time: 1320 End Time: 1400   Type of Therapy/Topic:  Group Therapy:  Balance in Life  Participation Level:  Minimal   Description of Group:    This group will address the concept of balance and how it feels and looks when one is unbalanced. Patients will be encouraged to process areas in their lives that are out of balance, and identify reasons for remaining unbalanced. Facilitators will guide patients utilizing problem- solving interventions to address and correct the stressor making their life unbalanced. Understanding and applying boundaries will be explored and addressed for obtaining  and maintaining a balanced life. Patients will be encouraged to explore ways to assertively make their unbalanced needs known to significant others in their lives, using other group members and facilitator for support and feedback.  Therapeutic Goals: Patient will identify two or more emotions or situations they have that consume much of in their lives. Patient will identify signs/triggers that life has become out of balance:  Patient will identify two ways to set boundaries in order to achieve balance in their lives:  Patient will demonstrate ability to communicate their needs through discussion and/or role plays  Summary of Patient Progress: Patient present in group.  Patient was preoccupied with discussing his guardian.  Patient is upset that guardian is not allowing him to return to a previous living situation.  Patient was focused on discussing how a "white lady can tell me, a black man, what to do".  CSW attempted to redirect and reframe patient.  Patient did not change discussion and engage but chose to sit quietly for the remainder.   Therapeutic Modalities:   Cognitive Behavioral Therapy Solution-Focused Therapy Assertiveness Training   Harden Mo, LCSW

## 2022-05-13 NOTE — Progress Notes (Signed)
1:1 nursing note  710: Patient is sitting in the dayroom with sitter at side. He is watching TV and is calm.  830: Patient sleeping in bed with sitter at bedside.  930: Patient sleeping in bed with sitter at bedside.  1035: Patient standing in the hallway near the nurses station, talking with sitter.  Patient's 1:1 order was discontinued at 10:41am.

## 2022-05-13 NOTE — Group Note (Signed)
Date:  05/13/2022 Time:  12:56 PM  Group Topic/Focus:  Therapeutic Movie     Participation Level:  Active  Participation Quality:  Appropriate  Affect:  Appropriate  Cognitive:  Alert and Appropriate  Insight: Appropriate  Engagement in Group:  Engaged  Modes of Intervention:  Education  Additional Comments:    Marta Antu 05/13/2022, 12:56 PM
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
## 2022-05-14 DIAGNOSIS — F250 Schizoaffective disorder, bipolar type: Secondary | ICD-10-CM | POA: Diagnosis not present

## 2022-05-14 LAB — CARBAMAZEPINE LEVEL, TOTAL: Carbamazepine Lvl: 10.9 ug/mL (ref 4.0–12.0)

## 2022-05-14 LAB — COMPREHENSIVE METABOLIC PANEL
ALT: 15 U/L (ref 0–44)
AST: 25 U/L (ref 15–41)
Albumin: 3.8 g/dL (ref 3.5–5.0)
Alkaline Phosphatase: 85 U/L (ref 38–126)
Anion gap: 7 (ref 5–15)
BUN: 14 mg/dL (ref 6–20)
CO2: 22 mmol/L (ref 22–32)
Calcium: 8.8 mg/dL — ABNORMAL LOW (ref 8.9–10.3)
Chloride: 111 mmol/L (ref 98–111)
Creatinine, Ser: 0.89 mg/dL (ref 0.61–1.24)
GFR, Estimated: >60 mL/min (ref >60)
Glucose, Bld: 101 mg/dL — ABNORMAL HIGH (ref 70–99)
Potassium: 3.7 mmol/L (ref 3.5–5.1)
Sodium: 140 mmol/L (ref 135–145)
Total Bilirubin: 0.7 mg/dL (ref 0.3–1.2)
Total Protein: 7 g/dL (ref 6.5–8.1)

## 2022-05-14 LAB — LIPID PANEL
Cholesterol: 119 mg/dL (ref 0–200)
HDL: 51 mg/dL
LDL Cholesterol: 55 mg/dL (ref 0–99)
Total CHOL/HDL Ratio: 2.3 ratio
Triglycerides: 64 mg/dL
VLDL: 13 mg/dL (ref 0–40)

## 2022-05-14 LAB — HEMOGLOBIN A1C
Hgb A1c MFr Bld: 4.8 % (ref 4.8–5.6)
Mean Plasma Glucose: 91.06 mg/dL

## 2022-05-14 LAB — CBC WITH DIFFERENTIAL/PLATELET
Abs Immature Granulocytes: 0.06 10*3/uL (ref 0.00–0.07)
Basophils Absolute: 0 10*3/uL (ref 0.0–0.1)
Basophils Relative: 0 %
Eosinophils Absolute: 0.2 10*3/uL (ref 0.0–0.5)
Eosinophils Relative: 3 %
HCT: 39.1 % (ref 39.0–52.0)
Hemoglobin: 12.1 g/dL — ABNORMAL LOW (ref 13.0–17.0)
Immature Granulocytes: 1 %
Lymphocytes Relative: 21 %
Lymphs Abs: 1.5 10*3/uL (ref 0.7–4.0)
MCH: 32.1 pg (ref 26.0–34.0)
MCHC: 30.9 g/dL (ref 30.0–36.0)
MCV: 103.7 fL — ABNORMAL HIGH (ref 80.0–100.0)
Monocytes Absolute: 0.7 10*3/uL (ref 0.1–1.0)
Monocytes Relative: 10 %
Neutro Abs: 4.7 10*3/uL (ref 1.7–7.7)
Neutrophils Relative %: 65 %
Platelets: 290 10*3/uL (ref 150–400)
RBC: 3.77 MIL/uL — ABNORMAL LOW (ref 4.22–5.81)
RDW: 12.4 % (ref 11.5–15.5)
WBC: 7.2 10*3/uL (ref 4.0–10.5)
nRBC: 0 % (ref 0.0–0.2)

## 2022-05-14 NOTE — Progress Notes (Signed)
   05/14/22 0900  Psych Admission Type (Psych Patients Only)  Admission Status Voluntary  Psychosocial Assessment  Patient Complaints None  Eye Contact Fair  Facial Expression Sullen  Affect Appropriate to circumstance  Speech Tangential  Interaction Needy  Motor Activity Slow;Unsteady  Appearance/Hygiene Unremarkable  Behavior Characteristics Cooperative  Mood Anxious  Thought Chartered certified accountant of ideas;Tangential  Content WDL  Delusions None reported or observed  Perception UTA  Hallucination None reported or observed  Judgment Limited  Confusion Moderate  Danger to Self  Current suicidal ideation? Denies  Danger to Others  Danger to Others None reported or observed
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
## 2022-05-14 NOTE — Group Note (Signed)
Recreation Therapy Group Note   Group Topic:Leisure Education  Group Date: 05/14/2022 Start Time: 1400 End Time: 1450 Facilitators: Rosina Lowenstein, LRT, CTRS Location: Courtyard  Group Description: Music. Patients encouraged to name their favorite song(s) for LRT to play song through speaker for group to hear. Patient educated on the definition of leisure and the importance of having different leisure interests outside of the hospital. Group discussed how leisure activities can often be used as Pharmacologist and that listening to music is one example.   Goal Area(s) Addressed:  Patient will identify a current leisure interest.  Patient will practice making a positive decision. Patient will have the opportunity to try a new leisure activity.  Affect/Mood: Appropriate   Participation Level: Active and Engaged   Participation Quality: Independent   Behavior: Appropriate, Calm, and Cooperative   Speech/Thought Process: Coherent   Insight: Good   Judgement: Good   Modes of Intervention: Music   Patient Response to Interventions:  Attentive, Engaged, Interested , and Receptive   Education Outcome:  Acknowledges education   Clinical Observations/Individualized Feedback: [name] was active in their participation of session activities and group discussion. Pt identified "Lighthouse by Gershon Cull" as one of his favorite songs to listen to. Pt interacted well with LRT and peers duration of session.    Plan: Continue to engage patient in RT group sessions 2-3x/week.   Rosina Lowenstein, LRT, CTRS 05/14/2022 3:08 PM

## 2022-05-14 NOTE — NC FL2 (Signed)
[place] MEDICAID FL2 LEVEL OF CARE FORM     IDENTIFICATION  Patient Name: [name] Birthdate: [birth year] Sex: male Admission Date (Current Location): 05/06/2022  [place] and [place] Number:  [name] [id number] [facility] Facility and Address:  [name], [address], [place], [place] [address]      Provider Number: [id number]  Attending Physician Name and Address:  Sarina Ill,*  Relative Name and Phone Number:  Legal Guardian, [employer], Wilhemina Cash [phone]    Current Level of Care: Hospital Recommended Level of Care: Other (Comment), Family Care Home (Group Home, Family Care Home, Adult Care Home) Prior Approval Number:    Date Approved/Denied:   PASRR Number:    Discharge Plan: Other (Comment) (Family Care Home, Group Home, Adult Care Home)    Current Diagnoses: Patient Active Problem List   Diagnosis Date Noted   Schizoaffective disorder, bipolar type (HCC) 06/15/2016   Hypertension 06/13/2016   Tobacco use disorder 10/04/2015   Noncompliance 10/03/2015    Orientation RESPIRATION BLADDER Height & Weight     Self, Time, Situation, Place  Normal Continent Weight: 147 lb (66.7 kg) Height:  5\' 10"  (177.8 cm)  BEHAVIORAL SYMPTOMS/MOOD NEUROLOGICAL BOWEL NUTRITION STATUS   (NA)  (NA) Continent Diet (Disphasia 3, all meats chopped and moistened)  AMBULATORY STATUS COMMUNICATION OF NEEDS Skin   Independent Verbally Normal                       Personal Care Assistance Level of Assistance  Bathing Bathing Assistance: Limited assistance         Functional Limitations Info   (NA)          SPECIAL CARE FACTORS FREQUENCY                       Contractures Contractures Info: Not present    Additional Factors Info  Code Status, Allergies, Psychotropic Code Status Info: Full Allergies Info: Haldol Psychotropic Info: carbamazepine (TEGRETOL XR) 12 hr tablet 400 mg, risperiDONE (RISPERDAL) tablet  2 mg         Current Medications (05/14/2022):  This is the current hospital active medication list Current Facility-Administered Medications  Medication Dose Route Frequency Provider Last Rate Last Admin   acetaminophen (TYLENOL) tablet 650 mg  650 mg Oral Q6H PRN Bennett, Christal H, NP   650 mg at 05/07/22 0832   alum & mag hydroxide-simeth (MAALOX/MYLANTA) 200-200-20 MG/5ML suspension 30 mL  30 mL Oral Q4H PRN Bennett, Christal H, NP       amantadine (SYMMETREL) capsule 100 mg  100 mg Oral BID Bennett, Christal H, NP   100 mg at 05/14/22 0854   amLODipine (NORVASC) tablet 5 mg  5 mg Oral Daily Bennett, Christal H, NP   5 mg at 05/14/22 0854   atorvastatin (LIPITOR) tablet 40 mg  40 mg Oral QHS Bennett, Christal H, NP   40 mg at 05/13/22 2122   carbamazepine (TEGRETOL XR) 12 hr tablet 400 mg  400 mg Oral QHS Sarina Ill, DO   400 mg at 05/13/22 2124   chlorproMAZINE (THORAZINE) tablet 50 mg  50 mg Oral Q6H PRN Sarina Ill, DO       fluticasone furoate-vilanterol (BREO ELLIPTA) 200-25 MCG/ACT 1 puff  1 puff Inhalation Daily Bennett, Christal H, NP   1 puff at 05/14/22 0854   LORazepam (ATIVAN) tablet 2 mg  2 mg Oral TID PRN Hampton Abbot, NP  2 mg at 05/11/22 2326   Or   LORazepam (ATIVAN) injection 2 mg  2 mg Intramuscular TID PRN Willeen Cass, Christal H, NP   2 mg at 05/09/22 1031   losartan (COZAAR) tablet 100 mg  100 mg Oral Daily Sarina Ill, DO   100 mg at 05/14/22 1610   magnesium hydroxide (MILK OF MAGNESIA) suspension 30 mL  30 mL Oral Daily PRN Bennett, Christal H, NP       nicotine (NICODERM CQ - dosed in mg/24 hours) patch 14 mg  14 mg Transdermal Daily Sarina Ill, DO   14 mg at 05/14/22 9604   risperiDONE (RISPERDAL) tablet 2 mg  2 mg Oral [id number] Sarina Ill, DO   2 mg at 05/14/22 5409     Discharge Medications: Please see discharge summary for a list of discharge medications.  Relevant Imaging  Results:  Relevant Lab Results:   Additional Information Social Security number [id number]  [place], [place]

## 2022-05-14 NOTE — Plan of Care (Signed)
  Problem: Education: Goal: Knowledge of General Education information will improve Description: Including pain rating scale, medication(s)/side effects and non-pharmacologic comfort measures Outcome: Progressing   Problem: Health Behavior/Discharge Planning: Goal: Ability to manage health-related needs will improve Outcome: Progressing   Problem: Clinical Measurements: Goal: Ability to maintain clinical measurements within normal limits will improve Outcome: Progressing Goal: Will remain free from infection Outcome: Progressing Goal: Diagnostic test results will improve Outcome: Progressing Goal: Respiratory complications will improve Outcome: Progressing Goal: Cardiovascular complication will be avoided Outcome: Progressing   Problem: Activity: Goal: Risk for activity intolerance will decrease Outcome: Progressing   Problem: Self-Concept: Goal: Ability to identify factors that promote anxiety will improve Outcome: Progressing Goal: Level of anxiety will decrease Outcome: Progressing Goal: Ability to modify response to factors that promote anxiety will improve Outcome: Progressing   Problem: Coping: Goal: Ability to identify and develop effective coping behavior will improve Outcome: Progressing   Problem: Education: Goal: Ability to state activities that reduce stress will improve Outcome: Progressing

## 2022-05-14 NOTE — Group Note (Signed)
LCSW Group Therapy Note   Group Date: 05/14/2022 Start Time: 1300 End Time: 1400   Type of Therapy and Topic:  Group Therapy: Boundaries  Participation Level:  Active  Description of Group: This group will address the use of boundaries in their personal lives. Patients will explore why boundaries are important, the difference between healthy and unhealthy boundaries, and negative and postive outcomes of different boundaries and will look at how boundaries can be crossed.  Patients will be encouraged to identify current boundaries in their own lives and identify what kind of boundary is being set. Facilitators will guide patients in utilizing problem-solving interventions to address and correct types boundaries being used and to address when no boundary is being used. Understanding and applying boundaries will be explored and addressed for obtaining and maintaining a balanced life. Patients will be encouraged to explore ways to assertively make their boundaries and needs known to significant others in their lives, using other group members and facilitator for role play, support, and feedback.  Therapeutic Goals:  1.  Patient will identify areas in their life where setting clear boundaries could be  used to improve their life.  2.  Patient will identify signs/triggers that a boundary is not being respected. 3.  Patient will identify two ways to set boundaries in order to achieve balance in  their lives: 4.  Patient will demonstrate ability to communicate their needs and set boundaries  through discussion and/or role plays  Summary of Patient Progress:  Patient was present and active throughout the session and proved open to feedback from CSW. Patient demonstrated fair insight into the subject matter, was respectful of peers, and was present throughout the entire session.  Therapeutic Modalities:   Cognitive Behavioral Therapy Solution-Focused Therapy  [place] Mo, LCSWA 05/14/2022   2:14 PM

## 2022-05-14 NOTE — Progress Notes (Signed)
PT Cancellation Note  Patient Details Name: [name] MRN: [id number] DOB: October 28, 1960   Cancelled Treatment:    Reason Eval/Treat Not Completed: PT screened, no needs identified, will sign off (Per RN, pt's mobility continue to improve in safety and independence,). Pt performing transfer, AMB in hallway, turning in place, all performed at independent level. No sway or LOB noted as previously seen. Pt reports to feel generally good about his mobility, denies any concerns. No skilled PT services needed at this time. Pt mobilizing on unit independently, ad lib, and frequently. Will sign off at this time.   11:40 AM, 05/14/22 Rosamaria Lints, PT, DPT Physical Therapist - [facility]  [phone] (ASCOM)     Jendayi Berling C 05/14/2022, 11:38 AM

## 2022-05-14 NOTE — Group Note (Signed)
Date:  05/14/2022 Time:  8:23 PM  Group Topic/Focus:  Personal Choices and Values:   The focus of this group is to help patients assess and explore the importance of values in their lives, how their values affect their decisions, how they express their values and what opposes their expression.    Participation Level:  Active  Participation Quality:  Appropriate  Affect:  Appropriate  Cognitive:  Appropriate  Insight: Good  Engagement in Group:  Engaged  Modes of Intervention:  Problem-solving  Additional Comments:    [name] 05/14/2022, 8:23 PM

## 2022-05-14 NOTE — Progress Notes (Signed)
Patient is A/O x 4. He denied SI, HI, hallucinations, and delusions.  He spoke with this Clinical research associate about concerns of placement in a Group Home when he is discharged.  He would like to speak with his Guardian, and this Clinical research associate will pass this information on in report.  This Clinical research associate also email Calvin, LCSW, & Micheala, LCSW, about possible openings.  He is able to voice his complaints & concerns appropriately.  No s/sx. of acute distress.  He remains compliant and was pleasant during this shift.  He took his medications without difficulty.  No adverse reactions to his medications.  He explained that he currently is walking with a walker because he has "a disease in my foot".  He reported that he likes his toenails to be trimmed.  Patient respirations even & unlabored during rounds.  Plan of Care remains in place.  Q47m safety checks completed throughout the shift.

## 2022-05-14 NOTE — Progress Notes (Signed)
   05/14/22 2200  Psych Admission Type (Psych Patients Only)  Admission Status Voluntary  Psychosocial Assessment  Patient Complaints None  Eye Contact Fair  Facial Expression Sullen  Affect Appropriate to circumstance  Speech Tangential  Interaction Needy  Motor Activity Unsteady;Slow  Appearance/Hygiene Unremarkable  Behavior Characteristics Cooperative  Mood Anxious  Thought Process  Coherency Tangential;Flight of ideas  Content WDL  Delusions None reported or observed  Perception UTA  Hallucination None reported or observed  Judgment Limited  Confusion Moderate  Danger to Self  Current suicidal ideation? Denies  Danger to Others  Danger to Others None reported or observed

## 2022-05-14 NOTE — Group Note (Signed)
Date:  05/14/2022 Time:  12:20 PM  Group Topic/Focus:  Personal Development     Participation Level:  Active  Participation Quality:  Appropriate  Affect:  Appropriate  Cognitive:  Alert and Appropriate  Insight: Appropriate  Engagement in Group:  Engaged  Modes of Intervention:  Discussion  Additional Comments:    Marta Antu 05/14/2022, 12:20 PM

## 2022-05-15 DIAGNOSIS — F250 Schizoaffective disorder, bipolar type: Secondary | ICD-10-CM | POA: Diagnosis not present

## 2022-05-15 NOTE — [facility] Group Notes (Signed)
[facility] Group Notes:  (Nursing/MHT/Case Management/Adjunct)  Date:  05/15/2022  Time:  9:11 AM  Type of Therapy:  Movement Therapy  Participation Level:  Active  Participation Quality:  Appropriate  Affect:  Appropriate  Cognitive:  Appropriate  Insight:  Appropriate  Engagement in Group:  Engaged  Modes of Intervention:  Activity  Summary of Progress/Problems:  Merlene Morse 05/15/2022, 9:11 AM

## 2022-05-15 NOTE — Group Note (Signed)
Recreation Therapy Group Note   Group Topic:Communication  Group Date: 05/15/2022 Start Time: 1400 End Time: 1500 Facilitators: Rosina Lowenstein, LRT, CTRS Location: Courtyard  Group Description: Emotional Check in. Patient sat and talked with LRT about how they are doing and whatever else is on their mind. LRT provided active listening, reassurance and encouragement. Pts were given the opportunity to listen to a meditation recording, play with a deck of cards, or listen to music while enjoying fresh air and sunlight in the courtyard.   Goal Area(s) Addressed: Patient will engage in conversation with LRT. Patient will communicate their wants, needs, or questions.  Patient will practice a new coping skill of "talking to someone". Patient will practice a leisure interest of listening to music and or going outside for fresh air and sunlight.   Affect/Mood: Appropriate   Participation Level: Active and Engaged   Participation Quality: Independent   Behavior: Calm and Cooperative   Speech/Thought Process: Coherent   Insight: Good and Improved   Judgement: Good   Modes of Intervention: Guided Discussion   Patient Response to Interventions:  Attentive, Engaged, Interested , and Receptive   Education Outcome:  Acknowledges education   Clinical Observations/Individualized Feedback: Aleksei was active in their participation of session activities and group discussion. Pt identified "I am looking forward to getting out of here any day now. I really wish I could smoke a cigarette out here." Conversation with pt seemed to be more linear and logical than in previous interactions. Pt had a bright affect when interacted with peers and LRT.   Plan: Continue to engage patient in RT group sessions 2-3x/week.   [address] LRT, CTRS 05/15/2022 3:21 PM

## 2022-05-15 NOTE — Progress Notes (Signed)
Patient A&Ox4. Patient observed in dayroom watching tv and interacting with peers appropriately. Pt denies SI/Hi and AVH. Denies any physical complaints when asked. No acute distress noted. Support and encouragement provided. Routine safety checks conducted according to facility protocol. Encouraged patient to notify staff if thoughts of harm toward self or others arise. Patient verbalize understanding and agreement. Will continue to monitor for safety.

## 2022-05-15 NOTE — Progress Notes (Signed)
   05/15/22 0924  Psych Admission Type (Psych Patients Only)  Admission Status Voluntary  Psychosocial Assessment  Patient Complaints None  Eye Contact Fair  Facial Expression Sullen  Affect Appropriate to circumstance  Speech Tangential  Interaction Needy  Motor Activity Slow;Unsteady  Appearance/Hygiene Unremarkable  Behavior Characteristics Cooperative  Mood Anxious  Thought Chartered certified accountant of ideas;Tangential  Content WDL  Delusions None reported or observed  Perception UTA  Hallucination None reported or observed  Judgment Limited  Confusion Moderate  Danger to Self  Current suicidal ideation? Denies  Danger to Others  Danger to Others None reported or observed

## 2022-05-15 NOTE — Plan of Care (Signed)
  Problem: Education: Goal: Knowledge of General Education information will improve Description Including pain rating scale, medication(s)/side effects and non-pharmacologic comfort measures Outcome: Progressing   Problem: Health Behavior/Discharge Planning: Goal: Ability to manage health-related needs will improve Outcome: Progressing   Problem: Clinical Measurements: Goal: Ability to maintain clinical measurements within normal limits will improve Outcome: Progressing Goal: Will remain free from infection Outcome: Progressing Goal: Diagnostic test results will improve Outcome: Progressing Goal: Respiratory complications will improve Outcome: Progressing Goal: Cardiovascular complication will be avoided Outcome: Progressing   Problem: Activity: Goal: Risk for activity intolerance will decrease Outcome: Progressing   Problem: Coping: Goal: Level of anxiety will decrease Outcome: Progressing   Problem: Pain Managment: Goal: General experience of comfort will improve Outcome: Progressing   Problem: Safety: Goal: Ability to remain free from injury will improve Outcome: Progressing   
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
## 2022-05-15 NOTE — Group Note (Signed)
[facility] LCSW Group Therapy Note    Group Date: 05/15/2022 Start Time: 1500 End Time: 1530  Type of Therapy and Topic:  Group Therapy:  Overcoming Obstacles  Participation Level:  [facility] PARTICIPATION LEVEL: Active  Mood:  Description of Group:   In this group patients will be encouraged to explore what they see as obstacles to their own wellness and recovery. They will be guided to discuss their thoughts, feelings, and behaviors related to these obstacles. The group will process together ways to cope with barriers, with attention given to specific choices patients can make. Each patient will be challenged to identify changes they are motivated to make in order to overcome their obstacles. This group will be process-oriented, with patients participating in exploration of their own experiences as well as giving and receiving support and challenge from other group members.  Therapeutic Goals: 1. Patient will identify personal and current obstacles as they relate to admission. 2. Patient will identify barriers that currently interfere with their wellness or overcoming obstacles.  3. Patient will identify feelings, thought process and behaviors related to these barriers. 4. Patient will identify two changes they are willing to make to overcome these obstacles:    Summary of Patient Progress Patient was present in group. Patient was an active participant. Patient was engaged and appropriate.  Patient shared how his mental health has been an obstacle in his path recently.  He was able to identify how this has impacted his placement and caused another issues.  He reports that he tries to cope by relaxing.  He reports that he takes walks as a way to deescalate.   Therapeutic Modalities:   Cognitive Behavioral Therapy Solution Focused Therapy Motivational Interviewing Relapse Prevention Therapy   Harden Mo, LCSW

## 2022-05-16 DIAGNOSIS — F250 Schizoaffective disorder, bipolar type: Secondary | ICD-10-CM | POA: Diagnosis not present

## 2022-05-16 NOTE — Group Note (Signed)
Date:  05/16/2022 Time:  10:25 AM  Group Topic/Focus:  Recovery Goals:   The focus of this group is to identify appropriate goals for recovery and establish a plan to achieve them. Relapse Prevention Planning:   The focus of this group is to define relapse and discuss the need for planning to combat relapse.    Participation Level:  Active  Participation Quality:  Appropriate and Attentive  Affect:  Appropriate  Cognitive:  Appropriate  Insight: Good  Engagement in Group:  Improving  Modes of Intervention:  Problem-solving  Additional Comments:  working on goal on how to be discharged  [name] E [name] 05/16/2022, 10:25 AM

## 2022-05-16 NOTE — Group Note (Addendum)
Date:  05/16/2022 Time:  8:59 PM  Group Topic/Focus:  Recovery Goals:   The focus of this group is to identify appropriate goals for recovery and establish a plan to achieve them.    Participation Level:  Active  Participation Quality:  Sharing  Affect:  Appropriate  Cognitive:  Lacking  Insight: Good  Engagement in Group:  Engaged  Modes of Intervention:  Discussion and Education  Additional Comments:  Osker Mason 05/16/2022, 8:59 PM
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
## 2022-05-16 NOTE — Progress Notes (Signed)
   05/16/22 2200  Psych Admission Type (Psych Patients Only)  Admission Status Voluntary  Psychosocial Assessment  Patient Complaints None  Eye Contact Fair  Facial Expression Sad  Affect Appropriate to circumstance  Speech Slurred (baseline)  Interaction Assertive  Motor Activity Unsteady;Slow  Appearance/Hygiene Unremarkable  Behavior Characteristics Cooperative  Mood Pleasant;Anxious  Thought Process  Coherency Tangential;Flight of ideas  Content WDL  Delusions None reported or observed  Perception WDL  Hallucination None reported or observed  Judgment Limited  Confusion Moderate  Danger to Self  Current suicidal ideation? Denies  Danger to Others  Danger to Others None reported or observed     D- Patient alert and oriented to person and place.  Patient affect/mood "I am doing pretty good, I cannot complain." Patient has a very pleasant mood. Patient states, "I am just waiting to hear when I can leave from here."  Denies SI, HI, AVH, and pain.  A- Scheduled medications administered to patient, per MD orders. Support and encouragement provided.  Routine safety checks conducted every 15 minutes.  Patient informed to notify staff with problems or concerns. R- No adverse drug reactions noted. Patient contracts for safety at this time. Patient compliant with medications and treatment plan. Patient receptive, calm, and cooperative. Patient interacts well with others on the unit.  Patient remains safe at this time.

## 2022-05-16 NOTE — Progress Notes (Signed)
   05/16/22 0300  Psych Admission Type (Psych Patients Only)  Admission Status Voluntary  Psychosocial Assessment  Patient Complaints None  Eye Contact Fair  Facial Expression Sullen  Affect Appropriate to circumstance  Speech Tangential  Interaction Needy  Motor Activity Slow;Unsteady  Appearance/Hygiene Unremarkable  Behavior Characteristics Cooperative  Mood Anxious  Thought Process  Coherency Tangential;Flight of ideas  Content WDL  Delusions None reported or observed  Perception UTA  Hallucination None reported or observed  Judgment Limited  Confusion Moderate  Danger to Self  Current suicidal ideation? Denies  Danger to Others  Danger to Others None reported or observed

## 2022-05-16 NOTE — Progress Notes (Signed)
Patient is alert and oriented. He denies SI/HI/AVH. Pain 0/10. Appetite good. Adherent to medications. Patient attended group and participated in rec therapy.  No PRN's requested. No distress noted or voiced. No change in orders. Discharge planning ongoing.   Q15 minute unit checks in place.

## 2022-05-16 NOTE — Group Note (Signed)
Recreation Therapy Group Note   Group Topic:Goal Setting  Group Date: 05/16/2022 Start Time: 1400 End Time: 1440 Facilitators: Rosina Lowenstein, LRT, CTRS Location:  Dayroom  Group Description: Scientist, research (physical sciences). Patients were given many different magazines, a glue stick, markers, and a piece of cardstock paper. LRT and pts discussed the importance of having goals in life. LRT and pts discussed the difference between short-term and long-term goals, as well as what a SMART goal is. LRT encouraged pts to create a vision board, with images they picked and then cut out with safety scissors from the magazine, for themselves, that capture their short and long-term goals. LRT encouraged pts to show and explain their vision board to the group. LRT offered to laminate vision board once dry and complete.   Goal Area(s) Addressed:  Patient will gain knowledge of short vs. long term goals.  Patient will identify goals for themselves. Patient will practice setting SMART goals.  Affect/Mood: Appropriate   Participation Level: Active and Engaged   Participation Quality: Independent   Behavior: Appropriate and Calm   Speech/Thought Process: Coherent and Flight of ideas   Insight: Good   Judgement: Fair    Modes of Intervention: Art   Patient Response to Interventions:  Attentive, Engaged, Interested , and Receptive   Education Outcome:  Acknowledges education   Clinical Observations/Individualized Feedback: Saddiq was active in their participation of session activities and group discussion. Pt identified and cut out 4 different pictures of babies and young girls. Pt shared that he wanted to have a little girl and that he would name her "[place]". Pt also referenced [address] in the [place] and said that there was a place in the [place] named [place]. Pt followed directions and interacted well with peers and LRT duration of session.    Plan: Continue to engage patient in RT group sessions  2-3x/week.   Rosina Lowenstein, LRT, CTRS 05/16/2022 2:52 PM

## 2022-05-17 DIAGNOSIS — F250 Schizoaffective disorder, bipolar type: Secondary | ICD-10-CM | POA: Diagnosis not present

## 2022-05-17 NOTE — Plan of Care (Signed)
  Problem: Safety: Goal: Ability to remain free from injury will improve Outcome: Progressing   Problem: Skin Integrity: Goal: Risk for impaired skin integrity will decrease Outcome: Progressing   Problem: Coping: Goal: Ability to identify and develop effective coping behavior will improve Outcome: Progressing

## 2022-05-17 NOTE — BH IP Treatment Plan (Signed)
Interdisciplinary Treatment and Diagnostic Plan Update  05/17/2022 Time of Session: 2:24 pm [name] MRN: [id number]  Principal Diagnosis: Schizoaffective disorder, bipolar type [facility])  Secondary Diagnoses: Principal Problem:   Schizoaffective disorder, bipolar type (HCC)   Current Medications:  Current Facility-Administered Medications  Medication Dose Route Frequency Provider Last Rate Last Admin   acetaminophen (TYLENOL) tablet 650 mg  650 mg Oral Q6H PRN Bennett, Christal H, NP   650 mg at 05/07/22 0832   alum & mag hydroxide-simeth (MAALOX/MYLANTA) 200-200-20 MG/5ML suspension 30 mL  30 mL Oral Q4H PRN Bennett, Christal H, NP       amantadine (SYMMETREL) capsule 100 mg  100 mg Oral BID Bennett, Christal H, NP   100 mg at 05/17/22 0913   amLODipine (NORVASC) tablet 5 mg  5 mg Oral Daily Bennett, Christal H, NP   5 mg at 05/17/22 0913   atorvastatin (LIPITOR) tablet 40 mg  40 mg Oral QHS Bennett, Christal H, NP   40 mg at 05/16/22 2110   carbamazepine (TEGRETOL XR) 12 hr tablet 400 mg  400 mg Oral QHS Sarina Ill, DO   400 mg at 05/16/22 2110   chlorproMAZINE (THORAZINE) tablet 50 mg  50 mg Oral Q6H PRN Sarina Ill, DO       fluticasone furoate-vilanterol (BREO ELLIPTA) 200-25 MCG/ACT 1 puff  1 puff Inhalation Daily Bennett, Christal H, NP   1 puff at 05/17/22 0913   LORazepam (ATIVAN) tablet 2 mg  2 mg Oral TID PRN Thurston Hole H, NP   2 mg at 05/11/22 2326   Or   LORazepam (ATIVAN) injection 2 mg  2 mg Intramuscular TID PRN Willeen Cass, Christal H, NP   2 mg at 05/09/22 1031   losartan (COZAAR) tablet 100 mg  100 mg Oral Daily Sarina Ill, DO   100 mg at 05/17/22 0913   magnesium hydroxide (MILK OF MAGNESIA) suspension 30 mL  30 mL Oral Daily PRN Bennett, Christal H, NP       nicotine (NICODERM CQ - dosed in mg/24 hours) patch 14 mg  14 mg Transdermal Daily Sarina Ill, DO   14 mg at 05/17/22 0914   risperiDONE (RISPERDAL)  tablet 2 mg  2 mg Oral [id number] Sarina Ill, DO   2 mg at 05/17/22 0913   PTA Medications: Medications Prior to Admission  Medication Sig Dispense Refill Last Dose   amantadine (SYMMETREL) 100 MG capsule Take 100 mg by mouth 2 (two) times daily.   05/06/2022   atorvastatin (LIPITOR) 40 MG tablet Take 1 tablet (40 mg total) by mouth at bedtime. 30 tablet 1 05/06/2022   lithium carbonate (LITHOBID) 300 MG CR tablet Take 1 tablet (300 mg total) by mouth 3 (three) times daily. (Patient taking differently: Take 600 mg by mouth at bedtime.) 90 tablet 1 05/06/2022   amLODipine (NORVASC) 5 MG tablet Take 1 tablet (5 mg total) by mouth daily. 30 tablet 1 Unknown   fluticasone furoate-vilanterol (BREO ELLIPTA) 200-25 MCG/ACT AEPB Inhale 1 puff into the lungs daily. 1 each 1 Unknown   losartan (COZAAR) 50 MG tablet Take 1 tablet (50 mg total) by mouth daily. 30 tablet 1 Unknown   OLANZapine (ZYPREXA) 10 MG tablet Take 1 tablet (10 mg total) by mouth daily. (Patient not taking: Reported on 05/02/2022) 30 tablet 1 Unknown   OLANZapine-Samidorphan (LYBALVI) 10-10 MG TABS Take 1 tablet by mouth daily.   Unknown   QUEtiapine (SEROQUEL) 200 MG tablet Take 1  tablet (200 mg total) by mouth at bedtime. (Patient not taking: Reported on 05/02/2022) 30 tablet 1 Unknown   risperiDONE (RISPERDAL M-TABS) 1 MG disintegrating tablet Take 1 tablet (1 mg total) by mouth 2 (two) times daily as needed (agitation). 60 tablet 1 Unknown    Patient Stressors: Marital or family conflict   Traumatic event    Patient Strengths: Ability for insight  Motivation for treatment/growth   Treatment Modalities: Medication Management, Group therapy, Case management,  1 to 1 session with clinician, Psychoeducation, Recreational therapy.   Physician Treatment Plan for Primary Diagnosis: Schizoaffective disorder, bipolar type (HCC) Long Term Goal(s): Improvement in symptoms so as ready for discharge   Short Term Goals: Ability to  identify changes in lifestyle to reduce recurrence of condition will improve Ability to verbalize feelings will improve Ability to disclose and discuss suicidal ideas Ability to demonstrate self-control will improve Ability to identify and develop effective coping behaviors will improve Ability to maintain clinical measurements within normal limits will improve Compliance with prescribed medications will improve Ability to identify triggers associated with substance abuse/mental health issues will improve  Medication Management: Evaluate patient's response, side effects, and tolerance of medication regimen.  Therapeutic Interventions: 1 to 1 sessions, Unit Group sessions and Medication administration.  Evaluation of Outcomes: Progressing  Physician Treatment Plan for Secondary Diagnosis: Principal Problem:   Schizoaffective disorder, bipolar type (HCC)  Long Term Goal(s): Improvement in symptoms so as ready for discharge   Short Term Goals: Ability to identify changes in lifestyle to reduce recurrence of condition will improve Ability to verbalize feelings will improve Ability to disclose and discuss suicidal ideas Ability to demonstrate self-control will improve Ability to identify and develop effective coping behaviors will improve Ability to maintain clinical measurements within normal limits will improve Compliance with prescribed medications will improve Ability to identify triggers associated with substance abuse/mental health issues will improve     Medication Management: Evaluate patient's response, side effects, and tolerance of medication regimen.  Therapeutic Interventions: 1 to 1 sessions, Unit Group sessions and Medication administration.  Evaluation of Outcomes: Progressing   RN Treatment Plan for Primary Diagnosis: Schizoaffective disorder, bipolar type (HCC) Long Term Goal(s): Knowledge of disease and therapeutic regimen to maintain health will improve  Short  Term Goals: Ability to remain free from injury will improve, Ability to verbalize frustration and anger appropriately will improve, Ability to demonstrate self-control, Ability to participate in decision making will improve, Ability to verbalize feelings will improve, Ability to disclose and discuss suicidal ideas, Ability to identify and develop effective coping behaviors will improve, and Compliance with prescribed medications will improve  Medication Management: RN will administer medications as ordered by provider, will assess and evaluate patient's response and provide education to patient for prescribed medication. RN will report any adverse and/or side effects to prescribing provider.  Therapeutic Interventions: 1 on 1 counseling sessions, Psychoeducation, Medication administration, Evaluate responses to treatment, Monitor vital signs and CBGs as ordered, Perform/monitor CIWA, COWS, AIMS and Fall Risk screenings as ordered, Perform wound care treatments as ordered.  Evaluation of Outcomes: Progressing   LCSW Treatment Plan for Primary Diagnosis: Schizoaffective disorder, bipolar type (HCC) Long Term Goal(s): Safe transition to appropriate next level of care at discharge, Engage patient in therapeutic group addressing interpersonal concerns.  Short Term Goals: Engage patient in aftercare planning with referrals and resources, Increase social support, Increase ability to appropriately verbalize feelings, Increase emotional regulation, Facilitate acceptance of mental health diagnosis and concerns, and Increase skills for  wellness and recovery  Therapeutic Interventions: Assess for all discharge needs, 1 to 1 time with Social worker, Explore available resources and support systems, Assess for adequacy in community support network, Educate family and significant other(s) on suicide prevention, Complete Psychosocial Assessment, Interpersonal group therapy.  Evaluation of Outcomes:  Progressing   Progress in Treatment: Attending groups: Yes. Participating in groups: Yes. Taking medication as prescribed: Yes. Toleration medication: Yes. Family/Significant other contact made: Yes, individual(s) contacted:  [facility]/ [name] [phone] Patient understands diagnosis: No. Discussing patient identified problems/goals with staff: No. Medical problems stabilized or resolved: Yes. Denies suicidal/homicidal ideation: Yes. Issues/concerns per patient self-inventory: No. Other: none  New problem(s) identified: No, Describe:  none Update 05/12/2022:  No changes at this time. Update 05/17/22: No changes at this time.   New Short Term/Long Term Goal(s): elimination of symptoms of psychosis, medication management for mood stabilization; elimination of SI thoughts; development of comprehensive mental wellness/sobriety plan. Update 05/12/2022:  No changes at this time Update 05/17/22: No changes at this time.  Patient Goals:  Patient unable to participate in treatment team.  Patient was asleep following administration of medication during agitation protocol. Update 05/12/2022:  Patient remains safe on the unit at this time.  Patient continues to clear up as time progresses.  Patient, however, is still not at baseline.  Patient able to return to his group home, however, will have to leave as he has been served a notice.  Update 05/17/22: More clear and able to have conversations with you.   Discharge Plan or Barriers: CSW to assist with discharge plans.  Update 05/12/2022:  No changes at this time.  Update 05/17/22: Patient remains a placement concern. Guardian is working to identify possible group home placement.   Reason for Continuation of Hospitalization: Anxiety Delusions  Depression Hallucinations Medical Issues Medication stabilization  Estimated Length of Stay: 1-7 days Update 05/12/2022: TBD  Update 05/17/22: TBD  Last 3 [place] Suicide Severity Risk Score: Flowsheet Row  Admission (Current) from 05/06/2022 in [facility] BE[facility]IORAL [facility] ED from [facility]02[facility]2022 [facility] (Discharged) from 05/02/2021 in [facility] INPATIENT BEHAVIORAL MEDICINE  C-SSRS RISK CATEGORY No Risk No Risk No Risk       Last PHQ 2/9 Scores:     No data to display          Scribe for Treatment Team: Marshell Levan, LCSW 05/17/2022 2:24 PM

## 2022-05-17 NOTE — Group Note (Signed)
Date:  05/17/2022 Time:  11:30 PM  Group Topic/Focus:  Building Self Esteem:   The Focus of this group is helping patients become aware of the effects of self-esteem on their lives, the things they and others do that enhance or undermine their self-esteem, seeing the relationship between their level of self-esteem and the choices they make and learning ways to enhance self-esteem.    Participation Level:  Active  Participation Quality:  Appropriate  Affect:  Appropriate  Cognitive:  Appropriate  Insight: Good  Engagement in Group:  Engaged  Modes of Intervention:  Discussion  Additional Comments:    [name] 05/17/2022, 11:30 PM

## 2022-05-17 NOTE — Progress Notes (Signed)
Patient cooperative and pleasant.  Patient reports good appetite and slept well last night.  Denies feelings of anxiety or depression.  Denies SI,HI and AVH.  Denies pain.  15 min checks in place for safety.  Compliant with scheduled medications.  Patient is present in the milieu.  Appropriate interaction with peers. Attended MHT group.
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
## 2022-05-17 NOTE — [facility] Group Notes (Signed)
[facility] Group Notes:  (Nursing/MHT/Case Management/Adjunct)  Date:  05/17/2022  Time:  10:42 AM  Type of Therapy:   meditation therapy  Participation Level:  Active  Participation Quality:  Appropriate  Affect:  Appropriate  Cognitive:  Appropriate  Insight:  Appropriate  Engagement in Group:  Engaged  Modes of Intervention:  Activity  Summary of Progress/Problems:  [name] 05/17/2022, 10:42 AM

## 2022-05-17 NOTE — Progress Notes (Signed)
   05/17/22 0600  15 Minute Checks  Location Bedroom  Visual Appearance Calm  Behavior Sleeping  Sleep (Behavioral Health Patients Only)  Calculate sleep? (Click Yes once per 24 hr at 0600 safety check) Yes  Documented sleep last 24 hours 8.75

## 2022-05-17 NOTE — Progress Notes (Signed)
   05/17/22 2300  Psych Admission Type (Psych Patients Only)  Admission Status Voluntary  Psychosocial Assessment  Patient Complaints None  Eye Contact Fair  Facial Expression Flat  Affect Appropriate to circumstance  Speech Unremarkable  Interaction Assertive  Motor Activity Unsteady  Appearance/Hygiene Unremarkable  Behavior Characteristics Cooperative;Appropriate to situation  Thought Process  Coherency Tangential  Content WDL  Delusions None reported or observed  Perception WDL  Hallucination None reported or observed  Judgment Limited  Confusion Moderate  Danger to Self  Current suicidal ideation? Denies  Danger to Others  Danger to Others None reported or observed

## 2022-05-18 DIAGNOSIS — F250 Schizoaffective disorder, bipolar type: Secondary | ICD-10-CM | POA: Diagnosis not present

## 2022-05-18 NOTE — [facility] Group Notes (Signed)
[facility] Group Notes:  (Nursing/MHT/Case Management/Adjunct)  Date:  05/18/2022  Time:  9:47 AM  Type of Therapy:  Movement Therapy  Participation Level:  Active  Participation Quality:  Appropriate  Affect:  Appropriate  Cognitive:  Appropriate  Insight:  Appropriate  Engagement in Group:  Engaged  Modes of Intervention:  Activity  Summary of Progress/Problems:  [name] 05/18/2022, 9:47 AM

## 2022-05-18 NOTE — Group Note (Signed)
LCSW Group Therapy Note  Group Date: 05/18/2022 Start Time: 1300 End Time: 1330   Type of Therapy and Topic:  Group Therapy - Coping Skills  Participation Level:  Active   Description of Group The focus of this group was to determine what unhealthy coping techniques typically are used by group members and what healthy coping techniques would be helpful in coping with various problems. Patients were guided in becoming aware of the differences between healthy and unhealthy coping techniques. Patients were asked to identify 2-3 healthy coping skills they would like to learn to use more effectively.  Therapeutic Goals Patients learned that coping is what human beings do all day long to deal with various situations in their lives Patients defined and discussed healthy vs unhealthy coping techniques Patients identified their preferred coping techniques and identified whether these were healthy or unhealthy Patients determined 2-3 healthy coping skills they would like to become more familiar with and use more often. Patients provided support and ideas to each other   Summary of Patient Progress:  The patient attended group. The patient stated that he would rather explore the sea rather than space because he likes the sea. The patient stated that he likes dancing to [name] and finding things around the house to do. The patient stated that when he feels sad praying helps him cope.    Marshell Levan, LCSWA 05/18/2022  2:07 PM
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
## 2022-05-18 NOTE — Progress Notes (Signed)
Patient is alert and oriented. Mood pleasant. Affect appropriate. He denies SI/HI/AVH. Pain 0/10. He denies anxiety and depression.   Patient attended groups. No PRN's requested.  Q15 minute unit checks in place.

## 2022-05-18 NOTE — Plan of Care (Signed)
  Problem: Activity: Goal: Risk for activity intolerance will decrease Outcome: Progressing   Problem: Nutrition: Goal: Adequate nutrition will be maintained Outcome: Progressing   Problem: Elimination: Goal: Will not experience complications related to bowel motility Outcome: Progressing   

## 2022-05-19 DIAGNOSIS — F250 Schizoaffective disorder, bipolar type: Secondary | ICD-10-CM | POA: Diagnosis not present

## 2022-05-19 NOTE — Group Note (Signed)
Date:  05/19/2022 Time:  6:41 PM  Group Topic/Focus:  Overcoming Stress:   The focus of this group is to define stress and help patients assess their triggers. Rediscovering Joy:   The focus of this group is to explore various ways to relieve stress in a positive manner. Self Care:   The focus of this group is to help patients understand the importance of self-care in order to improve or restore emotional, physical, spiritual, interpersonal, and financial health.    Participation Level:  Active  Participation Quality:  Appropriate  Affect:  Appropriate  Cognitive:  Alert  Insight: Appropriate  Engagement in Group:  Engaged  Modes of Intervention:  Socialization and Movie   Additional Comments:    Dimas Scheck l Mitzie Na 05/19/2022, 6:41 PM
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
## 2022-05-19 NOTE — Group Note (Signed)
LCSW Group Therapy Note   Group Date: 05/19/2022 Start Time: 1305 End Time: 1400  Type of Therapy and Topic: Group Therapy: Worry and Anxiety  Participation Level: [facility] PARTICIPATION LEVEL: Active  Description of Group: In this group, patients will be encouraged to explore their worry around what could happen vs what will happen. Each patient will be challenged to think of personal worries and how they will work their way through that worry around what will happen and what could happen. This group will be process-oriented, with patients participating in exploration of their own experiences as well as giving and receiving support and challenge from other group members.  Therapeutic Goals: Patient will identify personal worries that cause anxiety. Patient will identify clues to identify their worry. Patient will identify ways to handle their worry. Patient will discuss ways that their worry has deceased or why it has not decreased.   Summary of Patient Progress: Patient was present in group.  Patient was attentive and engaged.  Patient was supportive of others.  Patient reports that when feeling anxious he feels "restlessness".  He reports that his coping skills is to "wait it out".  Patient reports that he practices patience.  Therapeutic Modalities:  Cognitive Behavioral Therapy Solution Focused Therapy Motivational Interviewing   Harden Mo, LCSW 05/19/2022  2:12 PM

## 2022-05-19 NOTE — Progress Notes (Signed)
   05/19/22 1600  Psych Admission Type (Psych Patients Only)  Admission Status Voluntary  Psychosocial Assessment  Patient Complaints None  Eye Contact Fair  Facial Expression Flat  Affect Appropriate to circumstance  Speech Slurred  Interaction Assertive  Motor Activity Unsteady  Appearance/Hygiene In scrubs  Behavior Characteristics Cooperative  Mood Pleasant  Thought Process  Coherency Circumstantial  Content WDL  Delusions None reported or observed  Perception WDL  Hallucination None reported or observed  Judgment Impaired  Confusion Mild  Danger to Self  Current suicidal ideation? Denies  Danger to Others  Danger to Others None reported or observed

## 2022-05-19 NOTE — Progress Notes (Signed)
Patient A&Ox4. Patient denies SI/HI and AVH. No acute distress noted. Support and encouragement provided. Routine safety checks conducted according to facility protocol.Patient resting quietly in bed with eyes closed, Respirations equal and unlabored, skin warm and dry, NAD. Routine safety checks conducted according to facility protocol. Will continue to monitor for safety.

## 2022-05-19 NOTE — Group Note (Signed)
Recreation Therapy Group Note   Group Topic:Coping Skills  Group Date: 05/19/2022 Start Time: 1400 End Time: 1455 Facilitators: Rosina Lowenstein, LRT, CTRS Location: Courtyard  Group Description: Mind Map.  Patient was provided a blank template of a diagram with 32 blank boxes in a tiered system, branching from the center (similar to a bubble chart). LRT directed patients to label the middle of the diagram "Coping Skills". LRT and patients then came up with 8 different coping skills as examples. Pt were directed to record their coping skills in the 2nd tier boxes closest to the center.  Patients would then share their coping skills with the group as LRT wrote them out. LRT gave a handout of 100 different coping skills at the end of group.   Goal Area(s) Addressed: Patients will be able to define "coping skills". Patient will identify new coping skills.  Patient will identify new possible leisure interests.   Affect/Mood: Anxious   Participation Level: Active and Engaged   Participation Quality: Independent   Behavior: Appropriate, Calm, and Cooperative   Speech/Thought Process: Coherent   Insight: Good   Judgement: Good   Modes of Intervention: Guided Discussion and Worksheet   Patient Response to Interventions:  Attentive, Engaged, Interested , and Receptive   Education Outcome:  Acknowledges education   Clinical Observations/Individualized Feedback: Jaceon was active in their participation of session activities and group discussion. Pt identified "breathe it out" as a coping skill. Pt was observed coloring in the boxes once he filled the boxes with different coping skills. Pt spontaneously contributed to group discussion while interacting well with peers and LRT.   Plan: Continue to engage patient in RT group sessions 2-3x/week.   Rosina Lowenstein, LRT, CTRS 05/19/2022 3:14 PM

## 2022-05-19 NOTE — Group Note (Signed)
Date:  05/19/2022 Time:  10:02 AM  Group Topic/Focus:  Self Care:   The focus of this group is to help patients understand the importance of self-care in order to improve or restore emotional, physical, spiritual, interpersonal, and financial health.    Participation Level:  Active  Participation Quality:  Appropriate  Affect:  Appropriate  Cognitive:  Alert and Appropriate  Insight: Appropriate  Engagement in Group:  Engaged  Modes of Intervention:  Discussion  Additional Comments:    Marta Antu 05/19/2022, 10:02 AM

## 2022-05-19 NOTE — Plan of Care (Signed)
  Problem: Education: Goal: Knowledge of General Education information will improve Description: Including pain rating scale, medication(s)/side effects and non-pharmacologic comfort measures Outcome: Progressing   Problem: Health Behavior/Discharge Planning: Goal: Ability to manage health-related needs will improve Outcome: Progressing   Problem: Clinical Measurements: Goal: Ability to maintain clinical measurements within normal limits will improve Outcome: Progressing Goal: Will remain free from infection Outcome: Progressing Goal: Diagnostic test results will improve Outcome: Progressing Goal: Respiratory complications will improve Outcome: Progressing Goal: Cardiovascular complication will be avoided Outcome: Progressing   Problem: Activity: Goal: Risk for activity intolerance will decrease Outcome: Progressing   Problem: Elimination: Goal: Will not experience complications related to bowel motility Outcome: Progressing Goal: Will not experience complications related to urinary retention Outcome: Progressing   Problem: Pain Managment: Goal: General experience of comfort will improve Outcome: Progressing   Problem: Safety: Goal: Ability to remain free from injury will improve Outcome: Progressing   

## 2022-05-19 NOTE — Group Note (Signed)
Date:  05/19/2022 Time:  1:30 AM  Group Topic/Focus:  Building Self Esteem:   The Focus of this group is helping patients become aware of the effects of self-esteem on their lives, the things they and others do that enhance or undermine their self-esteem, seeing the relationship between their level of self-esteem and the choices they make and learning ways to enhance self-esteem.    Participation Level:  Active  Participation Quality:  Appropriate, Sharing, and Supportive  Affect:  Appropriate  Cognitive:  Alert and Appropriate  Insight: Appropriate, Good, and Improving  Engagement in Group:  Engaged and Supportive  Modes of Intervention:  Discussion and Support  Additional Comments:    [name] 05/19/2022, 1:30 AM

## 2022-05-19 NOTE — Progress Notes (Signed)
   05/19/22 0300  Psych Admission Type (Psych Patients Only)  Admission Status Voluntary  Psychosocial Assessment  Patient Complaints None  Eye Contact Fair  Facial Expression Flat  Affect Appropriate to circumstance  Speech Unremarkable  Interaction Assertive  Motor Activity Unsteady  Appearance/Hygiene Unremarkable  Behavior Characteristics Cooperative  Mood Pleasant  Thought Process  Coherency Tangential  Content WDL  Delusions None reported or observed  Perception WDL  Hallucination None reported or observed  Judgment Limited  Confusion Moderate  Danger to Self  Current suicidal ideation? Denies  Danger to Others  Danger to Others None reported or observed

## 2022-05-20 DIAGNOSIS — F250 Schizoaffective disorder, bipolar type: Secondary | ICD-10-CM | POA: Diagnosis not present

## 2022-05-20 NOTE — Group Note (Signed)
Date:  05/20/2022 Time:  11:11 PM  Group Topic/Focus:  Building Self Esteem:   The Focus of this group is helping patients become aware of the effects of self-esteem on their lives, the things they and others do that enhance or undermine their self-esteem, seeing the relationship between their level of self-esteem and the choices they make and learning ways to enhance self-esteem. Coping With Mental Health Crisis:   The purpose of this group is to help patients identify strategies for coping with mental health crisis.  Group discusses possible causes of crisis and ways to manage them effectively. Developing a Wellness Toolbox:   The focus of this group is to help patients develop a "wellness toolbox" with skills and strategies to promote recovery upon discharge. Goals Group:   The focus of this group is to help patients establish daily goals to achieve during treatment and discuss how the patient can incorporate goal setting into their daily lives to aide in recovery. Healthy Communication:   The focus of this group is to discuss communication, barriers to communication, as well as healthy ways to communicate with others. Making Healthy Choices:   The focus of this group is to help patients identify negative/unhealthy choices they were using prior to admission and identify positive/healthier coping strategies to replace them upon discharge. Overcoming Stress:   The focus of this group is to define stress and help patients assess their triggers. Personal Choices and Values:   The focus of this group is to help patients assess and explore the importance of values in their lives, how their values affect their decisions, how they express their values and what opposes their expression. Rediscovering Joy:   The focus of this group is to explore various ways to relieve stress in a positive manner.    Participation Level:  Active  Participation Quality:  Appropriate, Sharing, and Supportive  Affect:   Appropriate  Cognitive:  Alert and Appropriate  Insight: Appropriate, Good, and Improving  Engagement in Group:  Engaged, Improving, and Supportive  Modes of Intervention:  Activity, Problem-solving, and Support  Additional Comments:    [name] 05/20/2022, 11:11 PM

## 2022-05-20 NOTE — Group Note (Signed)
Recreation Therapy Group Note   Group Topic:Self-Esteem  Group Date: 05/20/2022 Start Time: 1400 End Time: 1455 Facilitators: Rosina Lowenstein, LRT, CTRS Location:  Dayroom  Group Description: Positive Focus. Patients are given a handout that has 9 different boxes on it. Each box has a different prompt on it that requires you to identify and add something positive about themselves. LRT encourages pts to share at least two of their boxes to the group. LRT and pts discuss the importance of "thinking positive", self-esteem and how they can apply it to their everyday life post-discharge. After completing worksheet, patients played Positive Affirmation Bingo and won stress balls, sudoku, or word search books as prizes.   Goal Area(s) Addressed: Patient will identify positive qualities about themselves. Patient will identify definition of "self-esteem". Patients will identify the difference between positive and negative self-esteem.  Patient will recite positive qualities and affirmations aloud to the group.  Affect/Mood: Appropriate   Participation Level: Active and Engaged   Participation Quality: Independent   Behavior: Appropriate, Calm, and Cooperative   Speech/Thought Process: Coherent   Insight: Good   Judgement: Good   Modes of Intervention: Cooperative Play and Worksheet   Patient Response to Interventions:  Attentive, Engaged, Interested , and Receptive   Education Outcome:  Acknowledges education   Clinical Observations/Individualized Feedback: [name] was active in their participation of session activities and group discussion. 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 A positive thing that I do well is quote the bible. A positive responsibility I have is feeding the kittens. A positive way I stay healthy is walking. A positive way I communicate with others is to pray for them and a positive way I cope with stress is meditation." Pt won a  game of bingo and chose a word search book as his prize. Pt interacted well with LRT and peers duration of session.    Plan: Continue to engage patient in RT group sessions 2-3x/week.   Rosina Lowenstein, LRT, CTRS 05/20/2022 3:15 PM

## 2022-05-20 NOTE — Progress Notes (Signed)
   05/20/22 2100  Psychosocial Assessment  Patient Complaints None  Eye Contact Fair  Facial Expression Animated  Affect Appropriate to circumstance  Speech Slurred  Interaction Assertive  Motor Activity Unsteady  Appearance/Hygiene In scrubs  Behavior Characteristics Cooperative;Calm  Mood Pleasant  Thought Process  Coherency Circumstantial  Content WDL  Delusions None reported or observed  Perception WDL  Hallucination None reported or observed  Judgment Impaired  Confusion Mild  Danger to Self  Current suicidal ideation? Denies  Danger to Others  Danger to Others None reported or observed

## 2022-05-20 NOTE — Progress Notes (Signed)
   05/20/22 1000  Psych Admission Type (Psych Patients Only)  Admission Status Voluntary  Psychosocial Assessment  Patient Complaints None  Eye Contact Fair  Facial Expression Flat  Affect Appropriate to circumstance  Speech Slurred  Interaction Assertive  Motor Activity Unsteady  Appearance/Hygiene In scrubs  Behavior Characteristics Calm  Mood Pleasant  Thought Process  Coherency Circumstantial  Content WDL  Delusions None reported or observed  Perception WDL  Hallucination None reported or observed  Judgment Impaired  Confusion Mild  Danger to Self  Current suicidal ideation? Denies  Danger to Others  Danger to Others None reported or observed

## 2022-05-20 NOTE — [facility] Group Notes (Signed)
[facility] Group Notes:  (Nursing/MHT/Case Management/Adjunct)  Date:  05/20/2022  Time:  10:28 AM  Type of Therapy:   Meditation therapy  Participation Level:  Active  Participation Quality:  Appropriate  Affect:  Appropriate  Cognitive:  Appropriate  Insight:  Appropriate  Engagement in Group:  Engaged  Modes of Intervention:  Activity  Summary of Progress/Problems:  [name] 05/20/2022, 10:28 AM

## 2022-05-20 NOTE — [facility] Counselor (Signed)
CSW attempted to contact the patient's legal guardian.  CSW has sent email and attempted phone call.   CSW left HIPAA compliant voicemail.  At the time of this note, no response has been provided.   [name], MSW, LCSW 05/20/2022 2:24 PM

## 2022-05-20 NOTE — Progress Notes (Signed)
Patient resting in bed with closed eyes.  Respirations even / unlabored.  He is A/O x3, reoriented to date / time.  He was not sure of the exact date because he feels like his days are running together.  He watched TV in the dayroom, and socialized with other patients.   He is concerned that he has been unable to speak with his Guardian.  He would like to be discharged, and is not sure why he can't leave.  This Clinical research associate will email Michaela, LCSW, regarding a possible open bed.  No s/sx. of acute distress.  He is cooperative and pleasant.  No adverse reactions reported or observed.    Plan of Care remains in place.  Q61m safety checks are being completed/

## 2022-05-20 NOTE — [facility] Group Notes (Signed)
[facility] Group Notes:  (Nursing/MHT/Case Management/Adjunct)  Date:  05/20/2022  Time:  1:09 PM  Type of Therapy:  Movement Therapy  Participation Level:  Active  Participation Quality:  Appropriate  Affect:  Appropriate  Cognitive:  Appropriate  Insight:  Appropriate  Engagement in Group:  Engaged  Modes of Intervention:  Activity  Summary of Progress/Problems:  [name] 05/20/2022, 1:09 PM

## 2022-05-20 NOTE — Plan of Care (Signed)
  Problem: Education: Goal: Knowledge of General Education information will improve Description: Including pain rating scale, medication(s)/side effects and non-pharmacologic comfort measures Outcome: Progressing   Problem: Health Behavior/Discharge Planning: Goal: Ability to manage health-related needs will improve Outcome: Progressing   Problem: Clinical Measurements: Goal: Ability to maintain clinical measurements within normal limits will improve Outcome: Progressing Goal: Will remain free from infection Outcome: Progressing Goal: Diagnostic test results will improve Outcome: Progressing Goal: Respiratory complications will improve Outcome: Progressing Goal: Cardiovascular complication will be avoided Outcome: Progressing   Problem: Activity: Goal: Risk for activity intolerance will decrease Outcome: Progressing   Problem: Coping: Goal: Level of anxiety will decrease Outcome: Progressing   Problem: Elimination: Goal: Will not experience complications related to bowel motility Outcome: Progressing Goal: Will not experience complications related to urinary retention Outcome: Progressing   Problem: Self-Concept: Goal: Ability to identify factors that promote anxiety will improve Outcome: Progressing Goal: Level of anxiety will decrease Outcome: Progressing Goal: Ability to modify response to factors that promote anxiety will improve Outcome: Progressing   Problem: Coping: Goal: Ability to identify and develop effective coping behavior will improve Outcome: Progressing

## 2022-05-20 NOTE — [facility] Counselor (Signed)
CSW has sent referral to additional facilities for consideration for placement.   CSW awaiting decision.  [name], MSW, LCSW 05/20/2022 3:52 PM
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
## 2022-05-21 DIAGNOSIS — F250 Schizoaffective disorder, bipolar type: Secondary | ICD-10-CM | POA: Diagnosis not present

## 2022-05-21 NOTE — Group Note (Signed)
Recreation Therapy Group Note   Group Topic:Relaxation  Group Date: 05/21/2022 Start Time: 1400 End Time: 1450 Facilitators: Rosina Lowenstein, LRT, CTRS Location:  Dayroom and Courtyard   Group Description: PMR (Progressive Muscle Relaxation). LRT asks patients their current level of stress/anxiety from 1-10, with 10 being the highest. LRT educates patients on what PMR is and the benefits that come from it. Patients are asked to sit with their feet flat on the floor while sitting up and all the way back in their chair, if possible. LRT and pts follow a prompt through a speaker that requires you to tense and release different muscles in their body and focus on their breathing. During session, lights are off and soft music is being played. At the end of the prompt, LRT asks patients to rank their current levels of stress/anxiety from 1-10, 10 being the highest.   Goal Area(s) Addressed:  Patients will be able to describe progressive muscle relaxation.  Patient will practice using relaxation technique. Patient will identify a new coping skill.  Patient will follow multistep directions to reduce anxiety and stress.  Affect/Mood: Appropriate   Participation Level: Non-verbal, Active, and Engaged   Participation Quality: Independent   Behavior: Appropriate, Calm, and Cooperative   Speech/Thought Process: Coherent   Insight: Good   Judgement: Good   Modes of Intervention: Activity   Patient Response to Interventions:  Attentive, Engaged, Interested , and Receptive   Education Outcome:  Acknowledges education   Clinical Observations/Individualized Feedback: [name] was active in their participation of session activities and group discussion. Pt identified that his stress and anxiety levels were a 4 or 5 before the session. After the session, he shared that his stress and anxiety levels were a 1. After the PMR session, LRT and peers went outside for fresh air and sunlight. Pt interacted  well with LRT and peers duration of group.    Plan: Continue to engage patient in RT group sessions 2-3x/week.   Rosina Lowenstein, LRT, CTRS 05/21/2022 2:57 PM

## 2022-05-21 NOTE — Progress Notes (Signed)
   05/21/22 2100  Psych Admission Type (Psych Patients Only)  Admission Status Voluntary  Psychosocial Assessment  Patient Complaints None  Eye Contact Fair  Facial Expression Animated  Affect Appropriate to circumstance  Speech Slurred  Interaction Assertive  Motor Activity Unsteady  Appearance/Hygiene In scrubs  Behavior Characteristics Cooperative;Calm  Mood Pleasant  Thought Process  Coherency Circumstantial  Content WDL  Delusions None reported or observed  Perception WDL  Hallucination None reported or observed  Judgment Impaired  Confusion Mild  Danger to Self  Current suicidal ideation? Denies  Danger to Others  Danger to Others None reported or observed

## 2022-05-21 NOTE — Group Note (Signed)
Date:  05/21/2022 Time:  10:47 AM  Group Topic/Focus:  Managing Feelings:   The focus of this group is to identify what feelings patients have difficulty handling and develop a plan to handle them in a healthier way upon discharge.    Participation Level:  Active  Participation Quality:  Appropriate  Affect:  Appropriate  Cognitive:  Appropriate and Oriented  Insight: Good  Engagement in Group:  Engaged  Modes of Intervention:  Discussion and Problem-solving  Additional Comments:    Leonie Green 05/21/2022, 10:47 AM
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
## 2022-05-21 NOTE — BHH Counselor (Signed)
CSW has sent referral information to Marlene at [facility].  CSW waiting for review and follow up.  Sharronda Schweers, MSW, LCSW 05/21/2022 2:48 PM  

## 2022-05-21 NOTE — BHH Counselor (Signed)
CSW attempted to reach [facility] to check on progress in identifying a group home for this patient.  CSW left a HIPAA compliant voicemail requesting a return call.  [name], MSW, LCSW 05/21/2022 2:05 PM

## 2022-05-21 NOTE — BHH Counselor (Signed)
CSW followed up with Selena Batten at [facility] on placement.  She reports that she has concerns for patient being noncompliant.  She requests additional clinical information.   CSW has sent the information.  [name], MSW, LCSW 05/21/2022 2:16 PM

## 2022-05-21 NOTE — Progress Notes (Signed)
   05/21/22 0800  Psych Admission Type (Psych Patients Only)  Admission Status Voluntary  Psychosocial Assessment  Patient Complaints Sleep disturbance  Eye Contact Fair  Facial Expression Flat  Affect Appropriate to circumstance  Speech Slurred  Interaction Assertive  Motor Activity Unsteady  Appearance/Hygiene In scrubs  Behavior Characteristics Calm;Combative  Mood Pleasant  Thought Process  Coherency Circumstantial  Content WDL  Delusions None reported or observed  Perception WDL  Hallucination None reported or observed  Judgment Impaired  Confusion Mild  Danger to Self  Current suicidal ideation? Denies  Danger to Others  Danger to Others None reported or observed

## 2022-05-21 NOTE — Plan of Care (Signed)

## 2022-05-21 NOTE — BHH Counselor (Addendum)
CSW spoke with the patient's guardian Wilhemina Cash, [facility], [name] [phone].  She reports that she has been at a guardianship conference and will be unavailable until Monday.   CSW inquired about progress towards identifying placement for the patient.  CSW further inquired if there was someone working on the patient's placement in the guardian's absence.  [name] reports "as a guardian my role is to remain in the guardianship role".  She further states that she relies "on case managers and social workers" to identify placement.  She reports that she relies on the hospital to find placement and is requesting that CSW "have a case manager assist".  CSW explained that there are no case managers to assist and expectation is that the guardian identify housing with some assistance from CSW.  [name] requested that CSW follow up with [place], owner of [facility].  CSW did contact Cassandra on the crisis line, [phone].  She reports that guardian is to assist.  She reports that she will need to review the case and call this CSW back.  She requested that CSW continue to locate placement.   CSW updated that she has contacted ALFs through the hub, [facility], [facility] and [facility].  CSW reported that patient has been declined and under review respectively for the individual care home.  [name], MSW, LCSW 05/21/2022 3:34 PM

## 2022-05-21 NOTE — Group Note (Signed)
[facility] LCSW Group Therapy Note   Group Date: 05/21/2022 Start Time: 1300 End Time: 1400   Type of Therapy/Topic:  Group Therapy:  Emotion Regulation  Participation Level:  Active   Mood:  Description of Group:    The purpose of this group is to assist patients in learning to regulate negative emotions and experience positive emotions. Patients will be guided to discuss ways in which they have been vulnerable to their negative emotions. These vulnerabilities will be juxtaposed with experiences of positive emotions or situations, and patients challenged to use positive emotions to combat negative ones. Special emphasis will be placed on coping with negative emotions in conflict situations, and patients will process healthy conflict resolution skills.  Therapeutic Goals: Patient will identify two positive emotions or experiences to reflect on in order to balance out negative emotions:  Patient will label two or more emotions that they find the most difficult to experience:  Patient will be able to demonstrate positive conflict resolution skills through discussion or role plays:   Summary of Patient Progress: Patient was present in group.  Patient was an active participant in group. Patient was supportive of other group members.  Patient shred that when he thinks of emotion regulation he  thinks "keep check on your behaviors".  He reports that he has felt "happiness, frustration, tension" since being admitted to the hospital .  He was able to identify that he is aware of what emotions he is feeling because "the way my mind feels my body".  He was able to identify coping skills and how he can engage in them when not on the unit.   Therapeutic Modalities:   Cognitive Behavioral Therapy Feelings Identification Dialectical Behavioral Therapy   Harden Mo, LCSW

## 2022-05-21 NOTE — Progress Notes (Signed)
D: Patient alert and oriented times 3. Pt denies SI, HI, AVH. Pt denies pain. He also denies anxiety and depression. No other concerns noted at this time.   A: Pt provided support and encouragement throughout the day. Scheduled medications given as prescribed. Takes medications whole without issue.    R: Pt remains safe on the unit with Q15 min safety checks in place. Will continue to monitor for changes.

## 2022-05-21 NOTE — Progress Notes (Signed)
   05/21/22 0557  15 Minute Checks  Location Bedroom  Visual Appearance Calm  Behavior Sleeping  Sleep (Behavioral Health Patients Only)  Calculate sleep? (Click Yes once per 24 hr at 0600 safety check) Yes  Documented sleep last 24 hours 9.75

## 2022-05-22 DIAGNOSIS — F250 Schizoaffective disorder, bipolar type: Secondary | ICD-10-CM | POA: Diagnosis not present

## 2022-05-22 NOTE — BHH Counselor (Signed)
CSW has sent referral information to [place] with [facility].  [name], MSW, LCSW 05/22/2022 11:35 AM

## 2022-05-22 NOTE — Progress Notes (Signed)
Patient calm and cooperative.  Denies SI, HI and AVH.  Denies anxiety and depression.  Patient asking staff for any updates on his placement. Patient states he is ready to leave.  Compliant with scheduled medications.  15 min checks in place for safety. Patient is present in the milieu.  Appropriate interaction with peers.   Patient was excited after interviewing with a group home.

## 2022-05-22 NOTE — Progress Notes (Addendum)
[facility] LCSW Progress Note   05/22/2022    3:29 PM  MONTEGO SEEDORF   [id number]   Type of Contact and Topic:  Care Coordination   CSW coordinated meeting with Malachi Bonds (group home owner) and Cyndie Mull (admissions coordinator) with patient for admissions consideration. In general, patient did well in the interview. Malachi Bonds and Imai to discuss internally whether the patient is appropriate for placement. CSW to followup on Friday 05/23/22 regarding admissions decision. Situation ongoing, CSW will continue to monitor and update note as more information becomes available.       Signed:  Corky Crafts, MSW, LCSW, LCAS 05/22/2022 3:29 PM

## 2022-05-22 NOTE — Plan of Care (Signed)

## 2022-05-22 NOTE — BHH Counselor (Signed)
CSW assisted patient in attempting to contact Elonda Husky co owner of [facility], [phone] .  CSW was informed that Elonda Husky was at court at [facility] and left message for her to call this CSW back.    [name], MSW, LCSW 05/22/2022 11:20 AM

## 2022-05-22 NOTE — Progress Notes (Signed)
   05/22/22 0554  15 Minute Checks  Location Bedroom  Visual Appearance N/A patient off unit  Behavior Composed  Sleep (Behavioral Health Patients Only)  Calculate sleep? (Click Yes once per 24 hr at 0600 safety check) Yes  Documented sleep last 24 hours 8

## 2022-05-22 NOTE — Group Note (Signed)
[facility] LCSW Group Therapy Note    Group Date: 05/22/2022 Start Time: 1000 End Time: 1100  Type of Therapy and Topic:  Group Therapy:  Overcoming Obstacles  Participation Level:  B[facility] PARTICIPATION LEVEL: Active  Mood:  Description of Group:   In this group patients will be encouraged to explore what they see as obstacles to their own wellness and recovery. They will be guided to discuss their thoughts, feelings, and behaviors related to these obstacles. The group will process together ways to cope with barriers, with attention given to specific choices patients can make. Each patient will be challenged to identify changes they are motivated to make in order to overcome their obstacles. This group will be process-oriented, with patients participating in exploration of their own experiences as well as giving and receiving support and challenge from other group members.  Therapeutic Goals: 1. Patient will identify personal and current obstacles as they relate to admission. 2. Patient will identify barriers that currently interfere with their wellness or overcoming obstacles.  3. Patient will identify feelings, thought process and behaviors related to these barriers. 4. Patient will identify two changes they are willing to make to overcome these obstacles:    Summary of Patient Progress Patient was present in group.  Patient was an active participant in group.  Patient shared that he feels frustrated at the housing process.  He stated that he was frustrated then became tangential and began discussing [name], being adopted "by a white person", and [employer].  Patient did return to topic and stated that he planned on fasting and praying to address the housing issue.  CSW encouraged patient to eat, however, follow his religious practices as he sees fit.  Patient was appropriate and engaged in discussion following this.    Therapeutic Modalities:   Cognitive Behavioral Therapy Solution Focused Therapy Motivational Interviewing Relapse Prevention Therapy   Harden Mo, LCSW

## 2022-05-22 NOTE — Group Note (Signed)
Date:  05/22/2022 Time:  5:51 PM  Group Topic/Focus:  MUSIC RELAXATION    Participation Level:  Active  Participation Quality:  Appropriate  Affect:  Appropriate and Excited  Cognitive:  Alert  Insight: Good  Engagement in Group:  Engaged  Modes of Intervention:  Socialization  Additional Comments:    [employer] 05/22/2022, 5:51 PM
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
## 2022-05-22 NOTE — Group Note (Signed)
Recreation Therapy Group Note   Group Topic:Communication  Group Date: 05/22/2022 Start Time: 1400 End Time: 1445 Facilitators: Rosina Lowenstein, LRT, CTRS Location: Courtyard  Group Description: Reminisce Cards. Patients drew a laminated card out of a bag that had a word or phrase on it. Pt encouraged to speak about a time in their life or fond memory that specifically relates to the word they chose out of the bag. An example would be: "parenthood, meals, siblings, travel, or home".  LRT prompted following questions and encouraged contribution from peers to increase communication.    Goal Area(s) Addressed: Patient will increase verbal communication by conversing with peers. Patient will contribute to group discussion with minimal prompting. Patient will reminisce a positive memory or moment in their life.   Affect/Mood: Appropriate and Happy   Participation Level: Active and Engaged   Participation Quality: Independent   Behavior: Appropriate, Calm, and Cooperative   Speech/Thought Process: Coherent   Insight: Good   Judgement: Good   Modes of Intervention: Guided Discussion   Patient Response to Interventions:  Attentive, Engaged, Interested , and Receptive   Education Outcome:  Acknowledges education   Clinical Observations/Individualized Feedback: [name] was active in their participation of session activities and group discussion. Pt identified "I had a german sheppard puppy before named Lady. She was a great dog and ended up having puppies. 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 Pt interacted well with peers and LRT duration of session.    Plan: Continue to engage patient in RT group sessions 2-3x/week.   Rosina Lowenstein, LRT, CTRS 05/22/2022 2:54 PM

## 2022-05-22 NOTE — BH IP Treatment Plan (Signed)
Interdisciplinary Treatment and Diagnostic Plan Update  05/22/2022 Time of Session: 10:00AM [name] MRN: [id number]  Principal Diagnosis: Schizoaffective disorder, bipolar type [facility])  Secondary Diagnoses: Principal Problem:   Schizoaffective disorder, bipolar type (HCC)   Current Medications:  Current Facility-Administered Medications  Medication Dose Route Frequency Provider Last Rate Last Admin   acetaminophen (TYLENOL) tablet 650 mg  650 mg Oral Q6H PRN Bennett, Christal H, NP   650 mg at 05/07/22 0832   alum & mag hydroxide-simeth (MAALOX/MYLANTA) 200-200-20 MG/5ML suspension 30 mL  30 mL Oral Q4H PRN Bennett, Christal H, NP       amantadine (SYMMETREL) capsule 100 mg  100 mg Oral BID Bennett, Christal H, NP   100 mg at 05/22/22 0943   amLODipine (NORVASC) tablet 5 mg  5 mg Oral Daily Bennett, Christal H, NP   5 mg at 05/22/22 0943   atorvastatin (LIPITOR) tablet 40 mg  40 mg Oral QHS Bennett, Christal H, NP   40 mg at 05/21/22 2129   carbamazepine (TEGRETOL XR) 12 hr tablet 400 mg  400 mg Oral QHS Sarina Ill, DO   400 mg at 05/21/22 2129   chlorproMAZINE (THORAZINE) tablet 50 mg  50 mg Oral Q6H PRN Sarina Ill, DO       fluticasone furoate-vilanterol (BREO ELLIPTA) 200-25 MCG/ACT 1 puff  1 puff Inhalation Daily Bennett, Christal H, NP   1 puff at 05/22/22 0942   LORazepam (ATIVAN) tablet 2 mg  2 mg Oral TID PRN Thurston Hole H, NP   2 mg at 05/11/22 2326   Or   LORazepam (ATIVAN) injection 2 mg  2 mg Intramuscular TID PRN Willeen Cass, Christal H, NP   2 mg at 05/09/22 1031   losartan (COZAAR) tablet 100 mg  100 mg Oral Daily Sarina Ill, DO   100 mg at 05/22/22 7829   magnesium hydroxide (MILK OF MAGNESIA) suspension 30 mL  30 mL Oral Daily PRN Bennett, Christal H, NP       nicotine (NICODERM CQ - dosed in mg/24 hours) patch 14 mg  14 mg Transdermal Daily Sarina Ill, DO   14 mg at 05/22/22 0943   risperiDONE (RISPERDAL)  tablet 2 mg  2 mg Oral BH-q8a4p Sarina Ill, DO   2 mg at 05/22/22 5621   PTA Medications: Medications Prior to Admission  Medication Sig Dispense Refill Last Dose   amantadine (SYMMETREL) 100 MG capsule Take 100 mg by mouth 2 (two) times daily.   05/06/2022   atorvastatin (LIPITOR) 40 MG tablet Take 1 tablet (40 mg total) by mouth at bedtime. 30 tablet 1 05/06/2022   lithium carbonate (LITHOBID) 300 MG CR tablet Take 1 tablet (300 mg total) by mouth 3 (three) times daily. (Patient taking differently: Take 600 mg by mouth at bedtime.) 90 tablet 1 05/06/2022   amLODipine (NORVASC) 5 MG tablet Take 1 tablet (5 mg total) by mouth daily. 30 tablet 1 Unknown   fluticasone furoate-vilanterol (BREO ELLIPTA) 200-25 MCG/ACT AEPB Inhale 1 puff into the lungs daily. 1 each 1 Unknown   losartan (COZAAR) 50 MG tablet Take 1 tablet (50 mg total) by mouth daily. 30 tablet 1 Unknown   OLANZapine (ZYPREXA) 10 MG tablet Take 1 tablet (10 mg total) by mouth daily. (Patient not taking: Reported on 05/02/2022) 30 tablet 1 Unknown   OLANZapine-Samidorphan (LYBALVI) 10-10 MG TABS Take 1 tablet by mouth daily.   Unknown   QUEtiapine (SEROQUEL) 200 MG tablet Take 1 tablet (  200 mg total) by mouth at bedtime. (Patient not taking: Reported on 05/02/2022) 30 tablet 1 Unknown   risperiDONE (RISPERDAL M-TABS) 1 MG disintegrating tablet Take 1 tablet (1 mg total) by mouth 2 (two) times daily as needed (agitation). 60 tablet 1 Unknown    Patient Stressors: Marital or family conflict   Traumatic event    Patient Strengths: Ability for insight  Motivation for treatment/growth   Treatment Modalities: Medication Management, Group therapy, Case management,  1 to 1 session with clinician, Psychoeducation, Recreational therapy.   Physician Treatment Plan for Primary Diagnosis: Schizoaffective disorder, bipolar type (HCC) Long Term Goal(s): Improvement in symptoms so as ready for discharge   Short Term Goals: Ability to  identify changes in lifestyle to reduce recurrence of condition will improve Ability to verbalize feelings will improve Ability to disclose and discuss suicidal ideas Ability to demonstrate self-control will improve Ability to identify and develop effective coping behaviors will improve Ability to maintain clinical measurements within normal limits will improve Compliance with prescribed medications will improve Ability to identify triggers associated with substance abuse/mental health issues will improve  Medication Management: Evaluate patient's response, side effects, and tolerance of medication regimen.  Therapeutic Interventions: 1 to 1 sessions, Unit Group sessions and Medication administration.  Evaluation of Outcomes: Progressing  Physician Treatment Plan for Secondary Diagnosis: Principal Problem:   Schizoaffective disorder, bipolar type (HCC)  Long Term Goal(s): Improvement in symptoms so as ready for discharge   Short Term Goals: Ability to identify changes in lifestyle to reduce recurrence of condition will improve Ability to verbalize feelings will improve Ability to disclose and discuss suicidal ideas Ability to demonstrate self-control will improve Ability to identify and develop effective coping behaviors will improve Ability to maintain clinical measurements within normal limits will improve Compliance with prescribed medications will improve Ability to identify triggers associated with substance abuse/mental health issues will improve     Medication Management: Evaluate patient's response, side effects, and tolerance of medication regimen.  Therapeutic Interventions: 1 to 1 sessions, Unit Group sessions and Medication administration.  Evaluation of Outcomes: Progressing   RN Treatment Plan for Primary Diagnosis: Schizoaffective disorder, bipolar type (HCC) Long Term Goal(s): Knowledge of disease and therapeutic regimen to maintain health will improve  Short  Term Goals: Ability to demonstrate self-control, Ability to participate in decision making will improve, Ability to verbalize feelings will improve, Ability to disclose and discuss suicidal ideas, Ability to identify and develop effective coping behaviors will improve, and Compliance with prescribed medications will improve  Medication Management: RN will administer medications as ordered by provider, will assess and evaluate patient's response and provide education to patient for prescribed medication. RN will report any adverse and/or side effects to prescribing provider.  Therapeutic Interventions: 1 on 1 counseling sessions, Psychoeducation, Medication administration, Evaluate responses to treatment, Monitor vital signs and CBGs as ordered, Perform/monitor CIWA, COWS, AIMS and Fall Risk screenings as ordered, Perform wound care treatments as ordered.  Evaluation of Outcomes: Progressing   LCSW Treatment Plan for Primary Diagnosis: Schizoaffective disorder, bipolar type (HCC) Long Term Goal(s): Safe transition to appropriate next level of care at discharge, Engage patient in therapeutic group addressing interpersonal concerns.  Short Term Goals: Engage patient in aftercare planning with referrals and resources, Increase social support, Increase ability to appropriately verbalize feelings, Increase emotional regulation, Facilitate acceptance of mental health diagnosis and concerns, and Increase skills for wellness and recovery  Therapeutic Interventions: Assess for all discharge needs, 1 to 1 time with Child psychotherapist,  Explore available resources and support systems, Assess for adequacy in community support network, Educate family and significant other(s) on suicide prevention, Complete Psychosocial Assessment, Interpersonal group therapy.  Evaluation of Outcomes: Progressing   Progress in Treatment: Attending groups: Yes. Participating in groups: Yes. Taking medication as prescribed:  Yes. Toleration medication: Yes. Family/Significant other contact made: Yes, individual(s) contacted:  SPE completed with the patient's guardian.  Patient understands diagnosis: Yes. Discussing patient identified problems/goals with staff: Yes. Medical problems stabilized or resolved: Yes. Denies suicidal/homicidal ideation: Yes. Issues/concerns per patient self-inventory: Yes. Other: none  New problem(s) identified: No, Describe:  none Update 05/12/2022:  No changes at this time. Update 05/17/22: No changes at this time.  Update 05/22/2022:  No changes at this time.   New Short Term/Long Term Goal(s): elimination of symptoms of psychosis, medication management for mood stabilization; elimination of SI thoughts; development of comprehensive mental wellness/sobriety plan. Update 05/12/2022:  No changes at this time Update 05/17/22: No changes at this time.  Update 05/22/2022:  No changes at this time.   Patient Goals:  Patient unable to participate in treatment team.  Patient was asleep following administration of medication during agitation protocol. Update 05/12/2022:  Patient remains safe on the unit at this time.  Patient continues to clear up as time progresses.  Patient, however, is still not at baseline.  Patient able to return to his group home, however, will have to leave as he has been served a notice.  Update 05/17/22: More clear and able to have conversations with you.  Update 05/22/2022:  Patient is ready for discharge.  Guardian has not identified a appropriate placement for patient.  CSW has referred patient out but not received any acceptance.      Discharge Plan or Barriers: CSW to assist with discharge plans.  Update 05/12/2022:  No changes at this time.  Update 05/17/22: Patient remains a placement concern. Guardian is working to identify possible group home placement.  Update 05/22/2022:  No changes at this time.  Last 3 [place] Suicide Severity Risk Score: Flowsheet Row Admission (Current)  from 05/06/2022 in [facility] BE[facility]AVIORAL [facility] ED from 05/02/2022 in [facility] Emergency Department at [facility] Admission (Discharged) from 05/02/2021 in [facility] INPATIENT BE[facility]IORAL MEDICINE  C-SSRS RISK CATEGORY No Risk No Risk No Risk       Last PHQ 2/9 Scores:     No data to display          Scribe for Treatment Team: Harden Mo, Alexander Mt 05/22/2022 10:35 AM

## 2022-05-23 DIAGNOSIS — F250 Schizoaffective disorder, bipolar type: Secondary | ICD-10-CM | POA: Diagnosis not present

## 2022-05-23 MED ORDER — TRAZODONE HCL 100 MG PO TABS
100.0000 mg | ORAL_TABLET | Freq: Every evening | ORAL | Status: DC | PRN
  Administered 2022-05-24 – 2022-06-04 (×5): 100 mg via ORAL
  Filled 2022-05-23 (×6): qty 1
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
## 2022-05-23 NOTE — Group Note (Signed)
LCSW Group Therapy Note   Group Date: 05/23/2022 Start Time: 1300 End Time: 1350   Type of Therapy and Topic:  Group Therapy: Boundaries  Participation Level:  Active  Description of Group: This group will address the use of boundaries in their personal lives. Patients will explore why boundaries are important, the difference between healthy and unhealthy boundaries, and negative and postive outcomes of different boundaries and will look at how boundaries can be crossed.  Patients will be encouraged to identify current boundaries in their own lives and identify what kind of boundary is being set. Facilitators will guide patients in utilizing problem-solving interventions to address and correct types boundaries being used and to address when no boundary is being used. Understanding and applying boundaries will be explored and addressed for obtaining and maintaining a balanced life. Patients will be encouraged to explore ways to assertively make their boundaries and needs known to significant others in their lives, using other group members and facilitator for role play, support, and feedback.  Therapeutic Goals:  1.  Patient will identify areas in their life where setting clear boundaries could be  used to improve their life.  2.  Patient will identify signs/triggers that a boundary is not being respected. 3.  Patient will identify two ways to set boundaries in order to achieve balance in  their lives: 4.  Patient will demonstrate ability to communicate their needs and set boundaries  through discussion and/or role plays  Summary of Patient Progress:   Patient  was present and active throughout the session and proved open to feedback from CSW and peers. Patient demonstrated poor insight into the subject matter, was respectful of peers, and was present throughout the entire session.  Patient was tangential and off topic at times, however was able to be redirected.   Therapeutic Modalities:    Cognitive Behavioral Therapy Solution-Focused Therapy  Harden Mo, LCSWA 05/23/2022  1:56 PM

## 2022-05-23 NOTE — Group Note (Signed)
Date:  05/23/2022 Time:  10:09 AM  Group Topic/Focus:  Open Craft and Music Therapy. Patients used art supplies to color while listening to soothing music.      Participation Level:  Active  Participation Quality:  Appropriate  Affect:  Appropriate  Cognitive:  Alert and Appropriate  Insight: Appropriate  Engagement in Group:  Engaged  Modes of Intervention:  Activity  Additional Comments:    Marta Antu 05/23/2022, 10:09 AM

## 2022-05-23 NOTE — [facility] Counselor (Signed)
CSW attempted to contact the Malachi Bonds for [facility] to assess after the interview.  CSW will attempt to follow up later.  [name], MSW, LCSW 05/23/2022 11:01 AM

## 2022-05-23 NOTE — Plan of Care (Signed)
Calm and cooperative. Visible on the unit. States he's ready to leave. Interacting with peers and staff. Po med compliant. No PRNs given. Support and encouragement provided. No behavior issues noted. Denies SI/HI/AV/H. No c/o pain/discomfort noted. Plan of care continued.   Problem: Activity: Goal: Risk for activity intolerance will decrease Outcome: Progressing   Problem: Nutrition: Goal: Adequate nutrition will be maintained Outcome: Progressing   Problem: Coping: Goal: Level of anxiety will decrease Outcome: Progressing   Problem: Elimination: Goal: Will not experience complications related to bowel motility Outcome: Progressing   Problem: Elimination: Goal: Will not experience complications related to urinary retention Outcome: Progressing

## 2022-05-23 NOTE — Plan of Care (Signed)
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
## 2022-05-23 NOTE — Group Note (Signed)
Date:  05/23/2022 Time:  11:51 PM  Group Topic/Focus:  Building Self Esteem:   The Focus of this group is helping patients become aware of the effects of self-esteem on their lives, the things they and others do that enhance or undermine their self-esteem, seeing the relationship between their level of self-esteem and the choices they make and learning ways to enhance self-esteem. Coping With Mental Health Crisis:   The purpose of this group is to help patients identify strategies for coping with mental health crisis.  Group discusses possible causes of crisis and ways to manage them effectively. Developing a Wellness Toolbox:   The focus of this group is to help patients develop a "wellness toolbox" with skills and strategies to promote recovery upon discharge. Early Warning Signs:   The focus of this group is to help patients identify signs or symptoms they exhibit before slipping into an unhealthy state or crisis. Emotional Education:   The focus of this group is to discuss what feelings/emotions are, and how they are experienced. Goals Group:   The focus of this group is to help patients establish daily goals to achieve during treatment and discuss how the patient can incorporate goal setting into their daily lives to aide in recovery. Healthy Communication:   The focus of this group is to discuss communication, barriers to communication, as well as healthy ways to communicate with others. Making Healthy Choices:   The focus of this group is to help patients identify negative/unhealthy choices they were using prior to admission and identify positive/healthier coping strategies to replace them upon discharge. Personal Choices and Values:   The focus of this group is to help patients assess and explore the importance of values in their lives, how their values affect their decisions, how they express their values and what opposes their expression. Recovery Goals:   The focus of this group is to identify  appropriate goals for recovery and establish a plan to achieve them. Relapse Prevention Planning:   The focus of this group is to define relapse and discuss the need for planning to combat relapse. Self Care:   The focus of this group is to help patients understand the importance of self-care in order to improve or restore emotional, physical, spiritual, interpersonal, and financial health. Self Esteem Action Plan:   The focus of this group is to help patients create a plan to continue to build self-esteem after discharge. Spirituality:   The focus of this group is to discuss how one's spirituality can aide in recovery.    Participation Level:  Active  Participation Quality:  Appropriate  Affect:  Appropriate  Cognitive:  Appropriate  Insight: Appropriate  Engagement in Group:  Developing/Improving and Engaged  Modes of Intervention:  Discussion  Additional Comments:    Lenore Cordia 05/23/2022, 11:51 PM

## 2022-05-23 NOTE — Plan of Care (Signed)
Patient has been active in the milieu, pleasant and cooperative., Frequently asking when he can leave "I  really need to get out of here and go to smoke some".  Patient reported no additional concerns. He ate his lunch and he is able to express his needs and concerns. Staff continue to provide support and encouragements. Safety monitored per unit protocol.

## 2022-05-23 NOTE — Progress Notes (Signed)
Patient has stayed in the milieu, pleasant and cooperative. He participated in different activities and maintained a positive attitude. His thought process is clear.  He was able ask for clarifications about his medications and verbalized understanding upon education.  Patient ate his meals and took all his scheduled medications. He is alert and oriented and denying SI/HI/AVH. Has no sign of distress. Encouragements and support provided.

## 2022-05-24 DIAGNOSIS — F250 Schizoaffective disorder, bipolar type: Secondary | ICD-10-CM | POA: Diagnosis not present

## 2022-05-24 NOTE — Plan of Care (Signed)
  Problem: Nutrition: Goal: Adequate nutrition will be maintained Outcome: Progressing   Problem: Safety: Goal: Ability to remain free from injury will improve Outcome: Progressing   Problem: Skin Integrity: Goal: Risk for impaired skin integrity will decrease Outcome: Progressing   

## 2022-05-24 NOTE — Group Note (Signed)
Date:  05/24/2022 Time:  10:56 AM  Group Topic/Focus:  Self Care:   The focus of this group is to help patients understand the importance of self-care in order to improve or restore emotional, physical, spiritual, interpersonal, and financial health. Self Esteem Action Plan:   The focus of this group is to help patients create a plan to continue to build self-esteem after discharge.    Participation Level:  Active  Participation Quality:  Appropriate  Affect:  Excited  Cognitive:  Appropriate  Insight: Good  Engagement in Group:  Engaged  Modes of Intervention:  Discussion and Education  Additional Comments:     Veron Senner L Kristalynn Coddington 05/24/2022, 10:56 AM

## 2022-05-24 NOTE — Group Note (Signed)
Date:  05/24/2022 Time:  2:35 PM  Group Topic/Focus:  Rediscovering Joy:   The focus of this group is to explore various ways to relieve stress in a positive manner.    Participation Level:  Active  Participation Quality:  Appropriate  Affect:  Excited  Cognitive:  Appropriate  Insight: Good  Engagement in Group:  Engaged  Modes of Intervention:  Exploration and Socialization  Additional Comments:    Leonie Green 05/24/2022, 2:35 PM

## 2022-05-24 NOTE — Progress Notes (Signed)
[name] has remained pleasant and cooperative in the milieu. He had all his medications and ate his meals. He continues to request discharge information. Patient did not have  have any additional concerns.

## 2022-05-24 NOTE — Group Note (Signed)
Date:  05/24/2022 Time:  2:40 PM  Group Topic/Focus:  Healthy Communication:   The focus of this group is to discuss communication, barriers to communication, as well as healthy ways to communicate with others.    Participation Level:  Active  Participation Quality:  Appropriate  Affect:  Appropriate and Excited  Cognitive:  Appropriate and Confused  Insight: Good  Engagement in Group:  Engaged  Modes of Intervention:  Activity and Socialization  Additional Comments:    Leonie Green 05/24/2022, 2:40 PM

## 2022-05-24 NOTE — Plan of Care (Signed)
Calm and cooperative. Pleasant. Tangential. requesting to be discharged. Support and encouragement provided. Po Med compliant. No behavior issues noted. Denies SI/HI/AV/H. No c/o pain/discomfort noted. Plan of care continued.   Problem: Activity: Goal: Risk for activity intolerance will decrease Outcome: Progressing   Problem: Nutrition: Goal: Adequate nutrition will be maintained Outcome: Progressing   Problem: Coping: Goal: Level of anxiety will decrease Outcome: Progressing   Problem: Elimination: Goal: Will not experience complications related to bowel motility Outcome: Progressing   Problem: Elimination: Goal: Will not experience complications related to urinary retention Outcome: Progressing
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
## 2022-05-25 DIAGNOSIS — F250 Schizoaffective disorder, bipolar type: Secondary | ICD-10-CM | POA: Diagnosis not present

## 2022-05-25 NOTE — Plan of Care (Signed)
  Problem: Activity: Goal: Risk for activity intolerance will decrease Outcome: Progressing   Problem: Nutrition: Goal: Adequate nutrition will be maintained Outcome: Progressing   Problem: Coping: Goal: Level of anxiety will decrease Outcome: Not Progressing   
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
## 2022-05-25 NOTE — Group Note (Signed)
LCSW Group Therapy Note   Group Date: 05/25/2022 Start Time: 1307 End Time: 1352   Type of Therapy and Topic:  Group Therapy: Healthy VS. Unhealthy Coping Strategies  Participation Level:  Active    Summary of Patient Progress:  The patient attended group. The patient stated that his family dressed up for the brady bunch for Halloween. The patient stated that he uses alcohol to cope but he is going to quit. The patient stated that he uses sports and exercise to cope. Stating that he did 100 push up this morning.    Marshell Levan, LCSWA 05/25/2022  2:14 PM

## 2022-05-25 NOTE — Group Note (Signed)
Date:  05/25/2022 Time:  11:04 AM  Group Topic/Focus:  Rediscovering Joy:   The focus of this group is to explore various ways to relieve stress in a positive manner.     Participation Level:  Active  Participation Quality:  Appropriate  Affect:  Appropriate  Cognitive:  Alert and Appropriate  Insight: Appropriate  Engagement in Group:  Off Topic  Modes of Intervention:  Support  Additional Comments:    Berkley Harvey 05/25/2022, 11:04 AM

## 2022-05-25 NOTE — Plan of Care (Signed)
Pt is loud, irritable and agitated. Speech mumbled. Tangential with FOI. Focused on discharge. Delusional and grandiose.  Pt said "Lily Peer is my mother and I can live with her..." says he played basketball in high school and could've played for the [employer]. Support and encouragement provided. Thorazine 50 mg po and Trazodone 100 mg po given as ordered PRN, respectively. Tol well. Pt resting quietly in bed. Denies SI/HI/AV/H. No c/o pain/discomfort noted. Plan of care continued.    Problem: Activity: Goal: Risk for activity intolerance will decrease Outcome: Progressing   Problem: Nutrition: Goal: Adequate nutrition will be maintained Outcome: Progressing   Problem: Coping: Goal: Level of anxiety will decrease Outcome: Progressing   Problem: Safety: Goal: Ability to remain free from injury will improve Outcome: Progressing

## 2022-05-25 NOTE — Progress Notes (Signed)
Patient is alert and oriented. He denies SI/HI/AVH but remains preoccupied with thoughts of discharge and statements about possible places that he can live. Engaged in active listening.  No complaints or signs of distress noted.  Q15 minute unit checks in place.

## 2022-05-25 NOTE — Plan of Care (Signed)
PT denies SI / HI /AVH. Pt is confused, and at times frustrated. Pt is frequently at nurses station with requests to make phone calls or have social work make phone calls. No signs of distress or injury noted. Staff will continue q15 minute safety checks.   Problem: Education: Goal: Knowledge of General Education information will improve Description: Including pain rating scale, medication(s)/side effects and non-pharmacologic comfort measures Outcome: Not Progressing   Problem: Health Behavior/Discharge Planning: Goal: Ability to manage health-related needs will improve Outcome: Not Progressing   Problem: Clinical Measurements: Goal: Ability to maintain clinical measurements within normal limits will improve Outcome: Not Progressing

## 2022-05-25 NOTE — Group Note (Signed)
Date:  05/25/2022 Time:  10:04 PM  Group Topic/Focus:  Healthy Communication:   The focus of this group is to discuss communication, barriers to communication, as well as healthy ways to communicate with others. Today we discussed things we are grateful for   Participation Level:  Active  Participation Quality:  Appropriate  Affect:  Appropriate  Cognitive:  Alert  Insight: Appropriate  Engagement in Group:  Engaged  Modes of Intervention:  Discussion  Additional Comments:    [name] 05/25/2022, 10:04 PM

## 2022-05-26 DIAGNOSIS — F250 Schizoaffective disorder, bipolar type: Secondary | ICD-10-CM | POA: Diagnosis not present

## 2022-05-26 NOTE — Group Note (Signed)
Recreation Therapy Group Note   Group Topic:Communication  Group Date: 05/26/2022 Start Time: 1400 End Time: 1445 Facilitators: Rosina Lowenstein, LRT, CTRS Location: Courtyard  Group Description: Trivia. LRT reads off trivia question for patients to hear. Pts are given a set time limit to answer the question correctly. LRT waits for all patients to make a guess out loud. LRT facilitated post-game discussion on the importance of working well with others, active listening to others and being a part of a team. LRT and pts discussed how this can apply to life post-discharge.  Goal Area(s) Addressed: Patient will communicate with peers and LRT. Patient will recall a past historical fact. Patient will build on frustration tolerance skills.  Patient will practice active listening skills.  Affect/Mood: Appropriate   Participation Level: Active and Engaged   Participation Quality: Independent   Behavior: Cooperative and Preoccupied   Speech/Thought Process: Flight of ideas   Insight: Limited   Judgement: Fair    Modes of Intervention: Guided Discussion   Patient Response to Interventions:  Attentive, Engaged, and Interested    Education Outcome:  Acknowledges education   Clinical Observations/Individualized Feedback: [name] came to group late after her was taking a shower. Pt shared "[name] is my Dad and [name] is my Mom. When I leave here, I am going to get a bike and go up the highway. I'm leaving here today!" Pt was all over the place with conversation today and somewhat difficult to follow. Pt was calm and pleasant when interacting.   Plan: Continue to engage patient in RT group sessions 2-3x/week.   Rosina Lowenstein, LRT, CTRS 05/26/2022 2:58 PM

## 2022-05-26 NOTE — Progress Notes (Signed)
Patient is continuously at nurse's station wanting to speak with SW and asking about his discharge. Patient is fixated on having staff call "[place]."   Patient states he would like to go to [facility] and then begins speaking about a Hispanic doctor he spoke with yesterday. Speech is tangential and difficult to understand.

## 2022-05-26 NOTE — Progress Notes (Signed)
Pt awake in bed "I'm tired, I'm trying to rest right now. I feel dizzy and sleepy". Verbally redirectable at this time. Vitals rechecked, BP remains elevated but improved from initial reading. Pt noted to be asymptomatic. Safety maintained.

## 2022-05-26 NOTE — Plan of Care (Signed)
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
## 2022-05-26 NOTE — BHH Counselor (Signed)
CSW contacted [facility] to speak with Malachi Bonds. He was given contact information to follow up with her directly.   CSW contacted Malachi Bonds of [facility] and was informed that they only have individual living facility availability. Malachi Bonds went on to share that they have spoken with someone earlier today who informed them that this would not be an option for the pt as he needs 24hr supervision. CSW thanked her for her time. No other concerns expressed. Contact ended without incident.  Vilma Meckel. Algis Greenhouse, MSW, LCSW, LCAS 05/26/2022 2:41 PM
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
## 2022-05-27 DIAGNOSIS — F250 Schizoaffective disorder, bipolar type: Secondary | ICD-10-CM | POA: Diagnosis not present

## 2022-05-27 NOTE — Group Note (Signed)
[facility] LCSW Group Therapy Note   Group Date: 05/27/2022 Start Time: 1315 End Time: 1400   Type of Therapy and Topic: Group Therapy: Avoiding Self-Sabotaging and Enabling Behaviors  Participation Level: None  Mood:  Description of Group:  In this group, patients will learn how to identify obstacles, self-sabotaging and enabling behaviors, as well as: what are they, why do we do them and what needs these behaviors meet. Discuss unhealthy relationships and how to have positive healthy boundaries with those that sabotage and enable. Explore aspects of self-sabotage and enabling in yourself and how to limit these self-destructive behaviors in everyday life.   Therapeutic Goals: 1. Patient will identify one obstacle that relates to self-sabotage and enabling behaviors 2. Patient will identify one personal self-sabotaging or enabling behavior they did prior to admission 3. Patient will state a plan to change the above identified behavior 4. Patient will demonstrate ability to communicate their needs through discussion and/or role play.    Summary of Patient Progress: Patient present in group.  Patient did not engage in discussion and sat with his head on the table and hood on.  Patient asked about his group home search prior to group and appeared disheartened when CSW stated that we are still looking.   Therapeutic Modalities:  Cognitive Behavioral Therapy Person-Centered Therapy Motivational Interviewing    Harden Mo, LCSW

## 2022-05-27 NOTE — Progress Notes (Signed)
   05/27/22 0100  Psych Admission Type (Psych Patients Only)  Admission Status Voluntary  Psychosocial Assessment  Patient Complaints Anxiety;Agitation  Eye Contact Intense  Facial Expression Blank  Affect Irritable;Angry;Preoccupied  Speech Pressured;Rapid;Tangential  Interaction Demanding;Defensive  Motor Activity Fidgety;Restless;Pacing  Appearance/Hygiene Layered clothes  Behavior Characteristics Appropriate to situation  Mood Labile;Preoccupied  Aggressive Behavior  Effect No apparent injury  Thought Process  Coherency Tangential  Content Preoccupation  Delusions None reported or observed  Perception Hallucinations  Hallucination Auditory  Judgment Impaired  Confusion Mild  Danger to Self  Current suicidal ideation? Denies  Danger to Others  Danger to Others None reported or observed

## 2022-05-27 NOTE — Group Note (Signed)
Date:  05/27/2022 Time:  11:12 AM  Group Topic/Focus:  Customer service manager    Participation Level:  Active  Participation Quality:  Appropriate  Affect:  Appropriate  Cognitive:  Alert and Appropriate  Insight: Good  Engagement in Group:  Engaged  Modes of Intervention:  Activity, Discussion, and Socialization  Additional Comments:  Patients learned to work together by teaching card games to peers and staff. Boisvert, Amy T 05/27/2022, 11:12 AM

## 2022-05-27 NOTE — Progress Notes (Signed)
   05/27/22 2300  Psych Admission Type (Psych Patients Only)  Admission Status Voluntary  Psychosocial Assessment  Patient Complaints Anxiety  Eye Contact Fair  Facial Expression Flat  Affect Flat;Preoccupied  Speech Slurred  Interaction Assertive  Motor Activity Restless  Appearance/Hygiene In scrubs  Behavior Characteristics Cooperative;Anxious  Mood Anxious;Preoccupied  Thought Process  Coherency Tangential  Content Preoccupation  Delusions None reported or observed  Perception WDL  Hallucination None reported or observed  Judgment Impaired  Confusion Mild  Danger to Self  Current suicidal ideation? Denies  Danger to Others  Danger to Others None reported or observed

## 2022-05-27 NOTE — Group Note (Signed)
Recreation Therapy Group Note   Group Topic:Emotion Expression  Group Date: 05/27/2022 Start Time: 1400 End Time: 1455 Facilitators: Rosina Lowenstein, LRT, CTRS Location:  Dayroom and Courtyard   Group Description: Picture a Diplomatic Services operational officer. Patients and LRT discuss what it means to be "at peace", what it feels like physically and mentally. Pts are given a canvas and oil pastels to use and encouraged to draw their idea of a peaceful place. Pts and LRT discuss how they use this in their daily life post discharge. Pts are encouraged to take their canvas home with them as a reminder of their peaceful place whenever they are feeling depressed, anxious, etc. After completing their peaceful place, pts had the opportunity to go outside to the courtyard if they wanted.   Goal Area(s) Addressed:  Patient will identify what it means to experience a "peaceful" emotion. Patient will identify a new coping skill.  Patient will express their emotions through art. Patients will increase communication by talking with LRT and peers while in group.  Affect/Mood: Appropriate   Participation Level: Active and Engaged   Participation Quality: Independent   Behavior: Appropriate, Calm, and Cooperative   Speech/Thought Process: Coherent   Insight: Good   Judgement: Good   Modes of Intervention: Art   Patient Response to Interventions:  Attentive, Engaged, Interested , and Receptive   Education Outcome:  Acknowledges education   Clinical Observations/Individualized Feedback: [name] was active in their participation of session activities and group discussion. Pt identified "a beach with a pier or dock on it and the ocean. Maybe some jellyfish because they're soft and stretchy. I would be fishing off the dock." Pt spontaneously contributed to group discussion while interacting appropriately with LRT and peers duration of session.  Pt chose to go outside for fresh air and sunlight after the art activity.    Plan: Continue to engage patient in RT group sessions 2-3x/week.   Rosina Lowenstein, LRT, CTRS 05/27/2022 3:11 PM

## 2022-05-27 NOTE — BHH Counselor (Signed)
CSW called [name] with [facility] for clarification on barriers to patient being accepted as it appeared that patient was accepted on Friday.  She reports that the patient's guardian declined their home at this time due to lack of 24 hour supervision.  CSW attempted to call the supervisor of the patient's guardian, [name], [phone].  CSW left HIPAA compliant voicemail.  [name], MSW, LCSW 05/27/2022 11:13 AM

## 2022-05-27 NOTE — BHH Counselor (Signed)
CSW received call from patient's guardian.  Guardian reports that patient has an interview with a group home/Assisted Living, Roddie Mc with [facility] 05/28/2022 at [facility].  CSW reports that this writer scheduled the interview and was aware.  CSW confirmed that guardian declined previous accepting group home.  CSW informed that the patient has been ready for discharge and placement needs to be priority and declines are concerning.  [name] reports "hopefully" the interview with Roddie Mc goes well.  She reports that per Roddie Mc she has "immediate availability" in her Assisted Living.    CSW reminded that patient is deemed group home appropriate at this time.  [name], MSW, LCSW 05/27/2022 2:18 PM

## 2022-05-27 NOTE — BH IP Treatment Plan (Signed)
Interdisciplinary Treatment and Diagnostic Plan Update  05/27/2022 Time of Session: 10:00AM [name] MRN: [id number]  Principal Diagnosis: Schizoaffective disorder, bipolar type [facility])  Secondary Diagnoses: Principal Problem:   Schizoaffective disorder, bipolar type (HCC)   Current Medications:  Current Facility-Administered Medications  Medication Dose Route Frequency Provider Last Rate Last Admin   acetaminophen (TYLENOL) tablet 650 mg  650 mg Oral Q6H PRN Bennett, Christal H, NP   650 mg at 05/07/22 0832   alum & mag hydroxide-simeth (MAALOX/MYLANTA) 200-200-20 MG/5ML suspension 30 mL  30 mL Oral Q4H PRN Bennett, Christal H, NP       amantadine (SYMMETREL) capsule 100 mg  100 mg Oral BID Bennett, Christal H, NP   100 mg at 05/27/22 0927   amLODipine (NORVASC) tablet 5 mg  5 mg Oral Daily Bennett, Christal H, NP   5 mg at 05/27/22 0927   atorvastatin (LIPITOR) tablet 40 mg  40 mg Oral QHS Bennett, Christal H, NP   40 mg at 05/26/22 2138   carbamazepine (TEGRETOL XR) 12 hr tablet 400 mg  400 mg Oral QHS Sarina Ill, DO   400 mg at 05/26/22 2139   chlorproMAZINE (THORAZINE) tablet 50 mg  50 mg Oral Q6H PRN Sarina Ill, DO   50 mg at 05/26/22 1026   fluticasone furoate-vilanterol (BREO ELLIPTA) 200-25 MCG/ACT 1 puff  1 puff Inhalation Daily Bennett, Christal H, NP   1 puff at 05/26/22 0926   LORazepam (ATIVAN) tablet 2 mg  2 mg Oral TID PRN Thurston Hole H, NP   2 mg at 05/26/22 1026   Or   LORazepam (ATIVAN) injection 2 mg  2 mg Intramuscular TID PRN Willeen Cass, Christal H, NP   2 mg at 05/09/22 1031   losartan (COZAAR) tablet 100 mg  100 mg Oral Daily Sarina Ill, DO   100 mg at 05/27/22 1478   magnesium hydroxide (MILK OF MAGNESIA) suspension 30 mL  30 mL Oral Daily PRN Bennett, Christal H, NP       nicotine (NICODERM CQ - dosed in mg/24 hours) patch 14 mg  14 mg Transdermal Daily Sarina Ill, DO   14 mg at 05/26/22 2956    risperiDONE (RISPERDAL) tablet 2 mg  2 mg Oral [id number] Sarina Ill, DO   2 mg at 05/27/22 2130   traZODone (DESYREL) tablet 100 mg  100 mg Oral QHS PRN Sarina Ill, DO   100 mg at 05/26/22 2138   PTA Medications: Medications Prior to Admission  Medication Sig Dispense Refill Last Dose   amantadine (SYMMETREL) 100 MG capsule Take 100 mg by mouth 2 (two) times daily.   05/06/2022   atorvastatin (LIPITOR) 40 MG tablet Take 1 tablet (40 mg total) by mouth at bedtime. 30 tablet 1 05/06/2022   lithium carbonate (LITHOBID) 300 MG CR tablet Take 1 tablet (300 mg total) by mouth 3 (three) times daily. (Patient taking differently: Take 600 mg by mouth at bedtime.) 90 tablet 1 05/06/2022   amLODipine (NORVASC) 5 MG tablet Take 1 tablet (5 mg total) by mouth daily. 30 tablet 1 Unknown   fluticasone furoate-vilanterol (BREO ELLIPTA) 200-25 MCG/ACT AEPB Inhale 1 puff into the lungs daily. 1 each 1 Unknown   losartan (COZAAR) 50 MG tablet Take 1 tablet (50 mg total) by mouth daily. 30 tablet 1 Unknown   OLANZapine (ZYPREXA) 10 MG tablet Take 1 tablet (10 mg total) by mouth daily. (Patient not taking: Reported on 05/02/2022) 30 tablet 1  Unknown   OLANZapine-Samidorphan (LYBALVI) 10-10 MG TABS Take 1 tablet by mouth daily.   Unknown   QUEtiapine (SEROQUEL) 200 MG tablet Take 1 tablet (200 mg total) by mouth at bedtime. (Patient not taking: Reported on 05/02/2022) 30 tablet 1 Unknown   risperiDONE (RISPERDAL M-TABS) 1 MG disintegrating tablet Take 1 tablet (1 mg total) by mouth 2 (two) times daily as needed (agitation). 60 tablet 1 Unknown    Patient Stressors: Marital or family conflict   Traumatic event    Patient Strengths: Ability for insight  Motivation for treatment/growth   Treatment Modalities: Medication Management, Group therapy, Case management,  1 to 1 session with clinician, Psychoeducation, Recreational therapy.   Physician Treatment Plan for Primary Diagnosis:  Schizoaffective disorder, bipolar type (HCC) Long Term Goal(s): Improvement in symptoms so as ready for discharge   Short Term Goals: Ability to identify changes in lifestyle to reduce recurrence of condition will improve Ability to verbalize feelings will improve Ability to disclose and discuss suicidal ideas Ability to demonstrate self-control will improve Ability to identify and develop effective coping behaviors will improve Ability to maintain clinical measurements within normal limits will improve Compliance with prescribed medications will improve Ability to identify triggers associated with substance abuse/mental health issues will improve  Medication Management: Evaluate patient's response, side effects, and tolerance of medication regimen.  Therapeutic Interventions: 1 to 1 sessions, Unit Group sessions and Medication administration.  Evaluation of Outcomes: Adequate for Discharge  Physician Treatment Plan for Secondary Diagnosis: Principal Problem:   Schizoaffective disorder, bipolar type (HCC)  Long Term Goal(s): Improvement in symptoms so as ready for discharge   Short Term Goals: Ability to identify changes in lifestyle to reduce recurrence of condition will improve Ability to verbalize feelings will improve Ability to disclose and discuss suicidal ideas Ability to demonstrate self-control will improve Ability to identify and develop effective coping behaviors will improve Ability to maintain clinical measurements within normal limits will improve Compliance with prescribed medications will improve Ability to identify triggers associated with substance abuse/mental health issues will improve     Medication Management: Evaluate patient's response, side effects, and tolerance of medication regimen.  Therapeutic Interventions: 1 to 1 sessions, Unit Group sessions and Medication administration.  Evaluation of Outcomes: Adequate for Discharge   RN Treatment Plan for  Primary Diagnosis: Schizoaffective disorder, bipolar type (HCC) Long Term Goal(s): Knowledge of disease and therapeutic regimen to maintain health will improve  Short Term Goals: Ability to demonstrate self-control, Ability to participate in decision making will improve, Ability to verbalize feelings will improve, Ability to disclose and discuss suicidal ideas, Ability to identify and develop effective coping behaviors will improve, and Compliance with prescribed medications will improve  Medication Management: RN will administer medications as ordered by provider, will assess and evaluate patient's response and provide education to patient for prescribed medication. RN will report any adverse and/or side effects to prescribing provider.  Therapeutic Interventions: 1 on 1 counseling sessions, Psychoeducation, Medication administration, Evaluate responses to treatment, Monitor vital signs and CBGs as ordered, Perform/monitor CIWA, COWS, AIMS and Fall Risk screenings as ordered, Perform wound care treatments as ordered.  Evaluation of Outcomes: Adequate for Discharge   LCSW Treatment Plan for Primary Diagnosis: Schizoaffective disorder, bipolar type (HCC) Long Term Goal(s): Safe transition to appropriate next level of care at discharge, Engage patient in therapeutic group addressing interpersonal concerns.  Short Term Goals: Engage patient in aftercare planning with referrals and resources, Increase social support, Increase ability to appropriately verbalize feelings,  Increase emotional regulation, Facilitate acceptance of mental health diagnosis and concerns, and Increase skills for wellness and recovery  Therapeutic Interventions: Assess for all discharge needs, 1 to 1 time with Social worker, Explore available resources and support systems, Assess for adequacy in community support network, Educate family and significant other(s) on suicide prevention, Complete Psychosocial Assessment, Interpersonal  group therapy.  Evaluation of Outcomes: Adequate for Discharge   Progress in Treatment: Attending groups: Yes. Participating in groups: Yes. Taking medication as prescribed: Yes. Toleration medication: Yes. Family/Significant other contact made: Yes, individual(s) contacted:  SPE completed with the patient's guardian. Patient understands diagnosis: No. Discussing patient identified problems/goals with staff: Yes. Medical problems stabilized or resolved: Yes. Denies suicidal/homicidal ideation: Yes. Issues/concerns per patient self-inventory: No. Other: none  New problem(s) identified: No, Describe:  none Update 05/12/2022:  No changes at this time. Update 05/17/22: No changes at this time.  Update 05/22/2022:  No changes at this time.  Update 05/27/2022:  No changes at this time.    New Short Term/Long Term Goal(s): elimination of symptoms of psychosis, medication management for mood stabilization; elimination of SI thoughts; development of comprehensive mental wellness/sobriety plan. Update 05/12/2022:  No changes at this time Update 05/17/22: No changes at this time.  Update 05/22/2022:  No changes at this time.  Update 05/27/2022:  No changes at this time.    Patient Goals:  Patient unable to participate in treatment team.  Patient was asleep following administration of medication during agitation protocol. Update 05/12/2022:  Patient remains safe on the unit at this time.  Patient continues to clear up as time progresses.  Patient, however, is still not at baseline.  Patient able to return to his group home, however, will have to leave as he has been served a notice.  Update 05/17/22: More clear and able to have conversations with you.  Update 05/22/2022:  Patient is ready for discharge.  Guardian has not identified a appropriate placement for patient.  CSW has referred patient out but not received any acceptance.    Update 05/27/2022:  Guardian reports that she has not looked for placement for patient,  stating that this is not her role.  CSW has assisted patient  in having an interview with a group home, who at this time, however, reports that someone has declined him for them.  CSW to follow up on this. Placement continues to be the need at this time.    Discharge Plan or Barriers: CSW to assist with discharge plans.  Update 05/12/2022:  No changes at this time.  Update 05/17/22: Patient remains a placement concern. Guardian is working to identify possible group home placement.  Update 05/22/2022:  No changes at this time.Update 05/27/2022:  No changes at this time.   Last 3 [place] Suicide Severity Risk Score: Flowsheet Row Admission (Current) from 05/06/2022 in [facility] BEHAVIORAL MEDICINE ED from 05/02/2022 in [facility] Emergency Department at [facility] Admission (Discharged) from 05/02/2021 in [facility] INPATIENT BEHAVIORAL MEDICINE  C-SSRS RISK CATEGORY No Risk No Risk No Risk       Last PHQ 2/9 Scores:     No data to display          Scribe for Treatment Team: Harden Mo, [place] Mt 05/27/2022 9:57 AM
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
## 2022-05-28 DIAGNOSIS — F250 Schizoaffective disorder, bipolar type: Secondary | ICD-10-CM | POA: Diagnosis not present

## 2022-05-28 NOTE — Group Note (Signed)
Date:  05/28/2022 Time:  8:49 PM  Group Topic/Focus:  Self Esteem Action Plan:   The focus of this group is to help patients create a plan to continue to build self-esteem after discharge.    Participation Level:  Active  Participation Quality:  Sharing  Affect:  Appropriate  Cognitive:  Appropriate  Insight: Improving  Engagement in Group:  Engaged  Modes of Intervention:  Activity and Discussion  Additional Comments:    Osker Mason 05/28/2022, 8:49 PM

## 2022-05-28 NOTE — Progress Notes (Signed)
Patient appears to be responding to Internal stimuli. Denies visual or auditory hallucinations when asked. Multiple showers taken today. Tolerated all  medications and meals. Denies SI or HI. Anxious about discharge with multiple questions where and when he will be leaving. Patient reassured several times that the social worker is working on his discharge. Will continue to monitor.

## 2022-05-28 NOTE — Progress Notes (Signed)
   05/28/22 2000  Psych Admission Type (Psych Patients Only)  Admission Status Voluntary  Psychosocial Assessment  Patient Complaints Hyperactivity  Eye Contact Fair  Facial Expression Animated  Affect Appropriate to circumstance  Speech Slurred  Interaction Assertive  Motor Activity Hyperactive;Restless  Appearance/Hygiene In scrubs  Behavior Characteristics Cooperative;Hyperactive;Restless  Mood Pleasant  Thought Process  Coherency Tangential  Content Preoccupation  Delusions None reported or observed  Perception WDL  Hallucination None reported or observed  Judgment Impaired  Confusion Mild  Danger to Self  Current suicidal ideation? Denies  Danger to Others  Danger to Others None reported or observed

## 2022-05-28 NOTE — Progress Notes (Signed)
   05/28/22 0610  15 Minute Checks  Location Dayroom  Visual Appearance Calm  Behavior Composed  Sleep (Behavioral Health Patients Only)  Calculate sleep? (Click Yes once per 24 hr at 0600 safety check) Yes  Documented sleep last 24 hours 8

## 2022-05-28 NOTE — Group Note (Signed)
[facility] LCSW Group Therapy Note   Group Date: 05/28/2022 Start Time: 1315 End Time: 1345   Type of Therapy/Topic:  Group Therapy:  Emotion Regulation  Participation Level:  Active   Mood:  Description of Group:    The purpose of this group is to assist patients in learning to regulate negative emotions and experience positive emotions. Patients will be guided to discuss ways in which they have been vulnerable to their negative emotions. These vulnerabilities will be juxtaposed with experiences of positive emotions or situations, and patients challenged to use positive emotions to combat negative ones. Special emphasis will be placed on coping with negative emotions in conflict situations, and patients will process healthy conflict resolution skills.  Therapeutic Goals: Patient will identify two positive emotions or experiences to reflect on in order to balance out negative emotions:  Patient will label two or more emotions that they find the most difficult to experience:  Patient will be able to demonstrate positive conflict resolution skills through discussion or role plays:   Summary of Patient Progress:   Patient was present in group.  Patient reports that he listens to "[employer]" as his coping skill.  Patient was tangential and made loose connections.  Patient was off topic and began to discuss "[employer]gie Smalls" and  "[place]", however, unclear where this train of thought was from or what was the connection to group topic.    Therapeutic Modalities:   Cognitive Behavioral Therapy Feelings Identification Dialectical Behavioral Therapy   Harden Mo, LCSW
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
## 2022-05-29 DIAGNOSIS — F250 Schizoaffective disorder, bipolar type: Secondary | ICD-10-CM | POA: Diagnosis not present

## 2022-05-29 MED ORDER — TUBERCULIN PPD 5 UNIT/0.1ML ID SOLN
5.0000 [IU] | Freq: Once | INTRADERMAL | Status: AC
  Administered 2022-05-29: 5 [IU] via INTRADERMAL
  Filled 2022-05-29: qty 0.1

## 2022-05-29 NOTE — BHH Counselor (Signed)
CSW assisted the patient in completing an interview with Maris[facility] Humble for her group home.  Patient did well in the interview.  Patient was appropriate and asked appropriate questions.    CSW reviewed medications and aftercare.    Patient tentatively accepted to group home.  Patient will need a TB test.  [name], MSW, LCSW 05/29/2022 3:08 PM

## 2022-05-29 NOTE — Plan of Care (Signed)
  Problem: Safety: Goal: Ability to remain free from injury will improve Outcome: Progressing   Problem: Education: Goal: Ability to state activities that reduce stress will improve Outcome: Progressing   Problem: Coping: Goal: Level of anxiety will decrease Outcome: Not Progressing

## 2022-05-29 NOTE — [facility] Counselor (Signed)
CSW did speak with [name], patient's former group hoem who reports that patietn could possibly return there if there is no other option.    He also reports that he has a couple of referrals of placements that may be appropriate for patient, should placement not follow through.    He requested that CSW follow up.   Situation ongoing.  [name], MSW, LCSW 05/29/2022 4:12 PM
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
## 2022-05-29 NOTE — Group Note (Signed)
LCSW Group Therapy Note  Group Date: 05/29/2022 Start Time: 1310 End Time: 1355   Type of Therapy and Topic:  Group Therapy - Healthy vs Unhealthy Coping Skills  Participation Level:  Active   Description of Group The focus of this group was to determine what unhealthy coping techniques typically are used by group members and what healthy coping techniques would be helpful in coping with various problems. Patients were guided in becoming aware of the differences between healthy and unhealthy coping techniques. Patients were asked to identify 2-3 healthy coping skills they would like to learn to use more effectively.  Therapeutic Goals Patients learned that coping is what human beings do all day long to deal with various situations in their lives Patients defined and discussed healthy vs unhealthy coping techniques Patients identified their preferred coping techniques and identified whether these were healthy or unhealthy Patients determined 2-3 healthy coping skills they would like to become more familiar with and use more often. Patients provided support and ideas to each other   Summary of Patient Progress:  During group, patient expressed that he makes sure he takes care of his self care. He reports that he takes of his personal hygiene as self care. Patient proved open to input from peers and feedback from CSW. Patient demonstrated fair insight into the subject matter, was respectful of peers, and participated throughout the entire session.   Therapeutic Modalities Cognitive Behavioral Therapy Motivational Interviewing  [name], [place] 05/29/2022  3:02 PM

## 2022-05-29 NOTE — Group Note (Signed)
Recreation Therapy Group Note   Group Topic:Self-Esteem  Group Date: 05/29/2022 Start Time: 1400 End Time: 1455 Facilitators: Rosina Lowenstein, LRT, CTRS Location:  Dayroom  Group Description: Patients and LRT discussed the importance of self-love and self-esteem. Pt completed a worksheet that helps them identify 24 different strengths and qualities about themselves. Pt encouraged to read aloud at least 3 off their sheet to the group. LRT and pts discussed how this can be applied to daily life post-discharge.  Pt's then played "Positive Affirmation Bingo" afterwards, with journals, activity books or stress balls as bingo prizes.  Goal Area(s) Addressed: Patient will identify positive qualities about themselves. Patient will learn new positive affirmations.  Patient will recite positive qualities and affirmations aloud to the group.  Patient will increase communication.  Affect/Mood: Appropriate   Participation Level: Active and Engaged   Participation Quality: Independent   Behavior: Appropriate, Calm, and Cooperative   Speech/Thought Process: Coherent   Insight: Good   Judgement: Good   Modes of Intervention: Activity and Worksheet   Patient Response to Interventions:  Attentive, Engaged, Interested , and Receptive   Education Outcome:  Acknowledges education   Clinical Observations/Individualized Feedback: [name] came late to group after having a meeting with social work about placement. Pt came to group and participated in multiple games of bingo. Pt chose an adult coloring book after winning a game. Pt interacted well with LRT and peers while he was in group.  Plan: Continue to engage patient in RT group sessions 2-3x/week.   Rosina Lowenstein, LRT, CTRS 05/29/2022 3:36 PM

## 2022-05-29 NOTE — Progress Notes (Addendum)
Patient is alert and oriented. He denies SI/HI/AVH. He denies anxiety and depression. Patient had an interview with a group home and it appeared to go well. Patient was excited about the potential of being placed. Pain 0/10.  Patient requested to change his bed linens for an unspecified reason. "I don't know what's wrong. I need to spray lysol." Patient was accommodated and assisted with request.  Tuberculin injection (PPD) placed on L forearm at 1730. A negative reading is required for admission into group home. Patient appeared to tolerate well.  Q15 minute unit checks in place.

## 2022-05-30 DIAGNOSIS — F250 Schizoaffective disorder, bipolar type: Secondary | ICD-10-CM | POA: Diagnosis not present

## 2022-05-30 NOTE — Plan of Care (Signed)
Pt is fidgety and restless. Slurred speech. Pt requesting to shower and change linen at 0300. States "I'm itchy". Clean linen provided. shower encouraged at 0500. Verbalized understanding. Slept at short intervals throughout the night. No behavior issues noted.  Denies SI/HI/AV/H. Plan of care continued.   Problem: Activity: Goal: Risk for activity intolerance will decrease Outcome: Progressing   Problem: Nutrition: Goal: Adequate nutrition will be maintained Outcome: Progressing   Problem: Coping: Goal: Level of anxiety will decrease Outcome: Progressing   Problem: Pain Managment: Goal: General experience of comfort will improve Outcome: Progressing   Problem: Safety: Goal: Ability to remain free from injury will improve Outcome: Progressing

## 2022-05-30 NOTE — Group Note (Signed)
Recreation Therapy Group Note   Group Topic:Health and Wellness  Group Date: 05/30/2022 Start Time: 1400 End Time: 1445 Facilitators: Rosina Lowenstein, LRT, CTRS Location:  Dayroom  Group Description: Seated Exercise. LRT discussed the mental and physical benefits of exercise. LRT and group discussed how physical activity can be used as a coping skill. Pt's and LRT followed along to an exercise video on the TV screen that provided a visual representation and audio description of every exercise performed. Pt's encouraged to listen to their bodies and stop at any time if they experience feelings of discomfort or pain. LRT passed out water after session was over and encouraged pts do drink and stay hydrated.  Goal Area(s) Addressed: Patient will learn benefits of physical activity. Patient will identify exercise as a coping skill.  Patient will follow multistep directions. Patient will try a new leisure interest.   Affect/Mood: Appropriate   Participation Level: Active and Engaged   Participation Quality: Independent   Behavior: Calm and Cooperative   Speech/Thought Process: Coherent   Insight: Good   Judgement: Good   Modes of Intervention: Activity   Patient Response to Interventions:  Attentive, Engaged, Interested , and Receptive   Education Outcome:  Acknowledges education   Clinical Observations/Individualized Feedback: [name] was active in their participation of session activities and group discussion. Pt shared "[place] I [place] of here on Monday." Pt successfully completed most exercises, however seemed fixated on living situation during conversation.    Plan: Continue to engage patient in RT group sessions 2-3x/week.   Rosina Lowenstein, LRT, CTRS 05/30/2022 3:09 PM
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
## 2022-05-30 NOTE — [facility] Counselor (Addendum)
[name] confirms that patient can be transported by [employer].  She reports that the facility can assist the patient in starting services with [name] who comes into the facility.  [name] requests that d/c summary be sent to [phone].  [name], MSW, LCSW 05/30/2022 2:20 PM     CSW called and spoke with the patients guardian on transportation to the group home on Monday.  CSW explained that expectation is for guardianship to provide transportation for the patient to and from the facility.    CSW explained that hospital transportation is a last resort.    Guardian reports that she will follow up the co-owner of [facility], and call this CSW back.  [name], MSW, LCSW 05/30/2022 2:04 PM

## 2022-05-30 NOTE — Group Note (Signed)
Date:  05/30/2022 Time:  10:23 PM  Group Topic/Focus:  Goals Group:   The focus of this group is to help patients establish daily goals to achieve during treatment and discuss how the patient can incorporate goal setting into their daily lives to aide in recovery.    Participation Level:  Active  Participation Quality:  Attentive  Affect:  Appropriate  Cognitive:  Alert  Insight: Appropriate  Engagement in Group:  Engaged  Modes of Intervention:  Education  Additional Comments:    [name] 05/30/2022, 10:23 PM

## 2022-05-30 NOTE — Group Note (Signed)
Date:  05/30/2022 Time:  10:49 AM  Group Topic/Focus:  Personal Choices and Values:   The focus of this group is to help patients assess and explore the importance of values in their lives, how their values affect their decisions, how they express their values and what opposes their expression.    Participation Level:  Active  Participation Quality:  Appropriate  Affect:  Appropriate  Cognitive:  Alert and Appropriate  Insight: Appropriate  Engagement in Group:  Engaged  Modes of Intervention:  Discussion  Additional Comments:    Jemaine Prokop L Sherryn Pollino 05/30/2022, 10:49 AM  

## 2022-05-30 NOTE — Group Note (Signed)
Date:  05/30/2022 Time:  3:55 PM  Group Topic/Focus:  Outside game group. Learning sportsmanship, cheering each other on. Supporting and promoting positivity.     Participation Level:  Active  Participation Quality:  Appropriate  Affect:  Appropriate  Cognitive:  Appropriate  Insight: Appropriate  Engagement in Group:  Engaged  Modes of Intervention:  Activity and Education  Additional Comments:    [name] 05/30/2022, 3:55 PM

## 2022-05-30 NOTE — Plan of Care (Signed)
  Problem: Pain Managment: Goal: General experience of comfort will improve Outcome: Progressing   Problem: Education: Goal: Ability to state activities that reduce stress will improve Outcome: Progressing   Problem: Coping: Goal: Level of anxiety will decrease Outcome: Not Progressing

## 2022-05-30 NOTE — [place] FL2 (Signed)
[place] MEDICAID FL2 LEVEL OF CARE FORM     IDENTIFICATION  Patient Name: [name] Birthdate: 01-05-1961 Sex: male Admission Date (Current Location): 05/06/2022  [place] and [place] Number:  [name] [id number] [facility] Facility and Address:  [facility], [address], [place], [place] [address]      Provider Number: [id number]  Attending Physician Name and Address:  Sarina Ill,*  Relative Name and Phone Number:  Legal Guardian, [facility], Wilhemina Cash [phone]    Current Level of Care: Hospital Recommended Level of Care: Assisted Living Facility, Family Care Home, Other (Comment) (Group Home) Prior Approval Number:    Date Approved/Denied:   PASRR Number:    Discharge Plan: Home    Current Diagnoses: Patient Active Problem List   Diagnosis Date Noted   Schizoaffective disorder, bipolar type (HCC) 06/15/2016   Hypertension 06/13/2016   Tobacco use disorder 10/04/2015   Noncompliance 10/03/2015    Orientation RESPIRATION BLADDER Height & Weight     Self, Time, Situation, Place  Normal Continent Weight: 147 lb (66.7 kg) Height:  5\' 10"  (177.8 cm)  BEHAVIORAL SYMPTOMS/MOOD NEUROLOGICAL BOWEL NUTRITION STATUS  Other (Comment) (NA)  (NA) Continent Diet (Disphasia 3, all meats chopped and moistened)  AMBULATORY STATUS COMMUNICATION OF NEEDS Skin   Independent Verbally Normal                       Personal Care Assistance Level of Assistance  Bathing Bathing Assistance: Independent         Functional Limitations Info  Speech     Speech Info: Impaired (Speech can be impaired)    SPECIAL CARE FACTORS FREQUENCY   (NA)                    Contractures Contractures Info: Not present    Additional Factors Info  Code Status, Allergies, Psychotropic Code Status Info: Full Allergies Info: Haldol Psychotropic Info: risperiDONE (RISPERDAL) tablet 2 mg  Dose: 2 mg         Current Medications  (05/30/2022):  This is the current hospital active medication list Current Facility-Administered Medications  Medication Dose Route Frequency Provider Last Rate Last Admin   acetaminophen (TYLENOL) tablet 650 mg  650 mg Oral Q6H PRN Bennett, Christal H, NP   650 mg at 05/07/22 0832   alum & mag hydroxide-simeth (MAALOX/MYLANTA) 200-200-20 MG/5ML suspension 30 mL  30 mL Oral Q4H PRN Bennett, Christal H, NP       amantadine (SYMMETREL) capsule 100 mg  100 mg Oral BID Bennett, Christal H, NP   100 mg at 05/30/22 0909   amLODipine (NORVASC) tablet 5 mg  5 mg Oral Daily Bennett, Christal H, NP   5 mg at 05/30/22 0910   atorvastatin (LIPITOR) tablet 40 mg  40 mg Oral QHS Bennett, Christal H, NP   40 mg at 05/29/22 2103   carbamazepine (TEGRETOL XR) 12 hr tablet 400 mg  400 mg Oral QHS Sarina Ill, DO   400 mg at 05/29/22 2102   chlorproMAZINE (THORAZINE) tablet 50 mg  50 mg Oral Q6H PRN Sarina Ill, DO   50 mg at 05/26/22 1026   fluticasone furoate-vilanterol (BREO ELLIPTA) 200-25 MCG/ACT 1 puff  1 puff Inhalation Daily Bennett, Christal H, NP   1 puff at 05/30/22 0913   LORazepam (ATIVAN) tablet 2 mg  2 mg Oral TID PRN Thurston Hole H, NP   2 mg at 05/26/22 1026   Or  LORazepam (ATIVAN) injection 2 mg  2 mg Intramuscular TID PRN Bennett, Christal H, NP   2 mg at 05/09/22 1031   losartan (COZAAR) tablet 100 mg  100 mg Oral Daily Sarina Ill, DO   100 mg at 05/30/22 2956   magnesium hydroxide (MILK OF MAGNESIA) suspension 30 mL  30 mL Oral Daily PRN Bennett, Christal H, NP       nicotine (NICODERM CQ - dosed in mg/24 hours) patch 14 mg  14 mg Transdermal Daily Sarina Ill, DO   14 mg at 05/30/22 0910   risperiDONE (RISPERDAL) tablet 2 mg  2 mg Oral BH-q8a4p Sarina Ill, DO   2 mg at 05/30/22 2130   traZODone (DESYREL) tablet 100 mg  100 mg Oral QHS PRN Sarina Ill, DO   100 mg at 05/26/22 2138   tuberculin injection 5 Units  5  Units Intradermal Once Sarina Ill, DO   5 Units at 05/29/22 1729     Discharge Medications: Please see discharge summary for a list of discharge medications.  Relevant Imaging Results:  Relevant Lab Results:   Additional Information Social Security number [id number]  [place], [place]

## 2022-05-30 NOTE — Group Note (Signed)
Date:  05/30/2022 Time:  10:46 AM  Group Topic/Focus:  Outside/courtyard time     Participation Level:  Active  Participation Quality:  Appropriate  Affect:  Appropriate  Cognitive:  Appropriate  Insight: Appropriate  Engagement in Group:  Engaged  Modes of Intervention:  Activity  Additional Comments:    [name] 05/30/2022, 10:46 AM

## 2022-05-30 NOTE — Progress Notes (Signed)
Patient is alert and oriented. Affect flat. Mood pleasant. He denies anxiety and depression. He denies SI/HI/AVH.  Patient attended groups and interacted well with other patients and staff.  Q15 minute unit checks in place.

## 2022-05-31 DIAGNOSIS — F250 Schizoaffective disorder, bipolar type: Secondary | ICD-10-CM | POA: Diagnosis not present

## 2022-05-31 NOTE — Progress Notes (Signed)
Patient is alert and oriented. Mood and affect preoccupied. He denies SI/HI/AVH. He denies anxiety and depression. Appetite good. Denies pain. Patient is preoccupied with the thought of being discharged. Engaged in active listening. Reassurance provided. Patient is adherent to taking medications and appears to be in good spirits. No PRN's adm.  The result of the PPD reading on the left arm is negative. This result has been placed on the PPD flowsheet.  Q15 minute unit checks in place.

## 2022-05-31 NOTE — Progress Notes (Signed)
  The following PPD data indicate a negative result.   05/31/22 1730  PPD Results  Does patient have an induration at the injection site? No  Induration(mm) 0 mm  Name of Physician Notified N/A

## 2022-05-31 NOTE — Group Note (Deleted)
[facility] LCSW Group Therapy Note   Group Date: 05/31/2022 Start Time: 1300 End Time: 1400  Type of Therapy and Topic:  Group Therapy:  Feelings around Relapse and Recovery  Participation Level:  {[facility] PARTICIPATION LEVEL:22264}   Mood:  Description of Group:    Patients in this group will discuss emotions they experience before and after a relapse. They will process how experiencing these feelings, or avoidance of experiencing them, relates to having a relapse. Facilitator will guide patients to explore emotions they have related to recovery. Patients will be encouraged to process which emotions are more powerful. They will be guided to discuss the emotional reaction significant others in their lives may have to patients' relapse or recovery. Patients will be assisted in exploring ways to respond to the emotions of others without this contributing to a relapse.  Therapeutic Goals: 1. Patient will identify two or more emotions that lead to relapse for them:  2. Patient will identify two emotions that result when they relapse:  3. Patient will identify two emotions related to recovery:  4. Patient will demonstrate ability to communicate their needs through discussion and/or role plays.   Summary of Patient Progress:  ***   Therapeutic Modalities:   Cognitive Behavioral Therapy Solution-Focused Therapy Assertiveness Training Relapse Prevention Therapy   [name] W [name], LCSWA 
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
## 2022-05-31 NOTE — [facility] Group Notes (Signed)
[facility] Group Notes:  (Nursing/MHT/Case Management/Adjunct)  Date:  05/31/2022  Time:  9:56 AM  Type of Therapy:   meditation therapy  Participation Level:  Active  Participation Quality:  Appropriate  Affect:  Appropriate  Cognitive:  Appropriate  Insight:  Appropriate  Engagement in Group:  Engaged  Modes of Intervention:  Activity  Summary of Progress/Problems:  [name] 05/31/2022, 9:56 AM

## 2022-05-31 NOTE — Plan of Care (Signed)
Calm and cooperative. Pt focused on discharge. Interacting with peers and staff. Attends group. Po med compliant. Support and encouragement provided. Q 15 min checks maintained for safety. No PRNs given. Denies SI/HI/AV/H. No c/o pain/discomfort noted.   Problem: Activity: Goal: Risk for activity intolerance will decrease Outcome: Progressing   Problem: Nutrition: Goal: Adequate nutrition will be maintained Outcome: Progressing   Problem: Elimination: Goal: Will not experience complications related to bowel motility Outcome: Progressing   Problem: Elimination: Goal: Will not experience complications related to urinary retention Outcome: Progressing

## 2022-06-01 DIAGNOSIS — F250 Schizoaffective disorder, bipolar type: Secondary | ICD-10-CM | POA: Diagnosis not present

## 2022-06-01 NOTE — Plan of Care (Signed)
Denies SI / HI / AVH. Pt is frequently in milieu and nurses station with tangential request. Pt is pleasant on assessment. No signs of distress or injury. Staff will continue Q15 minutes safety checks.     Problem: Education: Goal: Knowledge of General Education information will improve Description: Including pain rating scale, medication(s)/side effects and non-pharmacologic comfort measures Outcome: Not Progressing   Problem: Health Behavior/Discharge Planning: Goal: Ability to manage health-related needs will improve Outcome: Not Progressing   Problem: Clinical Measurements: Goal: Ability to maintain clinical measurements within normal limits will improve Outcome: Not Progressing

## 2022-06-01 NOTE — Plan of Care (Signed)
  Problem: Coping: Goal: Level of anxiety will decrease Outcome: Progressing   Problem: Safety: Goal: Ability to remain free from injury will improve Outcome: Progressing   Problem: Education: Goal: Ability to state activities that reduce stress will improve Outcome: Progressing   Problem: Coping: Goal: Ability to identify and develop effective coping behavior will improve Outcome: Progressing  Patient complaint with medication continues to pace this evening. Patient is anxious about the discharge has been asking about the new place. Reassurance given to Pt support and encouragement ongoing. Q 15 minutes safety checks ongoing Patient remains safe.

## 2022-06-01 NOTE — Plan of Care (Signed)
Pt is irritable and easily agitated at times. Able to redirect. Focused on discharge states "I want to smoke a cigarette". Support and encouragement provided. Po med compliant. No PRNs given. Q 15 min checks maintained for safety. Denies SI/HI/AV/H. No c/o pain/discomfort noted.    Problem: Education: Goal: Knowledge of General Education information will improve Description: Including pain rating scale, medication(s)/side effects and non-pharmacologic comfort measures Outcome: Progressing   Problem: Activity: Goal: Risk for activity intolerance will decrease Outcome: Progressing   Problem: Coping: Goal: Level of anxiety will decrease Outcome: Progressing   Problem: Pain Managment: Goal: General experience of comfort will improve Outcome: Progressing   Problem: Skin Integrity: Goal: Risk for impaired skin integrity will decrease Outcome: Progressing   Problem: Coping: Goal: Ability to identify and develop effective coping behavior will improve Outcome: Progressing

## 2022-06-01 NOTE — Group Note (Signed)
Date:  06/01/2022 Time:  10:42 AM  Group Topic/Focus:  Personal Choices and Values:   The focus of this group is to help patients assess and explore the importance of values in their lives, how their values affect their decisions, how they express their values and what opposes their expression. Rediscovering Joy:   The focus of this group is to explore various ways to relieve stress in a positive manner. Self Care:   The focus of this group is to help patients understand the importance of self-care in order to improve or restore emotional, physical, spiritual, interpersonal, and financial health.    Participation Level:  Active  Participation Quality:  Attentive, Redirectable, and Sharing  Affect:  Appropriate  Cognitive:  Alert, Disorganized, Confused, and Delusional  Insight: Appropriate  Engagement in Group:  Engaged  Modes of Intervention:  Discussion, Rapport Building, and Socialization  Additional Comments:    Leonie Green 06/01/2022, 10:42 AM
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
## 2022-06-02 DIAGNOSIS — F250 Schizoaffective disorder, bipolar type: Secondary | ICD-10-CM | POA: Diagnosis not present

## 2022-06-02 NOTE — [facility] Group Notes (Signed)
[facility] Group Notes:  (Nursing/MHT/Case Management/Adjunct)  Date:  06/02/2022  Time:  10:34 AM  Type of Therapy:  Movement Therapy  Participation Level:  Active  Participation Quality:  Appropriate  Affect:  Appropriate  Cognitive:  Appropriate  Insight:  Appropriate  Engagement in Group:  Engaged  Modes of Intervention:  Activity  Summary of Progress/Problems:  [name] 06/02/2022, 10:34 AM
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
## 2022-06-02 NOTE — Progress Notes (Signed)
   06/02/22 2100  Psych Admission Type (Psych Patients Only)  Admission Status Voluntary  Psychosocial Assessment  Patient Complaints Other (Comment);Anxiety (anxious about discharge tomorrow)  Eye Contact Fair  Facial Expression Animated;Anxious  Affect Anxious  Speech Slurred  Interaction Assertive  Motor Activity Slow  Appearance/Hygiene Unremarkable  Behavior Characteristics Cooperative;Appropriate to situation;Anxious  Mood Pleasant  Thought Process  Coherency Tangential  Content Preoccupation  Delusions None reported or observed  Perception WDL  Hallucination None reported or observed  Judgment Impaired  Confusion Mild  Danger to Self  Current suicidal ideation? Denies  Danger to Others  Danger to Others None reported or observed

## 2022-06-02 NOTE — BH IP Treatment Plan (Signed)
Interdisciplinary Treatment and Diagnostic Plan Update  06/02/2022 Time of Session: 12:00PM [name] MRN: [id number]  Principal Diagnosis: Schizoaffective disorder, bipolar type [name])  Secondary Diagnoses: Principal Problem:   Schizoaffective disorder, bipolar type (HCC)   Current Medications:  Current Facility-Administered Medications  Medication Dose Route Frequency Provider Last Rate Last Admin   acetaminophen (TYLENOL) tablet 650 mg  650 mg Oral Q6H PRN Bennett, Christal H, NP   650 mg at 05/07/22 0832   alum & mag hydroxide-simeth (MAALOX/MYLANTA) 200-200-20 MG/5ML suspension 30 mL  30 mL Oral Q4H PRN Bennett, Christal H, NP       amantadine (SYMMETREL) capsule 100 mg  100 mg Oral BID Bennett, Christal H, NP   100 mg at 06/02/22 0939   amLODipine (NORVASC) tablet 5 mg  5 mg Oral Daily Bennett, Christal H, NP   5 mg at 06/02/22 0939   atorvastatin (LIPITOR) tablet 40 mg  40 mg Oral QHS Bennett, Christal H, NP   40 mg at 06/01/22 2141   carbamazepine (TEGRETOL XR) 12 hr tablet 400 mg  400 mg Oral QHS Sarina Ill, DO   400 mg at 06/01/22 2141   chlorproMAZINE (THORAZINE) tablet 50 mg  50 mg Oral Q6H PRN Sarina Ill, DO   50 mg at 05/26/22 1026   fluticasone furoate-vilanterol (BREO ELLIPTA) 200-25 MCG/ACT 1 puff  1 puff Inhalation Daily Bennett, Christal H, NP   1 puff at 06/01/22 0921   LORazepam (ATIVAN) tablet 2 mg  2 mg Oral TID PRN Thurston Hole H, NP   2 mg at 05/26/22 1026   Or   LORazepam (ATIVAN) injection 2 mg  2 mg Intramuscular TID PRN Willeen Cass, Christal H, NP   2 mg at 05/09/22 1031   losartan (COZAAR) tablet 100 mg  100 mg Oral Daily Sarina Ill, DO   100 mg at 06/02/22 4034   magnesium hydroxide (MILK OF MAGNESIA) suspension 30 mL  30 mL Oral Daily PRN Bennett, Christal H, NP       nicotine (NICODERM CQ - dosed in mg/24 hours) patch 14 mg  14 mg Transdermal Daily Sarina Ill, DO   14 mg at 06/01/22 7425    risperiDONE (RISPERDAL) tablet 2 mg  2 mg Oral BH-q8a4p Sarina Ill, DO   2 mg at 06/02/22 9563   traZODone (DESYREL) tablet 100 mg  100 mg Oral QHS PRN Sarina Ill, DO   100 mg at 05/26/22 2138   PTA Medications: Medications Prior to Admission  Medication Sig Dispense Refill Last Dose   amantadine (SYMMETREL) 100 MG capsule Take 100 mg by mouth 2 (two) times daily.   05/06/2022   atorvastatin (LIPITOR) 40 MG tablet Take 1 tablet (40 mg total) by mouth at bedtime. 30 tablet 1 05/06/2022   lithium carbonate (LITHOBID) 300 MG CR tablet Take 1 tablet (300 mg total) by mouth 3 (three) times daily. (Patient taking differently: Take 600 mg by mouth at bedtime.) 90 tablet 1 05/06/2022   amLODipine (NORVASC) 5 MG tablet Take 1 tablet (5 mg total) by mouth daily. 30 tablet 1 Unknown   fluticasone furoate-vilanterol (BREO ELLIPTA) 200-25 MCG/ACT AEPB Inhale 1 puff into the lungs daily. 1 each 1 Unknown   losartan (COZAAR) 50 MG tablet Take 1 tablet (50 mg total) by mouth daily. 30 tablet 1 Unknown   OLANZapine (ZYPREXA) 10 MG tablet Take 1 tablet (10 mg total) by mouth daily. (Patient not taking: Reported on 05/02/2022) 30 tablet 1  Unknown   OLANZapine-Samidorphan (LYBALVI) 10-10 MG TABS Take 1 tablet by mouth daily.   Unknown   QUEtiapine (SEROQUEL) 200 MG tablet Take 1 tablet (200 mg total) by mouth at bedtime. (Patient not taking: Reported on 05/02/2022) 30 tablet 1 Unknown   risperiDONE (RISPERDAL M-TABS) 1 MG disintegrating tablet Take 1 tablet (1 mg total) by mouth 2 (two) times daily as needed (agitation). 60 tablet 1 Unknown    Patient Stressors: Marital or family conflict   Traumatic event    Patient Strengths: Ability for insight  Motivation for treatment/growth   Treatment Modalities: Medication Management, Group therapy, Case management,  1 to 1 session with clinician, Psychoeducation, Recreational therapy.   Physician Treatment Plan for Primary Diagnosis:  Schizoaffective disorder, bipolar type (HCC) Long Term Goal(s): Improvement in symptoms so as ready for discharge   Short Term Goals: Ability to identify changes in lifestyle to reduce recurrence of condition will improve Ability to verbalize feelings will improve Ability to disclose and discuss suicidal ideas Ability to demonstrate self-control will improve Ability to identify and develop effective coping behaviors will improve Ability to maintain clinical measurements within normal limits will improve Compliance with prescribed medications will improve Ability to identify triggers associated with substance abuse/mental health issues will improve  Medication Management: Evaluate patient's response, side effects, and tolerance of medication regimen.  Therapeutic Interventions: 1 to 1 sessions, Unit Group sessions and Medication administration.  Evaluation of Outcomes: Progressing  Physician Treatment Plan for Secondary Diagnosis: Principal Problem:   Schizoaffective disorder, bipolar type (HCC)  Long Term Goal(s): Improvement in symptoms so as ready for discharge   Short Term Goals: Ability to identify changes in lifestyle to reduce recurrence of condition will improve Ability to verbalize feelings will improve Ability to disclose and discuss suicidal ideas Ability to demonstrate self-control will improve Ability to identify and develop effective coping behaviors will improve Ability to maintain clinical measurements within normal limits will improve Compliance with prescribed medications will improve Ability to identify triggers associated with substance abuse/mental health issues will improve     Medication Management: Evaluate patient's response, side effects, and tolerance of medication regimen.  Therapeutic Interventions: 1 to 1 sessions, Unit Group sessions and Medication administration.  Evaluation of Outcomes: Progressing   RN Treatment Plan for Primary Diagnosis:  Schizoaffective disorder, bipolar type (HCC) Long Term Goal(s): Knowledge of disease and therapeutic regimen to maintain health will improve  Short Term Goals: Ability to demonstrate self-control, Ability to participate in decision making will improve, Ability to verbalize feelings will improve, Ability to disclose and discuss suicidal ideas, Ability to identify and develop effective coping behaviors will improve, and Compliance with prescribed medications will improve  Medication Management: RN will administer medications as ordered by provider, will assess and evaluate patient's response and provide education to patient for prescribed medication. RN will report any adverse and/or side effects to prescribing provider.  Therapeutic Interventions: 1 on 1 counseling sessions, Psychoeducation, Medication administration, Evaluate responses to treatment, Monitor vital signs and CBGs as ordered, Perform/monitor CIWA, COWS, AIMS and Fall Risk screenings as ordered, Perform wound care treatments as ordered.  Evaluation of Outcomes: Progressing   LCSW Treatment Plan for Primary Diagnosis: Schizoaffective disorder, bipolar type (HCC) Long Term Goal(s): Safe transition to appropriate next level of care at discharge, Engage patient in therapeutic group addressing interpersonal concerns.  Short Term Goals: Engage patient in aftercare planning with referrals and resources, Increase social support, Increase ability to appropriately verbalize feelings, Increase emotional regulation, Facilitate acceptance of  mental health diagnosis and concerns, and Increase skills for wellness and recovery  Therapeutic Interventions: Assess for all discharge needs, 1 to 1 time with Social worker, Explore available resources and support systems, Assess for adequacy in community support network, Educate family and significant other(s) on suicide prevention, Complete Psychosocial Assessment, Interpersonal group therapy.  Evaluation  of Outcomes: Progressing   Progress in Treatment: Attending groups: Yes. Participating in groups: Yes. Taking medication as prescribed: Yes. Toleration medication: Yes. Family/Significant other contact made: Yes, individual(s) contacted:  SPE completed with the patients guardian. Patient understands diagnosis: No. Discussing patient identified problems/goals with staff: Yes. Medical problems stabilized or resolved: Yes. Denies suicidal/homicidal ideation: Yes. Issues/concerns per patient self-inventory: Yes. Other: none  New problem(s) identified: No, Describe:  none Update 05/12/2022:  No changes at this time. Update 05/17/22: No changes at this time.  Update 05/22/2022:  No changes at this time.  Update 05/27/2022:  No changes at this time. Update 06/02/2022:  No changes at this time.    New Short Term/Long Term Goal(s): elimination of symptoms of psychosis, medication management for mood stabilization; elimination of SI thoughts; development of comprehensive mental wellness/sobriety plan. Update 05/12/2022:  No changes at this time Update 05/17/22: No changes at this time.  Update 05/22/2022:  No changes at this time.  Update 05/27/2022:  No changes at this time.  Update 06/02/2022:  No changes at this time.    Patient Goals:  Patient unable to participate in treatment team.  Patient was asleep following administration of medication during agitation protocol. Update 05/12/2022:  Patient remains safe on the unit at this time.  Patient continues to clear up as time progresses.  Patient, however, is still not at baseline.  Patient able to return to his group home, however, will have to leave as he has been served a notice.  Update 05/17/22: More clear and able to have conversations with you.  Update 05/22/2022:  Patient is ready for discharge.  Guardian has not identified a appropriate placement for patient.  CSW has referred patient out but not received any acceptance.    Update 05/27/2022:  Guardian reports  that she has not looked for placement for patient, stating that this is not her role.  CSW has assisted patient  in having an interview with a group home, who at this time, however, reports that someone has declined him for them.  CSW to follow up on this. Placement continues to be the need at this time. Update 06/02/2022:  Patient has been admitted to [facility]. CSW to assist in making sure that the patient has all necessary information to assist in transition.   Aftercare appointments have been established.     Discharge Plan or Barriers: CSW to assist with discharge plans.  Update 05/12/2022:  No changes at this time.  Update 05/17/22: Patient remains a placement concern. Guardian is working to identify possible group home placement.  Update 05/22/2022:  No changes at this time.Update 05/27/2022:  No changes at this time. Update 06/02/2022:  TBD  Last 3 [place] Suicide Severity Risk Score: Flowsheet Row Admission (Current) from 05/06/2022 in [facility] BEHAVIORAL MEDICINE ED from 05/02/2022 in [facility] Emergency Department at [facility] Admission (Discharged) from 05/02/2021 in [facility] INPATIENT BEHAVIORAL MEDICINE  C-SSRS RISK CATEGORY No Risk No Risk No Risk       Last PHQ 2/9 Scores:     No data to display          Scribe for Treatment Team:  Harden Mo, LCSW 06/02/2022 2:45 PM

## 2022-06-02 NOTE — Progress Notes (Signed)
Patient had an uneventful day. Follows commands. Anxiety noted about discharge.  Pacing intermittently. Attended all groups. Denies SI/HI/AVH. But noted to be responding to internal stimuli. Tolerated all meals. Denies any pain or discomfort. Tolerated scheduled medications. Interacts with peers and staff.

## 2022-06-02 NOTE — BHH Counselor (Signed)
CSW attempted to reach Blanket at [facility], who has accepted patient.  CSW left HIPAA compliant voicemail requesting a return call.  [name], MSW, LCSW 06/02/2022 1:00 PM

## 2022-06-02 NOTE — Group Note (Signed)
Date:  06/02/2022 Time:  11:29 PM  Group Topic/Focus:  Building Self Esteem:   The Focus of this group is helping patients become aware of the effects of self-esteem on their lives, the things they and others do that enhance or undermine their self-esteem, seeing the relationship between their level of self-esteem and the choices they make and learning ways to enhance self-esteem.    Participation Level:  Active  Participation Quality:  Appropriate  Affect:  Appropriate  Cognitive:  Appropriate  Insight: Good  Engagement in Group:  Engaged  Modes of Intervention:  Discussion  Additional Comments:    Burt Ek 06/02/2022, 11:29 PM

## 2022-06-02 NOTE — Group Note (Signed)
[facility] LCSW Group Therapy Note    Group Date: 06/02/2022 Start Time: 1315 End Time: 1400  Type of Therapy and Topic:  Group Therapy:  Overcoming Obstacles  Participation Level:  BHH PARTICIPATION LEVEL: Active  Mood:  Description of Group:   In this group patients will be encouraged to explore what they see as obstacles to their own wellness and recovery. They will be guided to discuss their thoughts, feelings, and behaviors related to these obstacles. The group will process together ways to cope with barriers, with attention given to specific choices patients can make. Each patient will be challenged to identify changes they are motivated to make in order to overcome their obstacles. This group will be process-oriented, with patients participating in exploration of their own experiences as well as giving and receiving support and challenge from other group members.  Therapeutic Goals: 1. Patient will identify personal and current obstacles as they relate to admission. 2. Patient will identify barriers that currently interfere with their wellness or overcoming obstacles.  3. Patient will identify feelings, thought process and behaviors related to these barriers. 4. Patient will identify two changes they are willing to make to overcome these obstacles:    Summary of Patient Progress Patient was present in group.  Patient was an active participant at beginning of group. Patient stated that his obstacle was "going to AA".  Patient shared that he has not had a drink since 2017.  Patient then stated that the barrier to him overcoming his obstacle has been "paying child support".  CSW is under the understanding that the patient does not have children. Patient appears to be decompensating.   Therapeutic Modalities:   Cognitive Behavioral Therapy Solution Focused Therapy Motivational Interviewing Relapse Prevention Therapy   Harden Mo,  LCSW

## 2022-06-03 DIAGNOSIS — F250 Schizoaffective disorder, bipolar type: Secondary | ICD-10-CM | POA: Diagnosis not present

## 2022-06-03 MED ORDER — RISPERIDONE 1 MG PO TABS
3.0000 mg | ORAL_TABLET | ORAL | Status: DC
  Administered 2022-06-03 – 2022-06-05 (×4): 3 mg via ORAL
  Filled 2022-06-03 (×4): qty 3

## 2022-06-03 NOTE — Progress Notes (Signed)
Patient denies SI, HI, and AVH. He appears anxious and says he did not sleep well because he was excited and anxious to discharge. Patient is compliant with scheduled medications. He remains safe on the unit at this time.

## 2022-06-03 NOTE — Group Note (Unsigned)
Date:  06/03/2022 Time:  11:12 PM  Group Topic/Focus:  Stages of Change:   The focus of this group is to explain the stages of change and help patients identify changes they want to make upon discharge.     Participation Level:  {BHH PARTICIPATION LEVEL:22264}  Participation Quality:  {BHH PARTICIPATION QUALITY:22265}  Affect:  {BHH AFFECT:22266}  Cognitive:  {BHH COGNITIVE:22267}  Insight: {BHH Insight2:20797}  Engagement in Group:  {BHH ENGAGEMENT IN GROUP:22268}  Modes of Intervention:  {BHH MODES OF INTERVENTION:22269}  Additional Comments:  ***  [name] 06/03/2022, 11:12 PM  
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
## 2022-06-03 NOTE — Group Note (Signed)
Recreation Therapy Group Note   Group Topic:Coping Skills  Group Date: 06/03/2022 Start Time: 1400 End Time: 1440 Facilitators: Rosina Lowenstein, LRT, CTRS Location:  Dayroom  Group Description: Chair Yoga. LRT and patients discussed the benefits of yoga and how it differs from strength exercises. LRT educated patients on the mental and physical benefits of yoga and deep breathing and how it can be used as a Associate Professor. LRT and patients followed along to a guided yoga session on the television that focused on all parts of the body, as well as deep breathing. Pt encouraged to stop movement at any time if they feel discomfort or pain.   Goal Area(s) Addressed: Patient will practice using relaxation technique. Patient will identify a new coping skill.  Patient will follow multistep directions to reduce anxiety and stress.  Affect/Mood: Appropriate   Participation Level: Active and Engaged   Participation Quality: Independent   Behavior: Calm and Cooperative   Speech/Thought Process: Coherent   Insight: Good   Judgement: Good   Modes of Intervention: Activity   Patient Response to Interventions:  Receptive   Education Outcome:  Acknowledges education   Clinical Observations/Individualized Feedback: [name] was active in their participation of session activities and group discussion. Pt appropriately completed almost all of the yoga exercises. Pt was heard mumbling to himself during the session. Pt interacted well with LRT and peers duration of group.    Plan: Continue to engage patient in RT group sessions 2-3x/week.   [address], LRT, CTRS 06/03/2022 3:12 PM

## 2022-06-03 NOTE — Progress Notes (Signed)
   06/03/22 0615  15 Minute Checks  Location Dayroom  Visual Appearance Calm  Behavior Composed  Sleep (Behavioral Health Patients Only)  Calculate sleep? (Click Yes once per 24 hr at 0600 safety check) Yes  Documented sleep last 24 hours 3.75

## 2022-06-03 NOTE — Group Note (Signed)
Date:  06/03/2022 Time:  11:00 AM  Group Topic/Focus:  Mindfulness guided imagery Patients listened to mindfulness meditation music while sitting amongst themselves self reflecting in slience    Participation Level:  Active  Participation Quality:  Appropriate  Affect:  Appropriate  Cognitive:  Alert and Appropriate  Insight: Appropriate  Engagement in Group:  Engaged  Modes of Intervention:  Discussion  Additional Comments:    Mendel Binsfeld T Eshal Propps 06/03/2022, 11:00 AM  

## 2022-06-03 NOTE — Group Note (Signed)
LCSW Group Therapy Note   Group Date: 06/03/2022 Start Time: 1300 End Time: 1345   Type of Therapy and Topic:  Group Therapy: Worry and Anxiety/Depression  Participation Level:  Active  Description of Group: In this group, patients will be encouraged to explore their worry around what could happen vs what will happen. Each patient will be challenged to think of personal worries and how they will work their way through that worry around what will happen and what could happen. This group will be process-oriented, with patients participating in exploration of their own experiences as well as giving and receiving support and challenge from other group members.  Therapeutic Goals: Patient will identify personal worries that cause anxiety. Patient will identify clues to identify their worry. Patient will identify ways to handle their worry. Patient will discuss ways that their worry has deceased or why it has not decreased.   Summary of Patient Progress: Patient was present in group.  Patient was clear and on topic throughout group, which has been an improvement from previous groups.  Patient showed clear and linear thinking with clear thought processes.  However, towards the end the patient did begin to speak of attempting to adopt a little girl when discussing fostering relationships to improve happiness.  Patient may have tangentially linked the word "fostering" with foster care.   Therapeutic Modalities: Cognitive Behavioral Therapy Solution Focused Therapy Motivational Interviewing    Harden Mo, [place] 06/03/2022  2:07 PM

## 2022-06-04 DIAGNOSIS — F250 Schizoaffective disorder, bipolar type: Secondary | ICD-10-CM | POA: Diagnosis not present

## 2022-06-04 NOTE — Plan of Care (Signed)
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
## 2022-06-04 NOTE — Progress Notes (Signed)
Patient complaint with medication verbalizing excitement about his discharge in the morning.   Patient notified to inform staff with problems or concerns. No adverse drug reactions noted. Patient contracts for safety at this time. Will continue to monitor.

## 2022-06-04 NOTE — Group Note (Signed)
Date:  06/04/2022 Time:  11:01 AM  Group Topic/Focus:  Personal Choices and Values:   The focus of this group is to help patients assess and explore the importance of values in their lives, how their values affect their decisions, how they express their values and what opposes their expression. Music group selection.   Participation Level:  Active  Participation Quality:  Appropriate  Affect:  Appropriate  Cognitive:  Alert and Appropriate  Insight: Appropriate  Engagement in Group:  Engaged  Modes of Intervention:  Activity and Discussion  Additional Comments:    [name] 06/04/2022, 11:01 AM

## 2022-06-04 NOTE — Group Note (Signed)
LCSW Group Therapy Note  Group Date: 06/04/2022 Start Time: 1300 End Time: 1400   Type of Therapy and Topic:  Group Therapy - How To Cope with Nervousness about Discharge   Participation Level:  Active   Description of Group This process group involved identification of patients' feelings about discharge. Some of them are scheduled to be discharged soon, while others are new admissions, but each of them was asked to share thoughts and feelings surrounding discharge from the hospital. One common theme was that they are excited at the prospect of going home, while another was that many of them are apprehensive about sharing why they were hospitalized. Patients were given the opportunity to discuss these feelings with their peers in preparation for discharge.  Therapeutic Goals  Patient will identify their overall feelings about pending discharge. Patient will think about how they might proactively address issues that they believe will once again arise once they get home (i.e. with parents). Patients will participate in discussion about having hope for change.   Summary of Patient Progress:   Patient was present for the entirety of the group session. Patient was an active listener and participated in the topic of discussion, provided helpful advice to others, and added nuance to topic of conversation. Patient was primarily concerned about the details of his new assisted living to which he is scheduled to discharge to on 5/30. Asked many questions pertaining to daily living and routines.   Therapeutic Modalities Cognitive Behavioral Therapy   Almedia Balls 06/04/2022  3:36 PM

## 2022-06-04 NOTE — Group Note (Signed)
Recreation Therapy Group Note   Group Topic:Relaxation  Group Date: 06/04/2022 Start Time: 1400 End Time: 1445 Facilitators: Rosina Lowenstein, LRT, CTRS Location:  Dayroom and Courtyard  Group Description: Meditation. LRT asks patients their current level of stress/anxiety from 1-10, with 10 being the highest. LRT educated on the benefits of mindfulness and how it can apply to everyday life post-discharge. LRT and pt's followed along to an audio script of a "guided meditation" video. LRT asked pt their level of stress and anxiety once the prompt was finished. LRT facilitated post-activity processing to gain feedback on session. Patients then had the option to go outside to the courtyard for fresh air and sunlight.   Goal Area(s) Addressed:  Patient will practice using relaxation technique. Patient will identify a new coping skill.  Patient will follow multistep directions to reduce anxiety and stress.  Affect/Mood: Appropriate   Participation Level: Active and Engaged   Participation Quality: Independent   Behavior: Calm and Cooperative   Speech/Thought Process: Coherent   Insight: Good   Judgement: Good   Modes of Intervention: Activity   Patient Response to Interventions:  Receptive   Education Outcome:  Acknowledges education   Clinical Observations/Individualized Feedback: [name] was active in their participation of session activities and group discussion. Pt identified that his stress and anxiety levels were a 1 before and after the session. Pt chose to go outside after for fresh air and sunlight. Pt interacted well with LRT and peers duration of session.    Plan: Continue to engage patient in RT group sessions 2-3x/week.   Rosina Lowenstein, LRT, CTRS 06/04/2022 2:52 PM

## 2022-06-04 NOTE — Progress Notes (Signed)
   06/03/22 2100  Psych Admission Type (Psych Patients Only)  Admission Status Voluntary  Psychosocial Assessment  Patient Complaints None  Eye Contact Fair  Facial Expression Animated  Affect Labile  Speech Rapid;Pressured  Interaction Assertive  Motor Activity Slow  Appearance/Hygiene Unremarkable  Behavior Characteristics Cooperative  Mood Pleasant  Aggressive Behavior  Effect No apparent injury  Thought Process  Coherency Disorganized  Content Preoccupation  Delusions None reported or observed  Perception WDL  Hallucination None reported or observed  Judgment Impaired  Confusion Mild  Danger to Self  Current suicidal ideation? Denies  Danger to Others  Danger to Others None reported or observed

## 2022-06-04 NOTE — Progress Notes (Signed)
[facility] LCSW Progress Note   06/04/2022    1:32 PM  ALOISE SHELLMAN   [phone]   Type of Contact and Topic: Care Coordination   CSW contacted [facility] w. [facility] regarding discharge plans for 06/05/2022. Facility is agreeable to Designer, fashion/clothing to facility at [address] [place], [place] [address]. [facility] on [address] in [place] to fill medications. No further action.    Signed:  Corky Crafts, MSW, LCSW, LCAS 06/04/2022 1:32 PM

## 2022-06-04 NOTE — Group Note (Signed)
Date:  06/04/2022 Time:  10:13 PM  Group Topic/Focus:  Building Self Esteem:   The Focus of this group is helping patients become aware of the effects of self-esteem on their lives, the things they and others do that enhance or undermine their self-esteem, seeing the relationship between their level of self-esteem and the choices they make and learning ways to enhance self-esteem. Coping With Mental Health Crisis:   The purpose of this group is to help patients identify strategies for coping with mental health crisis.  Group discusses possible causes of crisis and ways to manage them effectively. Developing a Wellness Toolbox:   The focus of this group is to help patients develop a "wellness toolbox" with skills and strategies to promote recovery upon discharge. Goals Group:   The focus of this group is to help patients establish daily goals to achieve during treatment and discuss how the patient can incorporate goal setting into their daily lives to aide in recovery. Healthy Communication:   The focus of this group is to discuss communication, barriers to communication, as well as healthy ways to communicate with others. Identifying Needs:   The focus of this group is to help patients identify their personal needs that have been historically problematic and identify healthy behaviors to address their needs. Making Healthy Choices:   The focus of this group is to help patients identify negative/unhealthy choices they were using prior to admission and identify positive/healthier coping strategies to replace them upon discharge. Managing Feelings:   The focus of this group is to identify what feelings patients have difficulty handling and develop a plan to handle them in a healthier way upon discharge. Overcoming Stress:   The focus of this group is to define stress and help patients assess their triggers. Personal Choices and Values:   The focus of this group is to help patients assess and explore the  importance of values in their lives, how their values affect their decisions, how they express their values and what opposes their expression. Rediscovering Joy:   The focus of this group is to explore various ways to relieve stress in a positive manner. Self Care:   The focus of this group is to help patients understand the importance of self-care in order to improve or restore emotional, physical, spiritual, interpersonal, and financial health.    Participation Level:  Active  Participation Quality:  Appropriate, Sharing, and Supportive  Affect:  Appropriate  Cognitive:  Alert, Appropriate, and Oriented  Insight: Appropriate, Good, and Improving  Engagement in Group:  Developing/Improving, Engaged, Improving, and Supportive  Modes of Intervention:  Activity and Support  Additional Comments:    [name] 06/04/2022, 10:13 PM

## 2022-06-05 DIAGNOSIS — F250 Schizoaffective disorder, bipolar type: Secondary | ICD-10-CM | POA: Diagnosis not present

## 2022-06-05 MED ORDER — CARBAMAZEPINE ER 400 MG PO TB12: 400.0000 mg | ORAL_TABLET | Freq: Every day | ORAL | 3 refills | Status: DC

## 2022-06-05 MED ORDER — LOSARTAN POTASSIUM 100 MG PO TABS: 100.0000 mg | ORAL_TABLET | Freq: Every day | ORAL | 3 refills | Status: DC

## 2022-06-05 MED ORDER — AMLODIPINE BESYLATE 5 MG PO TABS: 5.0000 mg | ORAL_TABLET | Freq: Every day | ORAL | 1 refills | Status: DC

## 2022-06-05 MED ORDER — FLUTICASONE FUROATE-VILANTEROL 200-25 MCG/ACT IN AEPB: 1.0000 | INHALATION_SPRAY | Freq: Every day | RESPIRATORY_TRACT | 1 refills | Status: DC

## 2022-06-05 MED ORDER — ATORVASTATIN CALCIUM 40 MG PO TABS: 40.0000 mg | ORAL_TABLET | Freq: Every day | ORAL | 1 refills | Status: DC

## 2022-06-05 MED ORDER — NICOTINE 14 MG/24HR TD PT24: 14.0000 mg | MEDICATED_PATCH | Freq: Every day | TRANSDERMAL | 0 refills | Status: DC

## 2022-06-05 MED ORDER — RISPERIDONE 3 MG PO TABS: 3.0000 mg | ORAL_TABLET | ORAL | 3 refills | Status: DC

## 2022-06-05 MED ORDER — AMANTADINE HCL 100 MG PO CAPS: 100.0000 mg | ORAL_CAPSULE | Freq: Two times a day (BID) | ORAL | 3 refills | Status: DC

## 2022-06-05 MED ORDER — TRAZODONE HCL 100 MG PO TABS: 100.0000 mg | ORAL_TABLET | Freq: Every evening | ORAL | 3 refills | Status: DC | PRN
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
## 2022-06-05 NOTE — Progress Notes (Signed)
Patient pleasant and cooperative.  Animated affect.  Denies anxiety, depression, SI, HI and AVH.  Patient reports he slept well.  Good appetite.  Present in the milieu.  Appropriate interaction with peers. Compliant with scheduled medication.  15 min checks in place for safety.  Patient is excited to be discharging today.

## 2022-06-05 NOTE — Progress Notes (Signed)
Discharge Note:  Patient denies SI/HI/AVH at this time. Discharge instructions, AVS, prescriptions, and transition record reviewed with patient. Patient agrees to comply with medication management, follow-up visit, and outpatient therapy. Patient belongings returned to patient. Patient questions and concerns addressed and answered. Patient ambulatory off unit. Patient discharged to [facility].

## 2022-06-05 NOTE — [facility] Suicide Risk Assessment (Signed)
[facility] Discharge Suicide Risk Assessment   Principal Problem: Schizoaffective disorder, bipolar type [facility]) Discharge Diagnoses: Principal Problem:   Schizoaffective disorder, bipolar type (HCC)   Total Time spent with patient: 1 hour  Musculoskeletal: Strength & Muscle Tone: within normal limits Gait & Station: normal Patient leans: N/A  Psychiatric Specialty Exam  Presentation  General Appearance: No data recorded Eye Contact:No data recorded Speech:No data recorded Speech Volume:No data recorded Handedness:No data recorded  Mood and Affect  Mood:No data recorded Duration of Depression Symptoms: No data recorded Affect:No data recorded  Thought Process  Thought Processes:No data recorded Descriptions of Associations:No data recorded Orientation:No data recorded Thought Content:No data recorded History of Schizophrenia/Schizoaffective disorder:No data recorded Duration of Psychotic Symptoms:No data recorded Hallucinations:No data recorded Ideas of Reference:No data recorded Suicidal Thoughts:No data recorded Homicidal Thoughts:No data recorded  Sensorium  Memory:No data recorded Judgment:No data recorded Insight:No data recorded  Executive Functions  Concentration:No data recorded Attention Span:No data recorded Recall:No data recorded Fund of Knowledge:No data recorded Language:No data recorded  Psychomotor Activity  Psychomotor Activity:No data recorded  Assets  Assets:No data recorded  Sleep  Sleep:No data recorded   Blood pressure (!) 153/65, pulse (!) 50, temperature 99 F (37.2 C), resp. rate 14, height 5\' 10"  (1.778 m), weight 66.7 kg, SpO2 99 %. Body mass index is 21.09 kg/m.  Mental Status Per Nursing Assessment::   On Admission:  NA  Demographic Factors:  Male  Loss Factors: NA  Historical Factors: NA  Risk Reduction Factors:   NA  Continued Clinical Symptoms: Schizoaffective disorder, bipolar type   Cognitive Features That  Contribute To Risk:  None    Suicide Risk:  Minimal: No identifiable suicidal ideation.  Patients presenting with no risk factors but with morbid ruminations; may be classified as minimal risk based on the severity of the depressive symptoms    Plan Of Care/Follow-up recommendations: See SW Note   Sarina Ill, DO 06/05/2022, 10:22 AM

## 2022-06-05 NOTE — Care Management Important Message (Signed)
Important Message  Patient Details  Name: [name] MRN: [id number] Date of Birth: 1960/02/28   Medicare Important Message Given:  Yes  Patient's guardian informed of right to appeal discharge on 06/04/2022 at 1300, provided phone number to [facility]. Patient expressed no interest in appealing discharge at this time. CSW will continue to monitor situation.  Corky Crafts, LCSWA 06/05/2022, 10:36 AM

## 2022-06-05 NOTE — Progress Notes (Signed)
  [facility] Adult Case Management Discharge Plan :  Will you be returning to the same living situation after discharge:  No. Patient accepted to [facility].  At discharge, do you have transportation home?: Yes,  [facility] to provide [facility] to hospital.  Do you have the ability to pay for your medications: Yes,  Medicare Part A and B  Release of information consent forms completed and in the chart;  Patient's signature needed at discharge.  Patient to Follow up at:  Follow-up Information     [facility] Follow up.   Why: You will be seen by psychiatry and therapy contracted with the assisted living living facility. Contact information: [address]  [place], [place] [address]               Patient does not require home health, DME, or any other physical therapy intervention per Alvera Novel, PT see note dated 05/14/22.   "Reason Eval/Treat Not Completed: PT screened, no needs identified, will sign off (Per RN, pt's mobility continue to improve in safety and independence,). Pt performing transfer, AMB in hallway, turning in place, all performed at independent level. No sway or LOB noted as previously seen. Pt reports to feel generally good about his mobility, denies any concerns. No skilled PT services needed at this time. Pt mobilizing on unit independently, ad lib, and frequently. Will sign off at this time. "  Next level of care provider has access to [facility] Link:no  Safety Planning and Suicide Prevention discussed: Yes,  SPE completed with patient by nursing staff, CSW completed SPE with [facility] Horald Pollen [phone]    Has patient been referred to the Quitline?: Patient refused referral for treatment Tobacco Use: High Risk (05/07/2022)   Patient History    Smoking Tobacco Use: Every Day    Smokeless Tobacco Use: Never    Passive Exposure: Not on file   Patient has been referred for addiction treatment: No known substance  use disorder. Social History   Substance and Sexual Activity  Alcohol Use No   Social History   Substance and Sexual Activity  Drug Use No  Drugs of Abuse     Component Value Date/Time   LABOPIA NONE DETECTED 05/02/2022 1514   COCAINSCRNUR NONE DETECTED 05/02/2022 1514   LABBENZ NONE DETECTED 05/02/2022 1514   AMPHETMU NONE DETECTED 05/02/2022 1514   THCU NONE DETECTED 05/02/2022 1514   LABBARB NONE DETECTED 05/02/2022 1514    Jacqulyn Ducking [place], LCSWA 06/05/2022, 10:31 AM

## 2022-06-05 NOTE — Plan of Care (Signed)
  Problem: Education: Goal: Knowledge of General Education information will improve Description: Including pain rating scale, medication(s)/side effects and non-pharmacologic comfort measures Outcome: Progressing   Problem: Activity: Goal: Risk for activity intolerance will decrease Outcome: Progressing   Problem: Nutrition: Goal: Adequate nutrition will be maintained Outcome: Progressing   Problem: Coping: Goal: Level of anxiety will decrease Outcome: Progressing   

## 2022-08-06 ENCOUNTER — Encounter (HOSPITAL_COMMUNITY): Payer: Self-pay | Admitting: Emergency Medicine

## 2022-08-06 ENCOUNTER — Other Ambulatory Visit: Payer: Self-pay

## 2022-08-06 ENCOUNTER — Emergency Department (HOSPITAL_COMMUNITY)
Admission: EM | Admit: 2022-08-06 | Discharge: 2022-08-10 | Disposition: A | Discharge status: Other Acute Inpatient Hospital | Payer: Medicare Other | Attending: Emergency Medicine | Admitting: Emergency Medicine

## 2022-08-06 DIAGNOSIS — F22 Delusional disorders: Secondary | ICD-10-CM | POA: Insufficient documentation

## 2022-08-06 DIAGNOSIS — Z79899 Other long term (current) drug therapy: Secondary | ICD-10-CM | POA: Insufficient documentation

## 2022-08-06 DIAGNOSIS — F250 Schizoaffective disorder, bipolar type: Secondary | ICD-10-CM

## 2022-08-06 DIAGNOSIS — R456 Violent behavior: Secondary | ICD-10-CM | POA: Diagnosis present

## 2022-08-06 DIAGNOSIS — R4182 Altered mental status, unspecified: Secondary | ICD-10-CM | POA: Insufficient documentation

## 2022-08-06 HISTORY — DX: Schizoaffective disorder, bipolar type: F25.0

## 2022-08-06 LAB — CBC WITH DIFFERENTIAL/PLATELET
Abs Immature Granulocytes: 0.02 10*3/uL (ref 0.00–0.07)
Basophils Absolute: 0 10*3/uL (ref 0.0–0.1)
Basophils Relative: 0 %
Eosinophils Absolute: 0.2 10*3/uL (ref 0.0–0.5)
Eosinophils Relative: 3 %
HCT: 39.9 % (ref 39.0–52.0)
Hemoglobin: 12.5 g/dL — ABNORMAL LOW (ref 13.0–17.0)
Immature Granulocytes: 0 %
Lymphocytes Relative: 39 %
Lymphs Abs: 2.4 10*3/uL (ref 0.7–4.0)
MCH: 32.5 pg (ref 26.0–34.0)
MCHC: 31.3 g/dL (ref 30.0–36.0)
MCV: 103.6 fL — ABNORMAL HIGH (ref 80.0–100.0)
Monocytes Absolute: 0.8 10*3/uL (ref 0.1–1.0)
Monocytes Relative: 13 %
Neutro Abs: 2.7 10*3/uL (ref 1.7–7.7)
Neutrophils Relative %: 45 %
Platelets: 197 10*3/uL (ref 150–400)
RBC: 3.85 MIL/uL — ABNORMAL LOW (ref 4.22–5.81)
RDW: 12.2 % (ref 11.5–15.5)
WBC: 6.1 10*3/uL (ref 4.0–10.5)
nRBC: 0 % (ref 0.0–0.2)

## 2022-08-06 LAB — RAPID URINE DRUG SCREEN, HOSP PERFORMED
Amphetamines: NOT DETECTED
Barbiturates: NOT DETECTED
Benzodiazepines: POSITIVE — AB
Cocaine: NOT DETECTED
Opiates: NOT DETECTED
Tetrahydrocannabinol: NOT DETECTED

## 2022-08-06 LAB — URINALYSIS, W/ REFLEX TO CULTURE (INFECTION SUSPECTED)
Bacteria, UA: NONE SEEN
Bilirubin Urine: NEGATIVE
Glucose, UA: NEGATIVE mg/dL
Hgb urine dipstick: NEGATIVE
Ketones, ur: NEGATIVE mg/dL
Leukocytes,Ua: NEGATIVE
Nitrite: NEGATIVE
Protein, ur: NEGATIVE mg/dL
Specific Gravity, Urine: 1.005 (ref 1.005–1.030)
pH: 6 (ref 5.0–8.0)

## 2022-08-06 LAB — COMPREHENSIVE METABOLIC PANEL
ALT: 16 U/L (ref 0–44)
AST: 17 U/L (ref 15–41)
Albumin: 3.9 g/dL (ref 3.5–5.0)
Alkaline Phosphatase: 67 U/L (ref 38–126)
Anion gap: 10 (ref 5–15)
BUN: 11 mg/dL (ref 6–20)
CO2: 20 mmol/L — ABNORMAL LOW (ref 22–32)
Calcium: 8.6 mg/dL — ABNORMAL LOW (ref 8.9–10.3)
Chloride: 109 mmol/L (ref 98–111)
Creatinine, Ser: 0.88 mg/dL (ref 0.61–1.24)
GFR, Estimated: >60 mL/min (ref >60)
Glucose, Bld: 107 mg/dL — ABNORMAL HIGH (ref 70–99)
Potassium: 2.9 mmol/L — ABNORMAL LOW (ref 3.5–5.1)
Sodium: 139 mmol/L (ref 135–145)
Total Bilirubin: 0.8 mg/dL (ref 0.3–1.2)
Total Protein: 6.9 g/dL (ref 6.5–8.1)

## 2022-08-06 LAB — ETHANOL: Alcohol, Ethyl (B): <10 mg/dL (ref <10)

## 2022-08-06 MED ORDER — MIDAZOLAM HCL 2 MG/2ML IJ SOLN
2.0000 mg | Freq: Once | INTRAMUSCULAR | Status: AC
  Administered 2022-08-06: 2 mg via INTRAMUSCULAR
  Filled 2022-08-06: qty 2

## 2022-08-06 MED ORDER — RISPERIDONE 1 MG PO TABS
3.0000 mg | ORAL_TABLET | ORAL | Status: DC
  Administered 2022-08-06 – 2022-08-10 (×9): 3 mg via ORAL
  Filled 2022-08-06 (×9): qty 3

## 2022-08-06 MED ORDER — TRAZODONE HCL 50 MG PO TABS
100.0000 mg | ORAL_TABLET | Freq: Every evening | ORAL | Status: DC | PRN
  Administered 2022-08-08 – 2022-08-09 (×2): 100 mg via ORAL
  Filled 2022-08-06 (×3): qty 2

## 2022-08-06 MED ORDER — NICOTINE 21 MG/24HR TD PT24
21.0000 mg | MEDICATED_PATCH | Freq: Every day | TRANSDERMAL | Status: DC
  Administered 2022-08-09 – 2022-08-10 (×2): 21 mg via TRANSDERMAL
  Filled 2022-08-06 (×5): qty 1

## 2022-08-06 MED ORDER — ZIPRASIDONE MESYLATE 20 MG IM SOLR
20.0000 mg | Freq: Once | INTRAMUSCULAR | Status: AC
  Administered 2022-08-06: 20 mg via INTRAMUSCULAR
  Filled 2022-08-06: qty 20

## 2022-08-06 MED ORDER — STERILE WATER FOR INJECTION IJ SOLN
INTRAMUSCULAR | Status: AC
  Administered 2022-08-06: 1.2 mL via INTRAMUSCULAR
  Filled 2022-08-06: qty 10

## 2022-08-06 MED ORDER — FLUTICASONE FUROATE-VILANTEROL 200-25 MCG/ACT IN AEPB
1.0000 | INHALATION_SPRAY | Freq: Every day | RESPIRATORY_TRACT | Status: DC
  Administered 2022-08-06 – 2022-08-10 (×5): 1 via RESPIRATORY_TRACT
  Filled 2022-08-06 (×2): qty 28

## 2022-08-06 MED ORDER — ATORVASTATIN CALCIUM 40 MG PO TABS
40.0000 mg | ORAL_TABLET | Freq: Every day | ORAL | Status: DC
  Administered 2022-08-07 – 2022-08-09 (×3): 40 mg via ORAL
  Filled 2022-08-06 (×4): qty 1

## 2022-08-06 MED ORDER — LOSARTAN POTASSIUM 25 MG PO TABS
100.0000 mg | ORAL_TABLET | Freq: Every day | ORAL | Status: DC
  Administered 2022-08-06 – 2022-08-10 (×5): 100 mg via ORAL
  Filled 2022-08-06 (×5): qty 4

## 2022-08-06 MED ORDER — AMLODIPINE BESYLATE 5 MG PO TABS
5.0000 mg | ORAL_TABLET | Freq: Every day | ORAL | Status: DC
  Administered 2022-08-06 – 2022-08-10 (×5): 5 mg via ORAL
  Filled 2022-08-06 (×5): qty 1

## 2022-08-06 MED ORDER — CARBAMAZEPINE ER 400 MG PO TB12
400.0000 mg | ORAL_TABLET | Freq: Every day | ORAL | Status: DC
  Administered 2022-08-07 – 2022-08-09 (×3): 400 mg via ORAL
  Filled 2022-08-06 (×5): qty 1

## 2022-08-06 MED ORDER — AMANTADINE HCL 100 MG PO CAPS
100.0000 mg | ORAL_CAPSULE | Freq: Two times a day (BID) | ORAL | Status: DC
  Administered 2022-08-06 – 2022-08-10 (×8): 100 mg via ORAL
  Filled 2022-08-06 (×9): qty 1

## 2022-08-06 MED ORDER — POTASSIUM CHLORIDE CRYS ER 20 MEQ PO TBCR
40.0000 meq | EXTENDED_RELEASE_TABLET | ORAL | Status: AC
  Administered 2022-08-06 (×2): 40 meq via ORAL
  Filled 2022-08-06 (×2): qty 2

## 2022-08-06 NOTE — ED Notes (Signed)
Monitoring equipment removed from pt and put away

## 2022-08-06 NOTE — ED Notes (Signed)
Pt given coffee. °

## 2022-08-06 NOTE — ED Notes (Signed)
Pt crying for his mom saying "he wants to go home".

## 2022-08-06 NOTE — BH Assessment (Addendum)
Clinician made referral to [facility].  [facility] w/ [facility] said that Dr. Elesa Hacker will be able to see patient at 22:45.  All appropriate people were added to the secure messaging.

## 2022-08-06 NOTE — ED Notes (Signed)
Pt taken to the bathroom to dress out. Pt unable to provide a urine sample at this time

## 2022-08-06 NOTE — ED Provider Notes (Signed)
[place] EMERGENCY DEPARTMENT AT [facility] Provider Note   CSN: [id number] Arrival date & time: 08/06/22  0241     History  No chief complaint on file.   [name] is a 62 y.o. male.  Presents to the emergency department under involuntary commitment.  Patient with history of schizoaffective disorder, bipolar type.  He is maintained on multiple medications but is refusing all medications currently.  He is here because he has been agitated, aggressive and running away from the group home.       Home Medications Prior to Admission medications   Medication Sig Start Date End Date Taking? Authorizing Provider  amantadine (SYMMETREL) 100 MG capsule Take 1 capsule (100 mg total) by mouth 2 (two) times daily. 06/05/22   Dustin Ill, DO  amLODipine (NORVASC) 5 MG tablet Take 1 tablet (5 mg total) by mouth daily. 06/05/22   Dustin Ill, DO  atorvastatin (LIPITOR) 40 MG tablet Take 1 tablet (40 mg total) by mouth at bedtime. 06/05/22   Dustin Ill, DO  carbamazepine (TEGRETOL XR) 400 MG 12 hr tablet Take 1 tablet (400 mg total) by mouth at bedtime. 06/05/22   Dustin Ill, DO  fluticasone furoate-vilanterol (BREO ELLIPTA) 200-25 MCG/ACT AEPB Inhale 1 puff into the lungs daily. 06/05/22   Dustin Ill, DO  losartan (COZAAR) 100 MG tablet Take 1 tablet (100 mg total) by mouth daily. 06/06/22   Dustin Ill, DO  nicotine (NICODERM CQ - DOSED IN MG/24 HOURS) 14 mg/24hr patch Place 1 patch (14 mg total) onto the skin daily. 06/06/22   Dustin Ill, DO  risperiDONE (RISPERDAL) 3 MG tablet Take 1 tablet (3 mg total) by mouth 2 (two) times daily at 8 am and 4 pm. 06/05/22   Dustin Ill, DO  traZODone (DESYREL) 100 MG tablet Take 1 tablet (100 mg total) by mouth at bedtime as needed for sleep. 06/05/22   Dustin Ill, DO      Allergies    Haldol [haloperidol]    Review of Systems    Review of Systems  Physical Exam Updated Vital Signs BP (!) 166/91 (BP Location: Right Arm)   Pulse (!) 112   Temp 98.7 F (37.1 C) (Oral)   Resp 20   Ht 5\' 10"  (1.778 m)   Wt 70 kg   SpO2 94%   BMI 22.14 kg/m  Physical Exam Vitals and nursing note reviewed.  Constitutional:      General: He is not in acute distress.    Appearance: He is well-developed.  HENT:     Head: Normocephalic and atraumatic.     Mouth/Throat:     Mouth: Mucous membranes are moist.  Eyes:     General: Vision grossly intact. Gaze aligned appropriately.     Extraocular Movements: Extraocular movements intact.     Conjunctiva/sclera: Conjunctivae normal.  Cardiovascular:     Rate and Rhythm: Normal rate and regular rhythm.     Pulses: Normal pulses.     Heart sounds: Normal heart sounds, S1 normal and S2 normal. No murmur heard.    No friction rub. No gallop.  Pulmonary:     Effort: Pulmonary effort is normal. No respiratory distress.     Breath sounds: Normal breath sounds.  Abdominal:     Palpations: Abdomen is soft.     Tenderness: There is no abdominal tenderness. There is no guarding or rebound.     Hernia: No hernia is present.  Musculoskeletal:        General: No swelling.     Cervical back: Full passive range of motion without pain, normal range of motion and neck supple. No pain with movement, spinous process tenderness or muscular tenderness. Normal range of motion.     Right lower leg: No edema.     Left lower leg: No edema.  Skin:    General: Skin is warm and dry.     Capillary Refill: Capillary refill takes less than 2 seconds.     Findings: No ecchymosis, erythema, lesion or wound.  Neurological:     Mental Status: He is alert and oriented to person, place, and time.     GCS: GCS eye subscore is 4. GCS verbal subscore is 5. GCS motor subscore is 6.     Cranial Nerves: Cranial nerves 2-12 are intact.     Sensory: Sensation is intact.     Motor: Motor function is intact. No  weakness or abnormal muscle tone.     Coordination: Coordination is intact.  Psychiatric:        Mood and Affect: Affect is inappropriate.        Speech: Speech is rapid and pressured and tangential.        Behavior: Behavior is agitated.        Thought Content: Thought content is paranoid and delusional.     ED Results / Procedures / Treatments   Labs (all labs ordered are listed, but only abnormal results are displayed) Labs Reviewed  CBC WITH DIFFERENTIAL/PLATELET  COMPREHENSIVE METABOLIC PANEL  ETHANOL  RAPID URINE DRUG SCREEN, HOSP PERFORMED  URINALYSIS, W/ REFLEX TO CULTURE (INFECTION SUSPECTED)    EKG None  Radiology No results found.  Procedures Procedures    Medications Ordered in ED Medications  ziprasidone (GEODON) injection 20 mg (has no administration in time range)  midazolam (VERSED) injection 2 mg (has no administration in time range)    ED Course/ Medical Decision Making/ A&P                                 Medical Decision Making Amount and/or Complexity of Data Reviewed Labs: ordered.  Risk Prescription drug management.   Sent to the ED under involuntary commitment.  Patient refusing his psychiatric medications and is agitated.  Patient continuously talking, stream of consciousness flight of ideas.  He is delusional, thinks he is [name].  He will not answer questions appropriately.  He needs chemical sedation and psychiatric treatment.        Final Clinical Impression(s) / ED Diagnoses Final diagnoses:  Schizoaffective disorder, bipolar type [facility])    Rx / DC Orders ED Discharge Orders     None         Denean Pavon, Canary Brim, MD 08/06/22 0301

## 2022-08-06 NOTE — ED Notes (Signed)
O2 sat at 89% while asleep. Pt placed on 2L [place] for comfort. O2 sat at 94%

## 2022-08-06 NOTE — ED Notes (Signed)
After pt placed all tablets in his mouth, pt then to trashcan and spit them all out.

## 2022-08-06 NOTE — ED Notes (Signed)
Pt laying in bed talking to himself. Pt speech is pressured and pt with flight of ideas. Pt called this RN to room and stated he wanted to talk to psych because he doesn't know his birthdate, his parents names or his name. States he only knows his name is [name]. Pt informed psych would talk to him shortly. Pt laid back in bed and continued to talk to himself.
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
## 2022-08-06 NOTE — Consult Note (Signed)
[facility] Consult Note  Patient Name: [name] MRN: [id number] DOB: [birth year] DATE OF Consult: 08/06/2022  PRIMARY PSYCHIATRIC DIAGNOSES  1.  Schizoaffective Disorder, Bipolar Type 2.   3.    RECOMMENDATIONS  Recommendations: Medication recommendations: Continue home medications Non-Medication/therapeutic recommendations: Inpatient psychiatric admission Is inpatient psychiatric hospitalization recommended for this patient? Yes (Explain why): for pt and staff safety  From a psychiatric perspective, is this patient appropriate for discharge to an outpatient setting/resource or other less restrictive environment for continued care?  No (Explain why): possible safety risk Follow-Up Telepsychiatry C/L services: We will sign off for now. Please re-consult our service if needed for any concerning changes in the patient's condition, discharge planning, or questions. Communication: Treatment team members (and family members if applicable) who were involved in treatment/care discussions and planning, and with whom we spoke or engaged with via secure text/chat, include the following: Randall Hiss, RN;  Dr. Durwin Nora; Selena Batten, RN; Caprice Renshaw, [facility]  Thank you for involving [place] in the care of this patient. If you have any additional questions or concerns, please call [phone] and ask for me or the provider on-call.  TELEPSYCHIATRY ATTESTATION & CONSENT  As the provider for this telehealth consult, I attest that I verified the patient's identity using two separate identifiers, introduced myself to the patient, provided my credentials, disclosed my location, and performed this encounter via a HIPAA-compliant, real-time, face-to-face, two-way, interactive audio and video platform and with the full consent and agreement of the patient (or guardian as applicable.)  Patient physical location: [facility]. Telehealth provider physical location: home office in state of [place].  Video  start time: 2016 (Central Time) Video end time: 2037 [facility] Time)  IDENTIFYING DATA  [name] is a 62 y.o. year-old male for whom a psychiatric consultation has been ordered by the primary provider. The patient was identified using two separate identifiers.  CHIEF COMPLAINT/REASON FOR CONSULT  Increased aggression, medication non-compliance, delusional thinking  HISTORY OF PRESENT ILLNESS (HPI)  The patient presented to the ED on an IVC from his group home due to non-compliance with medications and increased aggression.  Upon evaluation, pt reported he was at the hospital because "I had something wrong with my foot".  Reported he has MS.  When questioned about his compliance with medications and aggression at the group home, he reported he has been taking his medications.  Reported they said he was threatening a girl but he denied doing this.  Presented with flight of ideas and delusional thinking with some loose associations as well.  Reported he wants to go back to a previous ALF he was living in when he was involved with [facility].  He fluctuated back and forth from saying he has been compliant with his medications to saying he couldn't take all of his medications because they cause him to fall.  Pt would benefit from inpatient psychiatric admission at this time for further evaluation and stabilization.     PAST PSYCHIATRIC HISTORY   Otherwise as per HPI above.  PAST MEDICAL HISTORY  Past Medical History:  Diagnosis Date   Hypertension    Schizoaffective disorder, bipolar type (HCC)    Schizophrenia (HCC)      HOME MEDICATIONS  Facility Ordered Medications  Medication   [COMPLETED] ziprasidone (GEODON) injection 20 mg   [COMPLETED] midazolam (VERSED) injection 2 mg   [COMPLETED] sterile water (preservative free) injection   [COMPLETED] potassium chloride SA (KLOR-CON M) CR tablet 40 mEq   amantadine (SYMMETREL)  capsule 100 mg   amLODipine (NORVASC) tablet 5 mg   atorvastatin  (LIPITOR) tablet 40 mg   carbamazepine (TEGRETOL XR) 12 hr tablet 400 mg   fluticasone furoate-vilanterol (BREO ELLIPTA) 200-25 MCG/ACT 1 puff   losartan (COZAAR) tablet 100 mg   risperiDONE (RISPERDAL) tablet 3 mg   traZODone (DESYREL) tablet 100 mg   nicotine (NICODERM CQ - dosed in mg/24 hours) patch 21 mg   PTA Medications  Medication Sig   losartan (COZAAR) 100 MG tablet Take 1 tablet (100 mg total) by mouth daily.   risperiDONE (RISPERDAL) 3 MG tablet Take 1 tablet (3 mg total) by mouth 2 (two) times daily at 8 am and 4 pm.   carbamazepine (TEGRETOL XR) 400 MG 12 hr tablet Take 1 tablet (400 mg total) by mouth at bedtime.   amLODipine (NORVASC) 5 MG tablet Take 1 tablet (5 mg total) by mouth daily.   atorvastatin (LIPITOR) 40 MG tablet Take 1 tablet (40 mg total) by mouth at bedtime.   amantadine (SYMMETREL) 100 MG capsule Take 1 capsule (100 mg total) by mouth 2 (two) times daily.   fluticasone furoate-vilanterol (BREO ELLIPTA) 200-25 MCG/ACT AEPB Inhale 1 puff into the lungs daily.   traZODone (DESYREL) 100 MG tablet Take 1 tablet (100 mg total) by mouth at bedtime as needed for sleep. (Patient not taking: Reported on 08/06/2022)     ALLERGIES  Allergies  Allergen Reactions   Haldol [Haloperidol] Other (See Comments)    unspecified    SOCIAL & SUBSTANCE USE HISTORY  Social History   Socioeconomic History   Marital status: Single    Spouse name: Not on file   Number of children: Not on file   Years of education: Not on file   Highest education level: Not on file  Occupational History   Not on file  Tobacco Use   Smoking status: Every Day    Current packs/day: 1.00    Types: Cigarettes   Smokeless tobacco: Never  Vaping Use   Vaping status: Never Used  Substance and Sexual Activity   Alcohol use: No   Drug use: No   Sexual activity: Not on file  Other Topics Concern   Not on file  Social History Narrative   Not on file   Social Determinants of Health    Financial Resource Strain: Not on file  Food Insecurity: No Food Insecurity (05/06/2022)   Hunger Vital Sign    Worried About Running Out of Food in the Last Year: Never true    Ran Out of Food in the Last Year: Never true  Transportation Needs: No Transportation Needs (05/06/2022)   PRAPARE - Administrator, Civil Service (Medical): No    Lack of Transportation (Non-Medical): No  Physical Activity: Not on file  Stress: Not on file  Social Connections: Not on file   Social History   Tobacco Use  Smoking Status Every Day   Current packs/day: 1.00   Types: Cigarettes  Smokeless Tobacco Never   Social History   Substance and Sexual Activity  Alcohol Use No   Social History   Substance and Sexual Activity  Drug Use No    Additional pertinent information .  FAMILY HISTORY  No family history on file. Family Psychiatric History (if known):  mother, sister, daughter - Bipolar Disorder  MENTAL STATUS EXAM (MSE)  Presentation  General Appearance: Appropriate for Environment Eye Contact:Good Speech:Garbled; Pressured Speech Volume:Normal Handedness:No data recorded  Mood and Affect  Mood:Euthymic  Affect:Appropriate  Thought Process  Thought Processes:Disorganized Descriptions of Associations:Loose  Orientation:Full (Time, Place and Person)  Thought Content:Delusions; Illogical  History of Schizophrenia/Schizoaffective disorder:Yes  Duration of Psychotic Symptoms:Less than six months  Hallucinations:Hallucinations: None  Ideas of Reference:Delusions  Suicidal Thoughts:Suicidal Thoughts: No  Homicidal Thoughts:Homicidal Thoughts: No   Sensorium  Memory:Immediate Good; Recent Fair; Remote Fair Judgment:Fair Insight:Poor  Executive Functions  Concentration:Fair Attention Span:Fair Recall:Fair Fund of Knowledge:Fair Language:Fair  Psychomotor Activity  Psychomotor Activity:Psychomotor Activity: Increased  Assets  Assets:Communication  Skills; Housing; Social Support  Sleep  Sleep:Sleep: Fair   VITALS  Blood pressure (!) 159/87, pulse 74, temperature 98.5 F (36.9 C), resp. rate 16, height 5\' 10"  (1.778 m), weight 70 kg, SpO2 96%.  LABS  Admission on 08/06/2022  Component Date Value Ref Range Status   WBC 08/06/2022 6.1  4.0 - 10.5 K/uL Final   RBC 08/06/2022 3.85 (L)  4.22 - 5.81 MIL/uL Final   Hemoglobin 08/06/2022 12.5 (L)  13.0 - 17.0 g/dL Final   HCT 21/30/8657 39.9  39.0 - 52.0 % Final   MCV 08/06/2022 103.6 (H)  80.0 - 100.0 fL Final   MCH 08/06/2022 32.5  26.0 - 34.0 pg Final   MCHC 08/06/2022 31.3  30.0 - 36.0 g/dL Final   RDW 84/69/6295 12.2  11.5 - 15.5 % Final   Platelets 08/06/2022 197  150 - 400 K/uL Final   nRBC 08/06/2022 0.0  0.0 - 0.2 % Final   Neutrophils Relative % 08/06/2022 45  % Final   Neutro Abs 08/06/2022 2.7  1.7 - 7.7 K/uL Final   Lymphocytes Relative 08/06/2022 39  % Final   Lymphs Abs 08/06/2022 2.4  0.7 - 4.0 K/uL Final   Monocytes Relative 08/06/2022 13  % Final   Monocytes Absolute 08/06/2022 0.8  0.1 - 1.0 K/uL Final   Eosinophils Relative 08/06/2022 3  % Final   Eosinophils Absolute 08/06/2022 0.2  0.0 - 0.5 K/uL Final   Basophils Relative 08/06/2022 0  % Final   Basophils Absolute 08/06/2022 0.0  0.0 - 0.1 K/uL Final   Immature Granulocytes 08/06/2022 0  % Final   Abs Immature Granulocytes 08/06/2022 0.02  0.00 - 0.07 K/uL Final   Performed at [facility], [address] [place], [place] [address]   Sodium 08/06/2022 139  135 - 145 mmol/L Final   Potassium 08/06/2022 2.9 (L)  3.5 - 5.1 mmol/L Final   Chloride 08/06/2022 109  98 - 111 mmol/L Final   CO2 08/06/2022 20 (L)  22 - 32 mmol/L Final   Glucose, Bld 08/06/2022 107 (H)  70 - 99 mg/dL Final   Glucose reference range applies only to samples taken after fasting for at least 8 hours.   BUN 08/06/2022 11  6 - 20 mg/dL Final   Creatinine, Ser 08/06/2022 0.88  0.61 - 1.24 mg/dL Final   Calcium 24/40/1027 8.6 (L)  8.9 -  10.3 mg/dL Final   Total Protein 25/36/6440 6.9  6.5 - 8.1 g/dL Final   Albumin 34/74/2595 3.9  3.5 - 5.0 g/dL Final   AST 63/87/5643 17  15 - 41 U/L Final   ALT 08/06/2022 16  0 - 44 U/L Final   Alkaline Phosphatase 08/06/2022 67  38 - 126 U/L Final   Total Bilirubin 08/06/2022 0.8  0.3 - 1.2 mg/dL Final   GFR, Estimated 08/06/2022 >60  >60 mL/min Final   Comment: (NOTE) Calculated using the CKD-EPI Creatinine Equation (2021)    Anion gap 08/06/2022 10  5 - 15 Final   Performed at [facility], [address], [place], [place] [address]   Alcohol, Ethyl (B) 08/06/2022 <10  <10 mg/dL Final   Comment: (NOTE) Lowest detectable limit for serum alcohol is 10 mg/dL.  For medical purposes only. Performed at [facility], [address], [place], [place] [address]    Opiates 08/06/2022 NONE DETECTED  NONE DETECTED Final   Cocaine 08/06/2022 NONE DETECTED  NONE DETECTED Final   Benzodiazepines 08/06/2022 POSITIVE (A)  NONE DETECTED Final   Amphetamines 08/06/2022 NONE DETECTED  NONE DETECTED Final   Tetrahydrocannabinol 08/06/2022 NONE DETECTED  NONE DETECTED Final   Barbiturates 08/06/2022 NONE DETECTED  NONE DETECTED Final   Comment: (NOTE) DRUG SCREEN FOR MEDICAL PURPOSES ONLY.  IF CONFIRMATION IS NEEDED FOR ANY PURPOSE, NOTIFY LAB WITHIN 5 DAYS.  LOWEST DETECTABLE LIMITS FOR URINE DRUG SCREEN Drug Class                     Cutoff (ng/mL) Amphetamine and metabolites    1000 Barbiturate and metabolites    200 Benzodiazepine                 200 Opiates and metabolites        300 Cocaine and metabolites        300 THC                            50 Performed at [facility], [address] [place], [place] [address]    Specimen Source 08/06/2022 URINE, CLEAN CATCH   Final   Color, Urine 08/06/2022 STRAW (A)  YELLOW Final   APPearance 08/06/2022 CLEAR  CLEAR Final   Specific Gravity, Urine 08/06/2022 1.005  1.005 - 1.030 Final   pH 08/06/2022 6.0  5.0 - 8.0 Final   Glucose, UA  08/06/2022 NEGATIVE  NEGATIVE mg/dL Final   Hgb urine dipstick 08/06/2022 NEGATIVE  NEGATIVE Final   Bilirubin Urine 08/06/2022 NEGATIVE  NEGATIVE Final   Ketones, ur 08/06/2022 NEGATIVE  NEGATIVE mg/dL Final   Protein, ur 23/55/7322 NEGATIVE  NEGATIVE mg/dL Final   Nitrite 02/54/2706 NEGATIVE  NEGATIVE Final   Leukocytes,Ua 08/06/2022 NEGATIVE  NEGATIVE Final   RBC / HPF 08/06/2022 0-5  0 - 5 RBC/hpf Final   WBC, UA 08/06/2022 0-5  0 - 5 WBC/hpf Final   Comment:        Reflex urine culture not performed if WBC <=10, OR if Squamous epithelial cells >5. If Squamous epithelial cells >5 suggest recollection.    Bacteria, UA 08/06/2022 NONE SEEN  NONE SEEN Final   Squamous Epithelial / HPF 08/06/2022 0-5  0 - 5 /HPF Final   Mucus 08/06/2022 PRESENT   Final   Performed at [facility], [address] [place], [place] [address]    PSYCHIATRIC REVIEW OF SYSTEMS (ROS)  ROS: Notable for the following relevant positive findings: Review of Systems  Constitutional: Negative.   HENT: Negative.    Eyes: Negative.   Respiratory: Negative.    Cardiovascular: Negative.   Gastrointestinal: Negative.   Genitourinary: Negative.   Musculoskeletal: Negative.   Skin: Negative.   Neurological: Negative.   Endo/Heme/Allergies: Negative.   Psychiatric/Behavioral:  Negative for hallucinations (Delusional thinking).     Additional findings:      Musculoskeletal: No abnormal movements observed      Gait & Station: Normal      Pain Screening: Denies  Nutrition & Dental Concerns: If yes - consider referral to nutritional or dental specialist  RISK FORMULATION/ASSESSMENT  Is the patient experiencing any suicidal or homicidal ideations: No       Explain if yes:  Protective factors considered for safety management: Supportive living environment  Risk factors/concerns considered for safety management:  Age over 53 Aggression Male gender Unmarried  Is there a safety management plan with the  patient and treatment team to minimize risk factors and promote protective factors: Yes           Explain: Inpatient psychiatric admission, continue home medications Is crisis care placement or psychiatric hospitalization recommended: Yes     Based on my current evaluation and risk assessment, patient is determined at this time to be at:  Moderate Risk  *RISK ASSESSMENT Risk assessment is a dynamic process; it is possible that this patient's condition, and risk level, may change. This should be re-evaluated and managed over time as appropriate. Please re-consult psychiatric consult services if additional assistance is needed in terms of risk assessment and management. If your team decides to discharge this patient, please advise the patient how to best access emergency psychiatric services, or to call 911, if their condition worsens or they feel unsafe in any way.   Harlene Salts, NP Telepsychiatry Consult Services

## 2022-08-06 NOTE — ED Notes (Addendum)
Pt alert and hungry. Pt given lunch bag and gingerale. Pt given warm blanket

## 2022-08-06 NOTE — ED Triage Notes (Signed)
Pt bib RCSO under IVC from [facility]. Per paperwork pt has schizoaffective disorder and is noncompliant with medications, refusing to take them. Per facility pt has not slept in 4 days. Pt has been aggressive towards staff.   Pt states he is a white male and that his name is [name]

## 2022-08-06 NOTE — ED Notes (Signed)
Pt took shower, clean scrubs provided. Pt brushing teeth.

## 2022-08-06 NOTE — ED Notes (Signed)
Pt is asking to be provided a cell phone and laptop for personal use. Pt informed that those objects cannot be provided. Pt cooperative at this time. Pt is sitting on the side of the bed continuously talking to themself

## 2022-08-06 NOTE — ED Notes (Signed)
Forensic restraint removed and RCSO no longer at bedside

## 2022-08-07 DIAGNOSIS — F250 Schizoaffective disorder, bipolar type: Secondary | ICD-10-CM | POA: Diagnosis not present

## 2022-08-07 LAB — POTASSIUM: Potassium: 3.7 mmol/L (ref 3.5–5.1)

## 2022-08-07 MED ORDER — ZIPRASIDONE MESYLATE 20 MG IM SOLR
20.0000 mg | Freq: Once | INTRAMUSCULAR | Status: AC
  Administered 2022-08-07: 20 mg via INTRAMUSCULAR
  Filled 2022-08-07: qty 20

## 2022-08-07 MED ORDER — KETAMINE HCL 50 MG/ML IJ SOLN
4.0000 mg/kg | Freq: Once | INTRAMUSCULAR | Status: AC
  Administered 2022-08-07: 280 mg via INTRAMUSCULAR
  Filled 2022-08-07: qty 10

## 2022-08-07 MED ORDER — STERILE WATER FOR INJECTION IJ SOLN
INTRAMUSCULAR | Status: AC
  Filled 2022-08-07: qty 10

## 2022-08-07 NOTE — ED Notes (Signed)
Pt is now resting, will assess vitals when pt awakens

## 2022-08-07 NOTE — ED Notes (Signed)
Pt yelling into hallway Pt no longer redirectable  Informed MD Geodon given per [facility]

## 2022-08-07 NOTE — ED Notes (Signed)
Pt awake, pacing in room and talking with staff

## 2022-08-07 NOTE — Progress Notes (Signed)
LCSW Progress Note  [id number]   [name]  08/07/2022  1:57 AM    Inpatient Behavioral Health Placement  Pt meets inpatient criteria per Harlene Salts, NP-Telepsychiatry Consult Services. There are no available beds within [facility] system per [facility] Kim Brooks,RN. Referral was sent to the following facilities;   Destination  Service Provider Address Phone [facility] [place]  [address] [place], [place] [address] [phone]  [facility]  [address] [place] [address] [phone]  [facility]  [address] [place], [place] [address] [phone]  [facility]  [address] [place], [place] [address] [phone]  [facility]  [address] [place] [address] [phone]  [facility]  [address] [place] [address] [phone]  [facility]  [address] [place] [address] [phone]  [facility]  [address] [place] [address] [phone]  [facility]  [address], [place] [address] [phone]  [facility]  [address] [place] [address] [phone]  [facility]  [address], [place] [address] [phone]  [facility]  [address] [place] [address] [phone]  [facility]  [address] [place] [address] [phone]  [facility]  [address] [place] [address] [phone]  [facility]  [address] [place] [address] [phone]  [facility]  [address], [place] [address] [phone]  [facility]  [address], [place] [address] [phone]  [facility]  [address] [place] [address] [phone]  [facility]  [address] [place] [address] [phone]  [facility]  [address], [place] [address] [phone]  [facility] Health  1 medical [place] [address] [phone]  [facility]  [address] [place] [address] [phone]  [facility] [address]  [address] [place] [address] [phone]  [facility]  [address] [place] [address] [phone]  [facility]  [address] [place] [address] [phone]  [facility]  [id number] [place] [address] [phone]  [facility]  [address], [place] [address] [phone]  [facility]  [address] [place], [place] [address] [phone]    Situation ongoing,  CSW will follow up.    Maryjean Ka, MSW, LCSWA 08/07/2022 1:57 AM

## 2022-08-07 NOTE — ED Notes (Signed)
Pt up to the shower.

## 2022-08-07 NOTE — ED Notes (Signed)
Pt yelling into the hall Pt getting increasingly aggressive and agitated  Informed MD Medicated per MAR Pt attached to full monitor Sitter aware to inform this RN if O2 sats drop below 92%RA

## 2022-08-07 NOTE — ED Notes (Signed)
Pt calm and cooperative at this time Temp assessed Gave oral meds per [facility]

## 2022-08-07 NOTE — ED Notes (Signed)
Vitals assessed Pt denies all pain  Pt rambling stated that his mother is [facility]. Then pt stated mother is Lafonda Mosses Pt then wished this RN a Theatre manager outside room Will continue to monitor

## 2022-08-07 NOTE — ED Notes (Signed)
Medicated per MAR

## 2022-08-07 NOTE — ED Notes (Signed)
Pt sitting in room; alert and calm at this time

## 2022-08-07 NOTE — ED Notes (Signed)
Staff from [facility] called to get information about pt and will call back if pt is accepted

## 2022-08-07 NOTE — ED Notes (Signed)
Pt took medications without difficulty and refused his nicotine patch; pt now talking with staff

## 2022-08-07 NOTE — Progress Notes (Signed)
LCSW Progress Note  [id number]   DREX PIERSOL  08/07/2022  9:49 AM  Description:   Inpatient Psychiatric Referral  Patient was recommended inpatient per Paschal Dopp, NP. There are [facility] beds [facility], per [facility] Rona Ravens, RN. Patient was referred to the following out of network facilities:   Destination  Service Provider Address Phone Fax  [place] [facility] [place]  [address] [place], [place] [address] [phone]  [facility]  [address] [place] [address] [phone]  [facility]  [address] [place], [place] [address] [phone]  [facility]  [address] [place], [place] [address] [phone]  [facility]  [address] [place] [address] [phone]  [facility]  [address] [place] [address] [phone]  [facility]  [address] [place] [address] [phone]  [facility]  [address] [place] [place] [address] [phone]  [facility]  [address], [place] [address] [phone]  [facility]  [address] [place] [address] [phone]  [facility]  [address], [place] [address] [phone]  [facility]  [address], [place] [address] [phone]  [facility]  [address] [place] [address] [phone]  [facility]  [address], [place] [address] [phone]  [facility]  [address] [place] [address] [phone]  [facility]   [address], [place] [address] [phone]  [facility]  [address], [place] [address] [phone]  [facility]  [address], [place] [address] [phone]  [facility]  [address] [place] [address] [phone]  [facility]  [address] [place] [address] [phone]  [facility]  1 medical [place] [address] [phone]  [facility]  [address] [place] [address] [phone]  [facility] [address]  [address], [place] [address] [phone]  [facility]  [address] [place] [address] [phone]  [facility]  [address] [place] [address] [phone]  [facility]  [phone] [address] [place] [address] [place] [address] [phone]  [facility] [address] [place]  [address] [place] [address] [phone]  [facility]  [address] [place], [place] [address] [phone]    Situation ongoing, CSW to continue following and update chart as more information becomes available.      Cathie Beams, [place]  08/07/2022 9:49 AM

## 2022-08-07 NOTE — ED Notes (Signed)
Pt is awake and yelling at sitter Removed pt from monitor Lunch tray given

## 2022-08-08 DIAGNOSIS — F250 Schizoaffective disorder, bipolar type: Secondary | ICD-10-CM | POA: Diagnosis not present

## 2022-08-08 NOTE — ED Notes (Signed)
Received report from [employer]. Pt lying in bed. Called me to room and had fight of ideas. Cooperative and slightly hyperactivity. Drink given. Sitter at bedside.

## 2022-08-08 NOTE — ED Notes (Signed)
Pt requesting to shower. Sitter will assist.

## 2022-08-08 NOTE — ED Provider Notes (Signed)
Emergency Medicine Observation Re-evaluation Note  [name] is a 62 y.o. male, seen on rounds today.  Pt initially presented to the ED for complaints of No chief complaint on file. Currently, the patient is resting in bed.  Physical Exam  BP (!) 148/79 (BP Location: Right Arm)   Pulse 72   Temp 97.7 F (36.5 C)   Resp 20   Ht 5\' 10"  (1.778 m)   Wt 70 kg   SpO2 96%   BMI 22.14 kg/m  Physical Exam General: No acute distress Cardiac: Well-perfused Lungs: Nonlabored Psych: Cooperative  ED Course / MDM  EKG:EKG Interpretation Date/Time:  Wednesday August 06 2022 03:25:25 EDT Ventricular Rate:  82 PR Interval:  194 QRS Duration:  89 QT Interval:  404 QTC Calculation: 472 R Axis:   -8  Text Interpretation: Sinus rhythm Normal ECG Confirmed by Gilda Crease [phone]) on 08/06/2022 3:30:56 AM  I have reviewed the labs performed to date as well as medications administered while in observation.  Recent changes in the last 24 hours include no significant changes.  Plan  Current plan is for inpatient psychiatric admission.  Patient is on an IVC.Marland Kitchen    Terrilee Files, MD 08/08/22 [phone]

## 2022-08-08 NOTE — ED Notes (Signed)
Pt returned to room following shower. Remains calm and cooperative.

## 2022-08-08 NOTE — Progress Notes (Signed)
LCSW Progress Note  [id number]   [name]  08/08/2022  11:34 PM    Inpatient Behavioral Health Placement  There are no available beds within [facility] system per [facility] Kim Brooks,RN. Referral was sent to the following facilities;   Destination  Service Provider Address Phone [facility]  [address] [place], [place] [address] [phone]  [facility]  [address] [place] [address] [phone]  [facility]  [address] [place], [place] [address] [phone]  [facility]  [address] [place], [place] [address] [phone]  [facility]  [address] [place] [address] [phone]  [facility]  [address] [place] [address] [phone]  [facility]  [address], [place] [address] [phone]  [facility]  [address] [place] [address] [phone]  [facility]  [address], [place] [address] [phone]  [facility]  [address], [place] [address] [phone]  [facility]  [address] [place] [address] [phone]  [facility]  [address] [place] [address] [phone]  [facility]  [address] [place] [address] [phone]  [facility]  [address], [place] [address] [phone]  [facility]  [address] [place] [address] [phone]  [facility]  [address] [place] [address] [phone]   [facility]  [address] [place], [place] [address] [phone]  [facility]  [address] [place] [address] [phone]  [facility]  [address] [place] [address] [phone]  [facility]  [address], [place] [address] [phone]  [facility] Health  1 medical [place] [address] [phone]  [facility]  [address], [place] [address] [phone]  [facility] [address]  [address], [place] [address] [phone]  [facility]  [address] [place] [address] [phone]  [facility]  [address] [place] [address] [phone]  [facility]  [phone] [address] [place] [address] [phone]  [facility]  [address] [place] [address] [phone]  [facility]  [address] [place], [place] [address] [phone]  [facility]  [address] [place], [place] [address] [phone]  [facility]  [address], [place] [address] [phone]  [facility]  [address], [place] [address] [phone]    Situation ongoing,  CSW will follow up.    Maryjean Ka, MSW, [facility] 08/08/2022 11:34 PM

## 2022-08-08 NOTE — Progress Notes (Signed)
Pt was faxed to out of network providers once again:  [facility] Provider Address Phone Fax  [facility]  [address] [place], [place] [address] [phone]  [facility]  [address] [place] [address] [phone]  [facility]Ger[facility]  [address] [place], [place] [address] [phone]  [facility]-Adult  [address] [place], [place] [address] [phone]  [facility]  [address] [place] [address] [phone]  [facility]  [address] [place] [address] [phone]  [facility]  [address], [place] [address] [phone]  [facility]  [address] [place] [place] [address] [phone]  [facility]  [address] [place] [address] [phone]  [facility]  [address] [place] [address] [phone]  [facility]  [address], [place] [address] [phone]  [facility]  [address], [place] [address] [phone]  [facility]  [address] [place] [address] [phone]  [facility]  [address], [place] [address] [phone]  [facility]  [address] [place] [address] [phone]  [facility]  [address], [place] [address] [phone]  [facility]  [address] [place], [place] [address] [phone]  [facility]  [address], [place] [address] [phone]   [facility]  [address], [place] [address] [phone]  [facility]  [address] [place] [address] [phone]  [facility]  1 medical [place] [address] [phone]  [facility]  [address] [place] [address] [phone]  [facility] [address]  [address] [place] [address] [phone]  [facility]  [address] [place] [address] [phone]  [facility]  [address] [place] [address] [phone]  [facility]  [phone] [address] [place] [address] [phone]  [facility]  [address], [place] [address] [phone]  [facility]  [address] [place], [place] [address] [phone]  [facility]  [address] [place], [place] [address] [phone]  [facility]  [address] [place] [address] [phone]  [facility]  [address] [place] [address] [phone]    Maryjean Ka, MSW, LCSWA 08/08/2022 2:23 AM

## 2022-08-08 NOTE — ED Notes (Signed)
Pt provided a turkey sandwich and ginger ale.

## 2022-08-08 NOTE — ED Notes (Signed)
Pt standing at the doorway of his room, talking with himself.

## 2022-08-09 DIAGNOSIS — F250 Schizoaffective disorder, bipolar type: Secondary | ICD-10-CM | POA: Diagnosis not present

## 2022-08-09 MED ORDER — LORAZEPAM 2 MG/ML IJ SOLN
2.0000 mg | Freq: Once | INTRAMUSCULAR | Status: AC
  Administered 2022-08-09: 2 mg via INTRAMUSCULAR
  Filled 2022-08-09: qty 1

## 2022-08-09 MED ORDER — DIPHENHYDRAMINE HCL 25 MG PO CAPS
25.0000 mg | ORAL_CAPSULE | Freq: Four times a day (QID) | ORAL | Status: DC | PRN
  Administered 2022-08-09 (×2): 25 mg via ORAL
  Filled 2022-08-09 (×3): qty 1

## 2022-08-09 MED ORDER — LORAZEPAM 1 MG PO TABS
3.0000 mg | ORAL_TABLET | Freq: Once | ORAL | Status: AC
  Administered 2022-08-09: 3 mg via ORAL
  Filled 2022-08-09: qty 3

## 2022-08-09 NOTE — ED Notes (Signed)
Patient is increasingly agitated and more difficult to redirect. MD notified. Meds requested to assist in therapeutic rest

## 2022-08-09 NOTE — ED Notes (Signed)
Stating his name is Cammy Copa and rambling/mumbling

## 2022-08-09 NOTE — ED Notes (Signed)
Morning vitals delayed due to therapeutic rest.

## 2022-08-09 NOTE — Progress Notes (Signed)
Patient has been denied by [facility] due to no appropriate beds available. Patient meets BH inpatient criteria per Paschal Dopp, NP. Patient has been faxed out to the following facilities:   [facility]  [address] [place], [place] [address] [phone]  [facility]  [address] [place] [address] [phone]  [facility]-Geriatric  [address] [place], [place] [address] [phone]  [facility]-Adult  [address] [place], [place] [address] [phone]  [facility]  [address] [place] [place] [address] [phone]  [facility]  [address] [place] [address] [phone]  [facility]  [address], [place] [address] [phone]  [facility]  [address] [place] [place] [address] [phone]  [facility]  [address], [place] [address] [phone]  [facility]  [address] [place] [address] [phone]  [facility]  [address] [place] [address] [phone]  [facility]  [address], [place] [address] [phone]  [facility]  [address] [place] [address] [phone]  [facility]  [address], [place] [address] [phone]  [facility]  [address], [place] [address] [phone]  [facility]  [address], [place] [address] [phone]  [facility]  [address] [place], [place] [address] [phone]  [facility]  [address], [place] [address] [phone]  [facility]  [address] [place] [address] [phone]  [facility]  [address], [place] [address] [phone]  [facility] Health  1 medical [place] [address] [phone]  [facility]  [address] [place] [address] [phone]  [facility] [address]  [address], [place] [address] [phone]  [facility]  [address] [place] [address] [phone]  [facility]  [address] [place] [address] [phone]  [facility]  [address] [place] [place] [address] [phone]  [facility]  [address], [place] [address] [phone]  [facility]  [address] [place], [place] [address] [phone]  [facility]  [address] [place], [place] [address] [phone]  [facility]  [address], [place] [address] [phone]  [facility]  [address] [place] [address] [phone]   Damita Dunnings, MSW, LCSW-A  11:53 AM 08/09/2022

## 2022-08-09 NOTE — ED Notes (Signed)
Pt pacing in room, yelling out at internal stimuli. EDP aware and orders placed. Pt took meds without difficulty.

## 2022-08-09 NOTE — ED Notes (Signed)
1000 meds given early to prevent agitation per MD approval.

## 2022-08-09 NOTE — ED Notes (Signed)
Patient increasingly agitated, screaming at people that are not in the room with him. Told Nurse and Sitters around that we need to pray to burn in hell. Screaming bothering pt next door and getting her upset

## 2022-08-09 NOTE — ED Notes (Signed)
Pt ambulated to BR, steady gait noted 

## 2022-08-09 NOTE — ED Notes (Signed)
@  1046 patient ambulated to BR
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
## 2022-08-09 NOTE — ED Provider Notes (Signed)
Patient somewhat agitated today.  Does have his medications and we did give them to him early.  Nurse was concerned about may be tardive dyskinesia.  So we did give some Benadryl but am not so sure that that is exactly what is going on.  Patient is alert nontoxic no acute distress.  Patient is waiting's psychiatric placement.  Patient is IVC.   Vanetta Mulders, MD 08/09/22 [phone]

## 2022-08-10 ENCOUNTER — Emergency Department (HOSPITAL_COMMUNITY): Payer: Medicare Other

## 2022-08-10 ENCOUNTER — Inpatient Hospital Stay
Admission: AD | Admit: 2022-08-10 | Discharge: 2022-10-02 | DRG: 885 | Disposition: A | Discharge status: Home / Self Care | Payer: Medicare Other | Source: Intra-hospital | Attending: Psychiatry | Admitting: Psychiatry

## 2022-08-10 ENCOUNTER — Encounter: Payer: Self-pay | Admitting: Psychiatry

## 2022-08-10 ENCOUNTER — Other Ambulatory Visit: Payer: Self-pay

## 2022-08-10 DIAGNOSIS — F250 Schizoaffective disorder, bipolar type: Principal | ICD-10-CM | POA: Diagnosis present

## 2022-08-10 DIAGNOSIS — I10 Essential (primary) hypertension: Secondary | ICD-10-CM | POA: Diagnosis present

## 2022-08-10 DIAGNOSIS — Z79899 Other long term (current) drug therapy: Secondary | ICD-10-CM

## 2022-08-10 DIAGNOSIS — F17210 Nicotine dependence, cigarettes, uncomplicated: Secondary | ICD-10-CM | POA: Diagnosis present

## 2022-08-10 DIAGNOSIS — Z7951 Long term (current) use of inhaled steroids: Secondary | ICD-10-CM

## 2022-08-10 DIAGNOSIS — Z87820 Personal history of traumatic brain injury: Secondary | ICD-10-CM

## 2022-08-10 MED ORDER — AMLODIPINE BESYLATE 5 MG PO TABS
5.0000 mg | ORAL_TABLET | Freq: Every day | ORAL | Status: DC
  Filled 2022-08-10: qty 1

## 2022-08-10 MED ORDER — FLUTICASONE FUROATE-VILANTEROL 200-25 MCG/ACT IN AEPB
1.0000 | INHALATION_SPRAY | Freq: Every day | RESPIRATORY_TRACT | Status: DC
  Administered 2022-08-20 – 2022-10-02 (×27): 1 via RESPIRATORY_TRACT
  Filled 2022-08-10 (×5): qty 28

## 2022-08-10 MED ORDER — TRAZODONE HCL 100 MG PO TABS
100.0000 mg | ORAL_TABLET | Freq: Every evening | ORAL | Status: DC | PRN
  Administered 2022-08-10 – 2022-08-29 (×14): 100 mg via ORAL
  Filled 2022-08-10 (×19): qty 1

## 2022-08-10 MED ORDER — NICOTINE 21 MG/24HR TD PT24
21.0000 mg | MEDICATED_PATCH | Freq: Every day | TRANSDERMAL | Status: DC
  Filled 2022-08-10 (×9): qty 1

## 2022-08-10 MED ORDER — AMANTADINE HCL 100 MG PO CAPS
100.0000 mg | ORAL_CAPSULE | Freq: Two times a day (BID) | ORAL | Status: DC
  Administered 2022-08-10 – 2022-09-02 (×30): 100 mg via ORAL
  Filled 2022-08-10 (×48): qty 1

## 2022-08-10 MED ORDER — DIPHENHYDRAMINE HCL 25 MG PO CAPS
25.0000 mg | ORAL_CAPSULE | Freq: Four times a day (QID) | ORAL | Status: DC | PRN
  Administered 2022-08-10 – 2022-08-30 (×16): 25 mg via ORAL
  Filled 2022-08-10 (×16): qty 1

## 2022-08-10 MED ORDER — LORAZEPAM 1 MG PO TABS
1.0000 mg | ORAL_TABLET | ORAL | Status: AC | PRN
  Administered 2022-08-10: 1 mg via ORAL
  Filled 2022-08-10: qty 1

## 2022-08-10 MED ORDER — LOSARTAN POTASSIUM 25 MG PO TABS
100.0000 mg | ORAL_TABLET | Freq: Every day | ORAL | Status: DC
  Administered 2022-08-14 – 2022-10-02 (×36): 100 mg via ORAL
  Filled 2022-08-10 (×45): qty 4

## 2022-08-10 MED ORDER — RISPERIDONE 1 MG PO TABS
3.0000 mg | ORAL_TABLET | ORAL | Status: DC
  Administered 2022-08-10 – 2022-08-19 (×19): 3 mg via ORAL
  Filled 2022-08-10 (×19): qty 3

## 2022-08-10 MED ORDER — CARBAMAZEPINE ER 200 MG PO TB12
400.0000 mg | ORAL_TABLET | Freq: Every day | ORAL | Status: DC
  Administered 2022-08-10 – 2022-08-16 (×4): 400 mg via ORAL
  Filled 2022-08-10 (×8): qty 2

## 2022-08-10 MED ORDER — ZIPRASIDONE MESYLATE 20 MG IM SOLR
20.0000 mg | INTRAMUSCULAR | Status: DC | PRN
  Filled 2022-08-10: qty 20

## 2022-08-10 MED ORDER — RISPERIDONE 1 MG PO TBDP
2.0000 mg | ORAL_TABLET | Freq: Three times a day (TID) | ORAL | Status: DC | PRN
  Administered 2022-08-11 – 2022-08-12 (×2): 2 mg via ORAL
  Filled 2022-08-10 (×4): qty 2

## 2022-08-10 MED ORDER — ATORVASTATIN CALCIUM 10 MG PO TABS
40.0000 mg | ORAL_TABLET | Freq: Every day | ORAL | Status: DC
  Administered 2022-08-11 – 2022-10-01 (×44): 40 mg via ORAL
  Filled 2022-08-10 (×52): qty 4

## 2022-08-10 NOTE — ED Provider Notes (Addendum)
Emergency Medicine Observation Re-evaluation Note  [name] is a 62 y.o. male, seen on rounds today.  Pt initially presented to the ED for complaints of No chief complaint on file. Currently, the patient is watching TV.  Physical Exam  BP (!) 166/75 (BP Location: Left Arm)   Pulse 66   Temp 97.6 F (36.4 C)   Resp 17   Ht 5\' 10"  (1.778 m)   Wt 70 kg   SpO2 97%   BMI 22.14 kg/m  Physical Exam General: no acute distress Cardiac: regular rate Lungs: equal chest rise Psych: calm  ED Course / MDM  EKG:EKG Interpretation Date/Time:  Wednesday August 06 2022 03:25:25 EDT Ventricular Rate:  82 PR Interval:  194 QRS Duration:  89 QT Interval:  404 QTC Calculation: 472 R Axis:   -8  Text Interpretation: Sinus rhythm Normal ECG Confirmed by Gilda Crease [phone]) on 08/06/2022 3:30:56 AM  I have reviewed the labs performed to date as well as medications administered while in observation.  Recent changes in the last 24 hours include was agitated, received ativan, calm now.  Plan  Current plan is for geri psych placement.    Lonell Grandchild, MD 08/10/22 [phone]  Patient accepted to [facility] geri psych  pending CT head. CT head was performed and is negative. Will transfer to [facility], Jerilee Field, MD 08/10/22 [phone]

## 2022-08-10 NOTE — ED Notes (Signed)
Attempted to administer patient PRN trazodone due to lack of sleep throughout the night. Patient refused medication at this time stating he did not want to sleep all day due to taking it so late. States he will go to sleep after he finishes his OJ provided. This nurse agrees with patient and allows him the opportunity to go to sleep on his own.

## 2022-08-10 NOTE — Group Note (Signed)
Date:  08/10/2022 Time:  11:00 PM  Group Topic/Focus:  Building Self Esteem:   The Focus of this group is helping patients become aware of the effects of self-esteem on their lives, the things they and others do that enhance or undermine their self-esteem, seeing the relationship between their level of self-esteem and the choices they make and learning ways to enhance self-esteem. Coping With Mental Health Crisis:   The purpose of this group is to help patients identify strategies for coping with mental health crisis.  Group discusses possible causes of crisis and ways to manage them effectively. Developing a Wellness Toolbox:   The focus of this group is to help patients develop a "wellness toolbox" with skills and strategies to promote recovery upon discharge. Goals Group:   The focus of this group is to help patients establish daily goals to achieve during treatment and discuss how the patient can incorporate goal setting into their daily lives to aide in recovery. Healthy Communication:   The focus of this group is to discuss communication, barriers to communication, as well as healthy ways to communicate with others. Making Healthy Choices:   The focus of this group is to help patients identify negative/unhealthy choices they were using prior to admission and identify positive/healthier coping strategies to replace them upon discharge. Self Care:   The focus of this group is to help patients understand the importance of self-care in order to improve or restore emotional, physical, spiritual, interpersonal, and financial health. Self Esteem Action Plan:   The focus of this group is to help patients create a plan to continue to build self-esteem after discharge. Wellness Toolbox:   The focus of this group is to discuss various aspects of wellness, balancing those aspects and exploring ways to increase the ability to experience wellness.  Patients will create a wellness toolbox for use upon  discharge.    Participation Level:  Minimal  Participation Quality:  Sharing  Affect:  Appropriate and Blunted  Cognitive:  Disorganized, Confused, and Delusional  Insight: Lacking  Engagement in Group:  Lacking  Modes of Intervention:  Discussion  Additional Comments:    [name] 08/10/2022, 11:00 PM

## 2022-08-10 NOTE — Plan of Care (Signed)
  Problem: Education: Goal: Knowledge of General Education information will improve Description: Including pain rating scale, medication(s)/side effects and non-pharmacologic comfort measures Outcome: Not Progressing   Problem: Health Behavior/Discharge Planning: Goal: Ability to manage health-related needs will improve Outcome: Not Progressing   Problem: Clinical Measurements: Goal: Ability to maintain clinical measurements within normal limits will improve Outcome: Not Progressing   Problem: Coping: Goal: Level of anxiety will decrease Outcome: Not Progressing   
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
## 2022-08-10 NOTE — Consult Note (Signed)
Call attempt to [facility]-legal guardian [phone].  Call attempt to inform guardian that patient has been accepted to [facility] for inpatient psychiatric admission unsuccessful.  Patient remains under IVC and will be transferred via law enforcement.

## 2022-08-10 NOTE — ED Notes (Signed)
Called for transport to [facility].

## 2022-08-10 NOTE — Group Note (Signed)
Date:  08/10/2022 Time:  2:44 PM  Group Topic/Focus:  Healthy Personal Boundaries  The purpose of this group was to identity the differences between the different groups of personal boundaries such as rigid, porous and healthy boundaries. Also indentifying which boundary was theirs.    Participation Level:  Did Not Attend  Participation Quality:    Affect:    Cognitive:    Insight:   Engagement in Group:    Modes of Intervention:    Additional Comments:  Did not attend   [place] Wadas T Noe Gens 08/10/2022, 2:44 PM

## 2022-08-11 DIAGNOSIS — F250 Schizoaffective disorder, bipolar type: Secondary | ICD-10-CM

## 2022-08-11 MED ORDER — CLONIDINE HCL 0.1 MG PO TABS
0.1000 mg | ORAL_TABLET | Freq: Three times a day (TID) | ORAL | Status: DC | PRN
  Filled 2022-08-11 (×2): qty 1

## 2022-08-11 MED ORDER — OLANZAPINE 5 MG PO TABS
10.0000 mg | ORAL_TABLET | Freq: Four times a day (QID) | ORAL | Status: DC | PRN
  Administered 2022-08-13 – 2022-08-15 (×3): 10 mg via ORAL
  Filled 2022-08-11 (×5): qty 2

## 2022-08-11 MED ORDER — AMLODIPINE BESYLATE 5 MG PO TABS
10.0000 mg | ORAL_TABLET | Freq: Every day | ORAL | Status: DC
  Administered 2022-08-14 – 2022-10-02 (×32): 10 mg via ORAL
  Filled 2022-08-11 (×44): qty 2

## 2022-08-11 NOTE — [facility] Counselor (Addendum)
CSW contacted [name] per pt's request  [name] states he is not at baseline due to changes in his Lithium level. [name] states he has known pt for years, and this happens every so often when the pt's level are not right.   [name] states that pt has a doctor at [facility] that is very good with pt, and who should be contacted regarding pt's prescriptions.   [name] also states that pt's baseline is very quiet, able to cook, clean, pay bills, and function normally  [name] states he can be called if further information is needed.   [name] states that he would consider taking pt back into the group home  [place], MSW, [facility] 08/11/2022 2:12 PM

## 2022-08-11 NOTE — Group Note (Signed)
Date:  08/11/2022 Time:  11:08 PM  Group Topic/Focus:  Building Self Esteem:   The Focus of this group is helping patients become aware of the effects of self-esteem on their lives, the things they and others do that enhance or undermine their self-esteem, seeing the relationship between their level of self-esteem and the choices they make and learning ways to enhance self-esteem. Coping With Mental Health Crisis:   The purpose of this group is to help patients identify strategies for coping with mental health crisis.  Group discusses possible causes of crisis and ways to manage them effectively. Conflict Resolution:   The focus of this group is to discuss the conflict resolution process and how it may be used upon discharge. Crisis Planning:   The purpose of this group is to help patients create a crisis plan for use upon discharge or in the future, as needed. Developing a Wellness Toolbox:   The focus of this group is to help patients develop a "wellness toolbox" with skills and strategies to promote recovery upon discharge. Early Warning Signs:   The focus of this group is to help patients identify signs or symptoms they exhibit before slipping into an unhealthy state or crisis. Emotional Education:   The focus of this group is to discuss what feelings/emotions are, and how they are experienced. Goals Group:   The focus of this group is to help patients establish daily goals to achieve during treatment and discuss how the patient can incorporate goal setting into their daily lives to aide in recovery. Healthy Communication:   The focus of this group is to discuss communication, barriers to communication, as well as healthy ways to communicate with others. Identifying Needs:   The focus of this group is to help patients identify their personal needs that have been historically problematic and identify healthy behaviors to address their needs. Making Healthy Choices:   The focus of this group is to help  patients identify negative/unhealthy choices they were using prior to admission and identify positive/healthier coping strategies to replace them upon discharge. Overcoming Stress:   The focus of this group is to define stress and help patients assess their triggers. Personal Choices and Values:   The focus of this group is to help patients assess and explore the importance of values in their lives, how their values affect their decisions, how they express their values and what opposes their expression. Self Care:   The focus of this group is to help patients understand the importance of self-care in order to improve or restore emotional, physical, spiritual, interpersonal, and financial health.    Participation Level:  None  Participation Quality:  Inattentive  Affect:  Angry, Anxious, Blunted, Excited, and Tearful  Cognitive:  Confused and Delusional  Insight: Lacking  Engagement in Group:  None  Modes of Intervention:  Discussion  Additional Comments:    [name] 08/11/2022, 11:08 PM

## 2022-08-11 NOTE — Progress Notes (Signed)
   08/11/22 1200  Psych Admission Type (Psych Patients Only)  Admission Status Involuntary  Psychosocial Assessment  Patient Complaints Anxiety  Thought Process  Coherency Disorganized  Content Paranoia  Delusions UTA  Perception Derealization;Hallucinations  Hallucination Auditory;Visual  Judgment Poor  Confusion Moderate  Danger to Self  Current suicidal ideation? Denies  Agreement Not to Harm Self Yes   Patient had a meltdown today but not aggressive. Offered medication for anxiety but refused and stated to be left alone. Few minutes later patient appeared calm and interacting with patient and staff. Multiple showers observed taking this shift. Tolerated all meals. Refused medications except Risperidone.

## 2022-08-11 NOTE — Group Note (Signed)
Date:  08/11/2022 Time:  3:27 PM  Group Topic/Focus:  Coping With Mental Health Crisis:   The purpose of this group is to help patients identify strategies for coping with mental health crisis.  Group discusses possible causes of crisis and ways to manage them effectively. Healthy Communication:   The focus of this group is to discuss communication, barriers to communication, as well as healthy ways to communicate with others.  We were able to go outside to get some fresh air and we listened to music as well!    Participation Level:  Active  Participation Quality:  Appropriate  Affect:  Appropriate  Cognitive:  Appropriate  Insight: Appropriate  Engagement in Group:  Engaged  Modes of Intervention:  Discussion  Additional Comments:    [name] 08/11/2022, 3:27 PM

## 2022-08-11 NOTE — BHH Suicide Risk Assessment (Signed)
[facility] Admission Suicide Risk Assessment   Nursing information obtained from:  Patient Demographic factors:  Male, Unemployed Current Mental Status:  NA Loss Factors:  Financial problems / change in socioeconomic status Historical Factors:  NA Risk Reduction Factors:  Positive therapeutic relationship  Total Time spent with patient: 1 hour Principal Problem: Schizoaffective disorder, bipolar type (HCC) Diagnosis:  Principal Problem:   Schizoaffective disorder, bipolar type (HCC)  Subjective Data: [name] is a 62 year old African-American male who is admitted voluntarily after much encouragement to geriatric psychiatry. He was last admitted to [facility] about 1 year ago it with a similar presentation of agitation and not sleeping. His delusions and agitation have a history of resolving with mood stabilizers and antipsychotics. He presented to the ED from his group home[name], for changes in behaviors, history of schizoaffective d/o. He said one unintelligible word on assessment. He was banging on his walls earlier this morning and had to have agitation medications to calm. On assessment, he had the blanket over his head and did show one eye when requested to see his face. His group home owner contacted, [name], who reported he is "usually a quiet man of few words". Prior to admission, he was agitated with increase in energy, delusional. His MD wanted him to come to the ED to be evaluated for his recent UTI and Lithium level (0.81, WDL but on the lower end for his normal of 1.02-1.13). Increased his Lithium by 300 mg in the am. His Zyprexa combination pill, Lybalvi, is not available. Changed to 10 mg BID of Zyprexa. His U/A was clear. Medications will continue to be managed until he returns to baseline to return to his group home.   Continued Clinical Symptoms:  Alcohol Use Disorder Identification Test Final Score (AUDIT): 0 The "Alcohol Use Disorders Identification Test", Guidelines for Use in  Primary Care, Second Edition.  World Science writer [facility]). Score between 0-7:  no or low risk or alcohol related problems. Score between 8-15:  moderate risk of alcohol related problems. Score between 16-19:  high risk of alcohol related problems. Score 20 or above:  warrants further diagnostic evaluation for alcohol dependence and treatment.   CLINICAL FACTORS:   Schizophrenia:   Command hallucinatons   Musculoskeletal: Strength & Muscle Tone: within normal limits Gait & Station: normal Patient leans: N/A  Psychiatric Specialty Exam:  Presentation  General Appearance:  Casual  Eye Contact: Fair  Speech: Garbled; Normal Rate  Speech Volume: Normal  Handedness: Right   Mood and Affect  Mood: Euthymic  Affect: Congruent   Thought Process  Thought Processes: Disorganized  Descriptions of Associations:Loose  Orientation:Full (Time, Place and Person)  Thought Content:Scattered; Delusions; Illogical  History of Schizophrenia/Schizoaffective disorder:Yes  Duration of Psychotic Symptoms:Greater than six months  Hallucinations:No data recorded Ideas of Reference:None  Suicidal Thoughts:No data recorded Homicidal Thoughts:No data recorded  Sensorium  Memory: Recent Poor; Remote Poor; Immediate Poor  Judgment: Poor  Insight: Poor   Executive Functions  Concentration: Fair  Attention Span: Fair  Recall: Poor  Fund of Knowledge: Poor  Language: Fair   Psychomotor Activity  Psychomotor Activity:No data recorded  Assets  Assets: Physical Health; Resilience; Financial Resources/Insurance   Sleep  Sleep:No data recorded    Blood pressure (!) 151/94, pulse 78, temperature (!) 97.2 F (36.2 C), resp. rate 16, height 5\' 7"  (1.702 m), weight 65.5 kg, SpO2 99%. Body mass index is 22.63 kg/m.   COGNITIVE FEATURES THAT CONTRIBUTE TO RISK:  Closed-mindedness and Loss of  executive function    SUICIDE RISK:   Minimal: No  identifiable suicidal ideation.  Patients presenting with no risk factors but with morbid ruminations; may be classified as minimal risk based on the severity of the depressive symptoms  PLAN OF CARE: See orders  I certify that inpatient services furnished can reasonably be expected to improve the patient's condition.   Sarina Ill, DO 08/11/2022, 1:47 PM

## 2022-08-11 NOTE — Progress Notes (Signed)
I assumed care for [name] at about 19:30. He was pacing around in his room, restless, speech is rapid and he is hard to redirect. Denied any new pain,is actively responding to internal stimuli, denied any vh/hi/si,vital signs at his baseline, he attended groups but was disruptive, he was very resistive to taking his scheduled PO meds, he accepted Lorazepam after a lot of prompting. He later took PO benadryl and trazodone, all of which were ineffective as pt is still awake at this time and not following redirection.Will continue to monitor as ordered. No falls thus far.

## 2022-08-11 NOTE — Progress Notes (Signed)
Presents anxious and irritable. Noted in dayroom during group, restless at times. Patient refused medications except 2 pills. Yelling and arguing with staff. Denies SI, HI, AVH. Isolative to self, watching tv at times.  Encouragement and support provided. Safety checks maintained. Medications given as prescribed. Pt receptive and remains safe on unit with q 15 min checks.

## 2022-08-11 NOTE — Plan of Care (Signed)

## 2022-08-11 NOTE — BHH Counselor (Signed)
CSW contacted [facility] [employer], who state pt is still a client with them. They state his new guardian is [name] [phone] extension [phone].   [name], MSW, [place] 08/11/2022 12:28 PM

## 2022-08-11 NOTE — Group Note (Signed)
Recreation Therapy Group Note   Group Topic:Other  Group Date: 08/11/2022 Start Time: 1400 End Time: 1435 Facilitators: Rosina Lowenstein, LRT, CTRS Location: Courtyard   Group Description: Feelings Towards Discharge. Patients, LRT and NT all sat outside while listening to music and getting fresh air and sunlight. The group discussed how they feel about going home, their stay at the hospital, things they have learned, and the first thing they will do when they get home.  Goal Area(s) Addressed: Patient will acknowledge the benefits of sunlight and fresh air to mental health.  Patient will reflect on previous life events surrounding their hospital stay. Patients will communicate with peers and LRT.    Affect/Mood: Appropriate   Participation Level: Active and Engaged   Participation Quality: Independent   Behavior: Appropriate, Calm, and Cooperative   Speech/Thought Process: Flight of ideas   Insight: Fair   Judgement: Fair    Modes of Intervention: Guided Discussion   Patient Response to Interventions:  Attentive, Interested , Receptive, and Resistant    Education Outcome:  Acknowledges education   Clinical Observations/Individualized Feedback: [name] was active in their participation of session activities and group discussion. Pt shared that he is [name] and that he is going to buy a Cadillac car when he leaves the hospital. Pt is pleasant and interacted well with LRT and peers duration of session.   Plan: Continue to engage patient in RT group sessions 2-3x/week.   Rosina Lowenstein, LRT, CTRS 08/11/2022 3:11 PM

## 2022-08-11 NOTE — Plan of Care (Signed)
  Problem: Nutrition: Goal: Adequate nutrition will be maintained Outcome: Progressing   Problem: Coping: Goal: Level of anxiety will decrease Outcome: Not Progressing   Problem: Safety: Goal: Ability to remain free from injury will improve Outcome: Progressing   

## 2022-08-11 NOTE — BH IP Treatment Plan (Signed)
Interdisciplinary Treatment and Diagnostic Plan Update  08/11/2022 Time of Session: 9:45 AM  [name] MRN: [id number]  Principal Diagnosis: Schizoaffective disorder, bipolar type (HCC)  Secondary Diagnoses: Principal Problem:   Schizoaffective disorder, bipolar type (HCC)   Current Medications:  Current Facility-Administered Medications  Medication Dose Route Frequency Provider Last Rate Last Admin   amantadine (SYMMETREL) capsule 100 mg  100 mg Oral BID Ardis Hughs, NP   100 mg at 08/10/22 2253   [START ON 08/12/2022] amLODipine (NORVASC) tablet 10 mg  10 mg Oral Daily Sarina Ill, DO       atorvastatin (LIPITOR) tablet 40 mg  40 mg Oral QHS Ardis Hughs, NP       carbamazepine (TEGRETOL XR) 12 hr tablet 400 mg  400 mg Oral QHS Ardis Hughs, NP   400 mg at 08/10/22 2254   cloNIDine (CATAPRES) tablet 0.1 mg  0.1 mg Oral Q8H PRN Sarina Ill, DO       diphenhydrAMINE (BENADRYL) capsule 25 mg  25 mg Oral Q6H PRN Ardis Hughs, NP   25 mg at 08/10/22 2011   fluticasone furoate-vilanterol (BREO ELLIPTA) 200-25 MCG/ACT 1 puff  1 puff Inhalation Daily Ardis Hughs, NP       losartan (COZAAR) tablet 100 mg  100 mg Oral Daily Ardis Hughs, NP       nicotine (NICODERM CQ - dosed in mg/24 hours) patch 21 mg  21 mg Transdermal Daily Ardis Hughs, NP       OLANZapine (ZYPREXA) tablet 10 mg  10 mg Oral Q6H PRN Sarina Ill, DO       risperiDONE (RISPERDAL M-TABS) disintegrating tablet 2 mg  2 mg Oral Q8H PRN Ardis Hughs, NP   2 mg at 08/11/22 0012   And   ziprasidone (GEODON) injection 20 mg  20 mg Intramuscular PRN Ardis Hughs, NP       risperiDONE (RISPERDAL) tablet 3 mg  3 mg Oral [id number] Ardis Hughs, NP   3 mg at 08/11/22 0931   traZODone (DESYREL) tablet 100 mg  100 mg Oral QHS PRN Ardis Hughs, NP   100 mg at 08/10/22 2011   PTA Medications: Medications Prior to Admission  Medication  Sig Dispense Refill Last Dose   amantadine (SYMMETREL) 100 MG capsule Take 1 capsule (100 mg total) by mouth 2 (two) times daily. 60 capsule 3    amLODipine (NORVASC) 5 MG tablet Take 1 tablet (5 mg total) by mouth daily. 30 tablet 1    atorvastatin (LIPITOR) 40 MG tablet Take 1 tablet (40 mg total) by mouth at bedtime. 30 tablet 1    carbamazepine (TEGRETOL XR) 400 MG 12 hr tablet Take 1 tablet (400 mg total) by mouth at bedtime. 30 tablet 3    divalproex (DEPAKOTE) 250 MG DR tablet Take 250 mg by mouth 3 (three) times daily.      fluticasone furoate-vilanterol (BREO ELLIPTA) 200-25 MCG/ACT AEPB Inhale 1 puff into the lungs daily. 1 each 1    losartan (COZAAR) 100 MG tablet Take 1 tablet (100 mg total) by mouth daily. 30 tablet 3    Melatonin 10 MG TABS Take 10 mg by mouth at bedtime.      QUEtiapine (SEROQUEL) 25 MG tablet Take 25 mg by mouth at bedtime.      risperiDONE (RISPERDAL) 3 MG tablet Take 1 tablet (3 mg total) by mouth 2 (two) times daily at 8 am and  4 pm. 60 tablet 3    traZODone (DESYREL) 100 MG tablet Take 1 tablet (100 mg total) by mouth at bedtime as needed for sleep. (Patient not taking: Reported on 08/06/2022) 30 tablet 3     Patient Stressors: Health problems   Medication change or noncompliance    Patient Strengths: Active sense of humor  Motivation for treatment/growth   Treatment Modalities: Medication Management, Group therapy, Case management,  1 to 1 session with clinician, Psychoeducation, Recreational therapy.   Physician Treatment Plan for Primary Diagnosis: Schizoaffective disorder, bipolar type (HCC) Long Term Goal(s): Improvement in symptoms so as ready for discharge   Short Term Goals: Ability to identify changes in lifestyle to reduce recurrence of condition will improve Ability to verbalize feelings will improve Ability to disclose and discuss suicidal ideas Ability to demonstrate self-control will improve Ability to identify and develop effective  coping behaviors will improve Ability to maintain clinical measurements within normal limits will improve Compliance with prescribed medications will improve Ability to identify triggers associated with substance abuse/mental health issues will improve  Medication Management: Evaluate patient's response, side effects, and tolerance of medication regimen.  Therapeutic Interventions: 1 to 1 sessions, Unit Group sessions and Medication administration.  Evaluation of Outcomes: Not Progressing  Physician Treatment Plan for Secondary Diagnosis: Principal Problem:   Schizoaffective disorder, bipolar type (HCC)  Long Term Goal(s): Improvement in symptoms so as ready for discharge   Short Term Goals: Ability to identify changes in lifestyle to reduce recurrence of condition will improve Ability to verbalize feelings will improve Ability to disclose and discuss suicidal ideas Ability to demonstrate self-control will improve Ability to identify and develop effective coping behaviors will improve Ability to maintain clinical measurements within normal limits will improve Compliance with prescribed medications will improve Ability to identify triggers associated with substance abuse/mental health issues will improve     Medication Management: Evaluate patient's response, side effects, and tolerance of medication regimen.  Therapeutic Interventions: 1 to 1 sessions, Unit Group sessions and Medication administration.  Evaluation of Outcomes: Not Progressing   RN Treatment Plan for Primary Diagnosis: Schizoaffective disorder, bipolar type (HCC) Long Term Goal(s): Knowledge of disease and therapeutic regimen to maintain health will improve  Short Term Goals: Ability to remain free from injury will improve, Ability to verbalize frustration and anger appropriately will improve, Ability to demonstrate self-control, Ability to participate in decision making will improve, Ability to verbalize feelings  will improve, Ability to disclose and discuss suicidal ideas, Ability to identify and develop effective coping behaviors will improve, and Compliance with prescribed medications will improve  Medication Management: RN will administer medications as ordered by provider, will assess and evaluate patient's response and provide education to patient for prescribed medication. RN will report any adverse and/or side effects to prescribing provider.  Therapeutic Interventions: 1 on 1 counseling sessions, Psychoeducation, Medication administration, Evaluate responses to treatment, Monitor vital signs and CBGs as ordered, Perform/monitor CIWA, COWS, AIMS and Fall Risk screenings as ordered, Perform wound care treatments as ordered.  Evaluation of Outcomes: Not Progressing   LCSW Treatment Plan for Primary Diagnosis: Schizoaffective disorder, bipolar type (HCC) Long Term Goal(s): Safe transition to appropriate next level of care at discharge, Engage patient in therapeutic group addressing interpersonal concerns.  Short Term Goals: Engage patient in aftercare planning with referrals and resources, Increase social support, Increase ability to appropriately verbalize feelings, Increase emotional regulation, Facilitate acceptance of mental health diagnosis and concerns, Identify triggers associated with mental health/substance abuse issues, and  Increase skills for wellness and recovery  Therapeutic Interventions: Assess for all discharge needs, 1 to 1 time with Social worker, Explore available resources and support systems, Assess for adequacy in community support network, Educate family and significant other(s) on suicide prevention, Complete Psychosocial Assessment, Interpersonal group therapy.  Evaluation of Outcomes: Not Progressing   Progress in Treatment: Attending groups: Yes. and No. Participating in groups: Yes. and No. Taking medication as prescribed: Yes. Toleration medication:  Yes. Family/Significant other contact made: Yes, individual(s) contacted:  [name]  [phone] , and legal guardian [name] with [employer]  Patient understands diagnosis: No. Discussing patient identified problems/goals with staff: Yes. Medical problems stabilized or resolved: Yes. Denies suicidal/homicidal ideation: Yes. Issues/concerns per patient self-inventory: No. Other: None   New problem(s) identified: No, Describe:  None identified   New Short Term/Long Term Goal(s): elimination of symptoms of psychosis, medication management for mood stabilization; elimination of SI thoughts; development of comprehensive mental wellness plan.   Patient Goals:  Pt unable to participate in treatment team due to active psychosis. Treatment team documents were instead sent over to legal guardian at [employer]   Discharge Plan or Barriers:  CSW to assist with appropriate discharge planning   Reason for Continuation of Hospitalization: Delusions  Medication stabilization  Estimated Length of Stay: 1 to 7 days  Last 3 [place] Suicide Severity Risk Score: Flowsheet Row Admission (Current) from 08/10/2022 in [facility] BE[facility]AL [facility] ED from 08/06/2022 in [facility] Emergency Department at [facility] Admission (Discharged) from 05/06/2022 in [facility] BEHAVIORAL MEDICINE  C-SSRS RISK CATEGORY No Risk No Risk No Risk       Last PHQ 2/9 Scores:     No data to display          Scribe for Treatment Team: [name], Theresia Majors 08/11/2022 2:21 PM
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
## 2022-08-12 DIAGNOSIS — F250 Schizoaffective disorder, bipolar type: Secondary | ICD-10-CM | POA: Diagnosis not present
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
## 2022-08-12 NOTE — Progress Notes (Addendum)
   08/12/22 2129  Psych Admission Type (Psych Patients Only)  Admission Status Involuntary  Psychosocial Assessment  Patient Complaints Suspiciousness;Irritability  Eye Contact Poor  Facial Expression Flat  Affect Labile;Irritable  Speech Aggressive;Loud;Slurred  Interaction Dominating;Cautious  Motor Activity Restless  Appearance/Hygiene Unremarkable  Behavior Characteristics Guarded;Irritable  Mood Irritable;Labile;Suspicious  Thought Process  Coherency Disorganized  Content Preoccupation;Paranoia;Magical thinking  Delusions Grandeur  Perception Hallucinations  Hallucination Auditory;Visual  Judgment Poor  Confusion Moderate  Danger to Self  Current suicidal ideation? Denies   Patient exhibiting emotional lability during this shift. Patient chooses to only speak with one staff member. Patient also demands to be called "Lucifer" "[name]" or "Darth Vader". Patient exhibits paranoia and states that someone is monitoring the calls he is making. Patient preoccupied with discharging from facility and staff has attempted to redirect him several times. Staff encourages patient to follow up with social worker on day shift.

## 2022-08-12 NOTE — [facility] Suicide Risk Assessment (Signed)
[facility] INPATIENT:  Family/Significant Other Suicide Prevention Education  Suicide Prevention Education:  Education Completed; [name] [phone], guardian, has been identified by the patient as the family member/significant other with whom the patient will be residing, and identified as the person(s) who will aid the patient in the event of a mental health crisis (suicidal ideations/suicide attempt).  With written consent from the patient, the family member/significant other has been provided the following suicide prevention education, prior to the and/or following the discharge of the patient.  The suicide prevention education provided includes the following: Suicide risk factors Suicide prevention and interventions National Suicide Hotline telephone number [facility] assessment telephone number [facility] Emergency Assistance 911 [facility] and/or Residential Mobile Crisis Unit telephone number  Request made of family/significant other to: Remove weapons (e.g., guns, rifles, knives), all items previously/currently identified as safety concern.   Remove drugs/medications (over-the-counter, prescriptions, illicit drugs), all items previously/currently identified as a safety concern.  The family member/significant other verbalizes understanding of the suicide prevention education information provided.  The family member/significant other agrees to remove the items of safety concern listed above.  [name] 08/12/2022, 10:15 AM

## 2022-08-12 NOTE — Progress Notes (Signed)
Patient has been yelling from his room continuously since approx 2145. Staff has attempted to verbally de-escalate patient without success. Staff unable to determine what patient is upset about. When approached patient has become more agitated and irritable with staff. Staff unable to address patient by his name without patient becoming agitated and demanding to be called "Darth Vader". When this RN approaches patient, patient reports difficulty falling asleep. PRN Trazodone and PRN Risperdal administered per orders. Patient cooperative and takes medications. Patient thanks this RN.

## 2022-08-12 NOTE — Plan of Care (Signed)
  Problem: Nutrition: Goal: Adequate nutrition will be maintained Outcome: Progressing   Problem: Coping: Goal: Level of anxiety will decrease Outcome: Not Progressing   Problem: Pain Managment: Goal: General experience of comfort will improve Outcome: Not Progressing   

## 2022-08-12 NOTE — Plan of Care (Signed)
D- Patient alert and oriented. Affect is irritable .mood is anxious Denies SI, HI, Pt appears to respond to internal stimuli.Pt is very labile,yelling out  at others. A- Scheduled medications administered to patient, per MD orders. Support and encouragement provided.  Routine safety checks conducted every 15 minutes.  Patient informed to notify staff with problems or concerns. R- No adverse drug reactions noted. Patient contracts for safety at this time. Patient compliant with medications and treatment plan. Patient receptive, calm, and cooperative. Patient interacts well with others on the unit.  Patient remains safe at this time.

## 2022-08-12 NOTE — Group Note (Signed)
Date:  08/12/2022 Time:  8:45 PM  Group Topic/Focus:  Self Care:   The focus of this group is to help patients understand the importance of self-care in order to improve or restore emotional, physical, spiritual, interpersonal, and financial health.    Participation Level:  Active  Participation Quality:  Attentive  Affect:  Appropriate  Cognitive:  Oriented  Insight: Good  Engagement in Group:  Engaged  Modes of Intervention:  Discussion  Additional Comments:  Adult unit workbook pg.[id number] on self-care  Caleesi Kohl D Carlotta Telfair 08/12/2022, 8:45 PM

## 2022-08-12 NOTE — BHH Counselor (Signed)
CSW contacted pt's guardian, who stated she would be able to send letter of approval for guardianship per CSW's request.   CSW inquired about what kind of housing options that they could provide for him, and guardian stated they would meet about pt's case tomorrow with their team and get back to CSW about what they came up with   [name], MSW, [facility] 08/12/2022 10:10 AM

## 2022-08-12 NOTE — Group Note (Signed)
Date:  08/12/2022 Time:  2:58 PM  Group Topic/Focus:  Coping With Mental Health Crisis:   The purpose of this group is to help patients identify strategies for coping with mental health crisis.  Group discusses possible causes of crisis and ways to manage them effectively. Goals Group:   The focus of this group is to help patients establish daily goals to achieve during treatment and discuss how the patient can incorporate goal setting into their daily lives to aide in recovery.    Participation Level:  Active  Participation Quality:  Appropriate  Affect:  Appropriate  Cognitive:  Appropriate  Insight: Appropriate  Engagement in Group:  Engaged  Modes of Intervention:  Discussion  Additional Comments:    Ardelle Anton 08/12/2022, 2:58 PM

## 2022-08-12 NOTE — [facility] Group Notes (Signed)
RT group was not held due to MHT group being in progress in the dayroom. LRT checked in with patients individually afterwards.   Cem Kosman LRT, [address]

## 2022-08-12 NOTE — Progress Notes (Signed)
Patient continues to yell out in his room. Not easily redirectable.

## 2022-08-13 DIAGNOSIS — F250 Schizoaffective disorder, bipolar type: Secondary | ICD-10-CM | POA: Diagnosis not present

## 2022-08-13 MED ORDER — LORAZEPAM 2 MG/ML IJ SOLN
1.0000 mg | Freq: Once | INTRAMUSCULAR | Status: AC
  Administered 2022-08-13: 1 mg via INTRAMUSCULAR
  Filled 2022-08-13: qty 1

## 2022-08-13 MED ORDER — ZIPRASIDONE MESYLATE 20 MG IM SOLR
20.0000 mg | Freq: Once | INTRAMUSCULAR | Status: AC
  Administered 2022-08-13: 20 mg via INTRAMUSCULAR
  Filled 2022-08-13: qty 20

## 2022-08-13 MED ORDER — LORAZEPAM 2 MG/ML IJ SOLN
1.0000 mg | Freq: Four times a day (QID) | INTRAMUSCULAR | Status: DC | PRN
  Administered 2022-08-14 – 2022-08-15 (×3): 1 mg via INTRAMUSCULAR
  Filled 2022-08-13 (×3): qty 1

## 2022-08-13 MED ORDER — ZIPRASIDONE MESYLATE 20 MG IM SOLR
10.0000 mg | Freq: Four times a day (QID) | INTRAMUSCULAR | Status: DC | PRN
  Administered 2022-08-14 – 2022-08-15 (×3): 10 mg via INTRAMUSCULAR
  Filled 2022-08-13 (×3): qty 20

## 2022-08-13 NOTE — Progress Notes (Signed)
Patient has still not slept during this shift. Patient has been encouraged multiple times by staff to lay down. Patient will agree to lay down but often gets back up within minutes. Patient has been playing in the water of his sink. Patient continues to flush his toilet multiple times. Patient yelling out from his room and talking to himself in the mirror. When staff asks patient who he is talking to he responds "Jesus".

## 2022-08-13 NOTE — B[facility] Counselor (Signed)
Adult Comprehensive Assessment  Patient ID: [name], male   DOB: Sep 08, 1960, 62 y.o.   MRN: [id number]  Information Source: Information source: Patient  Current Stressors:  Patient states their primary concerns and needs for treatment are:: Pt reports that he does not want to be here and that he can go to [facility] Patient states their goals for this hospitilization and ongoing recovery are:: Pt reports he wants to go to [facility] or [facility], or an assisted living facility Educational / Learning stressors: Unable to assess Employment / Job issues: Unable to assess Family Relationships: Pt reports he has a brother, and his mom but is not able to tell me what the relationship is like Surveyor, quantity / Lack of resources (include bankruptcy): Pt reports that he has no clothing Housing / Lack of housing: Pt was recently kicked out of his group home and was told he cannot return Physical health (include injuries & life threatening diseases): Unable to assess Social relationships: Unable to assess Substance abuse: Unable to assess Bereavement / Loss: Unable to assess  Living/Environment/Situation:  Living Arrangements: Other (Comment) (Pt currently has no where to live after being dismissed from his group home) Living conditions (as described by patient or guardian): Unable to assess Who else lives in the home?: Unable to assess How long has patient lived in current situation?: Unable to assesss What is atmosphere in current home: Other (Comment)  Family History:  Marital status: Other (comment) Are you sexually active?: No What is your sexual orientation?: "Straight" Has your sexual activity been affected by drugs, alcohol, medication, or emotional stress?: Unable to assess Does patient have children?:  (Unable to assess)  Childhood History:  By whom was/is the patient raised?: Mother Additional childhood history information: Unable to assess Description of patient's relationship  with caregiver when they were a child: Unable to assess Patient's description of current relationship with people who raised him/her: Unable to assess How were you disciplined when you got in trouble as a child/adolescent?: Unable to assess Does patient have siblings?: Yes Number of Siblings: 1 Description of patient's current relationship with siblings: Unable to assess Did patient suffer any verbal/emotional/physical/sexual abuse as a child?: No Did patient suffer from severe childhood neglect?: No Has patient ever been sexually abused/assaulted/raped as an adolescent or adult?: No Was the patient ever a victim of a crime or a disaster?:  (Unable to assess) Witnessed domestic violence?: No Has patient been affected by domestic violence as an adult?: No  Education:  Highest grade of school patient has completed: Unable to assess Currently a student?: No Learning disability?:  (Unable to assess)  Employment/Work Situation:   Employment Situation: On disability Why is Patient on Disability: Pt reports he "gets a check" How Long has Patient Been on Disability: Unable to assess Patient's Job has Been Impacted by Current Illness: No What is the Longest Time Patient has Held a Job?: Unable to assess Where was the Patient Employed at that Time?: Unable to assess Has Patient ever Been in the [employer]?: No  Financial Resources:   Financial resources: Insurance claims handler Does patient have a Lawyer or guardian?: Yes Name of representative payee or guardian: [name] [phone]  Alcohol/Substance Abuse:   What has been your use of drugs/alcohol within the last 12 months?: Unable to assess If attempted suicide, did drugs/alcohol play a role in this?: No Alcohol/Substance Abuse Treatment Hx: Denies past history Has alcohol/substance abuse ever caused legal problems?: No  Social Support System:   Describe  Community Support System: Unable to assess Type of faith/religion: Pt  states "I am satan" How does patient's faith help to cope with current illness?: Unable to assess  Leisure/Recreation:   Do You Have Hobbies?: Yes Leisure and Hobbies: "parties"  Strengths/Needs:   What is the patient's perception of their strengths?: Unable to assess Patient states they can use these personal strengths during their treatment to contribute to their recovery: Unable to assess Patient states these barriers may affect/interfere with their treatment: Unable to assess Patient states these barriers may affect their return to the community: Unable to assess  Discharge Plan:   Currently receiving community mental health services: Yes (From Whom) Patient states concerns and preferences for aftercare planning are: [facility] Patient states they will know when they are safe and ready for discharge when: Unable to assess Does patient have access to transportation?: Yes Does patient have financial barriers related to discharge medications?: No Patient description of barriers related to discharge medications: Unable to assess Will patient be returning to same living situation after discharge?: No  Summary/Recommendations:   Summary and Recommendations (to be completed by the evaluator): Patient is a 62 year old black male from [place], [place]. Patient presents to hospital after having an argument with people in his group home which caused him to be dismissed. He presented to the emergency room at [facility] from [facility] because he was not taking his medications and he was being aggressive towards staff and peers.  After further assessment pt reports that [name] is a good friend of his and can be contacted by CSW.  When CSW contacted [name], he stated patients baseline is quiet and to himself, and every few years, he goes through something similiar where his meds need to be adjusted. He is very agitated and walks around constantly throughout  the unit. His speech is disorganized and hard to understand. He has asked multiple times about discharge to which he was given the same answer. He has also been observed bursting into tears at random.  His main diagnosis is schizoaffective disorder, bipolar type. Recommendations include: crisis stabilization, therapeutic milieu, encourage group attendance and participation, medication management for mood stabilization and development of comprehensive mental wellness plan.  [name]. 08/13/2022

## 2022-08-13 NOTE — Final Progress Note (Signed)
Patient has been very volatile this morning. Ate breakfast and took his risperadol but continues to walk back and forth from his room to the day room talking to himself, yelling at staff and peers, slamming his hands on the desk, threw an unopened juice cup. What set him off was wanting to go home.  States that everyone is following him because they are in love with him. Will speak with doctor regarding prn meds.
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
## 2022-08-13 NOTE — Progress Notes (Signed)
Patient has continued to yell from his room. Patient has come out of his room several times to check the clock. Patient has removed his mattress and moved it to the door of his room. Patient has been flushing the toilet multiple times. Patient remains preoccupied with discharging from facility. Staff has attempted to redirect patient multiple times without success. Patient educated on importance of sleep. Patient adamant that he has been sleeping all night. No evidence of sleep has been observed by any staff members. Patient agreeable to medication. PRN Zyprexa and PRN Benadryl administered per orders.

## 2022-08-13 NOTE — Group Note (Signed)
Recreation Therapy Group Note   Group Topic:Relaxation  Group Date: 08/13/2022 Start Time: 1400 End Time: 1440 Facilitators: Rosina Lowenstein, LRT, CTRS Location:  [facility]  Group Description: Meditation. LRT and patients discussed what they know about meditation and mindfulness. LRT played a Deep Breathing Meditation exercise script for patients to follow along to while the lights are dimmed in the room. LRT and patients discussed how meditation and deep breathing can be used as a coping skill postdischarge to relieve stress and anxiety.   Goal Area(s) Addressed: Patient will practice using relaxation technique. Patient will identify a new coping skill.  Patient will follow multistep directions to reduce anxiety and stress.   Affect/Mood: Appropriate   Participation Level: Active and Engaged   Participation Quality: Independent   Behavior: Appropriate, Calm, and Cooperative   Speech/Thought Process: Coherent   Insight: Good   Judgement: Good   Modes of Intervention: Activity   Patient Response to Interventions:  Attentive and Receptive   Education Outcome:  Acknowledges education   Clinical Observations/Individualized Feedback: Zhyon was active in their participation of session activities and group discussion. Pt shared that he has never done meditation before. Pt appropriately completed exercises and followed along with the audio prompt. Pt interacted well with LRT and peers duration of session.   Plan: Continue to engage patient in RT group sessions 2-3x/week.   Rosina Lowenstein, LRT, CTRS 08/13/2022 2:51 PM

## 2022-08-13 NOTE — BHH Counselor (Signed)
CSW contacted [name] [phone] extension [phone], CSW unable to reach LVM.   CSW contacted [facility] crisis line [phone] extension [phone] to discuss treatment for pt.  Materials engineer, supervisor at [employer]  gave verbal consent for a long acting 25 mg Risperdal Injection.   CSW will inform provider.    [name], MSW, [place] 08/13/2022 10:35 AM
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
## 2022-08-13 NOTE — Group Note (Signed)
Date:  08/13/2022 Time:  8:46 PM  Group Topic/Focus:  Wrap-Up Group:   The focus of this group is to help patients review their daily goal of treatment and discuss progress on daily workbooks. Today we discussed what each pt. was thankful for and how their day was.    Participation Level:  Active  Participation Quality:  Attentive  Affect:  Appropriate  Cognitive:  Appropriate and Oriented  Insight: Improving  Engagement in Group:  Engaged  Modes of Intervention:  Discussion  Additional Comments:  Today we discussed what each pt. was thankful for and how their day was.  Roberto Scales 08/13/2022, 8:46 PM

## 2022-08-14 DIAGNOSIS — F250 Schizoaffective disorder, bipolar type: Secondary | ICD-10-CM | POA: Diagnosis not present

## 2022-08-14 NOTE — Group Note (Signed)
Recreation Therapy Group Note   Group Topic:General Recreation  Group Date: 08/14/2022 Start Time: 1400 End Time: 1450 Facilitators: Rosina Lowenstein, LRT, CTRS Location:  Day Room  Group Description: Bingo. LRT and patients played multiple games of Bingo with music playing in the background. LRT and pts discussed how this could be a leisure interest and the importance of doing things they enjoy post-discharge. Patients received stress balls as bingo prizes.    Goal Area(s) Addressed: Patient will identify leisure interests.  Patient will practice healthy decision making. Patient will engage in recreation activity.    Affect/Mood: Appropriate   Participation Level: Moderate   Participation Quality: Independent   Behavior: Appropriate, Calm, and Cooperative   Speech/Thought Process: Loose association   Insight: Fair   Judgement: Fair    Modes of Intervention: Competitive Play   Patient Response to Interventions:  Attentive and Receptive   Education Outcome:  Acknowledges education   Clinical Observations/Individualized Feedback: [name] was mostly active in their participation of session activities and group discussion. Pt was present and played a couple rounds of bingo before cleaning up his cards an bingo chips and walking to the nurses station. Pt was then observed pacing the hallways talking loudly to himself. Pt did not return to group. Pt interacted well with LRT and peers while present in the craft room.   Plan: Continue to engage patient in RT group sessions 2-3x/week.   Rosina Lowenstein, LRT, CTRS 08/14/2022 3:14 PM
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
## 2022-08-14 NOTE — Group Note (Signed)
Date:  08/14/2022 Time:  3:22 PM  Group Topic/Focus:  Making Healthy Choices:   The focus of this group is to help patients identify negative/unhealthy choices they were using prior to admission and identify positive/healthier coping strategies to replace them upon discharge.    Participation Level:  Did Not Attend    [name] 08/14/2022, 3:22 PM

## 2022-08-14 NOTE — Progress Notes (Signed)

## 2022-08-14 NOTE — BHH Counselor (Signed)
CSW received return call from pt's guardian.   Guardian stated they are looking into places for pt, she has a few that she is looking at [name], [facility], and another.   Guardian reports that they are actively working on it.   Pt states understanding.   CSW to send guardian FL2 so that she can send to placements.   [name], MSW, [place] 08/14/2022 11:17 AM

## 2022-08-14 NOTE — BH Assessment (Signed)
Recreation Therapy Notes  INPATIENT RECREATION THERAPY ASSESSMENT  Patient Details Name: [name] MRN: [id number] DOB: 04/01/1960 Today's Date: 08/14/2022    Able to Participate in Assessment/Interview: No    Rosina Lowenstein 08/14/2022, 8:06 AM

## 2022-08-14 NOTE — BHH Counselor (Signed)
CSW contacted pt's guardian per pt's request. CSW unable to contact, LVM.   CSW contacted [facility] per pt's request. [facility] stated that they did not have pt in their system as a client receiving services.    [name], MSW, [place] 08/14/2022 2:42 PM

## 2022-08-14 NOTE — Progress Notes (Signed)
Patient awake and in dayroom.  Frequently coming to nurse's station to speak with staff.  Patient reports he slept well, but this is not accurate. Speech is mumbled and difficult to understand.  Denies SI/HI.  15 min checks in place for safety.    Patient compliant with scheduled Risperdal.  Patient took other PO medications, but was reluctant. Patient has disorganized thoughts and flight of ideas. Liable mood.    Participated in SW and RT group activities.

## 2022-08-14 NOTE — BHH Counselor (Signed)
CSW contacted pt's guardian. Unable to reach, LVM requesting return call.   [name], MSW, [place] 08/14/2022 11:08 AM

## 2022-08-14 NOTE — Plan of Care (Signed)

## 2022-08-15 DIAGNOSIS — F250 Schizoaffective disorder, bipolar type: Secondary | ICD-10-CM | POA: Diagnosis not present

## 2022-08-15 MED ORDER — DIPHENHYDRAMINE HCL 50 MG/ML IJ SOLN
25.0000 mg | Freq: Once | INTRAMUSCULAR | Status: AC
  Administered 2022-08-15: 25 mg via INTRAMUSCULAR
  Filled 2022-08-15: qty 1

## 2022-08-15 MED ORDER — LORAZEPAM 2 MG/ML IJ SOLN
1.0000 mg | Freq: Three times a day (TID) | INTRAMUSCULAR | Status: DC | PRN
  Administered 2022-08-23 – 2022-08-27 (×5): 1 mg via INTRAMUSCULAR
  Filled 2022-08-15 (×6): qty 1

## 2022-08-15 MED ORDER — DIVALPROEX SODIUM 250 MG PO DR TAB
500.0000 mg | DELAYED_RELEASE_TABLET | Freq: Two times a day (BID) | ORAL | Status: DC
  Administered 2022-08-16 – 2022-09-02 (×32): 500 mg via ORAL
  Filled 2022-08-15 (×37): qty 2

## 2022-08-15 MED ORDER — CHLORPROMAZINE HCL 25 MG/ML IJ SOLN
25.0000 mg | Freq: Three times a day (TID) | INTRAMUSCULAR | Status: DC | PRN
  Administered 2022-08-15 – 2022-08-26 (×5): 25 mg via INTRAMUSCULAR
  Filled 2022-08-15 (×9): qty 1

## 2022-08-15 NOTE — Plan of Care (Signed)
Alert and oriented with confusion. Pacing. Rapid pressured speech. Difficult to redirect. RIS. PO meds given. Ativan and Geodon IM injection given PRN as ordered with min effect. Later given Benadryl 25 mg and Zyprexa po given which are ineffective. Pt remains awake hollering, screaming and slamming doors. Security notified for verbally aggressive and threatening behavior. Plan of care continued.   Problem: Activity: Goal: Risk for activity intolerance will decrease Outcome: Progressing   Problem: Nutrition: Goal: Adequate nutrition will be maintained Outcome: Progressing   Problem: Coping: Goal: Level of anxiety will decrease Outcome: Progressing   Problem: Elimination: Goal: Will not experience complications related to bowel motility Outcome: Progressing Goal: Will not experience complications related to urinary retention Outcome: Progressing   Problem: Pain Managment: Goal: General experience of comfort will improve Outcome: Progressing

## 2022-08-15 NOTE — Group Note (Signed)
[facility] LCSW Group Therapy Note   Group Date: 08/14/2022 Start Time: 1315 End Time: 1400   Type of Therapy/Topic:  Group Therapy:  Emotion Regulation  Participation Level:  Active   Mood:  Description of Group:    The purpose of this group is to assist patients in learning to regulate negative emotions and experience positive emotions. Patients will be guided to discuss ways in which they have been vulnerable to their negative emotions. These vulnerabilities will be juxtaposed with experiences of positive emotions or situations, and patients challenged to use positive emotions to combat negative ones. Special emphasis will be placed on coping with negative emotions in conflict situations, and patients will process healthy conflict resolution skills.  Therapeutic Goals: Patient will identify two positive emotions or experiences to reflect on in order to balance out negative emotions:  Patient will label two or more emotions that they find the most difficult to experience:  Patient will be able to demonstrate positive conflict resolution skills through discussion or role plays:   Summary of Patient Progress:   Due to pt's current mental status, they were not able to verbalize feelings, but were able to communicate emotions through movement and facial expressions.     Therapeutic Modalities:   Cognitive Behavioral Therapy Feelings Identification Dialectical Behavioral Therapy   [name], LCSWA
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
## 2022-08-15 NOTE — Group Note (Signed)
Date:  08/15/2022 Time:  9:03 PM  Group Topic/Focus:  Self Care:   The focus of this group is to help patients understand the importance of self-care in order to improve or restore emotional, physical, spiritual, interpersonal, and financial health.    Participation Level:  Active  Participation Quality:  Appropriate  Affect:  Appropriate  Cognitive:  Appropriate  Insight: Appropriate  Engagement in Group:  Engaged  Modes of Intervention:  Limit-setting  Additional Comments:    [name] 08/15/2022, 9:03 PM

## 2022-08-15 NOTE — [place] FL2 (Signed)
[place] MEDICAID FL2 LEVEL OF CARE FORM     IDENTIFICATION  Patient Name: [name] Birthdate: Dec 09, 1960 Sex: male Admission Date (Current Location): 08/10/2022  [facility] and [place] Number:  Chiropodist and Address:  [facility], [address] [place], [place] [address]      Provider Number: [id number]  Attending Physician Name and Address:  Sarina Ill,*  Relative Name and Phone Number:       Current Level of Care: Hospital Recommended Level of Care: [facility] Prior Approval Number:    Date Approved/Denied:   PASRR Number:    Discharge Plan: Other (Comment) (Family Care Home)    Current Diagnoses: Patient Active Problem List   Diagnosis Date Noted   Schizoaffective disorder, bipolar type (HCC) 06/15/2016   Hypertension 06/13/2016   Tobacco use disorder 10/04/2015   Noncompliance 10/03/2015    Orientation RESPIRATION BLADDER Height & Weight        Normal Continent Weight: 144 lb 8 oz (65.5 kg) Height:  5\' 7"  (170.2 cm)  BEHAVIORAL SYMPTOMS/MOOD NEUROLOGICAL BOWEL NUTRITION STATUS      Continent    AMBULATORY STATUS COMMUNICATION OF NEEDS Skin   Independent Verbally Normal                       Personal Care Assistance Level of Assistance              Functional Limitations Info  Speech     Speech Info: Adequate (Pt can be hard to understand at times due to slurred speech)    SPECIAL CARE FACTORS FREQUENCY                       Contractures Contractures Info: Not present    Additional Factors Info  Psychotropic, Allergies   Allergies Info: Haldol Psychotropic Info: divalproex (DEPAKOTE) DR tablet 500 mg, risperiDONE (RISPERDAL) tablet 3 mg         Current Medications (08/15/2022):  This is the current hospital active medication list Current Facility-Administered Medications  Medication Dose Route Frequency Provider Last Rate Last Admin   amantadine (SYMMETREL)  capsule 100 mg  100 mg Oral BID Ardis Hughs, NP   100 mg at 08/14/22 2106   amLODipine (NORVASC) tablet 10 mg  10 mg Oral Daily Sarina Ill, DO   10 mg at 08/14/22 0865   atorvastatin (LIPITOR) tablet 40 mg  40 mg Oral QHS Ardis Hughs, NP   40 mg at 08/14/22 2106   carbamazepine (TEGRETOL XR) 12 hr tablet 400 mg  400 mg Oral QHS Ardis Hughs, NP   400 mg at 08/14/22 2107   cloNIDine (CATAPRES) tablet 0.1 mg  0.1 mg Oral Q8H PRN Sarina Ill, DO       diphenhydrAMINE (BENADRYL) capsule 25 mg  25 mg Oral Q6H PRN Ardis Hughs, NP   25 mg at 08/15/22 0444   divalproex (DEPAKOTE) DR tablet 500 mg  500 mg Oral Q12H Parmar, Meenakshi, MD       fluticasone furoate-vilanterol (BREO ELLIPTA) 200-25 MCG/ACT 1 puff  1 puff Inhalation Daily Vernard Gambles H, NP       ziprasidone (GEODON) injection 10 mg  10 mg Intramuscular Q6H PRN Lewanda Rife, MD   10 mg at 08/15/22 1325   And   LORazepam (ATIVAN) injection 1 mg  1 mg Intramuscular Q6H PRN Lewanda Rife, MD   1 mg at  08/15/22 1324   losartan (COZAAR) tablet 100 mg  100 mg Oral Daily Vernard Gambles H, NP   100 mg at 08/14/22 9562   nicotine (NICODERM CQ - dosed in mg/24 hours) patch 21 mg  21 mg Transdermal Daily Ardis Hughs, NP       OLANZapine (ZYPREXA) tablet 10 mg  10 mg Oral Q6H PRN Sarina Ill, DO   10 mg at 08/15/22 0444   risperiDONE (RISPERDAL) tablet 3 mg  3 mg Oral BH-q8a4p Ardis Hughs, NP   3 mg at 08/15/22 1308   traZODone (DESYREL) tablet 100 mg  100 mg Oral QHS PRN Ardis Hughs, NP   100 mg at 08/14/22 2108     Discharge Medications: Please see discharge summary for a list of discharge medications.  Relevant Imaging Results:  Relevant Lab Results:   Additional Information Social Security number [id number]  [name], [place]

## 2022-08-15 NOTE — Progress Notes (Signed)
Patient present in dayroom.  Appears to be responding to internal stimuli.  Loud, pressured speech with flight of ideas.  Restless and pacing on unit. Disruptive to milieu and needs frequent re-direction.  Per previous shift, patient has not slept more than a couple of hours.  Only compliant with Risperdal. Refused all other medications 15 min checks in place for safety.   Patient is fixated on discharge and continuously asks to speak with the social worker.    Behavioral issues continued throughout shift.  Door slamming, yelling and verbal threats.  IM medication that was given was ineffective.  IM Thorazine ordered for this evening.

## 2022-08-15 NOTE — Progress Notes (Signed)
Patient becoming increasingly agitated.  Patient is at nurse's station yelling and threatening staff.  Attempts to verbally de-escalate behaviors were unsuccessful.  Psychiatrist made aware. Security officer called to unit.  IM medications administered as ordered with only show of support.

## 2022-08-15 NOTE — Plan of Care (Signed)
  Problem: Safety: Goal: Ability to remain free from injury will improve Outcome: Progressing   Problem: Skin Integrity: Goal: Risk for impaired skin integrity will decrease Outcome: Progressing   Problem: Education: Goal: Knowledge of General Education information will improve Description: Including pain rating scale, medication(s)/side effects and non-pharmacologic comfort measures Outcome: Not Progressing   Problem: Health Behavior/Discharge Planning: Goal: Ability to manage health-related needs will improve Outcome: Not Progressing
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
## 2022-08-15 NOTE — BHH Counselor (Signed)
CSW faxed FL2 to [employer] at [phone].

## 2022-08-15 NOTE — BHH Counselor (Signed)
CSW called [employer] for [name], who stated their are no updates regarding placement from their side for pt.   CSW to follow-up on Monday.   [name], MSW, [place] 08/15/2022 3:16 PM

## 2022-08-16 LAB — LIPID PANEL
Cholesterol: 159 mg/dL (ref 0–200)
HDL: 59 mg/dL (ref >40)
LDL Cholesterol: 84 mg/dL (ref 0–99)
Total CHOL/HDL Ratio: 2.7 RATIO
Triglycerides: 79 mg/dL (ref <150)
VLDL: 16 mg/dL (ref 0–40)

## 2022-08-16 NOTE — Progress Notes (Signed)
   08/16/22 1000  Psych Admission Type (Psych Patients Only)  Admission Status Involuntary  Psychosocial Assessment  Patient Complaints Worrying;Restlessness;Irritability;Hyperactivity;Anxiety  Eye Contact Brief  Facial Expression Anxious  Affect Anxious;Preoccupied  Speech Slurred;Pressured  Interaction Demanding  Motor Activity Hyperactive;Pacing;Restless  Appearance/Hygiene Unremarkable  Behavior Characteristics Anxious;Hyperactive;Pacing;Restless  Mood Anxious;Irritable;Preoccupied  Thought Process  Coherency Disorganized;Flight of ideas  Content Preoccupation;Delusions  Delusions Paranoid  Perception Hallucinations  Hallucination Auditory;Visual  Judgment Poor  Confusion Moderate  Danger to Self  Current suicidal ideation? Denies  Agreement Not to Harm Self Yes  Danger to Others  Danger to Others None reported or observed   Patient remains hyperactive. Tolerated all scheduled medications. Attended group. Occasional yelling outbursts and slamming doors. Easily redirectable.
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
## 2022-08-16 NOTE — Plan of Care (Signed)

## 2022-08-16 NOTE — Group Note (Deleted)
Date:  08/16/2022 Time:  2:12 AM  Group Topic/Focus:  Wrap-Up Group:   The focus of this group is to help patients review their daily goal of treatment and discuss progress on daily workbooks.     Participation Level:  {BHH PARTICIPATION ZOXWR:60454}  Participation Quality:  {BHH PARTICIPATION QUALITY:22265}  Affect:  {BHH AFFECT:22266}  Cognitive:  {BHH COGNITIVE:22267}  Insight: {BHH Insight2:20797}  Engagement in Group:  {BHH ENGAGEMENT IN UJWJX:91478}  Modes of Intervention:  {BHH MODES OF INTERVENTION:22269}  Additional Comments:  ***  Maglione,Arleth Mccullar E 08/16/2022, 2:12 AM

## 2022-08-16 NOTE — Group Note (Signed)
Date:  08/16/2022 Time:  8:43 PM  Group Topic/Focus:  Conflict Resolution:   The focus of this group is to discuss the conflict resolution process and how it may be used upon discharge. Healthy Communication:   The focus of this group is to discuss communication, barriers to communication, as well as healthy ways to communicate with others. Making Healthy Choices:   The focus of this group is to help patients identify negative/unhealthy choices they were using prior to admission and identify positive/healthier coping strategies to replace them upon discharge. Managing Feelings:   The focus of this group is to identify what feelings patients have difficulty handling and develop a plan to handle them in a healthier way upon discharge. Personal Choices and Values:   The focus of this group is to help patients assess and explore the importance of values in their lives, how their values affect their decisions, how they express their values and what opposes their expression.    Participation Level:  None  Participation Quality:  Intrusive  Affect:  Angry, Defensive, and Irritable  Cognitive:  Delusional  Insight: Lacking  Engagement in Group:  Distracting  Modes of Intervention:  Reality Testing  Additional Comments:    [name] 08/16/2022, 8:43 PM

## 2022-08-16 NOTE — Progress Notes (Signed)
Patient remains disorganized in thought process, he has been restless on the shift, he refused most of his medication ordered PO. He received an IM injection of Thorazine x1 with a show of support with little effect. He needs constant redirection to stay on task, he has rapid and pressured speech. He told current Clinical research associate "that he is [name] and Obama son". He has been in his room talking loudly to himself through out most of the night.

## 2022-08-16 NOTE — BH IP Treatment Plan (Unsigned)
Interdisciplinary Treatment and Diagnostic Plan Update  08/16/2022 Time of Session: 10:15 am [name] MRN: [id number]  Principal Diagnosis: Schizoaffective disorder, bipolar type [facility])  Secondary Diagnoses: Principal Problem:   Schizoaffective disorder, bipolar type (HCC)   Current Medications:  Current Facility-Administered Medications  Medication Dose Route Frequency Provider Last Rate Last Admin   amantadine (SYMMETREL) capsule 100 mg  100 mg Oral BID Ardis Hughs, NP   100 mg at 08/16/22 7829   amLODipine (NORVASC) tablet 10 mg  10 mg Oral Daily Sarina Ill, DO   10 mg at 08/16/22 5621   atorvastatin (LIPITOR) tablet 40 mg  40 mg Oral QHS Ardis Hughs, NP   40 mg at 08/14/22 2106   carbamazepine (TEGRETOL XR) 12 hr tablet 400 mg  400 mg Oral QHS Ardis Hughs, NP   400 mg at 08/14/22 2107   chlorproMAZINE (THORAZINE) injection 25 mg  25 mg Intramuscular TID PRN Lewanda Rife, MD   25 mg at 08/15/22 2314   And   LORazepam (ATIVAN) injection 1 mg  1 mg Intramuscular Q8H PRN Lewanda Rife, MD       cloNIDine (CATAPRES) tablet 0.1 mg  0.1 mg Oral Q8H PRN Sarina Ill, DO       diphenhydrAMINE (BENADRYL) capsule 25 mg  25 mg Oral Q6H PRN Ardis Hughs, NP   25 mg at 08/15/22 1952   divalproex (DEPAKOTE) DR tablet 500 mg  500 mg Oral Q12H Lewanda Rife, MD   500 mg at 08/16/22 0938   fluticasone furoate-vilanterol (BREO ELLIPTA) 200-25 MCG/ACT 1 puff  1 puff Inhalation Daily Ardis Hughs, NP       losartan (COZAAR) tablet 100 mg  100 mg Oral Daily Vernard Gambles H, NP   100 mg at 08/16/22 3086   nicotine (NICODERM CQ - dosed in mg/24 hours) patch 21 mg  21 mg Transdermal Daily Vernard Gambles H, NP       risperiDONE (RISPERDAL) tablet 3 mg  3 mg Oral [id number] Ardis Hughs, NP   3 mg at 08/16/22 5784   traZODone (DESYREL) tablet 100 mg  100 mg Oral QHS PRN Ardis Hughs, NP   100 mg at 08/15/22 1952   PTA  Medications: Medications Prior to Admission  Medication Sig Dispense Refill Last Dose   amantadine (SYMMETREL) 100 MG capsule Take 1 capsule (100 mg total) by mouth 2 (two) times daily. 60 capsule 3    amLODipine (NORVASC) 5 MG tablet Take 1 tablet (5 mg total) by mouth daily. 30 tablet 1    atorvastatin (LIPITOR) 40 MG tablet Take 1 tablet (40 mg total) by mouth at bedtime. 30 tablet 1    carbamazepine (TEGRETOL XR) 400 MG 12 hr tablet Take 1 tablet (400 mg total) by mouth at bedtime. 30 tablet 3    divalproex (DEPAKOTE) 250 MG DR tablet Take 250 mg by mouth 3 (three) times daily.      fluticasone furoate-vilanterol (BREO ELLIPTA) 200-25 MCG/ACT AEPB Inhale 1 puff into the lungs daily. 1 each 1    losartan (COZAAR) 100 MG tablet Take 1 tablet (100 mg total) by mouth daily. 30 tablet 3    Melatonin 10 MG TABS Take 10 mg by mouth at bedtime.      QUEtiapine (SEROQUEL) 25 MG tablet Take 25 mg by mouth at bedtime.      risperiDONE (RISPERDAL) 3 MG tablet Take 1 tablet (3 mg total) by mouth 2 (two) times daily  at 8 am and 4 pm. 60 tablet 3    traZODone (DESYREL) 100 MG tablet Take 1 tablet (100 mg total) by mouth at bedtime as needed for sleep. (Patient not taking: Reported on 08/06/2022) 30 tablet 3     Patient Stressors: Health problems   Medication change or noncompliance    Patient Strengths: Active sense of humor  Motivation for treatment/growth   Treatment Modalities: Medication Management, Group therapy, Case management,  1 to 1 session with clinician, Psychoeducation, Recreational therapy.   Physician Treatment Plan for Primary Diagnosis: Schizoaffective disorder, bipolar type (HCC) Long Term Goal(s): Improvement in symptoms so as ready for discharge   Short Term Goals: Ability to identify changes in lifestyle to reduce recurrence of condition will improve Ability to verbalize feelings will improve Ability to disclose and discuss suicidal ideas Ability to demonstrate self-control  will improve Ability to identify and develop effective coping behaviors will improve Ability to maintain clinical measurements within normal limits will improve Compliance with prescribed medications will improve Ability to identify triggers associated with substance abuse/mental health issues will improve  Medication Management: Evaluate patient's response, side effects, and tolerance of medication regimen.  Therapeutic Interventions: 1 to 1 sessions, Unit Group sessions and Medication administration.  Evaluation of Outcomes: Not Progressing  Physician Treatment Plan for Secondary Diagnosis: Principal Problem:   Schizoaffective disorder, bipolar type (HCC)  Long Term Goal(s): Improvement in symptoms so as ready for discharge   Short Term Goals: Ability to identify changes in lifestyle to reduce recurrence of condition will improve Ability to verbalize feelings will improve Ability to disclose and discuss suicidal ideas Ability to demonstrate self-control will improve Ability to identify and develop effective coping behaviors will improve Ability to maintain clinical measurements within normal limits will improve Compliance with prescribed medications will improve Ability to identify triggers associated with substance abuse/mental health issues will improve     Medication Management: Evaluate patient's response, side effects, and tolerance of medication regimen.  Therapeutic Interventions: 1 to 1 sessions, Unit Group sessions and Medication administration.  Evaluation of Outcomes: Not Progressing   RN Treatment Plan for Primary Diagnosis: Schizoaffective disorder, bipolar type (HCC) Long Term Goal(s): Knowledge of disease and therapeutic regimen to maintain health will improve  Short Term Goals: Ability to remain free from injury will improve, Ability to verbalize frustration and anger appropriately will improve, Ability to demonstrate self-control, Ability to participate in  decision making will improve, Ability to verbalize feelings will improve, Ability to disclose and discuss suicidal ideas, Ability to identify and develop effective coping behaviors will improve, and Compliance with prescribed medications will improve  Medication Management: RN will administer medications as ordered by provider, will assess and evaluate patient's response and provide education to patient for prescribed medication. RN will report any adverse and/or side effects to prescribing provider.  Therapeutic Interventions: 1 on 1 counseling sessions, Psychoeducation, Medication administration, Evaluate responses to treatment, Monitor vital signs and CBGs as ordered, Perform/monitor CIWA, COWS, AIMS and Fall Risk screenings as ordered, Perform wound care treatments as ordered.  Evaluation of Outcomes: Not Progressing   LCSW Treatment Plan for Primary Diagnosis: Schizoaffective disorder, bipolar type (HCC) Long Term Goal(s): Safe transition to appropriate next level of care at discharge, Engage patient in therapeutic group addressing interpersonal concerns.  Short Term Goals: Engage patient in aftercare planning with referrals and resources, Increase social support, Increase ability to appropriately verbalize feelings, Increase emotional regulation, Facilitate acceptance of mental health diagnosis and concerns, and Increase skills for wellness  and recovery  Therapeutic Interventions: Assess for all discharge needs, 1 to 1 time with Child psychotherapist, Explore available resources and support systems, Assess for adequacy in community support network, Educate family and significant other(s) on suicide prevention, Complete Psychosocial Assessment, Interpersonal group therapy.  Evaluation of Outcomes: Not Progressing   Progress in Treatment: Attending groups: Yes. Participating in groups: Yes. Taking medication as prescribed: No. Toleration medication: Yes. Family/Significant other contact made:  Yes, individual(s) contacted:  [name] ,[phone] extension [phone] Patient understands diagnosis: Yes. Discussing patient identified problems/goals with staff: Yes. Medical problems stabilized or resolved: Yes. Denies suicidal/homicidal ideation: Yes Issues/concerns per patient self-inventory: No Other: none  New problem(s) identified: No, Describe:  none  New Short Term/Long Term Goal(s): Update 08/16/22: none at this time  Patient Goals:  Update 08/16/22: none at this time  Discharge Plan or Barriers: Update 08/16/22: none at this time  Reason for Continuation of Hospitalization: Anxiety Hallucinations Medication stabilization  Estimated Length of Stay: Update 08/16/22: TBD  Last 3 [place] Suicide Severity Risk Score: Flowsheet Row Admission (Current) from 08/10/2022 in [facility] BEHAVIORAL MEDICINE ED from 08/06/2022 in [facility] Emergency Department at [facility] Admission (Discharged) from 05/06/2022 in [facility] BEHAVIORAL MEDICINE  C-SSRS RISK CATEGORY Error: Question 6 not populated No Risk No Risk       Last PHQ 2/9 Scores:     No data to display          Scribe for Treatment Team: Marshell Levan, LCSW 08/16/2022 10:15 AM

## 2022-08-16 NOTE — Progress Notes (Signed)
   08/16/22 2152  Psych Admission Type (Psych Patients Only)  Admission Status Involuntary  Psychosocial Assessment  Patient Complaints Anxiety;Agitation;Crying spells;Hyperactivity;Irritability;Restlessness;Suspiciousness  Eye Contact Glaring  Facial Expression Anxious;Worried  Affect Anxious;Irritable;Labile;Preoccupied  Speech Aggressive;Argumentative;Pressured;Loud;Slurred;Tangential  Interaction Assertive;Demanding  Motor Activity Hyperactive;Pacing;Restless  Appearance/Hygiene Disheveled  Behavior Characteristics Anxious;Hyperactive;Impulsive  Mood Anxious;Threatening;Preoccupied;Irritable  Thought Process  Coherency Disorganized;Flight of ideas;Loose associations;Tangential  Content Preoccupation;Paranoia;Magical thinking;Obsessions;Delusions  Delusions Paranoid;Grandeur  Perception Hallucinations  Hallucination Auditory;Visual  Judgment Poor  Confusion Moderate  Danger to Self  Current suicidal ideation? Denies  Danger to Others  Danger to Others None reported or observed

## 2022-08-16 NOTE — Plan of Care (Signed)

## 2022-08-17 DIAGNOSIS — F250 Schizoaffective disorder, bipolar type: Secondary | ICD-10-CM | POA: Diagnosis not present
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
## 2022-08-17 NOTE — Group Note (Signed)
LCSW Group Therapy Note  Group Date: 08/16/2022 Start Time: 1310 End Time: 1345   Type of Therapy and Topic:  Group Therapy - Healthy vs Unhealthy Coping Skills  Participation Level:  Active   Description of Group The focus of this group was to determine what unhealthy coping techniques typically are used by group members and what healthy coping techniques would be helpful in coping with various problems. Patients were guided in becoming aware of the differences between healthy and unhealthy coping techniques. Patients were asked to identify 2-3 healthy coping skills they would like to learn to use more effectively.  Therapeutic Goals Patients learned that coping is what human beings do all day long to deal with various situations in their lives Patients defined and discussed healthy vs unhealthy coping techniques Patients identified their preferred coping techniques and identified whether these were healthy or unhealthy Patients determined 2-3 healthy coping skills they would like to become more familiar with and use more often. Patients provided support and ideas to each other   Summary of Patient Progress: Patient attended group. During group, [name] expressed that [name] was his favorite season and that one of his favorite coping mechanisms is when someone prays for him. Patient proved open to input from peers and feedback from CSW. Patient demonstrated poor insight into the subject matter, but tried to be respectful of peers, and participated throughout the entire session.   Therapeutic Modalities Cognitive Behavioral Therapy Motivational Interviewing  Azucena Kuba, LCSWA 08/17/2022  9:04 AM

## 2022-08-17 NOTE — Progress Notes (Signed)
Pt continues to respond to internal stimuli. Today, pt has gone in his room several times and has been yelling at some one who is not there. At times pt is agitated and loud. Pt stated he wants discharge. Pt has taken his medication  today without difficulty.

## 2022-08-17 NOTE — Plan of Care (Signed)
D- Patient alert and oriented. Only to himself/situation Affect is irritable mood is labile Denies SI, HI, AVH, and pain.  A- Scheduled medications administered to patient, per MD orders. Support and encouragement provided.  Routine safety checks conducted every 15 minutes.  Patient informed to notify staff with problems or concerns. R- No adverse drug reactions noted. Patient contracts for safety at this time. Patient compliant with medications and treatment plan. Patient receptive, calm, and cooperative. Patient interacts well with others on the unit.  Patient remains safe at this time.

## 2022-08-18 LAB — HEMOGLOBIN A1C
Hgb A1c MFr Bld: 5.2 % (ref 4.8–5.6)
Mean Plasma Glucose: 103 mg/dL

## 2022-08-18 NOTE — Group Note (Signed)
Recreation Therapy Group Note   Group Topic:Relaxation  Group Date: 08/18/2022 Start Time: 1400 End Time: 1440 Facilitators: Rosina Lowenstein, LRT, CTRS Location: Day room   Group Description: Chair Yoga. LRT and patients discussed the benefits of yoga and how it differs from strength exercises. LRT educated patients on the mental and physical benefits of yoga and deep breathing and how it can be used as a Associate Professor. LRT and patients followed along to a guided yoga session on the television that focused on all parts of the body, as well as deep breathing. Pt encouraged to stop movement at any time if they feel discomfort or pain.   Goal Area(s) Addressed: Patient will practice using relaxation technique. Patient will identify a new coping skill.  Patient will follow multistep directions to reduce anxiety and stress.   Affect/Mood: Flat   Participation Level: Minimal   Participation Quality: Independent   Behavior: Alert and Disinterested   Speech/Thought Process: Disorganized   Insight: Limited   Judgement: Limited   Modes of Intervention: Activity   Patient Response to Interventions:  Disengaged   Education Outcome:  In group clarification offered    Clinical Observations/Individualized Feedback: [name] was not active in their participation of session activities and group discussion. Pt sat talking to himself and responding to things that were not there. Pt looked to be asleep at some point. Pt did not complete any exercises. However, pt was not a disruption to the group and remained fairly quiet.    Plan: Continue to engage patient in RT group sessions 2-3x/week.   Rosina Lowenstein, LRT, CTRS 08/18/2022 3:12 PM
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
## 2022-08-18 NOTE — Progress Notes (Signed)
Patient is present in the dayroom for breakfast.  Cooperative and pleasant with staff.  Reports he slept better last night.  Good appetite.  Denies SI/HI.  Appears to be responding to internal stimuli.  Compliant with scheduled PO medications.  15 min checks in place for safety.  Patient is present in the milieu.    Patient had 2 outbursts of yelling and disrupting the milieu.    Continues to fixate on discharge and wanting to speak with his guardian and Child psychotherapist.

## 2022-08-18 NOTE — Plan of Care (Signed)
  Problem: Nutrition: Goal: Adequate nutrition will be maintained Outcome: Progressing   Problem: Coping: Goal: Level of anxiety will decrease Outcome: Progressing   Problem: Pain Managment: Goal: General experience of comfort will improve Outcome: Progressing   Problem: Skin Integrity: Goal: Risk for impaired skin integrity will decrease Outcome: Progressing   

## 2022-08-18 NOTE — Group Note (Signed)
Date:  08/18/2022 Time:  11:34 PM  Group Topic/Focus:  Building Self Esteem:   The Focus of this group is helping patients become aware of the effects of self-esteem on their lives, the things they and others do that enhance or undermine their self-esteem, seeing the relationship between their level of self-esteem and the choices they make and learning ways to enhance self-esteem. Coping With Mental Health Crisis:   The purpose of this group is to help patients identify strategies for coping with mental health crisis.  Group discusses possible causes of crisis and ways to manage them effectively. Crisis Planning:   The purpose of this group is to help patients create a crisis plan for use upon discharge or in the future, as needed. Developing a Wellness Toolbox:   The focus of this group is to help patients develop a "wellness toolbox" with skills and strategies to promote recovery upon discharge. Dimensions of Wellness:   The focus of this group is to introduce the topic of wellness and discuss the role each dimension of wellness plays in total health. Healthy Communication:   The focus of this group is to discuss communication, barriers to communication, as well as healthy ways to communicate with others. Making Healthy Choices:   The focus of this group is to help patients identify negative/unhealthy choices they were using prior to admission and identify positive/healthier coping strategies to replace them upon discharge. Managing Feelings:   The focus of this group is to identify what feelings patients have difficulty handling and develop a plan to handle them in a healthier way upon discharge. Self Care:   The focus of this group is to help patients understand the importance of self-care in order to improve or restore emotional, physical, spiritual, interpersonal, and financial health.    Participation Level:  Did Not Attend  Participation Quality:   did not attend  Affect:   did not  attend  Cognitive:   did not attend  Insight: None  Engagement in Group:  None  Modes of Intervention:   did not attend  Additional Comments:    [name] 08/18/2022, 11:34 PM

## 2022-08-18 NOTE — B[facility] Counselor (Signed)
CSW contacted [name] [phone] extension [phone].   [name] stated she has not yet been able to find him somewhere to live. She stated she has been working on it.   [name] stated that she would call CSW if she had any progress   [name], MSW, [facility] 08/18/2022 1:08 PM

## 2022-08-19 MED ORDER — RISPERIDONE MICROSPHERES ER 25 MG IM SRER
25.0000 mg | INTRAMUSCULAR | Status: DC
  Administered 2022-08-20: 25 mg via INTRAMUSCULAR
  Filled 2022-08-19: qty 2

## 2022-08-19 MED ORDER — RISPERIDONE 1 MG PO TABS
3.0000 mg | ORAL_TABLET | Freq: Every day | ORAL | Status: DC
  Administered 2022-08-20 – 2022-08-21 (×2): 3 mg via ORAL
  Filled 2022-08-19 (×2): qty 3

## 2022-08-19 NOTE — Plan of Care (Signed)

## 2022-08-19 NOTE — Group Note (Signed)
Date:  08/19/2022 Time:  10:16 PM  Group Topic/Focus:  Building Self Esteem:   The Focus of this group is helping patients become aware of the effects of self-esteem on their lives, the things they and others do that enhance or undermine their self-esteem, seeing the relationship between their level of self-esteem and the choices they make and learning ways to enhance self-esteem. Coping With Mental Health Crisis:   The purpose of this group is to help patients identify strategies for coping with mental health crisis.  Group discusses possible causes of crisis and ways to manage them effectively. Conflict Resolution:   The focus of this group is to discuss the conflict resolution process and how it may be used upon discharge. Crisis Planning:   The purpose of this group is to help patients create a crisis plan for use upon discharge or in the future, as needed. Developing a Wellness Toolbox:   The focus of this group is to help patients develop a "wellness toolbox" with skills and strategies to promote recovery upon discharge. Making Healthy Choices:   The focus of this group is to help patients identify negative/unhealthy choices they were using prior to admission and identify positive/healthier coping strategies to replace them upon discharge. Self Care:   The focus of this group is to help patients understand the importance of self-care in order to improve or restore emotional, physical, spiritual, interpersonal, and financial health.    Participation Level:  Did Not Attend  Participation Quality:   Did Not Attend  Affect:   Did Not Attend  Cognitive:   Did Not Attend  Insight: None  Engagement in Group:  None  Modes of Intervention:   Did Not Attend  Additional Comments:    [name] 08/19/2022, 10:16 PM

## 2022-08-19 NOTE — Progress Notes (Signed)
Patient in dayroom upon approach.  Patient is pleasant and smiling.  Reports he slept well.  Good appetite.  Denies SI/HI.  Denies pain.  Compliant with all PO scheduled medications.  15 min checks in place for safety.  Patient is present in the milieu.  Appears to be responding to internal stimuli.  No behavioral issues at this time.    Clothes given to patient by social work.
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
## 2022-08-19 NOTE — Group Note (Signed)
Recreation Therapy Group Note   Group Topic:Goal Setting  Group Date: 08/19/2022 Start Time: 1400 End Time: 1455 Facilitators: Rosina Lowenstein, LRT, CTRS Location:  Day Room  Group Description: Vision Board. Patients were given many different magazines, a glue stick, markers, and a piece of cardstock paper. LRT and pts discussed the importance of having goals in life. LRT and pts discussed the difference between short-term and long-term goals, as well as what a SMART goal is. LRT encouraged pts to create a vision board, with images they picked and then cut out with safety scissors from the magazine, for themselves, that capture their short and long-term goals. LRT encouraged pts to show and explain their vision board to the group. LRT offered to laminate vision board once dry and complete.   Goal Area(s) Addressed:  Patient will gain knowledge of short vs. long term goals.  Patient will identify goals for themselves. Patient will practice setting SMART goals. Patient will verbalize their goals to LRT and peers.   Affect/Mood: Appropriate and Full range   Participation Level: Active, Engaged, and Hyperverbal   Participation Quality: Independent   Behavior: Alert and Cooperative   Speech/Thought Process: Flight of ideas and Loose association   Insight: Limited   Judgement: Fair    Modes of Intervention: Art   Patient Response to Interventions:  Attentive, Engaged, and Receptive   Education Outcome:  Acknowledges education   Clinical Observations/Individualized Feedback: [name] was active in their participation of session activities and group discussion. Pt was very talkative in group. Talking about different things such as doing a back flip, to [employer], to music. Pt cut out two images and put them on his vision board and said that it was a picture of him. Pt was very pleasant and had a bright affect when interacting. Pt interacted well with LRT and peers duration of  session.   Plan: Continue to engage patient in RT group sessions 2-3x/week.   Rosina Lowenstein, LRT, CTRS 08/19/2022 3:39 PM

## 2022-08-19 NOTE — Group Note (Signed)
LCSW Group Therapy Note   Group Date: 08/19/2022 Start Time: 1315 End Time: 1400   Type of Therapy and Topic:  Group Therapy: Challenging Core Beliefs  Participation Level:  Active  Description of Group:  Patients were educated about core beliefs and asked to identify one harmful core belief that they have. Patients were asked to explore from where those beliefs originate. Patients were asked to discuss how those beliefs make them feel and the resulting behaviors of those beliefs. They were then be asked if those beliefs are true and, if so, what evidence they have to support them. Lastly, group members were challenged to replace those negative core beliefs with helpful beliefs.   Therapeutic Goals:   1. Patient will identify harmful core beliefs and explore the origins of such beliefs. 2. Patient will identify feelings and behaviors that result from those core beliefs. 3. Patient will discuss whether such beliefs are true. 4.  Patient will replace harmful core beliefs with helpful ones.  Summary of Patient Progress:  [name] actively engaged in processing and exploring how core beliefs are formed and how they impact thoughts, feelings, and behaviors. Patient proved open to input from peers and feedback from CSW. Patient demonstrated fair insight into the subject matter, was respectful and supportive of peers, and participated throughout the entire session.  Therapeutic Modalities: Cognitive Behavioral Therapy; Solution-Focused Therapy   [name], [place] 08/19/2022  2:48 PM

## 2022-08-19 NOTE — Plan of Care (Signed)
Assumed care of patient, last evening, he was noted talking to himself, incoherently, mood labile, irritable and agitated at times. Pt was compliant with taking his scheduled medications but, declined IM medicines for agitation. Patient denied having thoughts, plan or intent to harm self or others. Will continue to monitor and assess for safety.   Problem: Education: Goal: Knowledge of General Education information will improve Description: Including pain rating scale, medication(s)/side effects and non-pharmacologic comfort measures Outcome: Not Progressing   Problem: Clinical Measurements: Goal: Ability to maintain clinical measurements within normal limits will improve Outcome: Progressing Goal: Will remain free from infection Outcome: Progressing Goal: Diagnostic test results will improve Outcome: Progressing Goal: Respiratory complications will improve Outcome: Not Applicable Goal: Cardiovascular complication will be avoided Outcome: Progressing   Problem: Clinical Measurements: Goal: Ability to maintain clinical measurements within normal limits will improve Outcome: Progressing Goal: Will remain free from infection Outcome: Progressing Goal: Diagnostic test results will improve Outcome: Progressing Goal: Respiratory complications will improve Outcome: Not Applicable Goal: Cardiovascular complication will be avoided Outcome: Progressing   Problem: Nutrition: Goal: Adequate nutrition will be maintained Outcome: Progressing   Problem: Activity: Goal: Risk for activity intolerance will decrease Outcome: Progressing   Problem: Coping: Goal: Level of anxiety will decrease Outcome: Not Progressing

## 2022-08-20 DIAGNOSIS — F250 Schizoaffective disorder, bipolar type: Secondary | ICD-10-CM | POA: Diagnosis not present

## 2022-08-20 NOTE — Group Note (Signed)
Date:  08/20/2022 Time:  1:13 PM  Group Topic/Focus:  Coping With Mental Health Crisis:   The purpose of this group is to help patients identify strategies for coping with mental health crisis.  Group discusses possible causes of crisis and ways to manage them effectively. Healthy Communication:   The focus of this group is to discuss communication, barriers to communication, as well as healthy ways to communicate with others. Overcoming Stress:   The focus of this group is to define stress and help patients assess their triggers.  We went outside to get some fresh air and we talked about healthy ways to cope with stress and mental health issues.     Participation Level:  Active  Participation Quality:  Appropriate  Affect:  Appropriate  Cognitive:  Appropriate  Insight: Appropriate  Engagement in Group:  Engaged  Modes of Intervention:  Discussion  Additional Comments:    [name] L Deunte Bledsoe 08/20/2022, 1:13 PM
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
## 2022-08-20 NOTE — Progress Notes (Signed)
   08/20/22 0735  Psych Admission Type (Psych Patients Only)  Admission Status Involuntary  Psychosocial Assessment  Patient Complaints None  Eye Contact Intense  Facial Expression Anxious  Affect Anxious;Labile  Speech Pressured;Slurred  Interaction Assertive;Needy  Motor Activity Restless  Appearance/Hygiene Disheveled  Behavior Characteristics Intrusive;Impulsive  Mood Pleasant  Thought Process  Coherency Tangential;Flight of ideas  Content Magical thinking;Preoccupation;Delusions  Delusions Grandeur  Perception Hallucinations  Hallucination Auditory;Visual  Judgment Impaired  Confusion Mild  Danger to Self  Current suicidal ideation? Denies  Danger to Others  Danger to Others None reported or observed

## 2022-08-20 NOTE — BHH Counselor (Signed)
CSW received a call from Cari Caraway, the administrator of a group home.  He reports that [name] requested that he come to interview patient for a bed.    CSW explained that this has to be arranged prior to arrival and additional conversations will need to be had with other parties before interview could occur.    He reported that if interview does not happen today "then we may not be able to offer him a bed".  CSW provided sympathy but reiterated that meeting could not occur at this time.   Follow up is needed with the patient's guardian and physician on pt's ability to participate in an interview.    CSW notes that this writer was able to get [name] to agree to a possible telehealth interview.    Cari Caraway, [facility], [phone]   [name], MSW, LCSW 08/20/2022 3:58 PM

## 2022-08-20 NOTE — Group Note (Signed)
Recreation Therapy Group Note   Group Topic:Self-Esteem  Group Date: 08/20/2022 Start Time: 1400 End Time: 1500 Facilitators: Rosina Lowenstein, LRT, CTRS Location:  Day room  Group Description: Positive Focus. Patients are given a handout that has 9 different boxes on it. Each box has a different prompt on it that requires you to identify and add something positive about themselves. LRT encourages pts to share at least two of their boxes to the group. LRT and pts discuss the importance of "thinking positive", self-esteem and how they can apply it to their everyday life post-discharge. After completing worksheet, patients played Positive Affirmation Bingo and won stress balls as prizes.   Goal Area(s) Addressed: Patient will identify positive qualities about themselves. Patient will identify definition of "self-esteem". Patients will identify the difference between positive and negative self-esteem.  Patient will recite positive qualities and affirmations aloud to the group.   Affect/Mood: Appropriate   Participation Level: Active   Participation Quality: Minimal Cues   Behavior: Calm and Cooperative   Speech/Thought Process: Flight of ideas and Loose association   Insight: Limited   Judgement: Fair    Modes of Intervention: Activity and Worksheet   Patient Response to Interventions:  Receptive   Education Outcome:  In group clarification offered    Clinical Observations/Individualized Feedback: [name] was mostly active in their participation of session activities and group discussion. Pt colored in the first two boxes with a lot of random words that did not make sense. Pt seemed to be falling asleep while in group, however shared that he was not whenever LRT asked if e was tired to wanted to go lay down. Pt was pleasant when interacting with LRT and peers.    Plan: Continue to engage patient in RT group sessions 2-3x/week.   Rosina Lowenstein, LRT, CTRS 08/20/2022 3:17 PM

## 2022-08-20 NOTE — Plan of Care (Signed)

## 2022-08-20 NOTE — Group Note (Signed)
Date:  08/20/2022 Time:  8:41 PM  Group Topic/Focus:  Emotional Education:   The focus of this group is to discuss what feelings/emotions are, and how they are experienced.    Participation Level:  Active  Participation Quality:  Appropriate  Affect:  Appropriate  Cognitive:  Appropriate  Insight: Appropriate  Engagement in Group:  Engaged  Modes of Intervention:  Education  Additional Comments:    Jeannette How 08/20/2022, 8:41 PM

## 2022-08-21 DIAGNOSIS — F250 Schizoaffective disorder, bipolar type: Secondary | ICD-10-CM | POA: Diagnosis not present

## 2022-08-21 NOTE — Group Note (Signed)
[facility] LCSW Group Therapy Note   Group Date: 08/21/2022 Start Time: 1315 End Time: 1345   Type of Therapy/Topic:  Group Therapy:  Balance in Life  Participation Level:  Active   Description of Group:    This group will address the concept of balance and how it feels and looks when one is unbalanced. Patients will be encouraged to process areas in their lives that are out of balance, and identify reasons for remaining unbalanced. Facilitators will guide patients utilizing problem- solving interventions to address and correct the stressor making their life unbalanced. Understanding and applying boundaries will be explored and addressed for obtaining  and maintaining a balanced life. Patients will be encouraged to explore ways to assertively make their unbalanced needs known to significant others in their lives, using other group members and facilitator for support and feedback.  Therapeutic Goals: Patient will identify two or more emotions or situations they have that consume much of in their lives. Patient will identify signs/triggers that life has become out of balance:  Patient will identify two ways to set boundaries in order to achieve balance in their lives:  Patient will demonstrate ability to communicate their needs through discussion and/or role plays  Summary of Patient Progress:    Pt was on time to group and had fair insight about subject. Pt frequently interjected with questions that were off-topic but was easy to redirect    Therapeutic Modalities:   Cognitive Behavioral Therapy Solution-Focused Therapy Assertiveness Training   [name], LCSWA

## 2022-08-21 NOTE — Progress Notes (Signed)
   08/21/22 0800  Psych Admission Type (Psych Patients Only)  Admission Status Involuntary  Psychosocial Assessment  Patient Complaints Depression  Eye Contact Intense  Facial Expression Anxious  Affect Anxious;Labile  Speech Pressured;Slurred  Interaction Assertive;Needy  Motor Activity Restless  Appearance/Hygiene Disheveled  Behavior Characteristics Impulsive;Irritable  Mood Irritable  Thought Process  Coherency Tangential;Flight of ideas  Content Magical thinking;Preoccupation;Delusions  Delusions Grandeur  Perception Hallucinations  Hallucination Auditory;Visual  Judgment Impaired  Confusion Mild  Danger to Self  Current suicidal ideation? Denies  Danger to Others  Danger to Others None reported or observed

## 2022-08-21 NOTE — Group Note (Signed)
Date:  08/21/2022 Time:  11:51 AM  Group Topic/Focus:  The cycle of anxiety  Patients identify triggers and traits that cause anxiety   Participation Level:  Active  Participation Quality:  Appropriate  Affect:  Appropriate  Cognitive:  Appropriate  Insight: Appropriate and Improving  Engagement in Group:  Improving  Modes of Intervention:  Activity and Confrontation  Additional Comments:    Marta Antu 08/21/2022, 11:51 AM

## 2022-08-21 NOTE — Group Note (Signed)
Recreation Therapy Group Note   Group Topic:Healthy Support Systems  Group Date: 08/21/2022 Start Time: 1410 End Time: 1500 Facilitators: Rosina Lowenstein, LRT, CTRS Location:  Day room  Group Description: Straw Bridge.  Patients were given 10 plastic drinking straws and an equal length of masking tape. Using the materials provided, patients were instructed to build a free-standing bridge-like structure to suspend an everyday item (ex: deck of cards) off the floor or table surface. All materials were required to be used in Secondary school teacher. LRT facilitated post-activity discussion reviewing the importance of having strong and healthy support systems in our lives. LRT discussed how the people in our lives serve as the tape and the deck of cards we placed on top of our straw structure are the stressors we face in daily life. LRT and pts discussed what happens in our life when things get too heavy for [place], and we don't have strong supports outside of the hospital. Pt shared 2 of their healthy supports aloud in the group.    Goal Area(s) Addressed:  Patient will identify 2 healthy supports in their life. Patient will identify skills to successfully complete activity. Patient will identify correlation of this activity to life post-discharge.  Patient will work on Product manager.   Affect/Mood: Appropriate   Participation Level: Active and Engaged   Participation Quality: Independent   Behavior: Appropriate, Calm, and Cooperative   Speech/Thought Process: Loose association    Insight: Limited   Judgement: Fair    Modes of Intervention: Activity   Patient Response to Interventions:  Attentive, Engaged, Interested , and Receptive   Education Outcome:  Acknowledges education   Clinical Observations/Individualized Feedback: [place]inta was active in their participation of session activities and group discussion. Pt identified "sister and mommy" as his healthy supports. Pt was the  only one in the group that was able to successfully completed the activity that held that cards up. Pt became very focused on the task at hand and seemed very proud of himself once it was complete. Pt interacted well with LRT and peers duration of session.   Plan: Continue to engage patient in RT group sessions 2-3x/week.   Rosina Lowenstein, LRT, CTRS 08/21/2022 3:44 PM

## 2022-08-21 NOTE — BH IP Treatment Plan (Signed)
Interdisciplinary Treatment and Diagnostic Plan Update  08/21/2022 Time of Session: 9:00 AM  [name] MRN: [id number]  Principal Diagnosis: Schizoaffective disorder, bipolar type (HCC)  Secondary Diagnoses: Principal Problem:   Schizoaffective disorder, bipolar type (HCC)   Current Medications:  Current Facility-Administered Medications  Medication Dose Route Frequency Provider Last Rate Last Admin   amantadine (SYMMETREL) capsule 100 mg  100 mg Oral BID Ardis Hughs, NP   100 mg at 08/21/22 0945   amLODipine (NORVASC) tablet 10 mg  10 mg Oral Daily Sarina Ill, DO   10 mg at 08/21/22 0946   atorvastatin (LIPITOR) tablet 40 mg  40 mg Oral QHS Ardis Hughs, NP   40 mg at 08/20/22 2107   chlorproMAZINE (THORAZINE) injection 25 mg  25 mg Intramuscular TID PRN Lewanda Rife, MD   25 mg at 08/15/22 2314   And   LORazepam (ATIVAN) injection 1 mg  1 mg Intramuscular Q8H PRN Lewanda Rife, MD       cloNIDine (CATAPRES) tablet 0.1 mg  0.1 mg Oral Q8H PRN Sarina Ill, DO       diphenhydrAMINE (BENADRYL) capsule 25 mg  25 mg Oral Q6H PRN Ardis Hughs, NP   25 mg at 08/17/22 2032   divalproex (DEPAKOTE) DR tablet 500 mg  500 mg Oral Q12H Lewanda Rife, MD   500 mg at 08/21/22 0945   fluticasone furoate-vilanterol (BREO ELLIPTA) 200-25 MCG/ACT 1 puff  1 puff Inhalation Daily Ardis Hughs, NP   1 puff at 08/21/22 0945   losartan (COZAAR) tablet 100 mg  100 mg Oral Daily Ardis Hughs, NP   100 mg at 08/21/22 0946   nicotine (NICODERM CQ - dosed in mg/24 hours) patch 21 mg  21 mg Transdermal Daily Vernard Gambles H, NP       risperiDONE (RISPERDAL) tablet 3 mg  3 mg Oral QHS Lewanda Rife, MD   3 mg at 08/20/22 2107   risperiDONE microspheres (RISPERDAL CONSTA) injection 25 mg  25 mg Intramuscular Q14 Days Lewanda Rife, MD   25 mg at 08/20/22 1456   traZODone (DESYREL) tablet 100 mg  100 mg Oral QHS PRN Ardis Hughs, NP   100 mg at 08/20/22 2107   PTA Medications: Medications Prior to Admission  Medication Sig Dispense Refill Last Dose   amantadine (SYMMETREL) 100 MG capsule Take 1 capsule (100 mg total) by mouth 2 (two) times daily. 60 capsule 3    amLODipine (NORVASC) 5 MG tablet Take 1 tablet (5 mg total) by mouth daily. 30 tablet 1    atorvastatin (LIPITOR) 40 MG tablet Take 1 tablet (40 mg total) by mouth at bedtime. 30 tablet 1    carbamazepine (TEGRETOL XR) 400 MG 12 hr tablet Take 1 tablet (400 mg total) by mouth at bedtime. 30 tablet 3    divalproex (DEPAKOTE) 250 MG DR tablet Take 250 mg by mouth 3 (three) times daily.      fluticasone furoate-vilanterol (BREO ELLIPTA) 200-25 MCG/ACT AEPB Inhale 1 puff into the lungs daily. 1 each 1    losartan (COZAAR) 100 MG tablet Take 1 tablet (100 mg total) by mouth daily. 30 tablet 3    Melatonin 10 MG TABS Take 10 mg by mouth at bedtime.      QUEtiapine (SEROQUEL) 25 MG tablet Take 25 mg by mouth at bedtime.      risperiDONE (RISPERDAL) 3 MG tablet Take 1 tablet (3 mg total) by mouth 2 (two)  times daily at 8 am and 4 pm. 60 tablet 3    traZODone (DESYREL) 100 MG tablet Take 1 tablet (100 mg total) by mouth at bedtime as needed for sleep. (Patient not taking: Reported on 08/06/2022) 30 tablet 3     Patient Stressors: Health problems   Medication change or noncompliance    Patient Strengths: Active sense of humor  Motivation for treatment/growth   Treatment Modalities: Medication Management, Group therapy, Case management,  1 to 1 session with clinician, Psychoeducation, Recreational therapy.   Physician Treatment Plan for Primary Diagnosis: Schizoaffective disorder, bipolar type (HCC) Long Term Goal(s): Improvement in symptoms so as ready for discharge   Short Term Goals: Ability to identify changes in lifestyle to reduce recurrence of condition will improve Ability to verbalize feelings will improve Ability to disclose and discuss suicidal  ideas Ability to demonstrate self-control will improve Ability to identify and develop effective coping behaviors will improve Ability to maintain clinical measurements within normal limits will improve Compliance with prescribed medications will improve Ability to identify triggers associated with substance abuse/mental health issues will improve  Medication Management: Evaluate patient's response, side effects, and tolerance of medication regimen.  Therapeutic Interventions: 1 to 1 sessions, Unit Group sessions and Medication administration.  Evaluation of Outcomes: Progressing  Physician Treatment Plan for Secondary Diagnosis: Principal Problem:   Schizoaffective disorder, bipolar type (HCC)  Long Term Goal(s): Improvement in symptoms so as ready for discharge   Short Term Goals: Ability to identify changes in lifestyle to reduce recurrence of condition will improve Ability to verbalize feelings will improve Ability to disclose and discuss suicidal ideas Ability to demonstrate self-control will improve Ability to identify and develop effective coping behaviors will improve Ability to maintain clinical measurements within normal limits will improve Compliance with prescribed medications will improve Ability to identify triggers associated with substance abuse/mental health issues will improve     Medication Management: Evaluate patient's response, side effects, and tolerance of medication regimen.  Therapeutic Interventions: 1 to 1 sessions, Unit Group sessions and Medication administration.  Evaluation of Outcomes: Progressing   RN Treatment Plan for Primary Diagnosis: Schizoaffective disorder, bipolar type (HCC) Long Term Goal(s): Knowledge of disease and therapeutic regimen to maintain health will improve  Short Term Goals: Ability to remain free from injury will improve, Ability to verbalize frustration and anger appropriately will improve, Ability to demonstrate  self-control, Ability to participate in decision making will improve, Ability to verbalize feelings will improve, Ability to disclose and discuss suicidal ideas, Ability to identify and develop effective coping behaviors will improve, and Compliance with prescribed medications will improve  Medication Management: RN will administer medications as ordered by provider, will assess and evaluate patient's response and provide education to patient for prescribed medication. RN will report any adverse and/or side effects to prescribing provider.  Therapeutic Interventions: 1 on 1 counseling sessions, Psychoeducation, Medication administration, Evaluate responses to treatment, Monitor vital signs and CBGs as ordered, Perform/monitor CIWA, COWS, AIMS and Fall Risk screenings as ordered, Perform wound care treatments as ordered.  Evaluation of Outcomes: Progressing   LCSW Treatment Plan for Primary Diagnosis: Schizoaffective disorder, bipolar type (HCC) Long Term Goal(s): Safe transition to appropriate next level of care at discharge, Engage patient in therapeutic group addressing interpersonal concerns.  Short Term Goals: Engage patient in aftercare planning with referrals and resources, Increase social support, Increase ability to appropriately verbalize feelings, Increase emotional regulation, Facilitate acceptance of mental health diagnosis and concerns, and Increase skills for wellness and  recovery  Therapeutic Interventions: Assess for all discharge needs, 1 to 1 time with Child psychotherapist, Explore available resources and support systems, Assess for adequacy in community support network, Educate family and significant other(s) on suicide prevention, Complete Psychosocial Assessment, Interpersonal group therapy.  Evaluation of Outcomes: Progressing   Progress in Treatment: Attending groups: Yes. Participating in groups: Yes. Taking medication as prescribed: Yes. Toleration medication:  Yes. Family/Significant other contact made: No, will contact:  CSW  will contact if given permission  Patient understands diagnosis: Yes. Discussing patient identified problems/goals with staff: Yes. Medical problems stabilized or resolved: Yes. Denies suicidal/homicidal ideation: Yes. Issues/concerns per patient self-inventory: No. Other: None   New problem(s) identified: No, Describe:  None identified  Update 08/21/22: No changes at this time    New Short Term/Long Term Goal(s): elimination of symptoms of psychosis, medication management for mood stabilization; elimination of SI thoughts; development of comprehensive mental wellness plan. Update 08/21/22: No changes at this time    Patient Goals:  Pt unable to participate in treatment team due to active psychosis. Treatment team documents were instead sent over to legal guardian at [employer] Update 08/21/22: No changes at this time    Discharge Plan or Barriers:  CSW to assist with appropriate discharge planning Update 08/21/22: No changes at this time    Reason for Continuation of Hospitalization: Delusions  Medication stabilization   Estimated Length of Stay: 1 to 7 daysUpdate 08/21/22: No changes at this time   Last 3 [place] Suicide Severity Risk Score: Flowsheet Row Admission (Current) from 08/10/2022 in [facility] BE[facility]AVIORAL MEDICINE ED from 08/06/2022 in [facility] Admission (Discharged) from 05/06/2022 in [facility] BE[facility]AV[facility]  C-SSRS RISK CATEGORY No Risk No Risk No Risk       Last PHQ 2/9 Scores:     No data to display          Scribe for Treatment Team: [name], Theresia Majors 08/21/2022 11:32 AM

## 2022-08-21 NOTE — Progress Notes (Signed)
Patient was cooperative with treatment and medication on shift, speech remains tangential with loose associations but he is mostly redirectable. Patient is currently in bed resting at this time.

## 2022-08-21 NOTE — Plan of Care (Signed)
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
## 2022-08-22 DIAGNOSIS — F250 Schizoaffective disorder, bipolar type: Secondary | ICD-10-CM | POA: Diagnosis not present

## 2022-08-22 MED ORDER — CHLORPROMAZINE HCL 50 MG PO TABS
25.0000 mg | ORAL_TABLET | Freq: Three times a day (TID) | ORAL | Status: DC | PRN
  Administered 2022-08-22 – 2022-08-30 (×7): 25 mg via ORAL
  Filled 2022-08-22 (×8): qty 1

## 2022-08-22 MED ORDER — CHLORPROMAZINE HCL 10 MG PO TABS
10.0000 mg | ORAL_TABLET | Freq: Once | ORAL | Status: AC
  Administered 2022-08-22: 10 mg via ORAL
  Filled 2022-08-22: qty 1

## 2022-08-22 MED ORDER — RISPERIDONE 1 MG PO TABS
3.0000 mg | ORAL_TABLET | Freq: Two times a day (BID) | ORAL | Status: DC
  Administered 2022-08-23 – 2022-09-03 (×21): 3 mg via ORAL
  Filled 2022-08-22 (×24): qty 3

## 2022-08-22 NOTE — Group Note (Signed)
Date:  08/22/2022 Time:  10:50 AM  Group Topic/Focus:  Goals Group:   The focus of this group is to help patients establish daily goals to achieve during treatment and discuss how the patient can incorporate goal setting into their daily lives to aide in recovery.    Participation Level:  Active  Participation Quality:  Appropriate  Affect:  Appropriate  Cognitive:  Appropriate  Insight: Appropriate  Engagement in Group:  Engaged  Modes of Intervention:  Discussion  Additional Comments:  None  [name] 08/22/2022, 10:50 AM

## 2022-08-22 NOTE — Group Note (Signed)
Recreation Therapy Group Note   Group Topic:Leisure Education  Group Date: 08/22/2022 Start Time: 1340 End Time: 1415 Facilitators: Rosina Lowenstein, LRT, CTRS Location: Courtyard   Group Description: Leisure. Patients were given the opportunity to play ring toss, play cards, or listen to music while sitting in the courtyard getting fresh air and sunlight. LRT and pts discussed the meaning of leisure, the importance of participating in leisure during their free time/when they're outside of the hospital, as well as how our leisure interests can also serve as coping skills. Pt identified two leisure interests and shared with the group.   Goal Area(s) Addressed:  Patient will identify a current leisure interest.  Patient will learn the definition of "leisure". Patient will practice making a positive decision. Patient will have the opportunity to try a new leisure activity. Patient will communicate with peers and LRT.    Affect/Mood: Appropriate   Participation Level: Active and Engaged   Participation Quality: Independent   Behavior: Appropriate, Calm, and Cooperative   Speech/Thought Process: Loose association   Insight: Limited   Judgement: Fair    Modes of Intervention: Activity   Patient Response to Interventions:  Receptive   Education Outcome:  In group clarification offered    Clinical Observations/Individualized Feedback: [name] was active in their participation of session activities and group discussion. Pt identified "[name]. [name]. I am [name]. I want to focus on being better." Pt was pleasant when interacting with LRT and peers during group.    Plan: Continue to engage patient in RT group sessions 2-3x/week.   Rosina Lowenstein, LRT, CTRS 08/22/2022 2:45 PM

## 2022-08-22 NOTE — Progress Notes (Addendum)
Patient admitted IVC August 10, 2022 for medication non adherence at his group home and reports of aggressive behavior. He denies SI/HI/AVH but is clearly responding to internal stimuli. Patient has experienced intermittent periods of irritability and agitation. MD made aware. One-time order Thorazine 10 mg PO adm with 25 mg Benadryl PO at 1138. Upon follow up, meds appear to have provided relief.  Similar behavior noted plus yelling noted coming from his room. Patient says that he was frustrated because he wants to leave. Engaged in active listening. Thorazine 25 mg adm PRN PO at 1830.   08/22/22 1300  Psych Admission Type (Psych Patients Only)  Admission Status Involuntary  Psychosocial Assessment  Patient Complaints Anxiety  Eye Contact Glaring  Facial Expression Anxious  Affect Labile  Speech Slurred  Interaction Assertive;Needy  Motor Activity Restless  Appearance/Hygiene In scrubs  Behavior Characteristics Irritable  Mood Irritable  Thought Process  Coherency Flight of ideas  Content Magical thinking;Preoccupation  Delusions Grandeur;Paranoid  Perception Hallucinations  Hallucination Auditory;Visual  Judgment Impaired  Confusion Mild  Danger to Self  Current suicidal ideation? Denies  Agreement Not to Harm Self Yes  Description of Agreement verbal  Danger to Others  Danger to Others None reported or observed
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
## 2022-08-22 NOTE — Plan of Care (Signed)
  Problem: Pain Managment: Goal: General experience of comfort will improve Outcome: Progressing   Problem: Safety: Goal: Ability to remain free from injury will improve Outcome: Progressing   Problem: Skin Integrity: Goal: Risk for impaired skin integrity will decrease Outcome: Progressing   

## 2022-08-22 NOTE — Progress Notes (Signed)
Pt agitated pacing in and out of his room with loud outburst attempted to redirect Patient several times with no effect. Prn Benadryl given at HS medication not very effective. Q 15 minutes safety checks ongoing. Patient awake most of the night.

## 2022-08-22 NOTE — Plan of Care (Signed)
  Problem: Nutrition: Goal: Adequate nutrition will be maintained Outcome: Progressing   Problem: Coping: Goal: Level of anxiety will decrease Outcome: Progressing   

## 2022-08-22 NOTE — Group Note (Signed)
Date:  08/22/2022 Time:  9:49 PM  Group Topic/Focus:  Personal Choices and Values:   The focus of this group is to help patients assess and explore the importance of values in their lives, how their values affect their decisions, how they express their values and what opposes their expression.    Participation Level:  Did Not Attend  Participation Quality:   Did Not Attend  Affect:   Did Not Attend  Cognitive:   Did Not Attend  Insight: None  Engagement in Group:  None  Modes of Intervention:  Orientation  Additional Comments:    [name] 08/22/2022, 9:49 PM

## 2022-08-22 NOTE — Group Note (Signed)
Date:  08/22/2022 Time:  3:47 AM  Group Topic/Focus:  Healthy Communication:   The focus of this group is to discuss communication, barriers to communication, as well as healthy ways to communicate with others.    Participation Level:  Minimal  Participation Quality:  Redirectable  Affect:  Appropriate  Cognitive:  Alert  Insight: Lacking  Engagement in Group:  Distracting and Engaged  Modes of Intervention:  Support  Additional Comments:    [name] 08/22/2022, 3:47 AM

## 2022-08-23 DIAGNOSIS — F250 Schizoaffective disorder, bipolar type: Secondary | ICD-10-CM | POA: Diagnosis not present
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
## 2022-08-23 NOTE — Plan of Care (Signed)
  Problem: Education: Goal: Knowledge of General Education information will improve Description: Including pain rating scale, medication(s)/side effects and non-pharmacologic comfort measures Outcome: Not Progressing   Problem: Health Behavior/Discharge Planning: Goal: Ability to manage health-related needs will improve Outcome: Not Progressing   Problem: Clinical Measurements: Goal: Ability to maintain clinical measurements within normal limits will improve Outcome: Not Progressing Goal: Will remain free from infection Outcome: Not Progressing Goal: Diagnostic test results will improve Outcome: Not Progressing Goal: Cardiovascular complication will be avoided Outcome: Not Progressing   Problem: Activity: Goal: Risk for activity intolerance will decrease Outcome: Not Progressing   Problem: Nutrition: Goal: Adequate nutrition will be maintained Outcome: Not Progressing   Problem: Coping: Goal: Level of anxiety will decrease Outcome: Not Progressing   Problem: Elimination: Goal: Will not experience complications related to bowel motility Outcome: Not Progressing Goal: Will not experience complications related to urinary retention Outcome: Not Progressing   Problem: Pain Managment: Goal: General experience of comfort will improve Outcome: Not Progressing   Problem: Safety: Goal: Ability to remain free from injury will improve Outcome: Not Progressing   Problem: Skin Integrity: Goal: Risk for impaired skin integrity will decrease Outcome: Not Progressing   

## 2022-08-23 NOTE — Progress Notes (Signed)
Pt.visible in common areas. Gait steady, breathing appears unlabored, denies somatic complaints. Alert and oriented x2.  Appears internally stimulated. Minimal engagement with peers noted.  Denies self-harm ideation. Mood is labile but patient responds to verbal intervention and is able to self-soothe. To bed within normal limits.

## 2022-08-23 NOTE — Plan of Care (Signed)
  Problem: Activity: Goal: Risk for activity intolerance will decrease Outcome: Progressing   Problem: Nutrition: Goal: Adequate nutrition will be maintained Outcome: Progressing   Problem: Pain Managment: Goal: General experience of comfort will improve Outcome: Progressing   

## 2022-08-23 NOTE — Progress Notes (Signed)
Patient was cooperative and redirectable at beginning of the shift, but he got upset and delusional about another patient talking about him and got to yelling loudly and slamming, he received two IM's with some effect. He remains labile and irritable.

## 2022-08-23 NOTE — Group Note (Signed)
Date:  08/23/2022 Time:  10:39 AM  Group Topic/Focus:  OUTDOOR RECREATION  AND RAPPORT BUILDING    Participation Level:  Active  Participation Quality:  Appropriate and Attentive  Affect:  Appropriate  Cognitive:  Alert and Appropriate  Insight: Appropriate  Engagement in Group:  Engaged and Supportive  Modes of Intervention:  Activity, Discussion, and Socialization  Additional Comments:    Rosaura Carpenter 08/23/2022, 10:39 AM

## 2022-08-23 NOTE — Progress Notes (Signed)
Patient admitted IVC August 10, 2022 for medication non adherence at his group home and reports of aggressive behavior. He denies SI/HI/AVH but does appear to be responding to internal stimuli. Patient's behavior was more appropriate today than yesterday's shift. Although patient presented with irritability and confusion, he was able to self calm during various periods of frustration. No PRN's adm during this shift.  Patient showered, did not exchange clothing worn, laundry completed, and bed linen changed.  Q15 minute unit checks in place.

## 2022-08-24 DIAGNOSIS — F250 Schizoaffective disorder, bipolar type: Secondary | ICD-10-CM | POA: Diagnosis not present

## 2022-08-24 LAB — VALPROIC ACID LEVEL: Valproic Acid Lvl: 54 ug/mL (ref 50.0–100.0)

## 2022-08-24 LAB — ALT: ALT: 16 U/L (ref 0–44)

## 2022-08-24 LAB — AST: AST: 21 U/L (ref 15–41)

## 2022-08-24 NOTE — Progress Notes (Signed)
Patient pacing in and out of his bedroom stated he wanted to sleep all PRN given Benadryl, Thorazine and Trazodone. Medications are not effective. Patient in room appears to be responding to internal stimuli. Can be heard shouting having outburst. Required multiple redirections. Encouragement and support provided. Q 15 minutes safety checks ongoing. Patient remains safe.

## 2022-08-24 NOTE — Plan of Care (Signed)
  Problem: Pain Managment: °Goal: General experience of comfort will improve °Outcome: Not Progressing °  °Problem: Safety: °Goal: Ability to remain free from injury will improve °Outcome: Progressing °  °

## 2022-08-24 NOTE — Plan of Care (Signed)
  Problem: Activity: Goal: Risk for activity intolerance will decrease Outcome: Progressing   Problem: Nutrition: Goal: Adequate nutrition will be maintained Outcome: Progressing   Problem: Coping: Goal: Level of anxiety will decrease Outcome: Not Progressing   

## 2022-08-24 NOTE — Group Note (Signed)
LCSW Group Therapy Note   Group Date: 08/24/2022 Start Time: 1350 End Time: 1450   Type of Therapy and Topic:  Group Therapy: [employer] Dimensions of Wellness  Participation Level:  Active    Summary of Patient Progress:  The patient attended group. Patient proved open to input from peers and feedback from [facility]. The patient was respectful of peers and participated throughout the entire session.      Marshell Levan, LCSWA 08/24/2022  3:21 PM

## 2022-08-24 NOTE — Progress Notes (Signed)
Patient admitted IVC on August 10, 2022 from [facility] ED for medication non adherence and reports of aggressive behavior at his group home. Diagnosis of Schizoaffective disorder.  He denies SI/HI/AVH. He denies depression and anxiety despite having anxious mood and anxious behavior characteristics. Patient is fixated on discharging to a facility in [place]. Active listening and support provided.Patient continues to respond to internal stimuli and is often seen turning to the side pointing and talking in that direction. Patient refused amantadine and losartan. "This is too many white pills. I am no woman. I don't need all of that." Attempted to educate patient about the dosage of losartan being 4 pills but only 1 medication. Patient continued to be resistant. Encouragement provided.   At approx 1615, patient became extremely irritable and began having loud tantrum like outbursts. "They are going to call 911 on me. My name isn't Mate. They don't know who I am." Offered benadryl 25 mg and thorazine 25 mg PO. Medication adm without any issues. Upon follow up, medication appeared semi-effective. Patient was able to self calm but is still responding to internal stimuli.  Q15 minute unit checks in place.

## 2022-08-24 NOTE — Progress Notes (Signed)
Patient did not sleep at all 0 hours all night long. He is disorganized, Loud outburst Tangential has been pacing all night long. Prn Thorazine and Benadryl medications not effective.
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
## 2022-08-25 DIAGNOSIS — F250 Schizoaffective disorder, bipolar type: Secondary | ICD-10-CM | POA: Diagnosis not present

## 2022-08-25 MED ORDER — DIPHENHYDRAMINE HCL 25 MG PO CAPS
50.0000 mg | ORAL_CAPSULE | Freq: Once | ORAL | Status: AC
  Administered 2022-08-25: 50 mg via ORAL
  Filled 2022-08-25: qty 2

## 2022-08-25 MED ORDER — CHLORPROMAZINE HCL 50 MG PO TABS
25.0000 mg | ORAL_TABLET | Freq: Two times a day (BID) | ORAL | Status: DC
  Administered 2022-08-25 – 2022-08-30 (×8): 25 mg via ORAL
  Filled 2022-08-25 (×9): qty 1

## 2022-08-25 MED ORDER — LORAZEPAM 1 MG PO TABS
2.0000 mg | ORAL_TABLET | Freq: Once | ORAL | Status: AC
  Administered 2022-08-25: 2 mg via ORAL
  Filled 2022-08-25: qty 2

## 2022-08-25 NOTE — BHH Counselor (Signed)
CSW sent referral email to [email or web address] per Maurine Minister' request.  [name], MSW, [facility] 08/25/2022 2:57 PM

## 2022-08-25 NOTE — Progress Notes (Addendum)
Refused EKG as ordered. Attempted but patient was too agitated and restless to keep still.  08/25/22 1100  Psych Admission Type (Psych Patients Only)  Admission Status Involuntary  Psychosocial Assessment  Patient Complaints Anxiety;Confusion;Hyperactivity;Irritability;Restlessness;Sleep disturbance;Worrying  Eye Contact Brief  Facial Expression Anxious;Worried  Affect Anxious;Irritable  Orthoptist Activity Hyperactive;Pacing;Restless  Appearance/Hygiene Unremarkable  Behavior Characteristics Anxious;Hyperactive;Irritable;Pacing;Restless  Mood Anxious;Irritable  Thought Process  Coherency Disorganized;Flight of ideas  Content Preoccupation  Delusions Paranoid  Perception Hallucinations  Hallucination Auditory;Visual  Judgment Impaired  Confusion Mild  Danger to Self  Current suicidal ideation? Denies  Agreement Not to Harm Self Yes  Danger to Others  Danger to Others None reported or observed   Patient refused most of his medications this morning. He only took his Depakote and Risperidone. Patient continues to pacing agitatedly, yelling out, slamming bedroom door. No boundary limits, tying T-shirt on his heads like a scarf, and tying his shirt in multiple knots on his body.  Refused group. Multiple verbal de-escalation to no avail. Received 25mg  Thorazine injection for agitation as ordered. Will continue to monitor. Patient appears anxious and restless at this time with no agitation noted.

## 2022-08-25 NOTE — Group Note (Signed)
Date:  08/25/2022 Time:  10:35 PM  Group Topic/Focus:  Wellness Toolbox:   The focus of this group is to discuss various aspects of wellness, balancing those aspects and exploring ways to increase the ability to experience wellness.  Patients will create a wellness toolbox for use upon discharge.    Participation Level:  Did Not Attend  Participation Quality:   None  Affect:   None  Cognitive:   did not attend  Insight: None  Engagement in Group:  None  Modes of Intervention:   did not attend  Additional Comments:    [name] 08/25/2022, 10:35 PM

## 2022-08-25 NOTE — BHH Group Notes (Signed)
Adult Psychoeducational Group Note  Date:  08/25/2022 Time:  12:20 PM  Group Topic/Focus:  Goals Group:   The focus of this group is to help patients establish daily goals to achieve during treatment and discuss how the patient can incorporate goal setting into their daily lives to aide in recovery.  Participation Level:  Active  Participation Quality:  Appropriate  Affect:  Appropriate  Cognitive:  Appropriate  Insight: Appropriate  Engagement in Group:  Engaged  Modes of Intervention:  Discussion  Additional Comments:  MHT held group int the courtyard. Pt attended group. Pt shared about getting a Zenaida Niece and moving, putting things in storage. Pt stated needing to find a place to stay. Pt left group after a few moments and returned inside    [facility] 08/25/2022, 12:20 PM

## 2022-08-25 NOTE — Plan of Care (Signed)
  Problem: Education: Goal: Knowledge of General Education information will improve Description: Including pain rating scale, medication(s)/side effects and non-pharmacologic comfort measures Outcome: Not Progressing   Problem: Health Behavior/Discharge Planning: Goal: Ability to manage health-related needs will improve Outcome: Not Progressing   Problem: Clinical Measurements: Goal: Ability to maintain clinical measurements within normal limits will improve Outcome: Not Progressing Goal: Will remain free from infection Outcome: Not Progressing Goal: Diagnostic test results will improve Outcome: Not Progressing Goal: Cardiovascular complication will be avoided Outcome: Not Progressing   Problem: Activity: Goal: Risk for activity intolerance will decrease Outcome: Not Progressing   Problem: Nutrition: Goal: Adequate nutrition will be maintained Outcome: Not Progressing   Problem: Coping: Goal: Level of anxiety will decrease Outcome: Not Progressing   Problem: Elimination: Goal: Will not experience complications related to bowel motility Outcome: Not Progressing Goal: Will not experience complications related to urinary retention Outcome: Not Progressing   Problem: Pain Managment: Goal: General experience of comfort will improve Outcome: Not Progressing   Problem: Safety: Goal: Ability to remain free from injury will improve Outcome: Not Progressing   Problem: Skin Integrity: Goal: Risk for impaired skin integrity will decrease Outcome: Not Progressing   

## 2022-08-25 NOTE — Group Note (Signed)
Recreation Therapy Group Note   Group Topic:Health and Wellness  Group Date: 08/25/2022 Start Time: 1400 End Time: 1445 Facilitators: Rosina Lowenstein, LRT, CTRS Location:  Day Room  Group Description: Seated Exercise. LRT discussed the mental and physical benefits of exercise. LRT and group discussed how physical activity can be used as a coping skill. Pt's and LRT followed along to an exercise video on the TV screen that provided a visual representation and audio description of every exercise performed. Pt's encouraged to listen to their bodies and stop at any time if they experience feelings of discomfort or pain. LRT passed out water after session was over and encouraged pts do drink and stay hydrated.  Goal Area(s) Addressed: Patient will learn benefits of physical activity. Patient will identify exercise as a coping skill.  Patient will follow multistep directions. Patient will try a new leisure interest.    Affect/Mood: N/A   Participation Level: Did not attend    Clinical Observations/Individualized Feedback: [place] did not attend group.  Plan: Continue to engage patient in RT group sessions 2-3x/week.   Rosina Lowenstein, LRT, CTRS 08/25/2022 4:15 PM

## 2022-08-25 NOTE — Progress Notes (Signed)
Patient yelling while in bed, "I'm in the wrong room", , patient encouraged to go go sleep and stop yelling. Provider called, awaiting response.

## 2022-08-25 NOTE — Group Note (Signed)
Date:  08/25/2022 Time:  1:34 AM  Group Topic/Focus:  Self Care:   The focus of this group is to help patients understand the importance of self-care in order to improve or restore emotional, physical, spiritual, interpersonal, and financial health.    Participation Level:  Did Not Attend  Participation Quality:  Resistant and Did Not Attend  Affect:   Did Not Attend  Cognitive:  Disorganized, Delusional, Hallucinating, and Lacking  Insight: None  Engagement in Group:  Distracting, Poor, and Resistant  Modes of Intervention:  Orientation  Additional Comments:    [name] 08/25/2022, 1:34 AM

## 2022-08-25 NOTE — Progress Notes (Signed)
Assumed care of patient @1930 , patient is agitated, loud and threaten to harm self and others, was unable to reason initially. Patient was offered po medications and he agreed to comply and accepted IM Ativan due to not having a po order. Patient continued to yell out presently, slam his door after been redirected to his room. Provider notified of behavior, no changes to medication given at this time. Will continue to monitor and support according to plan of care.
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
## 2022-08-25 NOTE — BHH Counselor (Signed)
CSW contacted Cari Caraway, [phone], at [facility] to inquire about the meeting that he stated was supposed to happen.  Maurine Minister stated they had a male bed available and to email information to him at [email or web address]  CSW will send information per their request.   [name], MSW, [facility] 08/25/2022 2:12 PM

## 2022-08-25 NOTE — BHH Counselor (Signed)
CSW contacted [name] at [phone] regarding status of pt's    CSW unable to reach, LVM requesting return call.   [name], MSW, [place] 08/25/2022 2:09 PM

## 2022-08-25 NOTE — Progress Notes (Signed)
Patient agitated at this time. Patient yelling "I'm going to kill all yall! I'm praying to the angel of death that they take me!" Patient continues to yell many incomprehensible things. Staff attempted to verbally de-escalate and distract patient without success. Patient entered his room, slammed his door and continued to yell incomprehensible things. Secure message sent to on-call psychiatry NP.

## 2022-08-26 DIAGNOSIS — F250 Schizoaffective disorder, bipolar type: Secondary | ICD-10-CM | POA: Diagnosis not present

## 2022-08-26 NOTE — BHH Counselor (Signed)
CSW contacted [facility], [phone]. He states his supervisors has some concerns about pt having a bed there due to his agitation.   CSW to follow-up with [name] to inform her.    [name], MSW, [place] 08/26/2022 11:43 AM

## 2022-08-26 NOTE — BHH Counselor (Signed)
CSW contacted [facility]. [name] stated that [name] would not be in until Monday 09/01/22. CSW inquired about who would be covering the her caseload.   [name] stated she did not know who would be covering the caseload.   CSW inquired if she could leave a message. CSW left a message for whomever would be covering the caseload.   CSW asked if there was any progress on pt's housing.   [name], MSW, [place] 08/26/2022 11:50 AM

## 2022-08-26 NOTE — BH IP Treatment Plan (Signed)
Interdisciplinary Treatment and Diagnostic Plan Update  08/26/2022 Time of Session: 9:00 AM  KASHAN RIVETT MRN: [id number]  Principal Diagnosis: Schizoaffective disorder, bipolar type (HCC)  Secondary Diagnoses: Principal Problem:   Schizoaffective disorder, bipolar type (HCC)   Current Medications:  Current Facility-Administered Medications  Medication Dose Route Frequency Provider Last Rate Last Admin   amantadine (SYMMETREL) capsule 100 mg  100 mg Oral BID Ardis Hughs, NP   100 mg at 08/26/22 1008   amLODipine (NORVASC) tablet 10 mg  10 mg Oral Daily Sarina Ill, DO   10 mg at 08/26/22 1008   atorvastatin (LIPITOR) tablet 40 mg  40 mg Oral QHS Ardis Hughs, NP   40 mg at 08/25/22 1942   chlorproMAZINE (THORAZINE) injection 25 mg  25 mg Intramuscular TID PRN Lewanda Rife, MD   25 mg at 08/25/22 1519   And   LORazepam (ATIVAN) injection 1 mg  1 mg Intramuscular Q8H PRN Lewanda Rife, MD   1 mg at 08/25/22 2004   chlorproMAZINE (THORAZINE) tablet 25 mg  25 mg Oral TID PRN Lewanda Rife, MD   25 mg at 08/24/22 1618   chlorproMAZINE (THORAZINE) tablet 25 mg  25 mg Oral BID Lewanda Rife, MD   25 mg at 08/26/22 1009   cloNIDine (CATAPRES) tablet 0.1 mg  0.1 mg Oral Q8H PRN Sarina Ill, DO       diphenhydrAMINE (BENADRYL) capsule 25 mg  25 mg Oral Q6H PRN Ardis Hughs, NP   25 mg at 08/25/22 1940   divalproex (DEPAKOTE) DR tablet 500 mg  500 mg Oral Q12H Lewanda Rife, MD   500 mg at 08/26/22 1009   fluticasone furoate-vilanterol (BREO ELLIPTA) 200-25 MCG/ACT 1 puff  1 puff Inhalation Daily Ardis Hughs, NP   1 puff at 08/26/22 1006   losartan (COZAAR) tablet 100 mg  100 mg Oral Daily Vernard Gambles H, NP   100 mg at 08/23/22 1058   nicotine (NICODERM CQ - dosed in mg/24 hours) patch 21 mg  21 mg Transdermal Daily Vernard Gambles H, NP       risperiDONE (RISPERDAL) tablet 3 mg  3 mg Oral BID Lewanda Rife, MD   3  mg at 08/26/22 1009   risperiDONE microspheres (RISPERDAL CONSTA) injection 25 mg  25 mg Intramuscular Q14 Days Lewanda Rife, MD   25 mg at 08/20/22 1456   traZODone (DESYREL) tablet 100 mg  100 mg Oral QHS PRN Ardis Hughs, NP   100 mg at 08/23/22 2206   PTA Medications: Medications Prior to Admission  Medication Sig Dispense Refill Last Dose   amantadine (SYMMETREL) 100 MG capsule Take 1 capsule (100 mg total) by mouth 2 (two) times daily. 60 capsule 3    amLODipine (NORVASC) 5 MG tablet Take 1 tablet (5 mg total) by mouth daily. 30 tablet 1    atorvastatin (LIPITOR) 40 MG tablet Take 1 tablet (40 mg total) by mouth at bedtime. 30 tablet 1    carbamazepine (TEGRETOL XR) 400 MG 12 hr tablet Take 1 tablet (400 mg total) by mouth at bedtime. 30 tablet 3    divalproex (DEPAKOTE) 250 MG DR tablet Take 250 mg by mouth 3 (three) times daily.      fluticasone furoate-vilanterol (BREO ELLIPTA) 200-25 MCG/ACT AEPB Inhale 1 puff into the lungs daily. 1 each 1    losartan (COZAAR) 100 MG tablet Take 1 tablet (100 mg total) by mouth daily. 30 tablet 3  Melatonin 10 MG TABS Take 10 mg by mouth at bedtime.      QUEtiapine (SEROQUEL) 25 MG tablet Take 25 mg by mouth at bedtime.      risperiDONE (RISPERDAL) 3 MG tablet Take 1 tablet (3 mg total) by mouth 2 (two) times daily at 8 am and 4 pm. 60 tablet 3    traZODone (DESYREL) 100 MG tablet Take 1 tablet (100 mg total) by mouth at bedtime as needed for sleep. (Patient not taking: Reported on 08/06/2022) 30 tablet 3     Patient Stressors: Health problems   Medication change or noncompliance    Patient Strengths: Active sense of humor  Motivation for treatment/growth   Treatment Modalities: Medication Management, Group therapy, Case management,  1 to 1 session with clinician, Psychoeducation, Recreational therapy.   Physician Treatment Plan for Primary Diagnosis: Schizoaffective disorder, bipolar type (HCC) Long Term Goal(s): Improvement in  symptoms so as ready for discharge   Short Term Goals: Ability to identify changes in lifestyle to reduce recurrence of condition will improve Ability to verbalize feelings will improve Ability to disclose and discuss suicidal ideas Ability to demonstrate self-control will improve Ability to identify and develop effective coping behaviors will improve Ability to maintain clinical measurements within normal limits will improve Compliance with prescribed medications will improve Ability to identify triggers associated with substance abuse/mental health issues will improve  Medication Management: Evaluate patient's response, side effects, and tolerance of medication regimen.  Therapeutic Interventions: 1 to 1 sessions, Unit Group sessions and Medication administration.  Evaluation of Outcomes: Not Progressing  Physician Treatment Plan for Secondary Diagnosis: Principal Problem:   Schizoaffective disorder, bipolar type (HCC)  Long Term Goal(s): Improvement in symptoms so as ready for discharge   Short Term Goals: Ability to identify changes in lifestyle to reduce recurrence of condition will improve Ability to verbalize feelings will improve Ability to disclose and discuss suicidal ideas Ability to demonstrate self-control will improve Ability to identify and develop effective coping behaviors will improve Ability to maintain clinical measurements within normal limits will improve Compliance with prescribed medications will improve Ability to identify triggers associated with substance abuse/mental health issues will improve     Medication Management: Evaluate patient's response, side effects, and tolerance of medication regimen.  Therapeutic Interventions: 1 to 1 sessions, Unit Group sessions and Medication administration.  Evaluation of Outcomes: Not Progressing   RN Treatment Plan for Primary Diagnosis: Schizoaffective disorder, bipolar type (HCC) Long Term Goal(s): Knowledge of  disease and therapeutic regimen to maintain health will improve  Short Term Goals: Ability to remain free from injury will improve, Ability to verbalize frustration and anger appropriately will improve, Ability to demonstrate self-control, Ability to participate in decision making will improve, Ability to verbalize feelings will improve, Ability to disclose and discuss suicidal ideas, Ability to identify and develop effective coping behaviors will improve, and Compliance with prescribed medications will improve  Medication Management: RN will administer medications as ordered by provider, will assess and evaluate patient's response and provide education to patient for prescribed medication. RN will report any adverse and/or side effects to prescribing provider.  Therapeutic Interventions: 1 on 1 counseling sessions, Psychoeducation, Medication administration, Evaluate responses to treatment, Monitor vital signs and CBGs as ordered, Perform/monitor CIWA, COWS, AIMS and Fall Risk screenings as ordered, Perform wound care treatments as ordered.  Evaluation of Outcomes: Not Progressing   LCSW Treatment Plan for Primary Diagnosis: Schizoaffective disorder, bipolar type (HCC) Long Term Goal(s): Safe transition to appropriate next level  of care at discharge, Engage patient in therapeutic group addressing interpersonal concerns.  Short Term Goals: Engage patient in aftercare planning with referrals and resources, Increase social support, Increase ability to appropriately verbalize feelings, Increase emotional regulation, Facilitate acceptance of mental health diagnosis and concerns, Facilitate patient progression through stages of change regarding substance use diagnoses and concerns, Identify triggers associated with mental health/substance abuse issues, and Increase skills for wellness and recovery  Therapeutic Interventions: Assess for all discharge needs, 1 to 1 time with Social worker, Explore available  resources and support systems, Assess for adequacy in community support network, Educate family and significant other(s) on suicide prevention, Complete Psychosocial Assessment, Interpersonal group therapy.  Evaluation of Outcomes: Not Progressing   Progress in Treatment: Attending groups: Yes. Participating in groups: Yes. Taking medication as prescribed: Yes. Toleration medication: Yes. Family/Significant other contact made: No, will contact:  CSW  will contact if given permission  Patient understands diagnosis: Yes. Discussing patient identified problems/goals with staff: Yes. Medical problems stabilized or resolved: Yes. Denies suicidal/homicidal ideation: Yes. Issues/concerns per patient self-inventory: No. Other: None    New problem(s) identified: No, Describe:  None identified  Update 08/21/22: No changes at this time Update 08/26/22: No changes at this time      New Short Term/Long Term Goal(s): elimination of symptoms of psychosis, medication management for mood stabilization; elimination of SI thoughts; development of comprehensive mental wellness plan. Update 08/21/22: No changes at this time Update 08/26/22: No changes at this time    Patient Goals:  Pt unable to participate in treatment team due to active psychosis. Treatment team documents were instead sent over to legal guardian at [facility] Update 08/21/22: No changes at this time Update 08/26/22: [facility] guardianship currently working with CSW on placement options for pt. Pt remains agitated and tangential in thought and speech    Discharge Plan or Barriers:  CSW to assist with appropriate discharge planning Update 08/21/22: No changes at this time  Update 08/26/22: Pt has been referred to multiple housing options by guardian, but unable to be accepted due to agitated behavior and concerns regarding pt's behavior from the facilities    Reason for Continuation of Hospitalization: Delusions  Medication  stabilization   Estimated Length of Stay: 1 to 7 daysUpdate 08/21/22: No changes at this time Update 08/26/22: No changes at this time   Last 3 [place] Suicide Severity Risk Score: Flowsheet Row Admission (Current) from 08/10/2022 in [facility] ED from 08/06/2022 in [facility] Admission (Discharged) from 05/06/2022 in [facility]  C-SSRS RISK CATEGORY No Risk No Risk No Risk       Last PHQ 2/9 Scores:     No data to display          Scribe for Treatment Team: [name], [place] 08/26/2022 1:08 PM

## 2022-08-26 NOTE — Plan of Care (Signed)
  Problem: Education: Goal: Knowledge of General Education information will improve Description: Including pain rating scale, medication(s)/side effects and non-pharmacologic comfort measures Outcome: Not Progressing   Problem: Health Behavior/Discharge Planning: Goal: Ability to manage health-related needs will improve Outcome: Not Progressing   Problem: Clinical Measurements: Goal: Ability to maintain clinical measurements within normal limits will improve Outcome: Not Progressing Goal: Will remain free from infection Outcome: Not Progressing Goal: Cardiovascular complication will be avoided Outcome: Not Progressing   Problem: Activity: Goal: Risk for activity intolerance will decrease Outcome: Not Progressing   Problem: Nutrition: Goal: Adequate nutrition will be maintained Outcome: Not Progressing

## 2022-08-26 NOTE — Progress Notes (Signed)
Patient is alert and oriented times 3. Patient fixated on discharging. Patient denies pain. He denies SI, HI, and AVH. Also denies feelings of anxiety and depression at this time. States he slept ok last night. Morning meds given whole by mouth W/O difficulty. Ate breakfast in day room- appetite good. Patient remains on unit with Q15 minute checks in place.
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
## 2022-08-26 NOTE — Progress Notes (Signed)
   08/26/22 0737  Psych Admission Type (Psych Patients Only)  Admission Status Involuntary  Psychosocial Assessment  Patient Complaints Agitation;Anxiety;Confusion;Irritability  Eye Contact Fair  Facial Expression Anxious  Affect Anxious;Irritable  Speech Argumentative;Rapid;Loud;Incoherent  Interaction Assertive  Motor Activity Unsteady  Appearance/Hygiene In scrubs  Behavior Characteristics Pacing;Irritable;Agressive verbally  Mood Anxious;Threatening  Thought Process  Coherency Circumstantial;Disorganized;Flight of ideas;Loose associations;Tangential  Content Blaming others;Preoccupation  Delusions Grandeur  Perception Hallucinations  Hallucination Auditory;Visual  Judgment Impaired  Confusion Mild  Danger to Self  Current suicidal ideation? Denies  Danger to Others  Danger to Others None reported or observed

## 2022-08-26 NOTE — BHH Counselor (Signed)
CSW contacted [name] to discuss housing options for pt.   [name] gave CSW Tammy Stanton's information, stating that she may have bed availability for me.  CSW will send pt's information to Tammy at [email or web address]    [name], MSW, [facility] 08/26/2022 2:23 PM

## 2022-08-26 NOTE — Group Note (Signed)
Recreation Therapy Group Note   Group Topic:General Recreation  Group Date: 08/26/2022 Start Time: 1400 End Time: 1500 Facilitators: Rosina Lowenstein, LRT, CTRS Location: Courtyard  Group Description: Outdoor Recreation. Patients had the option to play ring toss, corn hole or sit and listen to music while outside in the courtyard getting fresh air and sunlight. LRT and pts discussed things that they enjoy doing in their free time outside of the hospital.  Goal Area(s) Addressed: Patient will identify leisure interests.  Patient will practice healthy decision making. Patient will engage in recreation activity.   Affect/Mood: Appropriate   Participation Level: Active and Engaged   Participation Quality: Independent   Behavior: Calm and Cooperative   Speech/Thought Process: Loose association   Insight: Limited   Judgement: Good   Modes of Intervention: Activity   Patient Response to Interventions:  Attentive and Receptive   Education Outcome:  Acknowledges education   Clinical Observations/Individualized Feedback: Jaylenn was active in their participation of session activities and group discussion. Pt chose to play corn hole while outside in the courtyard. Pt mentioned "[name]" many times in group, however was able to focus on the game of corn hole. Pt is very good at it. Pt interacted well with LRT and peers duration of session.   Plan: Continue to engage patient in RT group sessions 2-3x/week.   [address], LRT, CTRS 08/26/2022 3:21 PM

## 2022-08-26 NOTE — Plan of Care (Signed)
Patient was agitated, confused, rambling thoughts and unable to follow redirections most of the shift. Pt complied with taking his po medication with lots of coercing with was ineffective, provider contacted and order additional one time medicine, Ativan 2mg , Benadryl 50mg  with proved to be more effective and patient managed to get off to slept but, remained restless and agitated and would yell out at times, but was better able to follow redirections. Will continue to monitor and support according to plan of care.   Problem: Education: Goal: Knowledge of General Education information will improve Description: Including pain rating scale, medication(s)/side effects and non-pharmacologic comfort measures Outcome: Not Progressing   Problem: Health Behavior/Discharge Planning: Goal: Ability to manage health-related needs will improve Outcome: Not Progressing   Problem: Clinical Measurements: Goal: Ability to maintain clinical measurements within normal limits will improve Outcome: Not Progressing Goal: Will remain free from infection Outcome: Progressing Goal: Diagnostic test results will improve Outcome: Not Applicable Goal: Cardiovascular complication will be avoided Outcome: Progressing   Problem: Clinical Measurements: Goal: Cardiovascular complication will be avoided Outcome: Progressing

## 2022-08-26 NOTE — Group Note (Signed)
Date:  08/26/2022 Time:  10:44 PM  Group Topic/Focus:  Healthy Communication:   The focus of this group is to discuss communication, barriers to communication, as well as healthy ways to communicate with others.    Participation Level:  Active  Participation Quality:  Attentive  Affect:  Appropriate  Cognitive:  Oriented  Insight: Improving  Engagement in Group:  Engaged  Modes of Intervention:  Discussion  Additional Comments:    [name] 08/26/2022, 10:44 PM

## 2022-08-27 DIAGNOSIS — F250 Schizoaffective disorder, bipolar type: Secondary | ICD-10-CM | POA: Diagnosis not present

## 2022-08-27 MED ORDER — FLUPHENAZINE HCL 5 MG PO TABS
5.0000 mg | ORAL_TABLET | ORAL | Status: DC
  Administered 2022-08-27: 5 mg via ORAL
  Filled 2022-08-27 (×2): qty 1

## 2022-08-27 NOTE — Progress Notes (Signed)
Pt still with disorganized thoughts and bizarre behavior. Pt reports to this RN "I am God, aliens and all". Pt then referring and introducing himself as different celebrities to this RN. Pt still appears to be responding to internal stimuli. Pt took most PM medications. Pt responds incoherently to SI/HI questions.  Q15 min safety checks implemented and continued. Pt is safe on the unit. Plan of care on going and no other concerns expressed at this time.

## 2022-08-27 NOTE — Progress Notes (Signed)
   08/27/22 0728  Psych Admission Type (Psych Patients Only)  Admission Status Involuntary  Psychosocial Assessment  Patient Complaints Agitation;Anger;Crying spells  Eye Contact Fair  Facial Expression Anxious  Affect Anxious;Irritable  Speech Argumentative;Rapid;Loud;Incoherent  Interaction Assertive  Motor Activity Unsteady  Appearance/Hygiene In scrubs  Behavior Characteristics Agressive verbally;Agitated;Anxious;Hyperactive;Impulsive;Intrusive;Irritable;Pacing  Thought Process  Coherency Circumstantial;Disorganized;Flight of ideas;Loose associations;Tangential  Content Blaming others;Preoccupation  Delusions Grandeur  Perception Hallucinations  Hallucination Auditory;Visual  Judgment Impaired  Confusion Mild  Danger to Self  Current suicidal ideation? Denies  Danger to Others  Danger to Others None reported or observed

## 2022-08-27 NOTE — Group Note (Signed)
Recreation Therapy Group Note   Group Topic:Relaxation  Group Date: 08/27/2022 Start Time: 1400 End Time: 1445 Facilitators: Rosina Lowenstein, LRT, CTRS Location: Courtyard  Group Description: Meditation. LRT and patients discussed what they know about meditation and mindfulness. LRT played a Deep Breathing Meditation exercise script for patients to follow along to. LRT and patients discussed how meditation and deep breathing can be used as a coping skill post--discharge. After the meditation, LRT took song requests from pts to listen to while getting fresh air and sunlight.    Goal Area(s) Addressed: Patient will practice using relaxation technique. Patient will identify a new coping skill.  Patient will follow multistep directions to reduce anxiety and stress.   Affect/Mood: Appropriate   Participation Level: Moderate   Participation Quality: Independent   Behavior: Calm and Cooperative   Speech/Thought Process: Loose association   Insight: Limited   Judgement: Fair    Modes of Intervention: Activity   Patient Response to Interventions:  Receptive   Education Outcome:  In group clarification offered    Clinical Observations/Individualized Feedback: [name] was somewhat active in their participation of session activities and group discussion. Pt was up walking, fidgeting with a cup or his clothing the whole time while outside. Pt kept to himself and minimally interacted with LRT or peers. Pt was calm and appropriate and not a distraction.    Plan: Continue to engage patient in RT group sessions 2-3x/week.   [address] LRT, CTRS 08/27/2022 3:13 PM

## 2022-08-27 NOTE — Plan of Care (Signed)
  Problem: Education: Goal: Knowledge of General Education information will improve Description: Including pain rating scale, medication(s)/side effects and non-pharmacologic comfort measures Outcome: Not Progressing   Problem: Health Behavior/Discharge Planning: Goal: Ability to manage health-related needs will improve Outcome: Not Progressing   Problem: Clinical Measurements: Goal: Ability to maintain clinical measurements within normal limits will improve Outcome: Not Progressing Goal: Will remain free from infection Outcome: Not Progressing Goal: Cardiovascular complication will be avoided Outcome: Not Progressing   Problem: Nutrition: Goal: Adequate nutrition will be maintained Outcome: Not Progressing   Problem: Coping: Goal: Level of anxiety will decrease Outcome: Not Progressing   Problem: Elimination: Goal: Will not experience complications related to bowel motility Outcome: Not Progressing Goal: Will not experience complications related to urinary retention Outcome: Not Progressing
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
## 2022-08-27 NOTE — Progress Notes (Signed)
Patient at nurses station yelling and cursing, very agitated. Patient unable to be redirected. IM ativan given. Will continue to monitor for changes.

## 2022-08-27 NOTE — Plan of Care (Signed)
Assumed care of patient last evening, he was pleasant, cooperative initially, seemed less irritable and confused. Later in the evening he became loud, disruptive, threaten, only accepted select medication and unable to follow redirections. Pt was given IM PRN Thorazine and Ativan which was effective and patient slept the remainder of the night without any further disruption. Will continue to monitor for safety, and assess effectiveness of current plan or care and document behaviors. Problem: Education: Goal: Knowledge of General Education information will improve Description: Including pain rating scale, medication(s)/side effects and non-pharmacologic comfort measures Outcome: Not Progressing   Problem: Health Behavior/Discharge Planning: Goal: Ability to manage health-related needs will improve Outcome: Not Progressing   Problem: Clinical Measurements: Goal: Ability to maintain clinical measurements within normal limits will improve Outcome: Not Progressing Goal: Will remain free from infection Outcome: Progressing Goal: Cardiovascular complication will be avoided Outcome: Progressing   Problem: Activity: G Problem: Nutrition: Goal: Adequate nutrition will be maintained Outcome: Progressing  oal: Risk for activity intolerance will decrease Outcome: Not Applicable

## 2022-08-28 DIAGNOSIS — F250 Schizoaffective disorder, bipolar type: Secondary | ICD-10-CM | POA: Diagnosis not present

## 2022-08-28 MED ORDER — FLUPHENAZINE HCL 2.5 MG PO TABS
2.5000 mg | ORAL_TABLET | ORAL | Status: DC
  Filled 2022-08-28: qty 1

## 2022-08-28 MED ORDER — BENZTROPINE MESYLATE 1 MG PO TABS
0.5000 mg | ORAL_TABLET | ORAL | Status: DC
  Administered 2022-08-28 – 2022-09-03 (×13): 0.5 mg via ORAL
  Filled 2022-08-28 (×13): qty 1

## 2022-08-28 MED ORDER — FLUPHENAZINE HCL 5 MG PO TABS
2.5000 mg | ORAL_TABLET | ORAL | Status: DC
  Administered 2022-08-28 – 2022-08-31 (×7): 2.5 mg via ORAL
  Filled 2022-08-28 (×8): qty 1

## 2022-08-28 NOTE — BHH Counselor (Signed)
CSW contacted [facility] [phone]), unable to reach, number goes to "[employer]" disconnection message.    [name], MSW, [place] 08/28/2022 10:05 AM

## 2022-08-28 NOTE — Group Note (Signed)
Date:  08/28/2022 Time:  10:28 AM  Group Topic/Focus:  Coping With Mental Health Crisis:   The purpose of this group is to help patients identify strategies for coping with mental health crisis.  Group discusses possible causes of crisis and ways to manage them effectively.    Participation Level:  Active  Participation Quality:  Appropriate  Affect:  Appropriate  Cognitive:  Appropriate  Insight: Appropriate  Engagement in Group:  Engaged  Modes of Intervention:  Activity  Additional Comments:    Khaila Velarde 08/28/2022, 10:28 AM

## 2022-08-28 NOTE — Group Note (Signed)
Date:  08/28/2022 Time:  9:04 PM  Group Topic/Focus:  Emotional Education:   The focus of this group is to discuss what feelings/emotions are, and how they are experienced.    Participation Level:  Active  Participation Quality:  Appropriate  Affect:  Appropriate  Cognitive:  Appropriate  Insight: Appropriate  Engagement in Group:  Engaged  Modes of Intervention:  Education  Additional Comments:    Jeannette How 08/28/2022, 9:04 PM

## 2022-08-28 NOTE — BHH Counselor (Signed)
CSW contacted [facility] per pt's request.   Unable to reach, LVM requesting return call    CSW also contacted [name] per pt's request.   Unable to reach, unable to leave voicemail due to full mailbox.   [name], MSW, [place] 08/28/2022 4:06 PM

## 2022-08-28 NOTE — BHH Counselor (Signed)
CSW contacted [facility] in [place], per [employer] suggestion, the program manager stated that she would forward CSW's information to the director and they would reach out to CSW to complete a screening with pt.    [name], MSW, [place] 08/28/2022 9:44 AM

## 2022-08-28 NOTE — BHH Counselor (Signed)
CSW contacted "[facility] SITE: [address], [place], [place], [address] [phone] Fax: [phone] - Beds : 6, Description : [facility] for Adults with Mental Illness "  They stated they would come see the pt within the next hour for an interview.   CSW informed pt.   [name], MSW, [place] 08/28/2022 9:57 AM

## 2022-08-28 NOTE — BHH Counselor (Signed)
[name] was contacted multiple times throughout the day by CSW.   They never came to see [name].   They called CSW back around 2:30 PM stating they decided not to interview [name] because he was medication non-compliant and could not go back to his old group home.   [name], MSW, [place] 08/28/2022 4:08 PM

## 2022-08-28 NOTE — Group Note (Signed)
Recreation Therapy Group Note   Group Topic:Communication  Group Date: 08/28/2022 Start Time: 1400 End Time: 1450 Facilitators: Rosina Lowenstein, LRT, CTRS Location: Courtyard  Group Description: Reminisce Cards. Patients drew a laminated card out of a bag that had a word or phrase on it. Pt encouraged to speak about a time in their life or fond memory that specifically relates to the word they chose out of the bag. An example would be: "parenthood, meals, siblings, travel, or home".  LRT prompted following questions and encouraged contribution from peers to increase communication.   Goal Area(s) Addressed: Patient will increase verbal communication by conversing with peers. Patient will contribute to group discussion with minimal prompting. Patient will reminisce a positive memory or moment in their life.   Affect/Mood: Appropriate   Participation Level: Active and Engaged   Participation Quality: Independent   Behavior: Appropriate, Calm, and Cooperative   Speech/Thought Process: Coherent   Insight: Fair   Judgement: Good   Modes of Intervention: Guided Discussion   Patient Response to Interventions:  Attentive, Engaged, Interested , and Receptive   Education Outcome:  Acknowledges education   Clinical Observations/Individualized Feedback: [name] was active in their participation of session activities and group discussion. Pt talked about a family members house he use to stay at growing up and how they had dogs that would stay in the kennel. Pt seemed more coherent and was able to form a logical meaningful sentence to interact appropriately with LRT and peers while in group. Pt excused himself early and said that he had an interview he needed to attend, which LRT later ran by SW to see if that was valid and it was.    Plan: Continue to engage patient in RT group sessions 2-3x/week.   Rosina Lowenstein, LRT, CTRS 08/28/2022 3:00 PM

## 2022-08-28 NOTE — Group Note (Signed)
Date:  08/28/2022 Time:  6:27 AM  Group Topic/Focus:  Wrap-Up Group:   The focus of this group is to help patients review their daily goal of treatment and discuss progress on daily workbooks.    Participation Level:  Active  Participation Quality:  Intrusive and Monopolizing  Affect:  Excited  Cognitive:  Alert and Disorganized  Insight: Limited  Engagement in Group:  Distracting, Limited, and Off Topic  Modes of Intervention:  Activity  Additional Comments:     Maglione,Francis Doenges E 08/28/2022, 6:27 AM
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
## 2022-08-28 NOTE — Plan of Care (Signed)
  Problem: Health Behavior/Discharge Planning: Goal: Ability to manage health-related needs will improve Outcome: Not Progressing   Problem: Nutrition: Goal: Adequate nutrition will be maintained Outcome: Progressing   Problem: Coping: Goal: Level of anxiety will decrease Outcome: Not Progressing

## 2022-08-29 DIAGNOSIS — F250 Schizoaffective disorder, bipolar type: Secondary | ICD-10-CM | POA: Diagnosis not present

## 2022-08-29 NOTE — Plan of Care (Signed)
Pt remains irritable and agitated. Hollering and screaming. Trazodone 100 mg po given for insomnia with min effect. Po med compliant. Denies SI/HI/AVH. No c/o pain/discomfort noted.   Problem: Nutrition: Goal: Adequate nutrition will be maintained Outcome: Progressing   Problem: Coping: Goal: Level of anxiety will decrease Outcome: Progressing   Problem: Elimination: Goal: Will not experience complications related to bowel motility Outcome: Progressing Goal: Will not experience complications related to urinary retention Outcome: Progressing   Problem: Pain Managment: Goal: General experience of comfort will improve Outcome: Progressing   Problem: Safety: Goal: Ability to remain free from injury will improve Outcome: Progressing   Problem: Skin Integrity: Goal: Risk for impaired skin integrity will decrease Outcome: Progressing
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
## 2022-08-29 NOTE — B[facility] Counselor (Signed)
Pt request clothes from CSW   CSW has previously provided 3 pair of underwear, a pair of sweatpants, a sweatshirt, 2 t-shirts. Socks, and a pair of shorts.   CSW stated is pt needs more clothes she will have to reach out to the guardian.   Pt reports he needs a hoodie with fur because he is cold, white Pumas, size 12 or 13, thermal underwear and cellphone.   CSW stated he could not get a cellphone but CSW will contact his guardian about getting the other items.   CSW contacted Morey Hummingbird at [phone] to discuss pt's housing and other needs  Unable to reach, LVM requesting return call  [name], MSW, [facility] 08/29/2022 2:11 PM .
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
## 2022-08-29 NOTE — Plan of Care (Signed)
  Problem: Pain Managment: Goal: General experience of comfort will improve Outcome: Progressing   Problem: Safety: Goal: Ability to remain free from injury will improve Outcome: Progressing   Problem: Skin Integrity: Goal: Risk for impaired skin integrity will decrease Outcome: Progressing   

## 2022-08-29 NOTE — Group Note (Signed)
Recreation Therapy Group Note   Group Topic:Leisure Education  Group Date: 08/29/2022 Start Time: 1400 End Time: 1450 Facilitators: Rosina Lowenstein, LRT, CTRS Location:  Day Room  Group Description: Leisure. Patients were given the option to choose from playing bingo, singing karaoke, listening to music or going outside to the courtyard. Pts collectively chose to play bingo as a group. LRT and pts discussed the meaning of leisure, the importance of participating in leisure during their free time/when they're outside of the hospital, as well as how our leisure interests can also serve as coping skills.   Goal Area(s) Addressed:  Patient will learn the definition of "leisure". Patient will practice making a positive decision. Patient will have the opportunity to try a new leisure activity. Patient will communicate with peers and LRT.    Affect/Mood: N/A   Participation Level: Did not attend    Clinical Observations/Individualized Feedback: [name] did not attend group.  Plan: Continue to engage patient in RT group sessions 2-3x/week.   Rosina Lowenstein, LRT, CTRS 08/29/2022 3:00 PM

## 2022-08-29 NOTE — Group Note (Signed)
Date:  08/29/2022 Time:  12:00 PM  Group Topic/Focus:  Activity Group:  The purpose of this group is to encourage patients to go outside in the courtyard for some fresh air and to get some exercise.    Participation Level:  Active  Participation Quality:  Appropriate  Affect:  Appropriate  Cognitive:  Appropriate  Insight: Appropriate  Engagement in Group:  Engaged  Modes of Intervention:  Activity  Additional Comments:    [name] 08/29/2022, 12:00 PM

## 2022-08-30 DIAGNOSIS — F250 Schizoaffective disorder, bipolar type: Secondary | ICD-10-CM | POA: Diagnosis not present

## 2022-08-30 MED ORDER — TRAZODONE HCL 100 MG PO TABS
100.0000 mg | ORAL_TABLET | Freq: Every day | ORAL | Status: DC
  Administered 2022-08-30 – 2022-09-30 (×27): 100 mg via ORAL
  Filled 2022-08-30 (×32): qty 1

## 2022-08-30 MED ORDER — CHLORPROMAZINE HCL 50 MG PO TABS
100.0000 mg | ORAL_TABLET | Freq: Every day | ORAL | Status: DC
  Administered 2022-09-01: 100 mg via ORAL
  Filled 2022-08-30 (×2): qty 2

## 2022-08-30 MED ORDER — OLANZAPINE 10 MG IM SOLR
10.0000 mg | Freq: Four times a day (QID) | INTRAMUSCULAR | Status: DC | PRN
  Administered 2022-08-31 – 2022-09-03 (×4): 10 mg via INTRAMUSCULAR
  Filled 2022-08-30 (×4): qty 10

## 2022-08-30 MED ORDER — OLANZAPINE 5 MG PO TABS
10.0000 mg | ORAL_TABLET | Freq: Four times a day (QID) | ORAL | Status: DC | PRN
  Administered 2022-09-03 – 2022-10-01 (×11): 10 mg via ORAL
  Filled 2022-08-30 (×10): qty 2

## 2022-08-30 MED ORDER — LORAZEPAM 1 MG PO TABS
1.0000 mg | ORAL_TABLET | ORAL | Status: DC | PRN
  Administered 2022-08-31 – 2022-09-03 (×2): 1 mg via ORAL
  Filled 2022-08-30 (×3): qty 1

## 2022-08-30 NOTE — Group Note (Signed)
Date:  08/30/2022 Time:  8:40 PM  Group Topic/Focus:  Goals Group:   The focus of this group is to help patients establish daily goals to achieve during treatment and discuss how the patient can incorporate goal setting into their daily lives to aide in recovery.    Participation Level:  Active  Participation Quality:  Sharing  Affect:  Appropriate and Flat  Cognitive:  Appropriate  Insight: Appropriate  Engagement in Group:  Engaged  Modes of Intervention:  Discussion  Additional Comments:    Burt Ek 08/30/2022, 8:40 PM
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
## 2022-08-30 NOTE — Progress Notes (Signed)
Pt has been out in dayroom much of shift . Pt appears to respond to internal stimuli and goes into his room and talks loudly, Pt has been willing to talk with staff and make his needs known.

## 2022-08-30 NOTE — Plan of Care (Signed)
  Problem: Coping: Goal: Level of anxiety will decrease 08/30/2022 1130 by Bonnita Hollow, RN Outcome: Not Progressing  D- Patient alert and oriented. Pt is cooperative with staff but still responds to internal stimuli Denies SI, HI, AVH, and pain. A- Scheduled medications administered to patient, per MD orders. Support and encouragement provided.  Routine safety checks conducted every 15 minutes.  Patient informed to notify staff with problems or concerns. R- No adverse drug reactions noted. Patient contracts for safety at this time. Patient compliant with medications and treatment plan. Patient receptive, calm, and cooperative. Patient interacts well with others on the unit.  Patient remains safe at this time.

## 2022-08-30 NOTE — Group Note (Signed)
Date:  08/30/2022 Time:  11:02 AM  Group Topic/Focus:  Community Group/Outside Rec Explaining the rules and expectations of the unit and went outside and got fresh air and listened the music.   Participation Level:  Active  Participation Quality:  Appropriate  Affect:  Appropriate  Cognitive:  Appropriate and Disorganized  Insight: Appropriate  Engagement in Group:  Engaged and Improving  Modes of Intervention:  Activity and Discussion  Additional Comments:    Marta Antu 08/30/2022, 11:02 AM

## 2022-08-30 NOTE — Plan of Care (Signed)
Pt remains irritable and agitated. RIS. PO med compliant. Trazodone 100 mg po given as ordered PRN at 2130 for insomnia with no change. Thorazine 25 mg po and benadryl 25 mg po given PRN at 0230 with min effect. Pt slept at short intervals throughout the night. Denies SI/HI/AVH. No c/o pain/discomfort noted.   Problem: Nutrition: Goal: Adequate nutrition will be maintained Outcome: Progressing   Problem: Coping: Goal: Level of anxiety will decrease Outcome: Progressing   Problem: Elimination: Goal: Will not experience complications related to bowel motility Outcome: Progressing   Problem: Pain Managment: Goal: General experience of comfort will improve Outcome: Progressing   Problem: Safety: Goal: Ability to remain free from injury will improve Outcome: Progressing

## 2022-08-30 NOTE — Plan of Care (Deleted)
.  TPMD

## 2022-08-31 DIAGNOSIS — F250 Schizoaffective disorder, bipolar type: Secondary | ICD-10-CM | POA: Diagnosis not present

## 2022-08-31 MED ORDER — FLUPHENAZINE HCL 5 MG PO TABS
5.0000 mg | ORAL_TABLET | ORAL | Status: DC
  Administered 2022-08-31 – 2022-10-02 (×62): 5 mg via ORAL
  Filled 2022-08-31 (×65): qty 1

## 2022-08-31 NOTE — BH IP Treatment Plan (Signed)
Interdisciplinary Treatment and Diagnostic Plan Update  08/31/2022 Time of Session: 10:26 am [name] MRN: [id number]  Principal Diagnosis: Schizoaffective disorder, bipolar type [facility])  Secondary Diagnoses: Principal Problem:   Schizoaffective disorder, bipolar type (HCC)   Current Medications:  Current Facility-Administered Medications  Medication Dose Route Frequency Provider Last Rate Last Admin   amantadine (SYMMETREL) capsule 100 mg  100 mg Oral BID Ardis Hughs, NP   100 mg at 08/30/22 2134   amLODipine (NORVASC) tablet 10 mg  10 mg Oral Daily Sarina Ill, DO   10 mg at 08/30/22 4098   atorvastatin (LIPITOR) tablet 40 mg  40 mg Oral QHS Ardis Hughs, NP   40 mg at 08/30/22 2133   benztropine (COGENTIN) tablet 0.5 mg  0.5 mg Oral BH-q8a4p Sarina Ill, DO   0.5 mg at 08/31/22 1191   chlorproMAZINE (THORAZINE) tablet 100 mg  100 mg Oral QHS Sarina Ill, DO       cloNIDine (CATAPRES) tablet 0.1 mg  0.1 mg Oral Q8H PRN Sarina Ill, DO       divalproex (DEPAKOTE) DR tablet 500 mg  500 mg Oral Q12H Lewanda Rife, MD   500 mg at 08/30/22 2132   fluPHENAZine (PROLIXIN) tablet 2.5 mg  2.5 mg Oral BH-q8a4p Barrie Folk, RPH   2.5 mg at 08/31/22 0814   fluticasone furoate-vilanterol (BREO ELLIPTA) 200-25 MCG/ACT 1 puff  1 puff Inhalation Daily Ardis Hughs, NP   1 puff at 08/31/22 0813   LORazepam (ATIVAN) tablet 1 mg  1 mg Oral Q4H PRN Sarina Ill, DO       losartan (COZAAR) tablet 100 mg  100 mg Oral Daily Ardis Hughs, NP   100 mg at 08/30/22 4782   nicotine (NICODERM CQ - dosed in mg/24 hours) patch 21 mg  21 mg Transdermal Daily Ardis Hughs, NP       OLANZapine (ZYPREXA) tablet 10 mg  10 mg Oral Q6H PRN Sarina Ill, DO       Or   OLANZapine (ZYPREXA) injection 10 mg  10 mg Intramuscular Q6H PRN Sarina Ill, DO       risperiDONE (RISPERDAL) tablet 3 mg  3  mg Oral BID Lewanda Rife, MD   3 mg at 08/30/22 2133   traZODone (DESYREL) tablet 100 mg  100 mg Oral QHS Sarina Ill, DO   100 mg at 08/30/22 2133   PTA Medications: Medications Prior to Admission  Medication Sig Dispense Refill Last Dose   amantadine (SYMMETREL) 100 MG capsule Take 1 capsule (100 mg total) by mouth 2 (two) times daily. 60 capsule 3    amLODipine (NORVASC) 5 MG tablet Take 1 tablet (5 mg total) by mouth daily. 30 tablet 1    atorvastatin (LIPITOR) 40 MG tablet Take 1 tablet (40 mg total) by mouth at bedtime. 30 tablet 1    carbamazepine (TEGRETOL XR) 400 MG 12 hr tablet Take 1 tablet (400 mg total) by mouth at bedtime. 30 tablet 3    divalproex (DEPAKOTE) 250 MG DR tablet Take 250 mg by mouth 3 (three) times daily.      fluticasone furoate-vilanterol (BREO ELLIPTA) 200-25 MCG/ACT AEPB Inhale 1 puff into the lungs daily. 1 each 1    losartan (COZAAR) 100 MG tablet Take 1 tablet (100 mg total) by mouth daily. 30 tablet 3    Melatonin 10 MG TABS Take 10 mg by mouth at bedtime.  QUEtiapine (SEROQUEL) 25 MG tablet Take 25 mg by mouth at bedtime.      risperiDONE (RISPERDAL) 3 MG tablet Take 1 tablet (3 mg total) by mouth 2 (two) times daily at 8 am and 4 pm. 60 tablet 3    traZODone (DESYREL) 100 MG tablet Take 1 tablet (100 mg total) by mouth at bedtime as needed for sleep. (Patient not taking: Reported on 08/06/2022) 30 tablet 3     Patient Stressors: Health problems   Medication change or noncompliance    Patient Strengths: Active sense of humor  Motivation for treatment/growth   Treatment Modalities: Medication Management, Group therapy, Case management,  1 to 1 session with clinician, Psychoeducation, Recreational therapy.   Physician Treatment Plan for Primary Diagnosis: Schizoaffective disorder, bipolar type (HCC) Long Term Goal(s): Improvement in symptoms so as ready for discharge   Short Term Goals: Ability to identify changes in lifestyle to  reduce recurrence of condition will improve Ability to verbalize feelings will improve Ability to disclose and discuss suicidal ideas Ability to demonstrate self-control will improve Ability to identify and develop effective coping behaviors will improve Ability to maintain clinical measurements within normal limits will improve Compliance with prescribed medications will improve Ability to identify triggers associated with substance abuse/mental health issues will improve  Medication Management: Evaluate patient's response, side effects, and tolerance of medication regimen.  Therapeutic Interventions: 1 to 1 sessions, Unit Group sessions and Medication administration.  Evaluation of Outcomes: Not Progressing  Physician Treatment Plan for Secondary Diagnosis: Principal Problem:   Schizoaffective disorder, bipolar type (HCC)  Long Term Goal(s): Improvement in symptoms so as ready for discharge   Short Term Goals: Ability to identify changes in lifestyle to reduce recurrence of condition will improve Ability to verbalize feelings will improve Ability to disclose and discuss suicidal ideas Ability to demonstrate self-control will improve Ability to identify and develop effective coping behaviors will improve Ability to maintain clinical measurements within normal limits will improve Compliance with prescribed medications will improve Ability to identify triggers associated with substance abuse/mental health issues will improve     Medication Management: Evaluate patient's response, side effects, and tolerance of medication regimen.  Therapeutic Interventions: 1 to 1 sessions, Unit Group sessions and Medication administration.  Evaluation of Outcomes: Not Progressing   RN Treatment Plan for Primary Diagnosis: Schizoaffective disorder, bipolar type (HCC) Long Term Goal(s): Knowledge of disease and therapeutic regimen to maintain health will improve  Short Term Goals: Ability to  remain free from injury will improve, Ability to verbalize frustration and anger appropriately will improve, Ability to demonstrate self-control, Ability to participate in decision making will improve, Ability to verbalize feelings will improve, Ability to identify and develop effective coping behaviors will improve, and Compliance with prescribed medications will improve  Medication Management: RN will administer medications as ordered by provider, will assess and evaluate patient's response and provide education to patient for prescribed medication. RN will report any adverse and/or side effects to prescribing provider.  Therapeutic Interventions: 1 on 1 counseling sessions, Psychoeducation, Medication administration, Evaluate responses to treatment, Monitor vital signs and CBGs as ordered, Perform/monitor CIWA, COWS, AIMS and Fall Risk screenings as ordered, Perform wound care treatments as ordered.  Evaluation of Outcomes: Not Progressing   LCSW Treatment Plan for Primary Diagnosis: Schizoaffective disorder, bipolar type (HCC) Long Term Goal(s): Safe transition to appropriate next level of care at discharge, Engage patient in therapeutic group addressing interpersonal concerns.  Short Term Goals: Engage patient in aftercare planning with referrals  and resources, Increase social support, Increase ability to appropriately verbalize feelings, Increase emotional regulation, Facilitate acceptance of mental health diagnosis and concerns, Facilitate patient progression through stages of change regarding substance use diagnoses and concerns, and Increase skills for wellness and recovery  Therapeutic Interventions: Assess for all discharge needs, 1 to 1 time with Social worker, Explore available resources and support systems, Assess for adequacy in community support network, Educate family and significant other(s) on suicide prevention, Complete Psychosocial Assessment, Interpersonal group  therapy.  Evaluation of Outcomes: Not Progressing   Progress in Treatment: Attending groups: Yes. Participating in groups: Yes. Taking medication as prescribed: Yes. Toleration medication: Yes. Family/Significant other contact made: Yes, individual(s) contacted:  [name] ,[phone] extension [phone]  Patient understands diagnosis: Yes. Discussing patient identified problems/goals with staff: Yes. Medical problems stabilized or resolved: Yes. Denies suicidal/homicidal ideation: Yes. Issues/concerns per patient self-inventory: No. Other: none  New problem(s) identified: No, Describe:  none  New Short Term/Long Term Goal(s): elimination of symptoms of psychosis, medication management for mood stabilization; elimination of SI thoughts; development of comprehensive mental wellness plan. Update 08/21/22: No changes at this time Update 08/26/22: No changes at this time Update 08/31/22: None at this time.    Patient Goals:  Pt unable to participate in treatment team due to active psychosis. Treatment team documents were instead sent over to legal guardian at [facility] Update 08/21/22: No changes at this time Update 08/26/22: [facility] guardianship currently working with CSW on placement options for pt. Pt remains agitated and tangential in thought and speech Update 08/31/22: None at this time.    Discharge Plan or Barriers:  CSW to assist with appropriate discharge planning Update 08/21/22: No changes at this time  Update 08/26/22: Pt has been referred to multiple housing options by guardian, but unable to be accepted due to agitated behavior and concerns regarding pt's behavior from the facilities Update 08/31/22: None at this time.    Reason for Continuation of Hospitalization: Delusions  Medication stabilization   Estimated Length of Stay: 1 to 7 daysUpdate 08/21/22: No changes at this time Update 08/26/22: No changes at this time Update 08/31/22: None at this time.   Last 3  [place] Suicide Severity Risk Score: Flowsheet Row Admission (Current) from 08/10/2022 in [facility] ED from 08/06/2022 in [facility] Emergency Department at [facility] Admission (Discharged) from 05/06/2022 in [facility] BE[facility]  C-SSRS RISK CATEGORY No Risk No Risk No Risk       Last PHQ 2/9 Scores:     No data to display          Scribe for Treatment Team: Marshell Levan, LCSW 08/31/2022 10:26 AM

## 2022-08-31 NOTE — Progress Notes (Signed)
D- Patient alert and oriented. RIS. Internally preoccupied. Denies SI, HI, AVH, and pain.  A- Scheduled medications administered to patient, per MD orders. Support and encouragement provided.  Routine safety checks conducted every 15 minutes.  Patient informed to notify staff with problems or concerns. R- No adverse drug reactions noted. Patient contracts for safety at this time. Patient compliant with medications and treatment plan. Slept at short intervals throughout the night.  Patient remains safe at this time.

## 2022-08-31 NOTE — Plan of Care (Signed)

## 2022-08-31 NOTE — Progress Notes (Signed)
Pt loud and disruptive in dayroom. Verbally aggressive and posturing towards staff.  States "I want to leave if I don't leave by tomorrow, I'm going to bang down these doors". Unable to redirect. Security called for show of support. Zyprexa 10 mg IM injection given for agitation. Tol well. Pt resting quietly in bed. No s/s of acute distress noted. Plan of care continued.

## 2022-08-31 NOTE — Group Note (Signed)
[facility] LCSW Group Therapy Note   Group Date: 08/31/2022 Start Time: 1300 End Time: 1350   Type of Therapy/Topic:  Group Therapy:  Emotion Regulation  Participation Level:  Active   Mood:  Description of Group:    The purpose of this group is to assist patients in learning to regulate negative emotions and experience positive emotions. Patients will be guided to discuss ways in which they have been vulnerable to their negative emotions. These vulnerabilities will be juxtaposed with experiences of positive emotions or situations, and patients challenged to use positive emotions to combat negative ones. Special emphasis will be placed on coping with negative emotions in conflict situations, and patients will process healthy conflict resolution skills.  Therapeutic Goals: Patient will identify two positive emotions or experiences to reflect on in order to balance out negative emotions:  Patient will label two or more emotions that they find the most difficult to experience:  Patient will be able to demonstrate positive conflict resolution skills through discussion or role plays:   Summary of Patient Progress:  Patient identified "December 31, 1937" as his favorite season during the [address]. Patient identified a "raised voice" an an emotional trigger and sang part of a [place] song as a coping mechanism. The patient presents with pressured and disorganized speech and is very difficult to understand or follow his train of thought. The patient was not able to receive any feedback in the group due to his own internal stimuli.   Therapeutic Modalities:   Cognitive Behavioral Therapy Feelings Identification Dialectical Behavioral Therapy   Azucena Kuba, LCSWA

## 2022-08-31 NOTE — Plan of Care (Signed)
Pt affect and mood seemed improved today, he was less agitated, interacted appropriately with select peer throughout the day however, he remain delusional and responds to internal stimuli. Patient was compliant with most medications, but, refused Depakote today, Will continue to monitor and support plan of care.   Problem: Education: Goal: Knowledge of General Education information will improve Description: Including pain rating scale, medication(s)/side effects and non-pharmacologic comfort measures Outcome: Progressing   Problem: Health Behavior/Discharge Planning: Goal: Ability to manage health-related needs will improve Outcome: Progressing   Problem: Clinical Measurements: Goal: Ability to maintain clinical measurements within normal limits will improve Outcome: Progressing Goal: Will remain free from infection Outcome: Not Applicable Goal: Cardiovascular complication will be avoided Outcome: Progressing   Problem: Nutrition: Goal: Adequate nutrition will be maintained Outcome: Progressing   Problem: Coping: Goal: Level of anxiety will decrease Outcome: Progressing

## 2022-08-31 NOTE — Group Note (Signed)
Date:  08/31/2022 Time:  9:27 AM  Group Topic/Focus:  Goals Group:   The focus of this group is to help patients establish daily goals to achieve during treatment and discuss how the patient can incorporate goal setting into their daily lives to aide in recovery.    Participation Level:  Minimal  Participation Quality:  Appropriate  Affect:  Labile  Cognitive:  Appropriate  Insight: Good  Engagement in Group:  Improving  Modes of Intervention:  Discussion and Education  Additional Comments:    [name] A [name] 08/31/2022, 9:27 AM
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
## 2022-08-31 NOTE — Group Note (Signed)
Date:  08/31/2022 Time:  10:49 PM  Group Topic/Focus:  Making Healthy Choices:   The focus of this group is to help patients identify negative/unhealthy choices they were using prior to admission and identify positive/healthier coping strategies to replace them upon discharge.    Participation Level:  Did Not Attend  Participation Quality:      Affect:      Cognitive:      Insight: None  Engagement in Group:  None  Modes of Intervention:      Additional Comments:    [name] 08/31/2022, 10:49 PM

## 2022-09-01 DIAGNOSIS — F250 Schizoaffective disorder, bipolar type: Secondary | ICD-10-CM | POA: Diagnosis not present

## 2022-09-01 NOTE — BHH Counselor (Signed)
CSW contacted crisis line at [employer]. They stated they would call [name] and see if she could call CSW back.   [name], MSW, [place] 09/01/2022 1:44 PM

## 2022-09-01 NOTE — BHH Counselor (Signed)
CSW contacted pt's guardian [name] [phone] (ext [phone])  Unable to reach, LVM requesting return call  [name], MSW, [facility] 09/01/2022 12:13 PM

## 2022-09-01 NOTE — Plan of Care (Signed)
?  Problem: Nutrition: ?Goal: Adequate nutrition will be maintained ?Outcome: Progressing ?  ?Problem: Coping: ?Goal: Level of anxiety will decrease ?Outcome: Progressing ?  ?Problem: Elimination: ?Goal: Will not experience complications related to bowel motility ?Outcome: Progressing ?  ?Problem: Safety: ?Goal: Ability to remain free from injury will improve ?Outcome: Progressing ?  ?Problem: Skin Integrity: ?Goal: Risk for impaired skin integrity will decrease ?Outcome: Progressing ?  ?

## 2022-09-01 NOTE — Progress Notes (Signed)
D- Patient alert and oriented. Irritable and agitated. Posturing towards staff. Denies SI, HI, AVH, and pain. A- Zyprexa IM injection administered to patient PRN for agitation. Support and encouragement provided.  Routine safety checks conducted every 15 minutes.  Patient informed to notify staff with problems or concerns. R- No adverse drug reactions noted. Patient compliant with medications and treatment plan. Pt slept throughout the night. No s/s of acute distress noted. Patient remains safe at this time.
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
## 2022-09-01 NOTE — Progress Notes (Signed)
   09/01/22 2218  Psych Admission Type (Psych Patients Only)  Admission Status Involuntary  Psychosocial Assessment  Patient Complaints Agitation;Anger;Irritability;Restlessness  Eye Contact Poor  Facial Expression Angry;Anxious  Affect Labile  Speech Argumentative;Loud;Aggressive;Pressured  Interaction Demanding;Dominating;Hostile  Motor Activity Restless  Appearance/Hygiene In scrubs  Behavior Characteristics Agressive verbally;Irritable;Agitated  Mood Irritable;Labile  Thought Process  Coherency Flight of ideas;Disorganized  Content Blaming others;Paranoia  Delusions Grandeur  Perception UTA  Hallucination UTA  Judgment Poor  Confusion Severe  Danger to Self  Current suicidal ideation? Denies

## 2022-09-01 NOTE — Progress Notes (Signed)
   09/01/22 1100  Charting Type  Charting Type Shift assessment  Safety Check Verification  Has the RN verified the 15 minute safety check completion? Yes  Neurological  Neuro (WDL) X  Orientation Level Disoriented to situation  Midwife Slurred/Dysarthria  Glasgow Coma Scale  Eye Opening 4  Best Verbal Response (NON-intubated) 4  Best Motor Response 6  Glasgow Coma Scale Score 14  HEENT  HEENT (WDL) X  Teeth Poor dental hygiene  Voice Difficulty speaking  Lips Pink;Dry  Tongue Pink;Moist  Mucous Membrane(s) Moist;Pink  Respiratory  Respiratory (WDL) WDL  Bilateral Breath Sounds Clear  Cardiac  Cardiac (WDL) WDL  Pulse Regular  ECG Monitor No  Antiarrhythmic device No  Vascular  Vascular (WDL) WDL  Integumentary  Integumentary (WDL) WDL  Braden Scale (Ages 8 and up)  Sensory Perceptions 4  Moisture 4  Activity 4  Mobility 3  Nutrition 3  Friction and Shear 3  Braden Scale Score 21  Musculoskeletal  Musculoskeletal (WDL) WDL  Gastrointestinal  Gastrointestinal (WDL) WDL  Last BM Date  08/31/22  GU Assessment  Genitourinary (WDL) WDL  Neurological  Level of Consciousness Alert   Agitated most of the day about finding a place to go. Tolerated all medications and meals. Responding to internal stimuli. AVH noted.
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
## 2022-09-01 NOTE — Plan of Care (Signed)
  Problem: Coping: Goal: Level of anxiety will decrease Outcome: Not Progressing   

## 2022-09-01 NOTE — Progress Notes (Signed)
This RN heard commotion coming from the seclusion room, this RN observes patient naked from the waist down on all fours on his mattress punching the head area of his mattress. Patient is yelling "You shut up boy!" as he is punching the mattress. Tech returns to seclusion room with ice cream and soda for patient. Patient agrees to dress and sit on the mattress and have his snack and drink. Patient aware he is not confined to seclusion room and remains in seclusion room willingly. Seclusion room door remains opened, 15 minute safety checks remain in place.

## 2022-09-01 NOTE — Group Note (Signed)
Date:  09/01/2022 Time:  9:22 PM  Group Topic/Focus:  Self Care:   The focus of this group is to help patients understand the importance of self-care in order to improve or restore emotional, physical, spiritual, interpersonal, and financial health.    Participation Level:  Did Not Attend  Participation Quality:      Affect:      Cognitive:      Insight: None  Engagement in Group:  None  Modes of Intervention:      Additional Comments:    [name] 09/01/2022, 9:22 PM

## 2022-09-01 NOTE — Group Note (Signed)
Date:  09/01/2022 Time:  10:23 AM  Group Topic/Focus:  Making Healthy Choices:   The focus of this group is to help patients identify negative/unhealthy choices they were using prior to admission and identify positive/healthier coping strategies to replace them upon discharge.    Participation Level:  Active  Participation Quality:  Appropriate  Affect:  Appropriate  Cognitive:  Appropriate  Insight: Appropriate  Engagement in Group:  Engaged  Modes of Intervention:  Activity and Discussion  Additional Comments:  None  [name] 09/01/2022, 10:23 AM

## 2022-09-01 NOTE — Progress Notes (Signed)
Patient has been yelling unintelligible things since the start of this RN's shift at 1900. Patient has been between the day room and his bedroom and was observed slamming his door. Per day staff patient has been upset most of the day about not being discharged. This RN approaches patient with medication and patient becomes irate and yells "Don't you ever call me Rodriguez again! My name is [name] or Lucifer!" This Loss adjuster, chartered to patient and patient continues to yell at this nurse. Patient keeps repeating his frustration with people "calling 911 on me". Staff attempts to reorient patient to current situation without success. Patient accepts IM Zyprexa without incident. No use of force is needed. Patient requests medication to be given into R deltoid. Medication administered and patient tolerates well. Tech attempts to obtain vital signs per unit policy patient becomes further agitated by tech approaching patient with vital cart. This RN requests tech to defer vital signs at this time. Patient sitting on his bed, yelling, in no acute distress. Patient's door is closed to decrease stimulation. 15 minute checks in place for safety.

## 2022-09-02 DIAGNOSIS — F250 Schizoaffective disorder, bipolar type: Secondary | ICD-10-CM | POA: Diagnosis not present

## 2022-09-02 MED ORDER — CHLORPROMAZINE HCL 50 MG PO TABS
200.0000 mg | ORAL_TABLET | Freq: Every day | ORAL | Status: DC
  Administered 2022-09-02 – 2022-10-01 (×27): 200 mg via ORAL
  Filled 2022-09-02 (×30): qty 4

## 2022-09-02 MED ORDER — DIVALPROEX SODIUM ER 500 MG PO TB24
1500.0000 mg | ORAL_TABLET | Freq: Every day | ORAL | Status: DC
  Administered 2022-09-02: 1500 mg via ORAL
  Filled 2022-09-02: qty 3

## 2022-09-02 NOTE — Group Note (Signed)
Date:  09/02/2022 Time:  10:31 AM  Group Topic/Focus:  Activity Group:  The focus of this group is to promote activity for the patients to allow them to get some fresh air and also allow them to exercise.     Participation Level:  Active  Participation Quality:  Appropriate  Affect:  Appropriate  Cognitive:  Appropriate  Insight: Appropriate  Engagement in Group:  Engaged  Modes of Intervention:  Activity  Additional Comments:    Mary Sella Jovoni Borkenhagen 09/02/2022, 10:31 AM

## 2022-09-02 NOTE — Plan of Care (Signed)
  Problem: Nutrition: Goal: Adequate nutrition will be maintained Outcome: Progressing   Problem: Safety: Goal: Ability to remain free from injury will improve Outcome: Progressing   Problem: Skin Integrity: Goal: Risk for impaired skin integrity will decrease Outcome: Progressing   Problem: Education: Goal: Knowledge of General Education information will improve Description: Including pain rating scale, medication(s)/side effects and non-pharmacologic comfort measures Outcome: Not Progressing

## 2022-09-02 NOTE — Group Note (Signed)
Recreation Therapy Group Note   Group Topic:Coping Skills  Group Date: 09/02/2022 Start Time: 1400 End Time: 1445 Facilitators: Rosina Lowenstein, LRT, CTRS Location:  Day Room  Group Description: Mind Map.  Patient was provided a blank template of a diagram with 32 blank boxes in a tiered system, branching from the center (similar to a bubble chart). LRT directed patients to label the middle of the diagram "Coping Skills". LRT and patients then came up with 8 different coping skills as examples. Pt were directed to record their coping skills in the 2nd tier boxes closest to the center.  Patients would then share their coping skills with the group as LRT wrote them out. LRT gave a handout of 99 different coping skills at the end of group.    Goal Area(s) Addressed: Patients will be able to define "coping skills". Patient will identify new coping skills.  Patient will increase communication.  Affect/Mood: N/A   Participation Level: Did not attend    Clinical Observations/Individualized Feedback: [name] did not attend group.  Plan: Continue to engage patient in RT group sessions 2-3x/week.   [address] LRT, CTRS 09/02/2022 3:11 PM
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
## 2022-09-02 NOTE — BHH Counselor (Signed)
CSW contacted [facility] [phone]), unable to reach, LVM.

## 2022-09-02 NOTE — Group Note (Signed)
Date:  09/02/2022 Time:  10:12 PM  Group Topic/Focus:  Goals Group:   The focus of this group is to help patients establish daily goals to achieve during treatment and discuss how the patient can incorporate goal setting into their daily lives to aide in recovery.    Participation Level:  Did Not Attend  Participation Quality:      Affect:      Cognitive:      Insight: None  Engagement in Group:  None  Modes of Intervention:      Additional Comments:    [name] 09/02/2022, 10:12 PM

## 2022-09-02 NOTE — Progress Notes (Signed)
Patient restless and intermittently irritable.  Pressured, mumbled speech.  Denies SI/HI.  Appears to be responding to internal stimuli.  Pt observed to be having conversations while alone.    Compliant with scheduled medications with social worker present.  15 min checks in place for safety.  Pt is disruptive and loud in the milieu.  Joined MTH group for fresh air in the courtyard. Also participated in SW group.   Patient continues to have episodes of yelling and agitation with no apparent trigger.  Pt will return to his room, shut the door and can be heard having an argument with someone not present in the room.

## 2022-09-02 NOTE — BHH Counselor (Signed)
CSW contacted [facility] [phone]). [facility] stated that they had 2 open male bed availabilities and would call CSW back to schedule an interview with pt.

## 2022-09-02 NOTE — Progress Notes (Signed)
Patient exits seclusion room and requests to return to his room. Tech escorts patient to his room and encourages patient to rest.

## 2022-09-02 NOTE — BHH Counselor (Signed)
CSW contacted [facility] at [phone].  Layne Benton answered the call.   Roddie Mc stated that she was familiar with pt and that he had been an "immediate discharge" from the group home due to "the safety of himself"  Roddie Mc stated she informed pt's guardian about his discharge and that it is up to the pt's guardian to find him a living situation. Roddie Mc stated there was nothing else for them to do.  CSW will inform leadership.   [name], MSW, [place] 09/02/2022 9:59 AM

## 2022-09-02 NOTE — Group Note (Signed)
[facility] LCSW Group Therapy Note   Group Date: 09/02/2022 Start Time: 1315 End Time: 1400   Type of Therapy/Topic:  Group Therapy:  Emotion Regulation  Participation Level:  Active   Mood:  Description of Group:    The purpose of this group is to assist patients in learning to regulate negative emotions and experience positive emotions. Patients will be guided to discuss ways in which they have been vulnerable to their negative emotions. These vulnerabilities will be juxtaposed with experiences of positive emotions or situations, and patients challenged to use positive emotions to combat negative ones. Special emphasis will be placed on coping with negative emotions in conflict situations, and patients will process healthy conflict resolution skills.  Therapeutic Goals: Patient will identify two positive emotions or experiences to reflect on in order to balance out negative emotions:  Patient will label two or more emotions that they find the most difficult to experience:  Patient will be able to demonstrate positive conflict resolution skills through discussion or role plays:   Summary of Patient Progress:   Pt actively engaged with peers and facilitator. Pt unable to stay on topic and had to be redirected but did attempt to discuss emotional regulations     Therapeutic Modalities:   Cognitive Behavioral Therapy Feelings Identification Dialectical Behavioral Therapy   [name], LCSWA

## 2022-09-03 DIAGNOSIS — F250 Schizoaffective disorder, bipolar type: Secondary | ICD-10-CM | POA: Diagnosis not present

## 2022-09-03 MED ORDER — LORAZEPAM 1 MG PO TABS
2.0000 mg | ORAL_TABLET | ORAL | Status: DC | PRN
Start: 2022-09-03 — End: 2022-09-03

## 2022-09-03 MED ORDER — LORAZEPAM 2 MG/ML IJ SOLN
2.0000 mg | INTRAMUSCULAR | Status: DC | PRN
  Administered 2022-09-03: 2 mg via INTRAMUSCULAR

## 2022-09-03 MED ORDER — LORAZEPAM 2 MG/ML IJ SOLN: 2.0000 mg | Freq: Four times a day (QID) | INTRAMUSCULAR | Status: DC | PRN

## 2022-09-03 MED ORDER — CARBAMAZEPINE ER 200 MG PO TB12
200.0000 mg | ORAL_TABLET | Freq: Two times a day (BID) | ORAL | Status: DC
  Administered 2022-09-03 – 2022-10-02 (×57): 200 mg via ORAL
  Filled 2022-09-03 (×59): qty 1

## 2022-09-03 MED ORDER — LORAZEPAM 1 MG PO TABS
2.0000 mg | ORAL_TABLET | ORAL | Status: DC | PRN
  Administered 2022-09-03 – 2022-09-14 (×2): 2 mg via ORAL
  Filled 2022-09-03 (×2): qty 2

## 2022-09-03 MED ORDER — DIPHENHYDRAMINE HCL 25 MG PO CAPS
50.0000 mg | ORAL_CAPSULE | ORAL | Status: DC
  Administered 2022-09-03 – 2022-10-02 (×55): 50 mg via ORAL
  Filled 2022-09-03 (×55): qty 2

## 2022-09-03 MED ORDER — LORAZEPAM 2 MG/ML IJ SOLN
2.0000 mg | INTRAMUSCULAR | Status: DC | PRN
  Filled 2022-09-03: qty 1

## 2022-09-03 MED ORDER — LORAZEPAM 1 MG PO TABS: 2.0000 mg | ORAL_TABLET | ORAL | Status: DC | PRN

## 2022-09-03 NOTE — Progress Notes (Signed)
PRN Zyprexa did not have any effect on patients behavior. He continues to shout, curse, slam doors, call staff out their names, and banging on the walls. PRN Ativan given IM.

## 2022-09-03 NOTE — Plan of Care (Signed)
  Problem: Education: Goal: Knowledge of General Education information will improve Description: Including pain rating scale, medication(s)/side effects and non-pharmacologic comfort measures Outcome: Not Progressing   Problem: Health Behavior/Discharge Planning: Goal: Ability to manage health-related needs will improve Outcome: Not Progressing   Problem: Clinical Measurements: Goal: Ability to maintain clinical measurements within normal limits will improve Outcome: Not Progressing Goal: Cardiovascular complication will be avoided Outcome: Not Progressing   Problem: Nutrition: Goal: Adequate nutrition will be maintained Outcome: Not Progressing   Problem: Coping: Goal: Level of anxiety will decrease Outcome: Not Progressing   Problem: Elimination: Goal: Will not experience complications related to bowel motility Outcome: Not Progressing   Problem: Safety: Goal: Ability to remain free from injury will improve Outcome: Not Progressing   Problem: Skin Integrity: Goal: Risk for impaired skin integrity will decrease Outcome: Not Progressing

## 2022-09-03 NOTE — Progress Notes (Signed)
0038 PRN Ativan given for anxiety, agitation, unable to be redirected.  0334 Pt noted RASS +3, yelling disrupting milieu while peers are sleeping, responding to internal stimuli. PRN Zyprexa given. Patient ran out of room towards exit door, banging on door trying to elope. RN and techs redirected patient on appropriate behavior. Patient returned to room after multiple requests to step away from door.   0400 Patient continued to yell while in room responding to internal stimuli.    09/02/22 2000  Psych Admission Type (Psych Patients Only)  Admission Status Involuntary  Psychosocial Assessment  Patient Complaints Irritability  Eye Contact Brief  Facial Expression Flat  Affect Preoccupied  Speech Pressured;Slurred  Interaction Demanding  Motor Activity Restless  Appearance/Hygiene In scrubs  Behavior Characteristics Impulsive;Irritable  Mood Preoccupied  Thought Process  Coherency Disorganized  Content Delusions  Delusions Grandeur  Perception UTA  Hallucination Auditory  Judgment Poor  Confusion Moderate  Danger to Self  Current suicidal ideation? Denies  Self-Injurious Behavior No self-injurious ideation or behavior indicators observed or expressed   Agreement Not to Harm Self Yes  Description of Agreement Verbal
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
## 2022-09-03 NOTE — Progress Notes (Signed)
Patient yelling in dayroom, cursing at staff and the air, punching the air, and slamming doors. PRN PO Zyprexa given. Will continue to monitor for changes.

## 2022-09-03 NOTE — Group Note (Signed)
Recreation Therapy Group Note   Group Topic:Coping Skills  Group Date: 09/03/2022 Start Time: 0930 End Time: 1020 Facilitators: Rosina Lowenstein, LRT, CTRS Location: Courtyard  Group Description: Music. Patients encouraged to name their favorite song(s) for LRT to play song through speaker for group to hear. Patient educated on the definition of leisure and the importance of having different leisure interests outside of the hospital. Group discussed how leisure activities can often be used as Pharmacologist and that listening to music is one example.   Goal Area(s) Addressed:  Patient will identify a current leisure interest.  Patient will practice making a positive decision. Patient will have the opportunity to try a new leisure activity.   Affect/Mood: Appropriate and Full range   Participation Level: Hyperverbal   Participation Quality: Independent   Behavior: Alert   Speech/Thought Process: Delusional   Insight: Limited   Judgement: Fair    Modes of Intervention: Activity   Patient Response to Interventions:  Receptive   Education Outcome:  In group clarification offered    Clinical Observations/Individualized Feedback: Joelle was active in their participation of session activities and group discussion. Pt identified "[place] by [place]" as his favorite song. Pt was hyper-verbal yet pleasant. Pt got up and was singing and dancing in the courtyard.    Plan: Continue to engage patient in RT group sessions 2-3x/week.   [address] LRT, CTRS 09/03/2022 12:19 PM

## 2022-09-03 NOTE — Group Note (Signed)
Date:  09/03/2022 Time:  8:22 PM  Group Topic/Focus:  Goals Group:   The focus of this group is to help patients establish daily goals to achieve during treatment and discuss how the patient can incorporate goal setting into their daily lives to aide in recovery.    Participation Level:  Did Not Attend  Participation Quality:   Did Not Attend  Affect:   Did Not Attend  Cognitive:   Did Not Attend  Insight: None  Engagement in Group:  None  Modes of Intervention:   Did Not Attend  Additional Comments:    Burt Ek 09/03/2022, 8:22 PM

## 2022-09-03 NOTE — Plan of Care (Signed)
  Problem: Education: Goal: Knowledge of General Education information will improve Description: Including pain rating scale, medication(s)/side effects and non-pharmacologic comfort measures Outcome: Not Progressing   Problem: Health Behavior/Discharge Planning: Goal: Ability to manage health-related needs will improve Outcome: Not Progressing   Problem: Clinical Measurements: Goal: Ability to maintain clinical measurements within normal limits will improve Outcome: Not Progressing Goal: Cardiovascular complication will be avoided Outcome: Not Progressing   Problem: Nutrition: Goal: Adequate nutrition will be maintained Outcome: Not Progressing   Problem: Coping: Goal: Level of anxiety will decrease Outcome: Not Progressing

## 2022-09-03 NOTE — BHH Counselor (Signed)
CSW contacted pt's guardian [name] regarding pt's placement and pt retrieving his belongings from the group home, as he currently has no clothing.  Unable to reach, LVM requesting return call   [name], MSW, [facility] 09/03/2022 3:15 PM

## 2022-09-03 NOTE — Progress Notes (Signed)
   09/03/22 0741  Psych Admission Type (Psych Patients Only)  Admission Status Involuntary  Psychosocial Assessment  Patient Complaints Agitation;Anger;Crying spells;Irritability;Restlessness;Suspiciousness  Eye Contact Poor  Facial Expression Angry;Animated  Affect Angry;Preoccupied  Speech Aggressive;Pressured;Loud;Slurred  Interaction Assertive;Demanding;Hostile  Motor Activity Hyperactive;Restless  Appearance/Hygiene In scrubs  Behavior Characteristics Agressive verbally;Aggressive physically;Agitated;Anxious;Hyperactive;Impulsive;Irritable  Mood Labile;Irritable;Preoccupied;Threatening  Thought Process  Coherency Disorganized;Tangential  Content Paranoia;Preoccupation;Obsessions;Delusions  Delusions Grandeur  Perception WDL  Hallucination Auditory;Visual  Judgment Poor  Confusion Mild  Danger to Self  Current suicidal ideation? Denies

## 2022-09-04 DIAGNOSIS — F250 Schizoaffective disorder, bipolar type: Secondary | ICD-10-CM | POA: Diagnosis not present

## 2022-09-04 NOTE — BHH Counselor (Signed)
CSW contacted pt's guardian [name] [phone] (ext [phone])   Unable to reach, LVM.

## 2022-09-04 NOTE — Group Note (Unsigned)
Date:  09/04/2022 Time:  9:15 PM  Group Topic/Focus:  Self Care:   The focus of this group is to help patients understand the importance of self-care in order to improve or restore emotional, physical, spiritual, interpersonal, and financial health.     Participation Level:  {BHH PARTICIPATION ZOXWR:60454}  Participation Quality:  {BHH PARTICIPATION QUALITY:22265}  Affect:  {BHH AFFECT:22266}  Cognitive:  {BHH COGNITIVE:22267}  Insight: {BHH Insight2:20797}  Engagement in Group:  {BHH ENGAGEMENT IN UJWJX:91478}  Modes of Intervention:  {BHH MODES OF INTERVENTION:22269}  Additional Comments:  ***  Burt Ek 09/04/2022, 9:15 PM

## 2022-09-04 NOTE — Progress Notes (Signed)
Patient is [facility]C'd to [facility] since 08/10/22 for SAD/Bipolar disorder in need of placement. Patient slept last night and awoke c/o grogginess.  At first refused medication but then took them except for bp med.  Has been tearful today multiple times saying he wants to go home. Has been in and out of dayroom and participated in group today. Denies S/I, H/I and AVH.  Will continue to monitor.

## 2022-09-04 NOTE — Group Note (Signed)
LCSW Group Therapy Note  Group Date: 09/04/2022 Start Time: 1315 End Time: 1400   Type of Therapy and Topic:  Group Therapy - Healthy vs Unhealthy Coping Skills  Participation Level:  Active   Description of Group The focus of this group was to determine what unhealthy coping techniques typically are used by group members and what healthy coping techniques would be helpful in coping with various problems. Patients were guided in becoming aware of the differences between healthy and unhealthy coping techniques. Patients were asked to identify 2-3 healthy coping skills they would like to learn to use more effectively.  Therapeutic Goals Patients learned that coping is what human beings do all day long to deal with various situations in their lives Patients defined and discussed healthy vs unhealthy coping techniques Patients identified their preferred coping techniques and identified whether these were healthy or unhealthy Patients determined 2-3 healthy coping skills they would like to become more familiar with and use more often. Patients provided support and ideas to each other   Summary of Patient Progress:  During group, [name] expressed understanding of topic. Patient proved open to input from peers and feedback from CSW. Patient demonstrated fair insight into the subject matter, was respectful of peers, and participated throughout the entire session.   Therapeutic Modalities Cognitive Behavioral Therapy Motivational Interviewing  [name], [place] 09/04/2022  2:26 PM

## 2022-09-04 NOTE — Group Note (Signed)
Date:  09/04/2022 Time:  9:07 PM  Group Topic/Focus:  Self Care:   The focus of this group is to help patients understand the importance of self-care in order to improve or restore emotional, physical, spiritual, interpersonal, and financial health.    Participation Level:  Active  Participation Quality:  Appropriate  Affect:  Appropriate  Cognitive:  Appropriate  Insight: Appropriate  Engagement in Group:  Engaged  Modes of Intervention:  Discussion  Additional Comments:    Burt Ek 09/04/2022, 9:07 PM

## 2022-09-04 NOTE — BHH Counselor (Signed)
CSW contacted [name] [phone], unable to reach, lvm

## 2022-09-04 NOTE — Progress Notes (Signed)
Pt rested in bed majority of shift. Calm, compliant. No acute concerns overnight.   09/03/22 2300  Psych Admission Type (Psych Patients Only)  Admission Status Involuntary  Psychosocial Assessment  Patient Complaints Sleep disturbance;Hyperactivity;Irritability;Confusion  Eye Contact Poor  Facial Expression Animated  Affect Labile  Speech Loud;Pressured;Rapid  Acupuncturist;Restless  Appearance/Hygiene In scrubs  Behavior Characteristics Impulsive  Mood Labile  Thought Process  Coherency Disorganized  Content Delusions  Delusions Grandeur  Perception UTA  Hallucination Auditory  Judgment Poor  Confusion Mild  Danger to Self  Current suicidal ideation? Denies  Self-Injurious Behavior No self-injurious ideation or behavior indicators observed or expressed   Agreement Not to Harm Self Yes  Description of Agreement Verbal

## 2022-09-04 NOTE — Group Note (Signed)
Date:  09/04/2022 Time:  11:23 AM  Group Topic/Focus:  Outside Rec/Music Therapy  The purpose of this group is to get patients outside and get fresh air while listening to therapeutic music.   Participation Level:  Active  Participation Quality:  Appropriate  Affect:  Appropriate  Cognitive:  Appropriate and Disorganized  Insight: Appropriate  Engagement in Group:  Improving  Modes of Intervention:  Activity  Additional Comments:    Marta Antu 09/04/2022, 11:23 AM
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
## 2022-09-04 NOTE — Group Note (Signed)
Date:  09/04/2022 Time:  4:20 PM  Group Topic/Focus:  Therapeutic exercises that support impactful group sessions.  The cards serve as stand-alone treatment activities to help clients understand therapeutic concepts, build coping skills, and generalize their skills outside of the group.     Participation Level:  Minimal  Participation Quality:  Appropriate, Inattentive, and Redirectable  Affect:  Irritable and Resistant  Cognitive:  Appropriate and Lacking  Insight: Lacking and Limited  Engagement in Group:  Lacking, Off Topic, and Resistant  Modes of Intervention:  Activity, Confrontation, and Limit-setting  Additional Comments:    Sylena Lotter 09/04/2022, 4:20 PM

## 2022-09-04 NOTE — Group Note (Signed)
Recreation Therapy Group Note   Group Topic:Relaxation  Group Date: 09/04/2022 Start Time: 1400 End Time: 1445 Facilitators: Clinton Gallant, CTRS Location: Day Room  Group Description: Chair Yoga. LRT and patients discussed the benefits of yoga and how it differs from strength exercises. LRT educated patients on the mental and physical benefits of yoga and deep breathing and how it can be used as a Associate Professor. LRT and patients followed along to a guided yoga session on the television that focused on all parts of the body, as well as deep breathing. Pt encouraged to stop movement at any time if they feel discomfort or pain.   Goal Area(s) Addressed: Patient will practice using relaxation technique. Patient will identify a new coping skill.  Patient will follow multistep directions to reduce anxiety and stress.   Affect/Mood: Appropriate   Participation Level: Active and Engaged   Participation Quality: Independent   Behavior: Appropriate and Cooperative   Speech/Thought Process: Coherent   Insight: Fair   Judgement: Fair    Modes of Intervention: Activity   Patient Response to Interventions:  Receptive   Education Outcome:  Acknowledges education   Clinical Observations/Individualized Feedback: [name] was mostly active in their participation of session activities and group discussion. Pt was noted to have been talking to himself briefly during group, however it was not a distraction, therefore required no redirection from LRT. Pt interacted well with LRT and peers duration of session.   Plan: Continue to engage patient in RT group sessions 2-3x/week.   [address], LRT, CTRS 09/04/2022 3:13 PM

## 2022-09-05 DIAGNOSIS — F250 Schizoaffective disorder, bipolar type: Secondary | ICD-10-CM | POA: Diagnosis not present

## 2022-09-05 NOTE — Group Note (Unsigned)
Date:  09/05/2022 Time:  10:24 AM  Group Topic/Focus:  Goals Group:   The focus of this group is to help patients establish daily goals to achieve during treatment and discuss how the patient can incorporate goal setting into their daily lives to aide in recovery.     Participation Level:  {BHH PARTICIPATION WUJWJ:19147}  Participation Quality:  {BHH PARTICIPATION QUALITY:22265}  Affect:  {BHH AFFECT:22266}  Cognitive:  {BHH COGNITIVE:22267}  Insight: {BHH Insight2:20797}  Engagement in Group:  {BHH ENGAGEMENT IN WGNFA:21308}  Modes of Intervention:  {BHH MODES OF INTERVENTION:22269}  Additional Comments:  ***  [name] 09/05/2022, 10:24 AM

## 2022-09-05 NOTE — Progress Notes (Signed)
   09/05/22 0723  Psych Admission Type (Psych Patients Only)  Admission Status Involuntary  Psychosocial Assessment  Patient Complaints Agitation;Anger;Irritability  Eye Contact Fair  Facial Expression Angry;Animated;Pained  Affect Angry;Irritable;Labile  Speech Pressured;Incoherent  Interaction Childlike  Motor Activity Other (Comment)  Appearance/Hygiene Bizarre  Behavior Characteristics Agitated;Impulsive  Mood Preoccupied  Thought Process  Coherency Tangential;Flight of ideas;Disorganized  Content Delusions;Preoccupation  Delusions Grandeur;Paranoid  Perception WDL  Hallucination Auditory;Visual  Judgment Impaired  Confusion Mild  Danger to Self  Current suicidal ideation? Denies  Danger to Others  Danger to Others None reported or observed

## 2022-09-05 NOTE — BH IP Treatment Plan (Signed)
Interdisciplinary Treatment and Diagnostic Plan Update  09/05/2022 Time of Session: 9:00 AM  [name] MRN: [id number]  Principal Diagnosis: Schizoaffective disorder, bipolar type (HCC)  Secondary Diagnoses: Principal Problem:   Schizoaffective disorder, bipolar type (HCC)   Current Medications:  Current Facility-Administered Medications  Medication Dose Route Frequency Provider Last Rate Last Admin   amLODipine (NORVASC) tablet 10 mg  10 mg Oral Daily Sarina Ill, DO   10 mg at 09/02/22 4098   atorvastatin (LIPITOR) tablet 40 mg  40 mg Oral QHS Ardis Hughs, NP   40 mg at 09/04/22 2116   carbamazepine (TEGRETOL XR) 12 hr tablet 200 mg  200 mg Oral BID Sarina Ill, DO   200 mg at 09/05/22 0920   chlorproMAZINE (THORAZINE) tablet 200 mg  200 mg Oral QHS Sarina Ill, DO   200 mg at 09/04/22 2116   cloNIDine (CATAPRES) tablet 0.1 mg  0.1 mg Oral Q8H PRN Sarina Ill, DO       diphenhydrAMINE (BENADRYL) capsule 50 mg  50 mg Oral BH-q8a4p Sarina Ill, DO   50 mg at 09/05/22 0920   fluPHENAZine (PROLIXIN) tablet 5 mg  5 mg Oral BH-q8a4p Sarina Ill, DO   5 mg at 09/05/22 0920   fluticasone furoate-vilanterol (BREO ELLIPTA) 200-25 MCG/ACT 1 puff  1 puff Inhalation Daily Ardis Hughs, NP   1 puff at 09/04/22 0841   LORazepam (ATIVAN) injection 2 mg  2 mg Intramuscular Q4H PRN Sarina Ill, DO   2 mg at 09/03/22 1055   Or   LORazepam (ATIVAN) tablet 2 mg  2 mg Oral Q4H PRN Sarina Ill, DO   2 mg at 09/03/22 2038   losartan (COZAAR) tablet 100 mg  100 mg Oral Daily Ardis Hughs, NP   100 mg at 09/05/22 0920   OLANZapine (ZYPREXA) tablet 10 mg  10 mg Oral Q6H PRN Sarina Ill, DO   10 mg at 09/03/22 1191   Or   OLANZapine (ZYPREXA) injection 10 mg  10 mg Intramuscular Q6H PRN Sarina Ill, DO   10 mg at 09/03/22 0334   traZODone (DESYREL) tablet 100 mg  100  mg Oral QHS Sarina Ill, DO   100 mg at 09/04/22 2116   PTA Medications: Medications Prior to Admission  Medication Sig Dispense Refill Last Dose   amantadine (SYMMETREL) 100 MG capsule Take 1 capsule (100 mg total) by mouth 2 (two) times daily. 60 capsule 3    amLODipine (NORVASC) 5 MG tablet Take 1 tablet (5 mg total) by mouth daily. 30 tablet 1    atorvastatin (LIPITOR) 40 MG tablet Take 1 tablet (40 mg total) by mouth at bedtime. 30 tablet 1    carbamazepine (TEGRETOL XR) 400 MG 12 hr tablet Take 1 tablet (400 mg total) by mouth at bedtime. 30 tablet 3    divalproex (DEPAKOTE) 250 MG DR tablet Take 250 mg by mouth 3 (three) times daily.      fluticasone furoate-vilanterol (BREO ELLIPTA) 200-25 MCG/ACT AEPB Inhale 1 puff into the lungs daily. 1 each 1    losartan (COZAAR) 100 MG tablet Take 1 tablet (100 mg total) by mouth daily. 30 tablet 3    Melatonin 10 MG TABS Take 10 mg by mouth at bedtime.      QUEtiapine (SEROQUEL) 25 MG tablet Take 25 mg by mouth at bedtime.      risperiDONE (RISPERDAL) 3 MG tablet Take 1 tablet (  3 mg total) by mouth 2 (two) times daily at 8 am and 4 pm. 60 tablet 3    traZODone (DESYREL) 100 MG tablet Take 1 tablet (100 mg total) by mouth at bedtime as needed for sleep. (Patient not taking: Reported on 08/06/2022) 30 tablet 3     Patient Stressors: Health problems   Medication change or noncompliance    Patient Strengths: Active sense of humor  Motivation for treatment/growth   Treatment Modalities: Medication Management, Group therapy, Case management,  1 to 1 session with clinician, Psychoeducation, Recreational therapy.   Physician Treatment Plan for Primary Diagnosis: Schizoaffective disorder, bipolar type (HCC) Long Term Goal(s): Improvement in symptoms so as ready for discharge   Short Term Goals: Ability to identify changes in lifestyle to reduce recurrence of condition will improve Ability to verbalize feelings will improve Ability to  disclose and discuss suicidal ideas Ability to demonstrate self-control will improve Ability to identify and develop effective coping behaviors will improve Ability to maintain clinical measurements within normal limits will improve Compliance with prescribed medications will improve Ability to identify triggers associated with substance abuse/mental health issues will improve  Medication Management: Evaluate patient's response, side effects, and tolerance of medication regimen.  Therapeutic Interventions: 1 to 1 sessions, Unit Group sessions and Medication administration.  Evaluation of Outcomes: Not Progressing  Physician Treatment Plan for Secondary Diagnosis: Principal Problem:   Schizoaffective disorder, bipolar type (HCC)  Long Term Goal(s): Improvement in symptoms so as ready for discharge   Short Term Goals: Ability to identify changes in lifestyle to reduce recurrence of condition will improve Ability to verbalize feelings will improve Ability to disclose and discuss suicidal ideas Ability to demonstrate self-control will improve Ability to identify and develop effective coping behaviors will improve Ability to maintain clinical measurements within normal limits will improve Compliance with prescribed medications will improve Ability to identify triggers associated with substance abuse/mental health issues will improve     Medication Management: Evaluate patient's response, side effects, and tolerance of medication regimen.  Therapeutic Interventions: 1 to 1 sessions, Unit Group sessions and Medication administration.  Evaluation of Outcomes: Not Progressing   RN Treatment Plan for Primary Diagnosis: Schizoaffective disorder, bipolar type (HCC) Long Term Goal(s): Knowledge of disease and therapeutic regimen to maintain health will improve  Short Term Goals: Ability to remain free from injury will improve, Ability to verbalize frustration and anger appropriately will  improve, Ability to demonstrate self-control, Ability to participate in decision making will improve, Ability to verbalize feelings will improve, Ability to disclose and discuss suicidal ideas, Ability to identify and develop effective coping behaviors will improve, and Compliance with prescribed medications will improve  Medication Management: RN will administer medications as ordered by provider, will assess and evaluate patient's response and provide education to patient for prescribed medication. RN will report any adverse and/or side effects to prescribing provider.  Therapeutic Interventions: 1 on 1 counseling sessions, Psychoeducation, Medication administration, Evaluate responses to treatment, Monitor vital signs and CBGs as ordered, Perform/monitor CIWA, COWS, AIMS and Fall Risk screenings as ordered, Perform wound care treatments as ordered.  Evaluation of Outcomes: Not Progressing   LCSW Treatment Plan for Primary Diagnosis: Schizoaffective disorder, bipolar type (HCC) Long Term Goal(s): Safe transition to appropriate next level of care at discharge, Engage patient in therapeutic group addressing interpersonal concerns.  Short Term Goals: Engage patient in aftercare planning with referrals and resources, Increase social support, Increase ability to appropriately verbalize feelings, Increase emotional regulation, Facilitate acceptance of mental  health diagnosis and concerns, Facilitate patient progression through stages of change regarding substance use diagnoses and concerns, Identify triggers associated with mental health/substance abuse issues, and Increase skills for wellness and recovery  Therapeutic Interventions: Assess for all discharge needs, 1 to 1 time with Social worker, Explore available resources and support systems, Assess for adequacy in community support network, Educate family and significant other(s) on suicide prevention, Complete Psychosocial Assessment, Interpersonal  group therapy.  Evaluation of Outcomes: Not Progressing   Progress in Treatment: Attending groups: Yes. Participating in groups: Yes. Taking medication as prescribed: Yes. and No. Toleration medication: Yes. Family/Significant other contact made: Yes, individual(s) contacted:  [name], guardian, [name], friend  Patient understands diagnosis: Yes. Discussing patient identified problems/goals with staff: Yes. Medical problems stabilized or resolved: Yes. Denies suicidal/homicidal ideation: Yes. Issues/concerns per patient self-inventory: No. Other: None   New problem(s) identified: No, Describe:  none   New Short Term/Long Term Goal(s): elimination of symptoms of psychosis, medication management for mood stabilization; elimination of SI thoughts; development of comprehensive mental wellness plan. Update 08/21/22: No changes at this time Update 08/26/22: No changes at this time Update 08/31/22: None at this time. Update 09/05/22: No changes at this time   Patient Goals:  Pt unable to participate in treatment team due to active psychosis. Treatment team documents were instead sent over to legal guardian at [facility] Update 08/21/22: No changes at this time Update 08/26/22: [facility] guardianship currently working with CSW on placement options for pt. Pt remains agitated and tangential in thought and speech Update 08/31/22: None at this time. Update 09/05/22: No changes at this time    Discharge Plan or Barriers:  CSW to assist with appropriate discharge planning Update 08/21/22: No changes at this time  Update 08/26/22: Pt has been referred to multiple housing options by guardian, but unable to be accepted due to agitated behavior and concerns regarding pt's behavior from the facilities Update 08/31/22: None at this time. Update 09/05/22: No changes at this time    Reason for Continuation of Hospitalization: Delusions  Medication stabilization   Estimated Length of Stay: 1 to  7 daysUpdate 08/21/22: No changes at this time Update 08/26/22: No changes at this time Update 08/31/22: None at this time. Update 09/05/22: No changes at this time   Last 3 [place] Suicide Severity Risk Score: Flowsheet Row Admission (Current) from 08/10/2022 in [facility] BEHAVIORAL MEDIC[facility] ED from 08/06/2022 in [facility] Emergency Department at [facility] Admission (Discharged) from 05/06/2022 in [facility] BEHAVIORAL MEDICINE  C-SSRS RISK CATEGORY No Risk No Risk No Risk       Last PHQ 2/9 Scores:     No data to display          Scribe for Treatment Team: [name], Theresia Majors 09/05/2022 11:55 AM

## 2022-09-05 NOTE — BHH Counselor (Signed)
CSW called Materials engineer, supervisor at [employer] guardianship.   CSW expressed concerns about lack of return calls from pt's guardian [name].   Cassandra states she will talk to [place] about this.   Cassandra states she will send CSW pt's med list from when he was most stable.   Cassandra states she will follow-up with pt's payee about his money and his group home about placement.   Cassandra states she will call CSW with updates.    [name], MSW, [place] 09/05/2022 3:29 PM

## 2022-09-05 NOTE — Progress Notes (Signed)
D- Patient alert and oriented. Angry affect. Labile mood. States he's ready to go home. Denies SI, HI, AV and pain.   A- Scheduled medications administered to patient, per MD orders. Support and encouragement provided.  Routine safety checks conducted every 15 minutes.  Patient informed to notify staff with problems or concerns. R- No adverse drug reactions noted. Patient contracts for safety at this time. Patient compliant with medications and treatment plan. Patient receptive to care. Patient interacts well with others on the unit.  Patient remains safe at this time.
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
## 2022-09-05 NOTE — Group Note (Signed)
Recreation Therapy Group Note   Group Topic:Emotion Expression  Group Date: 09/05/2022 Start Time: 1400 End Time: 1440 Facilitators: Rosina Lowenstein, LRT, CTRS Location:  Dayroom  Group Description: Positivity Collage. LRT and patients discussed the importance of having a positive mindset and being happy. Patients received magazines, safety scissors, a glue stick and a piece of paper. Pts were encouraged to find images or words in the magazines that showed "happiness" or positivity to them. Pt shared their collage with the group once they were finished. LRT and pts discussed how it can be difficult to always have a positive mindset, especially when they have mental health challenges.    Goal Area(s) Addressed:  Pt will identify things associate with positivity. Pt will reduce negative thinking. Pt will identify a new coping skill of thinking positive thoughts.     Affect/Mood: Appropriate   Participation Level: Active and Engaged   Participation Quality: Independent   Behavior: Appropriate, Calm, and Cooperative   Speech/Thought Process: Coherent and Loose association   Insight: Good   Judgement: Good   Modes of Intervention: Art and Activity   Patient Response to Interventions:  Attentive, Engaged, Interested , and Receptive   Education Outcome:  Acknowledges education   Clinical Observations/Individualized Feedback: [name] was active in their participation of session activities and group discussion. Pt identified "I like basketball so here's a picture of [place]. Baseball because I like that and Princess [place]ian[place]." Pt appropriately found images to reflect these. Pt did not talk to himself at all and was able to have a coherent and logical conversation when explaining his art. Pt interacted well with LRT and peers duration of session.    Plan: Continue to engage patient in RT group sessions 2-3x/week.   [address], LRT, CTRS 09/05/2022 3:22 PM

## 2022-09-05 NOTE — Group Note (Signed)
Date:  09/05/2022 Time:  10:28 AM  Group Topic/Focus:  Goals Group:   The focus of this group is to help patients establish daily goals to achieve during treatment and discuss how the patient can incorporate goal setting into their daily lives to aide in recovery.    Participation Level:  Did Not Attend       [name] 09/05/2022, 10:28 AM

## 2022-09-06 DIAGNOSIS — F250 Schizoaffective disorder, bipolar type: Secondary | ICD-10-CM | POA: Diagnosis not present

## 2022-09-06 NOTE — Progress Notes (Signed)
D- Patient alert and oriented. Irritable. Labile mood.  Denies SI, HI, AVH, and pain. A: Medications administered to patient, per MD orders. Support and encouragement provided.  Routine safety checks conducted every 15 minutes.  Patient informed to notify staff with problems or concerns. R- No adverse drug reactions noted. Patient contracts for safety at this time. Patient compliant with medications and treatment plan. Patient receptive to care. Patient interacts well with others on the unit.  Patient remains safe at this time.
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
## 2022-09-06 NOTE — Group Note (Unsigned)
Date:  09/06/2022 Time:  4:21 PM  Group Topic/Focus:  Outside Rec Therapy     Participation Level:  {BHH PARTICIPATION UEAVW:09811}  Participation Quality:  {BHH PARTICIPATION QUALITY:22265}  Affect:  {BHH AFFECT:22266}  Cognitive:  {BHH COGNITIVE:22267}  Insight: {BHH Insight2:20797}  Engagement in Group:  {BHH ENGAGEMENT IN BJYNW:29562}  Modes of Intervention:  {BHH MODES OF INTERVENTION:22269}  Additional Comments:  ***  [name] 09/06/2022, 4:21 PM

## 2022-09-06 NOTE — Plan of Care (Signed)
?  Problem: Nutrition: ?Goal: Adequate nutrition will be maintained ?Outcome: Progressing ?  ?Problem: Coping: ?Goal: Level of anxiety will decrease ?Outcome: Progressing ?  ?Problem: Elimination: ?Goal: Will not experience complications related to bowel motility ?Outcome: Progressing ?  ?Problem: Safety: ?Goal: Ability to remain free from injury will improve ?Outcome: Progressing ?  ?Problem: Skin Integrity: ?Goal: Risk for impaired skin integrity will decrease ?Outcome: Progressing ?  ?

## 2022-09-06 NOTE — Group Note (Signed)
Date:  09/06/2022 Time:  11:55 PM  Group Topic/Focus:  Wrap-Up Group:   The focus of this group is to help patients review their daily goal of treatment and discuss progress on daily workbooks.    Participation Level:  Minimal  Participation Quality:  Appropriate  Affect:  Appropriate  Cognitive:  Appropriate  Insight: Appropriate  Engagement in Group:  Engaged  Modes of Intervention:  Education  Additional Comments:    Osker Mason 09/06/2022, 11:55 PM

## 2022-09-06 NOTE — Group Note (Unsigned)
Date:  09/06/2022 Time:  3:30 PM  Group Topic/Focus:  Movement Therapy     Participation Level:  {BHH PARTICIPATION ZOXWR:60454}  Participation Quality:  {BHH PARTICIPATION QUALITY:22265}  Affect:  {BHH AFFECT:22266}  Cognitive:  {BHH COGNITIVE:22267}  Insight: {BHH Insight2:20797}  Engagement in Group:  {BHH ENGAGEMENT IN UJWJX:91478}  Modes of Intervention:  {BHH MODES OF INTERVENTION:22269}  Additional Comments:  ***  [name] 09/06/2022, 3:30 PM

## 2022-09-06 NOTE — Plan of Care (Signed)
  Problem: Nutrition: Goal: Adequate nutrition will be maintained Outcome: Progressing   Problem: Coping: Goal: Level of anxiety will decrease Outcome: Progressing   Problem: Safety: Goal: Ability to remain free from injury will improve Outcome: Progressing   Problem: Skin Integrity: Goal: Risk for impaired skin integrity will decrease Outcome: Progressing   

## 2022-09-06 NOTE — Progress Notes (Signed)
Patient present in the dayroom watching TV.   Calm and cooperative this morning.  Flat affect.  Denies SI/HI and AVH.   Preoccupied with speaking to his guardian and discharging.    Compliant with scheduled medications.  15 min checks in place for safety.    1440 Patient appeared to become agitated by internal stimuli  Returned to his room and could be heard arguing with someone not present in the room.  No PRN medication needed.   Patient participated in MHT exercise group.  Also went to courtyard for fresh air.

## 2022-09-06 NOTE — Group Note (Signed)
Date:  09/06/2022 Time:  4:24 PM  Group Topic/Focus:  Outside Rec Therapy    Participation Level:  Active  Participation Quality:  Appropriate  Affect:  Appropriate  Cognitive:  Appropriate  Insight: Appropriate  Engagement in Group:  Engaged  Modes of Intervention:  Discussion  Additional Comments:  None  [name] 09/06/2022, 4:24 PM

## 2022-09-06 NOTE — Group Note (Signed)
Date:  09/06/2022 Time:  3:35 PM  Group Topic/Focus:  Movement therapy    Participation Level:  Active  Participation Quality:  Appropriate  Affect:  Appropriate  Cognitive:  Appropriate  Insight: Appropriate  Engagement in Group:  Engaged  Modes of Intervention:  Activity  Additional Comments:  None  [name] 09/06/2022, 3:35 PM

## 2022-09-07 DIAGNOSIS — F250 Schizoaffective disorder, bipolar type: Secondary | ICD-10-CM | POA: Diagnosis not present

## 2022-09-07 NOTE — Progress Notes (Signed)
   09/07/22 2300  Psych Admission Type (Psych Patients Only)  Admission Status Involuntary  Psychosocial Assessment  Patient Complaints Other (Comment) (preoccupied on discharge)  Eye Contact Fair  Facial Expression Flat  Affect Flat  Speech Slurred;Pressured  Interaction Childlike  Motor Activity Slow  Appearance/Hygiene In scrubs  Behavior Characteristics Cooperative  Mood Preoccupied  Thought Process  Coherency Flight of ideas  Content Preoccupation  Delusions Grandeur  Perception Hallucinations  Hallucination Auditory  Judgment Limited  Confusion Mild  Danger to Self  Current suicidal ideation? Denies  Self-Injurious Behavior No self-injurious ideation or behavior indicators observed or expressed   Agreement Not to Harm Self Yes  Description of Agreement verbal  Danger to Others  Danger to Others None reported or observed

## 2022-09-07 NOTE — Plan of Care (Signed)
  Problem: Nutrition: Goal: Adequate nutrition will be maintained Outcome: Progressing   Problem: Coping: Goal: Level of anxiety will decrease Outcome: Progressing   Problem: Safety: Goal: Ability to remain free from injury will improve Outcome: Progressing   Problem: Skin Integrity: Goal: Risk for impaired skin integrity will decrease Outcome: Progressing   

## 2022-09-07 NOTE — Group Note (Signed)
LCSW Group Therapy Note   Group Date: 09/07/2022 Start Time: 1315 End Time: 2000   Type of Therapy and Topic:  Group Therapy: Challenging Core Beliefs  Participation Level:  Active  Description of Group:  Patients were educated about core beliefs and asked to identify one harmful core belief that they have. Patients were asked to explore from where those beliefs originate. Patients were asked to discuss how those beliefs make them feel and the resulting behaviors of those beliefs. They were then be asked if those beliefs are true and, if so, what evidence they have to support them. Lastly, group members were challenged to replace those negative core beliefs with helpful beliefs.   Therapeutic Goals:   1. Patient will identify harmful core beliefs and explore the origins of such beliefs. 2. Patient will identify feelings and behaviors that result from those core beliefs. 3. Patient will discuss whether such beliefs are true. 4.  Patient will replace harmful core beliefs with helpful ones.  Summary of Patient Progress:  [name] actively engaged in processing and exploring how core beliefs are formed and how they impact thoughts, feelings, and behaviors. Patient proved open to input from peers and feedback from CSW. Patient demonstrated good insight into the subject matter, was respectful and supportive of peers, and participated throughout the entire session.  Therapeutic Modalities: Cognitive Behavioral Therapy; Solution-Focused Therapy   [name], [place] 09/07/2022  2:35 PM

## 2022-09-07 NOTE — Progress Notes (Signed)
   09/06/22 2300  Psych Admission Type (Psych Patients Only)  Admission Status Involuntary  Psychosocial Assessment  Patient Complaints Anxiety  Eye Contact Fair  Facial Expression Flat  Affect Flat  Speech Pressured  Interaction Childlike  Motor Activity Slow  Appearance/Hygiene In scrubs  Behavior Characteristics Cooperative;Restless  Mood Preoccupied  Thought Chartered certified accountant of ideas  Content Preoccupation  Delusions Grandeur  Perception WDL  Hallucination Auditory  Judgment Limited  Confusion Mild  Danger to Self  Current suicidal ideation? Denies  Self-Injurious Behavior No self-injurious ideation or behavior indicators observed or expressed   Agreement Not to Harm Self Yes  Description of Agreement verbal  Danger to Others  Danger to Others None reported or observed

## 2022-09-07 NOTE — Progress Notes (Signed)
   09/07/22 0600  15 Minute Checks  Location Dayroom  Visual Appearance Calm  Behavior Composed  Sleep (Behavioral Health Patients Only)  Calculate sleep? (Click Yes once per 24 hr at 0600 safety check) Yes  Documented sleep last 24 hours 4
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
## 2022-09-07 NOTE — Progress Notes (Signed)
Patient present in the dayroom for breakfast.  Calm and cooperative this morning.  Denies SI/HI.  Denies anxiety and depression.  Denies pain. Appears to be falling asleep in dayroom.   Compliant with scheduled medications with encouragement from RN. 15 min checks in place for safety.  Present in the milieu. Limited interaction with peers.  Continues to be fixated on discharging next week.

## 2022-09-07 NOTE — Group Note (Signed)
Date:  09/07/2022 Time:  10:23 PM  Group Topic/Focus:  Goals Group:   The focus of this group is to help patients establish daily goals to achieve during treatment and discuss how the patient can incorporate goal setting into their daily lives to aide in recovery.    Participation Level:  Active  Participation Quality:  Appropriate  Affect:  Appropriate  Cognitive:  Appropriate  Insight: Appropriate  Engagement in Group:  Engaged  Modes of Intervention:  Discussion  Additional Comments:    [name] 09/07/2022, 10:23 PM

## 2022-09-08 DIAGNOSIS — F250 Schizoaffective disorder, bipolar type: Secondary | ICD-10-CM | POA: Diagnosis not present

## 2022-09-08 NOTE — Progress Notes (Signed)
Pt denies SI/HI and verbally agrees to approach staff if these become apparent or before harming themselves/others. Pt with flat affect, but resting most of this RN shift. When conversing with staff, pt calm, cooperative and responding appropriately to questions. Rates pain 0/10.  Denies depression or anxiety to this RN.  Scheduled medications administered to pt, per MD orders. RN provided support and encouragement to pt. Q15 min safety checks implemented and continued. Pt is safe on the unit. Plan of care on going and no other concerns expressed at this time.

## 2022-09-08 NOTE — Group Note (Signed)
Date:  09/08/2022 Time:  11:36 PM  Group Topic/Focus:  Developing a Wellness Toolbox:   The focus of this group is to help patients develop a "wellness toolbox" with skills and strategies to promote recovery upon discharge.    Participation Level:  Active  Participation Quality:  Appropriate  Affect:  Appropriate  Cognitive:  Oriented  Insight: Good  Engagement in Group:  Engaged  Modes of Intervention:  Discussion  Additional Comments:    [name] 09/08/2022, 11:36 PM

## 2022-09-08 NOTE — Progress Notes (Signed)
   09/08/22 8657  15 Minute Checks  Location Dayroom  Visual Appearance Calm  Behavior Composed  Sleep (Behavioral Health Patients Only)  Calculate sleep? (Click Yes once per 24 hr at 0600 safety check) Yes  Documented sleep last 24 hours 7.5
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
## 2022-09-08 NOTE — Progress Notes (Signed)
D- Patient alert and orientated to person and place. Affect/mood.  Restless and pacing at times. Denies SI, HI, AVH, and pain. Quotes. Goal.  Confused A- Scheduled medications administered to patient, per MD orders. Support and encouragement provided.  Routine safety checks conducted every 15 minutes.  Patient informed to notify staff with problems or concerns. R- No adverse drug reactions noted. Patient contracts for safety at this time. Patient compliant with medications and treatment plan. Patient receptive, calm, and cooperative. Patient interacts well with others on the unit.  Patient remains safe at this time.

## 2022-09-09 DIAGNOSIS — F250 Schizoaffective disorder, bipolar type: Secondary | ICD-10-CM | POA: Diagnosis not present

## 2022-09-09 LAB — CARBAMAZEPINE LEVEL, TOTAL: Carbamazepine Lvl: 9.7 ug/mL (ref 4.0–12.0)

## 2022-09-09 NOTE — Progress Notes (Signed)
   09/09/22 0708  Psych Admission Type (Psych Patients Only)  Admission Status Involuntary  Psychosocial Assessment  Patient Complaints None  Eye Contact Fair  Facial Expression Flat  Affect Sullen  Speech Slurred  Interaction Assertive  Motor Activity Slow  Appearance/Hygiene In scrubs  Behavior Characteristics Cooperative  Mood Anxious  Thought Process  Coherency WDL  Content WDL  Delusions None reported or observed  Perception WDL  Hallucination None reported or observed  Judgment Impaired  Confusion Mild  Danger to Self  Current suicidal ideation? Denies

## 2022-09-09 NOTE — Group Note (Signed)
Date:  09/09/2022 Time:  11:00 AM  Group Topic/Focus:  Wellness Toolbox:   The focus of this group is to discuss various aspects of wellness, balancing those aspects and exploring ways to increase the ability to experience wellness.  Patients will create a wellness toolbox for use upon discharge.    Participation Level:  Minimal  Participation Quality:  Appropriate  Affect:  Appropriate  Cognitive:  Appropriate  Insight: Appropriate  Engagement in Group:  Engaged  Modes of Intervention:  Discussion, Education, Socialization, and Support  Additional Comments:    Wilford Corner 09/09/2022, 11:00 AM
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
## 2022-09-09 NOTE — Plan of Care (Signed)
  Problem: Education: Goal: Knowledge of General Education information will improve Description: Including pain rating scale, medication(s)/side effects and non-pharmacologic comfort measures Outcome: Progressing   Problem: Health Behavior/Discharge Planning: Goal: Ability to manage health-related needs will improve Outcome: Progressing   Problem: Clinical Measurements: Goal: Ability to maintain clinical measurements within normal limits will improve Outcome: Progressing Goal: Cardiovascular complication will be avoided Outcome: Progressing   Problem: Nutrition: Goal: Adequate nutrition will be maintained Outcome: Progressing   Problem: Coping: Goal: Level of anxiety will decrease Outcome: Progressing

## 2022-09-09 NOTE — BHH Counselor (Signed)
CSW faxed over medication list to [name] per her request ( fax: [phone]   [name], MSW, [facility] 09/09/2022 4:16 PM

## 2022-09-09 NOTE — Group Note (Signed)
Date:  09/09/2022 Time:  9:15 AM  Group Topic/Focus:  Making Healthy Choices:   The focus of this group is to help patients identify negative/unhealthy choices they were using prior to admission and identify positive/healthier coping strategies to replace them upon discharge.    Participation Level:  Active  Participation Quality:  Appropriate  Affect:  Appropriate  Cognitive:  Appropriate  Insight: Appropriate  Engagement in Group:  Engaged  Modes of Intervention:  Discussion  Additional Comments:  none  [name] 09/09/2022, 9:15 AM

## 2022-09-09 NOTE — BHH Counselor (Signed)
CSW received return call from [facility].   CSW relayed to [name] that pt will be likely to discharge in the next few days.   CSW stated that in order for pt to go to group home [name] stated that he must participate in day program and get medication management from [facility] [employer].   [name] endorsed understanding.   CSW urged [name] to call her back once she reached out to [facility] and confirmed pt's bed.    [name], MSW, [place] 09/09/2022 11:16 AM

## 2022-09-09 NOTE — Group Note (Signed)
LCSW Group Therapy Note  Group Date: 09/09/2022 Start Time: 1330 End Time: 1400   Type of Therapy and Topic:  Group Therapy - Healthy vs Unhealthy Coping Skills  Participation Level:  Active   Description of Group The focus of this group was to determine what unhealthy coping techniques typically are used by group members and what healthy coping techniques would be helpful in coping with various problems. Patients were guided in becoming aware of the differences between healthy and unhealthy coping techniques. Patients were asked to identify 2-3 healthy coping skills they would like to learn to use more effectively.  Therapeutic Goals Patients learned that coping is what human beings do all day long to deal with various situations in their lives Patients defined and discussed healthy vs unhealthy coping techniques Patients identified their preferred coping techniques and identified whether these were healthy or unhealthy Patients determined 2-3 healthy coping skills they would like to become more familiar with and use more often. Patients provided support and ideas to each other   Summary of Patient Progress:   Patient proved open to input from peers and feedback from CSW. Patient demonstrated good insight into the subject matter, was respectful of peers, and participated throughout the entire session.   Therapeutic Modalities Cognitive Behavioral Therapy Motivational Interviewing  [name], [place] 09/09/2022  2:05 PM

## 2022-09-09 NOTE — Group Note (Unsigned)
Date:  09/09/2022 Time:  9:10 AM  Group Topic/Focus:  Making Healthy Choices:   The focus of this group is to help patients identify negative/unhealthy choices they were using prior to admission and identify positive/healthier coping strategies to replace them upon discharge.     Participation Level:  {BHH PARTICIPATION QMVHQ:46962}  Participation Quality:  {BHH PARTICIPATION QUALITY:22265}  Affect:  {BHH AFFECT:22266}  Cognitive:  {BHH COGNITIVE:22267}  Insight: {BHH Insight2:20797}  Engagement in Group:  {BHH ENGAGEMENT IN XBMWU:13244}  Modes of Intervention:  {BHH MODES OF INTERVENTION:22269}  Additional Comments:  ***  [name] 09/09/2022, 9:10 AM

## 2022-09-09 NOTE — Progress Notes (Signed)
   09/09/22 2200  Psych Admission Type (Psych Patients Only)  Admission Status Involuntary  Psychosocial Assessment  Patient Complaints None  Eye Contact Fair  Facial Expression Flat  Affect Flat  Speech Slurred;Soft  Interaction Assertive  Motor Activity Slow  Appearance/Hygiene In scrubs  Behavior Characteristics Cooperative  Mood Anxious  Thought Process  Coherency WDL  Content WDL  Delusions None reported or observed  Perception WDL  Hallucination None reported or observed  Judgment Impaired  Confusion Mild  Danger to Self  Current suicidal ideation? Denies  Self-Injurious Behavior No self-injurious ideation or behavior indicators observed or expressed   Agreement Not to Harm Self Yes  Description of Agreement verbal  Danger to Others  Danger to Others None reported or observed

## 2022-09-09 NOTE — Group Note (Signed)
Recreation Therapy Group Note   Group Topic:General Recreation  Group Date: 09/09/2022 Start Time: 1400 End Time: 1450 Facilitators: Rosina Lowenstein, LRT, CTRS Location: Courtyard  Group Description: Outdoor Recreation. Patients had the option to play ring toss, corn hole or sit and listen to music while outside in the courtyard getting fresh air and sunlight. LRT and pts discussed things that they enjoy doing in their free time outside of the hospital.   Goal Area(s) Addressed: Patient will identify leisure interests.  Patient will practice healthy decision making. Patient will engage in recreation activity.   Affect/Mood: Appropriate   Participation Level: Active and Engaged   Participation Quality: Independent   Behavior: Appropriate, Calm, and Cooperative   Speech/Thought Process: Coherent   Insight: Good   Judgement: Good   Modes of Intervention: Activity   Patient Response to Interventions:  Attentive, Engaged, Interested , and Receptive   Education Outcome:  Acknowledges education   Clinical Observations/Individualized Feedback: [place] was active in their participation of session activities and group discussion. Pt shared "it was so cold outside this morning, I thought it was gonna snow!" Pt had a bright affect when interacting with others. Pt played and won many games of corn hole. Pt interacted well with LRT and peers duration of session.   Plan: Continue to engage patient in RT group sessions 2-3x/week.   Rosina Lowenstein, LRT, CTRS 09/09/2022 3:19 PM

## 2022-09-09 NOTE — BHH Counselor (Signed)
CSW contacted [name], group home supervisors  [name] states he is willing to take pt to group home.   [name] states he would need a call from guardian in order to coordinate.   CSW contacted [facility], unable to reach, LVM requesting return call.    [name], MSW, [place] 09/09/2022 9:58 AM

## 2022-09-10 DIAGNOSIS — F250 Schizoaffective disorder, bipolar type: Secondary | ICD-10-CM | POA: Diagnosis not present

## 2022-09-10 NOTE — Plan of Care (Signed)
  Problem: Education: Goal: Knowledge of General Education information will improve Description: Including pain rating scale, medication(s)/side effects and non-pharmacologic comfort measures Outcome: Progressing   Problem: Health Behavior/Discharge Planning: Goal: Ability to manage health-related needs will improve Outcome: Progressing   Problem: Clinical Measurements: Goal: Ability to maintain clinical measurements within normal limits will improve Outcome: Progressing Goal: Cardiovascular complication will be avoided Outcome: Progressing   Problem: Nutrition: Goal: Adequate nutrition will be maintained Outcome: Progressing   Problem: Coping: Goal: Level of anxiety will decrease Outcome: Progressing   Problem: Elimination: Goal: Will not experience complications related to bowel motility Outcome: Progressing

## 2022-09-10 NOTE — BH IP Treatment Plan (Signed)
Interdisciplinary Treatment and Diagnostic Plan Update  09/10/2022 Time of Session: 9:00 AM  [name] MRN: [id number]  Principal Diagnosis: Schizoaffective disorder, bipolar type (HCC)  Secondary Diagnoses: Principal Problem:   Schizoaffective disorder, bipolar type (HCC)   Current Medications:  Current Facility-Administered Medications  Medication Dose Route Frequency Provider Last Rate Last Admin   amLODipine (NORVASC) tablet 10 mg  10 mg Oral Daily Sarina Ill, DO   10 mg at 09/08/22 1022   atorvastatin (LIPITOR) tablet 40 mg  40 mg Oral QHS Ardis Hughs, NP   40 mg at 09/09/22 2122   carbamazepine (TEGRETOL XR) 12 hr tablet 200 mg  200 mg Oral BID Sarina Ill, DO   200 mg at 09/10/22 4098   chlorproMAZINE (THORAZINE) tablet 200 mg  200 mg Oral QHS Sarina Ill, DO   200 mg at 09/09/22 2122   cloNIDine (CATAPRES) tablet 0.1 mg  0.1 mg Oral Q8H PRN Sarina Ill, DO       diphenhydrAMINE (BENADRYL) capsule 50 mg  50 mg Oral BH-q8a4p Sarina Ill, DO   50 mg at 09/10/22 1191   fluPHENAZine (PROLIXIN) tablet 5 mg  5 mg Oral BH-q8a4p Sarina Ill, DO   5 mg at 09/10/22 0951   fluticasone furoate-vilanterol (BREO ELLIPTA) 200-25 MCG/ACT 1 puff  1 puff Inhalation Daily Ardis Hughs, NP   1 puff at 09/08/22 0830   LORazepam (ATIVAN) injection 2 mg  2 mg Intramuscular Q4H PRN Sarina Ill, DO   2 mg at 09/03/22 1055   Or   LORazepam (ATIVAN) tablet 2 mg  2 mg Oral Q4H PRN Sarina Ill, DO   2 mg at 09/03/22 2038   losartan (COZAAR) tablet 100 mg  100 mg Oral Daily Ardis Hughs, NP   100 mg at 09/08/22 1022   OLANZapine (ZYPREXA) tablet 10 mg  10 mg Oral Q6H PRN Sarina Ill, DO   10 mg at 09/03/22 4782   Or   OLANZapine (ZYPREXA) injection 10 mg  10 mg Intramuscular Q6H PRN Sarina Ill, DO   10 mg at 09/03/22 0334   traZODone (DESYREL) tablet 100 mg  100 mg  Oral QHS Sarina Ill, DO   100 mg at 09/09/22 2122   PTA Medications: Medications Prior to Admission  Medication Sig Dispense Refill Last Dose   amantadine (SYMMETREL) 100 MG capsule Take 1 capsule (100 mg total) by mouth 2 (two) times daily. 60 capsule 3    amLODipine (NORVASC) 5 MG tablet Take 1 tablet (5 mg total) by mouth daily. 30 tablet 1    atorvastatin (LIPITOR) 40 MG tablet Take 1 tablet (40 mg total) by mouth at bedtime. 30 tablet 1    carbamazepine (TEGRETOL XR) 400 MG 12 hr tablet Take 1 tablet (400 mg total) by mouth at bedtime. 30 tablet 3    divalproex (DEPAKOTE) 250 MG DR tablet Take 250 mg by mouth 3 (three) times daily.      fluticasone furoate-vilanterol (BREO ELLIPTA) 200-25 MCG/ACT AEPB Inhale 1 puff into the lungs daily. 1 each 1    losartan (COZAAR) 100 MG tablet Take 1 tablet (100 mg total) by mouth daily. 30 tablet 3    Melatonin 10 MG TABS Take 10 mg by mouth at bedtime.      QUEtiapine (SEROQUEL) 25 MG tablet Take 25 mg by mouth at bedtime.      risperiDONE (RISPERDAL) 3 MG tablet Take 1 tablet (  3 mg total) by mouth 2 (two) times daily at 8 am and 4 pm. 60 tablet 3    traZODone (DESYREL) 100 MG tablet Take 1 tablet (100 mg total) by mouth at bedtime as needed for sleep. (Patient not taking: Reported on 08/06/2022) 30 tablet 3     Patient Stressors: Health problems   Medication change or noncompliance    Patient Strengths: Active sense of humor  Motivation for treatment/growth   Treatment Modalities: Medication Management, Group therapy, Case management,  1 to 1 session with clinician, Psychoeducation, Recreational therapy.   Physician Treatment Plan for Primary Diagnosis: Schizoaffective disorder, bipolar type (HCC) Long Term Goal(s): Improvement in symptoms so as ready for discharge   Short Term Goals: Ability to identify changes in lifestyle to reduce recurrence of condition will improve Ability to verbalize feelings will improve Ability to  disclose and discuss suicidal ideas Ability to demonstrate self-control will improve Ability to identify and develop effective coping behaviors will improve Ability to maintain clinical measurements within normal limits will improve Compliance with prescribed medications will improve Ability to identify triggers associated with substance abuse/mental health issues will improve  Medication Management: Evaluate patient's response, side effects, and tolerance of medication regimen.  Therapeutic Interventions: 1 to 1 sessions, Unit Group sessions and Medication administration.  Evaluation of Outcomes: Progressing  Physician Treatment Plan for Secondary Diagnosis: Principal Problem:   Schizoaffective disorder, bipolar type (HCC)  Long Term Goal(s): Improvement in symptoms so as ready for discharge   Short Term Goals: Ability to identify changes in lifestyle to reduce recurrence of condition will improve Ability to verbalize feelings will improve Ability to disclose and discuss suicidal ideas Ability to demonstrate self-control will improve Ability to identify and develop effective coping behaviors will improve Ability to maintain clinical measurements within normal limits will improve Compliance with prescribed medications will improve Ability to identify triggers associated with substance abuse/mental health issues will improve     Medication Management: Evaluate patient's response, side effects, and tolerance of medication regimen.  Therapeutic Interventions: 1 to 1 sessions, Unit Group sessions and Medication administration.  Evaluation of Outcomes: Progressing   RN Treatment Plan for Primary Diagnosis: Schizoaffective disorder, bipolar type (HCC) Long Term Goal(s): Knowledge of disease and therapeutic regimen to maintain health will improve  Short Term Goals: Ability to remain free from injury will improve, Ability to verbalize frustration and anger appropriately will improve,  Ability to demonstrate self-control, Ability to participate in decision making will improve, Ability to verbalize feelings will improve, Ability to disclose and discuss suicidal ideas, Ability to identify and develop effective coping behaviors will improve, and Compliance with prescribed medications will improve  Medication Management: RN will administer medications as ordered by provider, will assess and evaluate patient's response and provide education to patient for prescribed medication. RN will report any adverse and/or side effects to prescribing provider.  Therapeutic Interventions: 1 on 1 counseling sessions, Psychoeducation, Medication administration, Evaluate responses to treatment, Monitor vital signs and CBGs as ordered, Perform/monitor CIWA, COWS, AIMS and Fall Risk screenings as ordered, Perform wound care treatments as ordered.  Evaluation of Outcomes: Progressing   LCSW Treatment Plan for Primary Diagnosis: Schizoaffective disorder, bipolar type (HCC) Long Term Goal(s): Safe transition to appropriate next level of care at discharge, Engage patient in therapeutic group addressing interpersonal concerns.  Short Term Goals: Engage patient in aftercare planning with referrals and resources, Increase social support, Increase ability to appropriately verbalize feelings, Increase emotional regulation, Facilitate acceptance of mental health diagnosis and  concerns, Facilitate patient progression through stages of change regarding substance use diagnoses and concerns, Identify triggers associated with mental health/substance abuse issues, and Increase skills for wellness and recovery  Therapeutic Interventions: Assess for all discharge needs, 1 to 1 time with Social worker, Explore available resources and support systems, Assess for adequacy in community support network, Educate family and significant other(s) on suicide prevention, Complete Psychosocial Assessment, Interpersonal group  therapy.  Evaluation of Outcomes: Progressing   Progress in Treatment: Attending groups: Yes. Participating in groups: Yes. Taking medication as prescribed: Yes. Toleration medication: Yes. Family/Significant other contact made: Yes, individual(s) contacted:  [name], pt's guardian, and [name], pt's friend  Patient understands diagnosis: Yes. Discussing patient identified problems/goals with staff: Yes. Medical problems stabilized or resolved: Yes. Denies suicidal/homicidal ideation: Yes. Issues/concerns per patient self-inventory: No. Other: None   New problem(s) identified: No, Describe:  none   New Short Term/Long Term Goal(s): elimination of symptoms of psychosis, medication management for mood stabilization; elimination of SI thoughts; development of comprehensive mental wellness plan. Update 08/21/22: No changes at this time Update 08/26/22: No changes at this time Update 08/31/22: None at this time. Update 09/05/22: No changes at this time Update 09/10/22: No changes at this time   Patient Goals:  Pt unable to participate in treatment team due to active psychosis. Treatment team documents were instead sent over to legal guardian at [facility] Update 08/21/22: No changes at this time Update 08/26/22: [facility] guardianship currently working with CSW on placement options for pt. Pt remains agitated and tangential in thought and speech Update 08/31/22: None at this time. Update 09/05/22: No changes at this time Update 09/10/22: No changes at this time   Discharge Plan or Barriers:  CSW to assist with appropriate discharge planning Update 08/21/22: No changes at this time  Update 08/26/22: Pt has been referred to multiple housing options by guardian, but unable to be accepted due to agitated behavior and concerns regarding pt's behavior from the facilities Update 08/31/22: None at this time. Update 09/05/22: No changes at this time Update 09/10/22: Pt's medications have been  changed. Pt is less agitated and closer to baseline. [name] states he has group home placement for pt. Pt should be ready for discharge soon according to provider    Reason for Continuation of Hospitalization: Delusions  Medication stabilization   Estimated Length of Stay: 1 to 7 daysUpdate 08/21/22: No changes at this time Update 08/26/22: No changes at this time Update 08/31/22: None at this time. Update 09/05/22: No changes at this time Update 09/10/22: No changes at this time    Last 3 [place] Suicide Severity Risk Score: Flowsheet Row Admission (Current) from 08/10/2022 in [facility] ED from 08/06/2022 in [facility] Department at [facility] Admission (Discharged) from 05/06/2022 in [facility] BE[facility]AL MED[facility]INE  C-SSRS RISK CATEGORY No Risk No Risk No Risk       Last PHQ 2/9 Scores:     No data to display          Scribe for Treatment Team: Laretta Alstrom 09/10/2022 12:37 PM

## 2022-09-10 NOTE — Group Note (Signed)
Date:  09/10/2022 Time:  2:18 PM  Group Topic/Focus:  Building Self Esteem:   The Focus of this group is helping patients become aware of the effects of self-esteem on their lives, the things they and others do that enhance or undermine their self-esteem, seeing the relationship between their level of self-esteem and the choices they make and learning ways to enhance self-esteem. Self Care:   The focus of this group is to help patients understand the importance of self-care in order to improve or restore emotional, physical, spiritual, interpersonal, and financial health.    Participation Level:  Active  Participation Quality:  Appropriate, Attentive, Sharing, and Supportive  Affect:  Appropriate  Cognitive:  Alert, Appropriate, and Confused  Insight: Appropriate, Good, and Improving  Engagement in Group:  Supportive  Modes of Intervention:  Activity  Additional Comments:     [name] 09/10/2022, 2:18 PM

## 2022-09-10 NOTE — Progress Notes (Signed)
   09/10/22 0900  Psych Admission Type (Psych Patients Only)  Admission Status Involuntary  Psychosocial Assessment  Patient Complaints None  Eye Contact Fair  Facial Expression Flat  Affect Sullen  Speech Slurred  Interaction Assertive  Motor Activity Slow  Appearance/Hygiene In scrubs  Behavior Characteristics Cooperative;Anxious  Mood Anxious  Thought Process  Coherency WDL  Content Preoccupation  Delusions None reported or observed  Perception WDL  Hallucination None reported or observed  Judgment Impaired  Confusion Mild  Danger to Self  Current suicidal ideation? Denies
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
## 2022-09-10 NOTE — BHH Counselor (Signed)
CSW received call from [name] [phone]).   Amelia looped in [place] to call.    CSW inquired about date [name] would be able to pick pt up. [name] states Tuesday 9/10 would be a good day for him to pick pt up to take him to group home in [facility] on [address]   [name] stated pt needs an appointment with [facility] and that he needs an FL2 emailed to him.   [name] stated that once [name] picks pt, then she can let rep payee know that he is in group home with [facility].   CSW to follow-up with [name] and [name] on Monday to confirm time of pick-up.    [name], MSW, [place] 09/11/2022 11:33 AM

## 2022-09-10 NOTE — Group Note (Signed)
Date:  09/10/2022 Time:  2:37 AM  Group Topic/Focus:  Goals Group:   The focus of this group is to help patients establish daily goals to achieve during treatment and discuss how the patient can incorporate goal setting into their daily lives to aide in recovery.    Participation Level:  Minimal  Participation Quality:  Appropriate  Affect:  Appropriate  Cognitive:  Appropriate  Insight: Appropriate  Engagement in Group:  Developing/Improving  Modes of Intervention:  Education  Additional Comments:    [name] 09/10/2022, 2:37 AM

## 2022-09-10 NOTE — Progress Notes (Signed)
   09/10/22 0601  15 Minute Checks  Location Bedroom  Visual Appearance Calm  Behavior Sleeping  Sleep (Behavioral Health Patients Only)  Calculate sleep? (Click Yes once per 24 hr at 0600 safety check) Yes  Documented sleep last 24 hours 9.25

## 2022-09-10 NOTE — Group Note (Signed)
Recreation Therapy Group Note   Group Topic:Healthy Support Systems  Group Date: 09/10/2022 Start Time: 1400 End Time: 1455 Facilitators: Rosina Lowenstein, LRT, CTRS Location: Courtyard  Group Description: Emotional Check in. Patient sat and talked with LRT about how they are doing and whatever else is on their mind. LRT provided active listening, reassurance and encouragement. Pts were given the opportunity to listen to music or play cornhole while getting fresh air and sunlight in the courtyard.    Goal Area(s) Addressed: Patient will engage in conversation with LRT. Patient will communicate their wants, needs, or questions.  Patient will practice a new coping skill of "talking to someone".   Affect/Mood: Appropriate   Participation Level: Hyperverbal   Participation Quality: Independent   Behavior: Appropriate and Cooperative   Speech/Thought Process: Loose association   Insight: Limited   Judgement: Fair    Modes of Intervention: Activity   Patient Response to Interventions:  Receptive   Education Outcome:  Acknowledges education   Clinical Observations/Individualized Feedback: [name] was active in their participation of session activities and group discussion. Pt chose to play games of corn hole while outside. Pt was observed to be talking to himself and out loud to peers. Pt did not make much sense when speaking. Pt was pleasant and has a bright affect.    Plan: Continue to engage patient in RT group sessions 2-3x/week.   Rosina Lowenstein, LRT, CTRS 09/10/2022 3:33 PM

## 2022-09-11 DIAGNOSIS — F250 Schizoaffective disorder, bipolar type: Secondary | ICD-10-CM | POA: Diagnosis not present

## 2022-09-11 NOTE — BHH Counselor (Signed)
CSW sent FL2 to [name] per his request, via email.   [name], MSW, [place] 09/11/2022 3:48 PM
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
## 2022-09-11 NOTE — Group Note (Signed)
Date:  09/11/2022 Time:  6:34 PM  Group Topic/Focus:  Activity Group:  The focus of the group is to encourage patients to go outside and get some fresh air and some exercise as well.    Participation Level:  Active  Participation Quality:  Appropriate  Affect:  Appropriate  Cognitive:  Appropriate  Insight: Appropriate  Engagement in Group:  Engaged  Modes of Intervention:  Activity  Additional Comments:    [name] 09/11/2022, 6:34 PM

## 2022-09-11 NOTE — Progress Notes (Signed)
   09/11/22 0600  15 Minute Checks  Location Bedroom  Visual Appearance Calm  Behavior Sleeping  Sleep (Behavioral Health Patients Only)  Calculate sleep? (Click Yes once per 24 hr at 0600 safety check) Yes  Documented sleep last 24 hours 6.5

## 2022-09-11 NOTE — Group Note (Signed)
Recreation Therapy Group Note   Group Topic:Emotion Expression  Group Date: 09/11/2022 Start Time: 1400 End Time: 1440 Facilitators: Rosina Lowenstein, LRT, CTRS Location: Courtyard  Group Description: Music Reminisce. LRT encouraged patients to think of their favorite song(s) that reminded them of a positive memory or time in their life. LRT encouraged patient to talk about that memory aloud to the group. LRT played the song through a speaker for all to hear. LRT and patients discussed how thinking of a positive memory or time in their life can be used as a coping skill in everyday life post discharge.    Goal Area(s) Addressed: Patient will increase verbal communication by conversing with peers. Patient will contribute to group discussion with minimal prompting. Patient will reminisce a positive memory or moment in their life.    Affect/Mood: Appropriate   Participation Level: Active and Engaged   Participation Quality: Independent   Behavior: Appropriate, Calm, and Cooperative   Speech/Thought Process: Loose association   Insight: Fair   Judgement: Good   Modes of Intervention: Guided Discussion and Music   Patient Response to Interventions:  Attentive, Engaged, and Receptive   Education Outcome:  Acknowledges education   Clinical Observations/Individualized Feedback: [name] was active in their participation of session activities and group discussion. Pt identified "Might[employer] by [employer]. If you played this growing up, you were cool. Reminds me of a park party." Pt interacted well with LRT and peers duration of session.   Plan: Continue to engage patient in RT group sessions 2-3x/week.   Rosina Lowenstein, LRT, CTRS 09/11/2022 3:26 PM

## 2022-09-11 NOTE — Progress Notes (Signed)
   09/10/22 2300  Psych Admission Type (Psych Patients Only)  Admission Status Involuntary  Psychosocial Assessment  Patient Complaints None  Eye Contact Fair  Facial Expression Flat  Affect Sullen  Speech Slurred  Interaction Assertive  Motor Activity Slow  Appearance/Hygiene In scrubs  Behavior Characteristics Cooperative  Mood Pleasant  Thought Process  Coherency WDL  Content Preoccupation  Delusions None reported or observed  Perception WDL  Hallucination None reported or observed  Judgment Impaired  Confusion Mild  Danger to Self  Current suicidal ideation? Denies  Self-Injurious Behavior No self-injurious ideation or behavior indicators observed or expressed   Agreement Not to Harm Self Yes  Description of Agreement verbal  Danger to Others  Danger to Others None reported or observed

## 2022-09-11 NOTE — Progress Notes (Signed)
Patient cooperative.  Present in the dayroom for breakfast.  Denies SI/HI. Denies feelings of anxiety and depression. Denies pain.  Compliant with scheduled medications.  15 min checks in place for safety.    Present in the milieu. Minimal interaction with peers.  No behavioral issues. Participated in SW group.

## 2022-09-11 NOTE — Group Note (Signed)
[facility] LCSW Group Therapy Note   Group Date: 09/11/2022 Start Time: 1315 End Time: 1400  Type of Therapy/Topic:  Group Therapy:  Feelings about Diagnosis  Participation Level:  Active   Mood: Calm    Description of Group:    This group will allow patients to explore their thoughts and feelings about diagnoses they have received. Patients will be guided to explore their level of understanding and acceptance of these diagnoses. Facilitator will encourage patients to process their thoughts and feelings about the reactions of others to their diagnosis, and will guide patients in identifying ways to discuss their diagnosis with significant others in their lives. This group will be process-oriented, with patients participating in exploration of their own experiences as well as giving and receiving support and challenge from other group members.   Therapeutic Goals: 1. Patient will demonstrate understanding of diagnosis as evidence by identifying two or more symptoms of the disorder:  2. Patient will be able to express two feelings regarding the diagnosis 3. Patient will demonstrate ability to communicate their needs through discussion and/or role plays  Summary of Patient Progress:    Pt was able to discuss feelings around diagnosis appropriately and was able to accept feedback from peers and facilitator.      Therapeutic Modalities:   Cognitive Behavioral Therapy Brief Therapy Feelings Identification    [name], LCSWA

## 2022-09-11 NOTE — Plan of Care (Signed)

## 2022-09-12 DIAGNOSIS — F250 Schizoaffective disorder, bipolar type: Secondary | ICD-10-CM | POA: Diagnosis not present

## 2022-09-12 NOTE — Group Note (Signed)
Recreation Therapy Group Note   Group Topic:Team Building  Group Date: 09/12/2022 Start Time: 1400 End Time: 1445 Facilitators: Rosina Lowenstein, LRT, CTRS Location: Courtyard  Group Description: Apples to Apples. LRT and patients played the card game "Apples to Apples" outside in the courtyard while getting fresh air and sunlight. Light music was being played in the background. LRT facilitated post-game discussion on the importance of working well with others, communicating effectively, active listening to others and being a part of a team. LRT and pts discussed how this can apply to life post-discharge.  Goal Area(s) Addressed: Patient will communicate with peers and LRT. Patient will build on frustration tolerance skills.  Patient will practice active listening skills.   Affect/Mood: Appropriate   Participation Level: Active and Engaged   Participation Quality: Independent   Behavior: Appropriate, Calm, and Cooperative   Speech/Thought Process: Coherent   Insight: Good   Judgement: Good   Modes of Intervention: Competitive Play and Cooperative Play   Patient Response to Interventions:  Attentive, Engaged, Interested , and Receptive   Education Outcome:  Acknowledges education   Clinical Observations/Individualized Feedback: Ashaad was active in their participation of session activities and group discussion. Pt identified "I get to leave on Tuesday to a place in [place]." Pt interacted well with LRT and peers duration of session.   Plan: Continue to engage patient in RT group sessions 2-3x/week.   Rosina Lowenstein, LRT, CTRS 09/12/2022 3:07 PM

## 2022-09-12 NOTE — Plan of Care (Signed)
  Problem: Health Behavior/Discharge Planning: Goal: Ability to manage health-related needs will improve Outcome: Progressing   Problem: Coping: Goal: Level of anxiety will decrease Outcome: Progressing   Problem: Safety: Goal: Ability to remain free from injury will improve Outcome: Progressing   Problem: Skin Integrity: Goal: Risk for impaired skin integrity will decrease Outcome: Progressing   

## 2022-09-12 NOTE — Group Note (Signed)
Date:  09/12/2022 Time:  1:48 PM  Group Topic/Focus:  Making Healthy Choices:   The focus of this group is to help patients identify negative/unhealthy choices they were using prior to admission and identify positive/healthier coping strategies to replace them upon discharge.    Participation Level:  Active  Participation Quality:  Attentive  Affect:  Appropriate  Cognitive:  Appropriate  Insight: Appropriate  Engagement in Group:  Engaged  Modes of Intervention:  Activity  Additional Comments:    Samiha Denapoli 09/12/2022, 1:48 PM

## 2022-09-12 NOTE — Progress Notes (Signed)
Patient happy and dancing in the dayroom.  Pressured speech.  Denies SI/HI.  Denies pain.    Compliant with scheduled medications.  15 min checks in place for safety.  Present in the milieu.  Limited interaction with peers.  Appropriate interaction with staff.  No behavioral issues.
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
## 2022-09-12 NOTE — Progress Notes (Signed)
D- Patient alert and oriented. Angry affect. Pt remains irritable and easily agitated. RIS. Denies SI, HI, and pain.  A- Scheduled medications administered to patient, per MD orders. Support and encouragement provided.  Routine safety checks conducted every 15 minutes.  Patient informed to notify staff with problems or concerns. R- No adverse drug reactions noted. Patient contracts for safety at this time. Patient compliant with medications and treatment plan. Patient receptive to care. Limited interaction with others on the unit.  Patient remains safe at this time.

## 2022-09-12 NOTE — Group Note (Unsigned)
Date:  09/12/2022 Time:  8:47 PM  Group Topic/Focus:  Making Healthy Choices:   The focus of this group is to help patients identify negative/unhealthy choices they were using prior to admission and identify positive/healthier coping strategies to replace them upon discharge.     Participation Level:  {BHH PARTICIPATION BJYNW:29562}  Participation Quality:  {BHH PARTICIPATION QUALITY:22265}  Affect:  {BHH AFFECT:22266}  Cognitive:  {BHH COGNITIVE:22267}  Insight: {BHH Insight2:20797}  Engagement in Group:  {BHH ENGAGEMENT IN ZHYQM:57846}  Modes of Intervention:  {BHH MODES OF INTERVENTION:22269}  Additional Comments:  ***  Burt Ek 09/12/2022, 8:47 PM

## 2022-09-13 DIAGNOSIS — F250 Schizoaffective disorder, bipolar type: Secondary | ICD-10-CM | POA: Diagnosis not present

## 2022-09-13 NOTE — Progress Notes (Signed)
D- Patient alert and oriented. Flat affect. Denies SI, HI, AVH, and pain.  A- Scheduled medications administered to patient, per MD orders. Support and encouragement provided.  Routine safety checks conducted every 15 minutes.  Patient informed to notify staff with problems or concerns. R- No adverse drug reactions noted. Patient contracts for safety at this time. Patient compliant with medications and treatment plan. Patient receptive, calm, and cooperative. Patient remains safe at this time.

## 2022-09-13 NOTE — Progress Notes (Addendum)
   09/13/22 0709  Psych Admission Type (Psych Patients Only)  Admission Status Involuntary  Psychosocial Assessment  Patient Complaints None  Eye Contact Fair  Facial Expression Anxious  Affect Appropriate to circumstance  Speech Loud  Interaction Assertive  Motor Activity Slow  Appearance/Hygiene In scrubs  Behavior Characteristics Cooperative  Mood Pleasant  Thought Process  Coherency Tangential;Loose associations;Disorganized  Content Preoccupation  Delusions Grandeur  Perception Hallucinations  Hallucination Auditory  Judgment Impaired  Confusion Mild  Danger to Self  Current suicidal ideation? Denies  Danger to Others  Danger to Others None reported or observed   Patient is alert and oriented times 3. He has poor insight and judgement. Patient is preoccupied with leaving on Tuesday. He is noted in the dayroom responding to internal stimuli. Also states his name is not Lelton and that he would like to be called [name] or [name].
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
## 2022-09-13 NOTE — Group Note (Signed)
Date:  09/13/2022 Time:  10:57 AM  Group Topic/Focus:  Emotional Education:   The focus of this group is to discuss what feelings/emotions are, and how they are experienced. Spirituality:   The focus of this group is to discuss how one's spirituality can aide in recovery.    Participation Level:  Active  Participation Quality:  Appropriate, Attentive, Sharing, and Supportive  Affect:  Appropriate  Cognitive:  Alert, Appropriate, and Oriented  Insight: Appropriate, Good, and Improving  Engagement in Group:  Engaged, Improving, and Supportive  Modes of Intervention:  Activity and Support  Additional Comments:     [name] 09/13/2022, 10:57 AM

## 2022-09-13 NOTE — Group Note (Signed)
Date:  09/13/2022 Time:  8:29 PM  Group Topic/Focus:  Healthy Communication:   The focus of this group is to discuss communication, barriers to communication, as well as healthy ways to communicate with others.    Participation Level:  Active  Participation Quality:  Appropriate  Affect:  Appropriate  Cognitive:  Appropriate  Insight: Appropriate  Engagement in Group:  Engaged  Modes of Intervention:  Discussion  Additional Comments:    Burt Ek 09/13/2022, 8:29 PM

## 2022-09-14 DIAGNOSIS — F250 Schizoaffective disorder, bipolar type: Secondary | ICD-10-CM | POA: Diagnosis not present
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
## 2022-09-14 NOTE — Group Note (Signed)
LCSW Group Therapy Note   Group Date: 09/14/2022 Start Time: 1320 End Time: 1400   Type of Therapy and Topic:  Group Therapy: 8 Dimensions of Wellness  Participation Level:  Minimal     Summary of Patient Progress:  The patient attended group. Patient proved open to input from peers and feedback from [facility]. The patient was respectful of peers. The patient participated during today's icebreaker questions. The patient stated that he reads the bible and uses prayer as a way to practice spiritual wellness.     Marshell Levan, LCSWA 09/14/2022  3:14 PM

## 2022-09-14 NOTE — BHH Group Notes (Signed)
Patients were given recreation activity of listening to music. Pt attended and was appropriate.

## 2022-09-14 NOTE — Progress Notes (Signed)
D- Patient alert and oriented. Pt agitated. Loud and hollering in his room and hall. Nonsensical speech. Denies SI, HI, AVH, and pain.Marland Kitchen A- Scheduled medications administered to patient, per MD orders. PRN's given for agitation. Support and encouragement provided.  Routine safety checks conducted every 15 minutes.  Patient informed to notify staff with problems or concerns. R- No adverse drug reactions noted. Patient contracts for safety at this time. Patient compliant with medications and treatment plan. Patient receptive to care. Slept well.  Patient remains safe at this time.
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
## 2022-09-14 NOTE — Progress Notes (Signed)
Patient ID: [name], male   DOB: October 17, 1960, 62 y.o.   MRN: [id number] Patient presents with pressured speech, disorganized thoughts. He states '' I want to go swimming, I like the swimming pools and the beach. I am hungry. '' Pt states he is doing '' fine '' and denies any other concerns but speech is at times incomprehensible as it is pressured and tangential and slurred. Asiyah is ambulatory on the unit, attending programming and interactive with peers appropriately today. No behavioral outbursts thus far. Pt is eating and drinking well and denies any physical complaints. Pt is safe, will con't to monitor.

## 2022-09-14 NOTE — [facility] Group Notes (Signed)
[facility] Group Notes:  (Nursing/MHT/Case Management/Adjunct)  Date:  09/14/2022  Time: 1000  Type of Therapy:  Psychoeducational Skills  Participation Level:  disorganized  Participation Quality:  none  Affect:  flat  Cognitive:  lacking  Insight:  lacking  Engagement in Group:  lacking  Modes of Intervention:  Discussion, Education, and Exploration  Summary of Progress/Problems:  Patients were given education on recognizing self sabotaging behaviors, and negative behavioral patterns as it pertains to mental health. Pts were encouraged to share one behavior they would like to change in their mental health . Pts were then given a podcast to listen to on healthy coping skills. Pt attended but did not participate and slept mostly.   Malva Limes 09/14/2022, 12:10 PM

## 2022-09-14 NOTE — Group Note (Signed)
Date:  09/14/2022 Time:  10:24 PM  Group Topic/Focus:  Making Healthy Choices:   The focus of this group is to help patients identify negative/unhealthy choices they were using prior to admission and identify positive/healthier coping strategies to replace them upon discharge.    Participation Level:  Active  Participation Quality:  Appropriate  Affect:  Appropriate  Cognitive:  Appropriate  Insight: Improving  Engagement in Group:  Improving  Modes of Intervention:  Discussion  Additional Comments:    [name] 09/14/2022, 10:24 PM

## 2022-09-14 NOTE — Progress Notes (Signed)
Pt with positive response from prn medications for agitation given, resting quietly in no acute distress with even respirations.

## 2022-09-15 DIAGNOSIS — F250 Schizoaffective disorder, bipolar type: Secondary | ICD-10-CM | POA: Diagnosis not present

## 2022-09-15 NOTE — Group Note (Signed)
Date:  09/15/2022 Time:  10:43 PM  Group Topic/Focus:  Developing a Wellness Toolbox:   The focus of this group is to help patients develop a "wellness toolbox" with skills and strategies to promote recovery upon discharge.    Participation Level:  Did Not Attend  Participation Quality:      Affect:      Cognitive:      Insight: None  Engagement in Group:  None  Modes of Intervention:      Additional Comments:    [name] 09/15/2022, 10:43 PM

## 2022-09-15 NOTE — Progress Notes (Signed)
   09/15/22 0730  Psych Admission Type (Psych Patients Only)  Admission Status Involuntary  Psychosocial Assessment  Patient Complaints None  Eye Contact Fair  Facial Expression Anxious  Affect Preoccupied  Speech Incoherent;Slurred  Interaction Assertive  Motor Activity Slow  Appearance/Hygiene In scrubs  Behavior Characteristics Agitated;Anxious;Fidgety;Impulsive;Irritable  Mood Pleasant  Thought Process  Coherency Tangential;Loose associations;Flight of ideas  Content Preoccupation  Delusions Grandeur  Perception Hallucinations  Hallucination Auditory  Judgment Impaired  Confusion Mild  Danger to Self  Current suicidal ideation? Denies  Danger to Others  Danger to Others None reported or observed

## 2022-09-15 NOTE — [facility] Counselor (Signed)
CSW contacted [name] [phone], unable to reach, unable to LVM.   [name], MSW, [place] 09/15/2022 10:35 AM

## 2022-09-15 NOTE — BH IP Treatment Plan (Signed)
Interdisciplinary Treatment and Diagnostic Plan Update  09/15/2022 Time of Session: 9:00 AM  [name] MRN: [id number]  Principal Diagnosis: Schizoaffective disorder, bipolar type (HCC)  Secondary Diagnoses: Principal Problem:   Schizoaffective disorder, bipolar type (HCC)   Current Medications:  Current Facility-Administered Medications  Medication Dose Route Frequency Provider Last Rate Last Admin   amLODipine (NORVASC) tablet 10 mg  10 mg Oral Daily Sarina Ill, DO   10 mg at 09/14/22 6213   atorvastatin (LIPITOR) tablet 40 mg  40 mg Oral QHS Ardis Hughs, NP   40 mg at 09/14/22 2213   carbamazepine (TEGRETOL XR) 12 hr tablet 200 mg  200 mg Oral BID Sarina Ill, DO   200 mg at 09/15/22 0865   chlorproMAZINE (THORAZINE) tablet 200 mg  200 mg Oral QHS Sarina Ill, DO   200 mg at 09/14/22 2212   cloNIDine (CATAPRES) tablet 0.1 mg  0.1 mg Oral Q8H PRN Sarina Ill, DO       diphenhydrAMINE (BENADRYL) capsule 50 mg  50 mg Oral BH-q8a4p Sarina Ill, DO   50 mg at 09/15/22 0846   fluPHENAZine (PROLIXIN) tablet 5 mg  5 mg Oral BH-q8a4p Sarina Ill, DO   5 mg at 09/15/22 0846   fluticasone furoate-vilanterol (BREO ELLIPTA) 200-25 MCG/ACT 1 puff  1 puff Inhalation Daily Ardis Hughs, NP   1 puff at 09/14/22 0838   losartan (COZAAR) tablet 100 mg  100 mg Oral Daily Ardis Hughs, NP   100 mg at 09/14/22 0833   OLANZapine (ZYPREXA) tablet 10 mg  10 mg Oral Q6H PRN Sarina Ill, DO   10 mg at 09/15/22 0846   Or   OLANZapine (ZYPREXA) injection 10 mg  10 mg Intramuscular Q6H PRN Sarina Ill, DO   10 mg at 09/03/22 0334   traZODone (DESYREL) tablet 100 mg  100 mg Oral QHS Sarina Ill, DO   100 mg at 09/14/22 2214   PTA Medications: Medications Prior to Admission  Medication Sig Dispense Refill Last Dose   amantadine (SYMMETREL) 100 MG capsule Take 1 capsule (100 mg  total) by mouth 2 (two) times daily. 60 capsule 3    amLODipine (NORVASC) 5 MG tablet Take 1 tablet (5 mg total) by mouth daily. 30 tablet 1    atorvastatin (LIPITOR) 40 MG tablet Take 1 tablet (40 mg total) by mouth at bedtime. 30 tablet 1    carbamazepine (TEGRETOL XR) 400 MG 12 hr tablet Take 1 tablet (400 mg total) by mouth at bedtime. 30 tablet 3    divalproex (DEPAKOTE) 250 MG DR tablet Take 250 mg by mouth 3 (three) times daily.      fluticasone furoate-vilanterol (BREO ELLIPTA) 200-25 MCG/ACT AEPB Inhale 1 puff into the lungs daily. 1 each 1    losartan (COZAAR) 100 MG tablet Take 1 tablet (100 mg total) by mouth daily. 30 tablet 3    Melatonin 10 MG TABS Take 10 mg by mouth at bedtime.      QUEtiapine (SEROQUEL) 25 MG tablet Take 25 mg by mouth at bedtime.      risperiDONE (RISPERDAL) 3 MG tablet Take 1 tablet (3 mg total) by mouth 2 (two) times daily at 8 am and 4 pm. 60 tablet 3    traZODone (DESYREL) 100 MG tablet Take 1 tablet (100 mg total) by mouth at bedtime as needed for sleep. (Patient not taking: Reported on 08/06/2022) 30 tablet 3  Patient Stressors: Health problems   Medication change or noncompliance    Patient Strengths: Active sense of humor  Motivation for treatment/growth   Treatment Modalities: Medication Management, Group therapy, Case management,  1 to 1 session with clinician, Psychoeducation, Recreational therapy.   Physician Treatment Plan for Primary Diagnosis: Schizoaffective disorder, bipolar type (HCC) Long Term Goal(s): Improvement in symptoms so as ready for discharge   Short Term Goals: Ability to identify changes in lifestyle to reduce recurrence of condition will improve Ability to verbalize feelings will improve Ability to disclose and discuss suicidal ideas Ability to demonstrate self-control will improve Ability to identify and develop effective coping behaviors will improve Ability to maintain clinical measurements within normal limits  will improve Compliance with prescribed medications will improve Ability to identify triggers associated with substance abuse/mental health issues will improve  Medication Management: Evaluate patient's response, side effects, and tolerance of medication regimen.  Therapeutic Interventions: 1 to 1 sessions, Unit Group sessions and Medication administration.  Evaluation of Outcomes: Progressing  Physician Treatment Plan for Secondary Diagnosis: Principal Problem:   Schizoaffective disorder, bipolar type (HCC)  Long Term Goal(s): Improvement in symptoms so as ready for discharge   Short Term Goals: Ability to identify changes in lifestyle to reduce recurrence of condition will improve Ability to verbalize feelings will improve Ability to disclose and discuss suicidal ideas Ability to demonstrate self-control will improve Ability to identify and develop effective coping behaviors will improve Ability to maintain clinical measurements within normal limits will improve Compliance with prescribed medications will improve Ability to identify triggers associated with substance abuse/mental health issues will improve     Medication Management: Evaluate patient's response, side effects, and tolerance of medication regimen.  Therapeutic Interventions: 1 to 1 sessions, Unit Group sessions and Medication administration.  Evaluation of Outcomes: Progressing   RN Treatment Plan for Primary Diagnosis: Schizoaffective disorder, bipolar type (HCC) Long Term Goal(s): Knowledge of disease and therapeutic regimen to maintain health will improve  Short Term Goals: Ability to remain free from injury will improve, Ability to verbalize frustration and anger appropriately will improve, Ability to demonstrate self-control, Ability to participate in decision making will improve, Ability to verbalize feelings will improve, Ability to disclose and discuss suicidal ideas, Ability to identify and develop effective  coping behaviors will improve, and Compliance with prescribed medications will improve  Medication Management: RN will administer medications as ordered by provider, will assess and evaluate patient's response and provide education to patient for prescribed medication. RN will report any adverse and/or side effects to prescribing provider.  Therapeutic Interventions: 1 on 1 counseling sessions, Psychoeducation, Medication administration, Evaluate responses to treatment, Monitor vital signs and CBGs as ordered, Perform/monitor CIWA, COWS, AIMS and Fall Risk screenings as ordered, Perform wound care treatments as ordered.  Evaluation of Outcomes: Progressing   LCSW Treatment Plan for Primary Diagnosis: Schizoaffective disorder, bipolar type (HCC) Long Term Goal(s): Safe transition to appropriate next level of care at discharge, Engage patient in therapeutic group addressing interpersonal concerns.  Short Term Goals: Engage patient in aftercare planning with referrals and resources, Increase social support, Increase ability to appropriately verbalize feelings, Increase emotional regulation, Facilitate acceptance of mental health diagnosis and concerns, and Increase skills for wellness and recovery  Therapeutic Interventions: Assess for all discharge needs, 1 to 1 time with Social worker, Explore available resources and support systems, Assess for adequacy in community support network, Educate family and significant other(s) on suicide prevention, Complete Psychosocial Assessment, Interpersonal group therapy.  Evaluation of  Outcomes: Progressing   Progress in Treatment: Attending groups: Yes. Participating in groups: Yes. Taking medication as prescribed: Yes. Toleration medication: Yes. Family/Significant other contact made: Yes, individual(s) contacted:  [name], pt's guardian, and [name], pt's friend  Patient understands diagnosis: Yes. Discussing patient identified problems/goals  with staff: Yes. Medical problems stabilized or resolved: Yes. Denies suicidal/homicidal ideation: Yes. Issues/concerns per patient self-inventory: No. Other: None    New problem(s) identified: No, Describe:  none   New Short Term/Long Term Goal(s): elimination of symptoms of psychosis, medication management for mood stabilization; elimination of SI thoughts; development of comprehensive mental wellness plan. Update 08/21/22: No changes at this time Update 08/26/22: No changes at this time Update 08/31/22: None at this time. Update 09/05/22: No changes at this time Update 09/10/22: No changes at this time. Update 09/15/22: No changes at this time     Patient Goals:  Pt unable to participate in treatment team due to active psychosis. Treatment team documents were instead sent over to legal guardian at [facility] Update 08/21/22: No changes at this time Update 08/26/22: [facility] guardianship currently working with CSW on placement options for pt. Pt remains agitated and tangential in thought and speech Update 08/31/22: None at this time. Update 09/05/22: No changes at this time Update 09/10/22: No changes at this timeUpdate 09/15/22: No changes at this time   Discharge Plan or Barriers:  CSW to assist with appropriate discharge planning Update 08/21/22: No changes at this time  Update 08/26/22: Pt has been referred to multiple housing options by guardian, but unable to be accepted due to agitated behavior and concerns regarding pt's behavior from the facilities Update 08/31/22: None at this time. Update 09/05/22: No changes at this time Update 09/10/22: Pt's medications have been changed. Pt is less agitated and closer to baseline. [name] states he has group home placement for pt. Pt should be ready for discharge soon according to provider Update 09/15/22: No changes at this time   Reason for Continuation of Hospitalization: Delusions  Medication stabilization   Estimated Length of Stay: 1 to 7  daysUpdate 08/21/22: No changes at this time Update 08/26/22: No changes at this time Update 08/31/22: None at this time. Update 09/05/22: No changes at this time Update 09/10/22: No changes at this time Update 09/15/22: No changes at this time  Last 3 [place] Suicide Severity Risk Score: Flowsheet Row Admission (Current) from 08/10/2022 in [facility] ED from 08/06/2022 in [facility] Emergency Department at [facility] Admission (Discharged) from 05/06/2022 in [facility]  C-SSRS RISK CATEGORY No Risk No Risk No Risk       Last PHQ 2/9 Scores:     No data to display          Scribe for Treatment Team: [name], Theresia Majors 09/15/2022 4:15 PM

## 2022-09-15 NOTE — Progress Notes (Signed)
   09/15/22 1500  Spiritual Encounters  Type of Visit Initial  Care provided to: Patient  Conversation partners present during encounter Nurse;Social worker/Care management/TOC  Referral source Chaplain assessment  Reason for visit Routine spiritual support  OnCall Visit No  Spiritual Framework  Presenting Themes Meaning/purpose/sources of inspiration;Caregiving needs  Interventions  Spiritual Care Interventions Made Encouragement  Intervention Outcomes  Outcomes Awareness around self/spiritual resourses;Connection to spiritual care;Reduced anxiety;Reduced fear   Patient was at the nurses desk yelling and was very agitated. Social worker was trying to calm the patient down and listened to his grievances. The chaplain asked the patient to sit down and  talk to her. The patient calmed down and said that he was alright now and that he saw the holy spirit. The chaplain listened to the patient and offered a calm presence.
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
## 2022-09-15 NOTE — Group Note (Signed)
Recreation Therapy Group Note   Group Topic:General Recreation  Group Date: 09/15/2022 Start Time: 1400 End Time: 1445 Facilitators: Rosina Lowenstein, LRT, CTRS Location: Courtyard  Group Description: Emotional Check in. Patient sat and talked with LRT about how they are doing and whatever else is on their mind. LRT provided active listening, reassurance and encouragement. Pts were given the opportunity to listen to music or play cornhole while getting fresh air and sunlight in the courtyard.    Goal Area(s) Addressed: Patient will engage in conversation with LRT. Patient will communicate their wants, needs, or questions.  Patient will practice a new coping skill of "talking to someone".   Affect/Mood: Full range   Participation Level: Active and Engaged   Participation Quality: Independent   Behavior: Cooperative   Speech/Thought Process: Loose association   Insight: Fair   Judgement: Fair    Modes of Intervention: Guided Discussion   Patient Response to Interventions:  Receptive   Education Outcome:  Acknowledges education   Clinical Observations/Individualized Feedback: [name] was active in their participation of session activities and group discussion. Pt was outside and pleasant during the first part of group. Pt was pulled by SW for a phone conversation and went inside. Pt did not return to the courtyard.    Plan: Continue to engage patient in RT group sessions 2-3x/week.   Rosina Lowenstein, LRT, CTRS 09/15/2022 3:37 PM

## 2022-09-15 NOTE — Progress Notes (Signed)
D- Patient alert and oriented. Angry affect. Irritable.  Denies SI, HI, AVH, and pain. A- Scheduled medications administered to patient, per MD orders. Support and encouragement provided.  Routine safety checks conducted every 15 minutes.  Patient informed to notify staff with problems or concerns. R- No adverse drug reactions noted. Patient contracts for safety at this time. Patient compliant with medications and treatment plan. Patient receptive to care.  Patient remains safe at this time.

## 2022-09-16 DIAGNOSIS — F250 Schizoaffective disorder, bipolar type: Secondary | ICD-10-CM | POA: Diagnosis not present
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
## 2022-09-16 NOTE — Group Note (Signed)
Date:  09/16/2022 Time:  8:22 PM  Group Topic/Focus:  Wrap-Up Group:   The focus of this group is to help patients review their daily goal of treatment and discuss progress on daily workbooks.    Participation Level:  Did Not Attend  Insight: None  Engagement in Group:  None  Modes of Intervention:  Discussion  Additional Comments:    Osker Mason 09/16/2022, 8:22 PM

## 2022-09-16 NOTE — Progress Notes (Signed)
Pt can be heard in his room arguing and yelling at himself.

## 2022-09-16 NOTE — [facility] Counselor (Signed)
CSW contacted [name] to see if she was able to get in touch with [name] to come up with an alternative plan to discharge pt.   [name] states [name] never contacted her back.  CWS will begin search again for another disposition for pt.   [name], MSW, [place] 09/16/2022 3:07 PM

## 2022-09-16 NOTE — Progress Notes (Signed)
Pt came out of his room and went back in and was cursing and yelling at himself again. Pt given PRN medication for agitation. Will continue to monitor.

## 2022-09-16 NOTE — Progress Notes (Signed)
   09/16/22 1955  Psych Admission Type (Psych Patients Only)  Admission Status Involuntary  Psychosocial Assessment  Patient Complaints None  Eye Contact Brief  Facial Expression Anxious  Affect Preoccupied  Speech Incoherent  Interaction Assertive  Motor Activity Slow  Appearance/Hygiene Unremarkable  Behavior Characteristics Cooperative;Anxious;Agitated  Mood Labile  Thought Process  Coherency Loose associations;Tangential  Content Preoccupation;Blaming others  Delusions Paranoid  Perception Hallucinations  Hallucination Auditory  Judgment Impaired  Confusion Mild  Danger to Self  Current suicidal ideation? Denies  Danger to Others  Danger to Others None reported or observed   Progress note   D: Pt seen in dayroom. Pt denies SI, HI, AVH. Pt rates pain  0/10. Pt rates anxiety  0/10 and depression  0/10. Pt is irritable and speaking incoherently about the situation surrounding not being discharged today. "I don't want to talk about that." Pt is also talking to himself and cursing. Told this Clinical research associate that his name is [name]. "I'm not [name]. My real name is [name]." Pt sitting in dayroom but is preoccupied with his own thoughts. No other concerns noted at this time.  A: Pt provided support and encouragement. Pt given scheduled medication as prescribed. PRNs as appropriate. Q15 min checks for safety.   R: Pt safe on the unit. Will continue to monitor.

## 2022-09-16 NOTE — Group Note (Signed)
Recreation Therapy Group Note   Group Topic:Coping Skills  Group Date: 09/16/2022 Start Time: 1400 End Time: 1455 Facilitators: Rosina Lowenstein, LRT, CTRS Location:  Day Room  Group Description: Chair Yoga. LRT and patients discussed the benefits of yoga and how it differs from strength exercises. LRT educated patients on the mental and physical benefits of yoga and deep breathing and how it can be used as a Associate Professor. LRT and patients followed along to a guided yoga session on the television that focused on all parts of the body, as well as deep breathing. Pt encouraged to stop movement at any time if they feel discomfort or pain.   Goal Area(s) Addressed: Patient will practice using relaxation technique. Patient will identify a new coping skill.  Patient will follow multistep directions to reduce anxiety and stress.   Affect/Mood: N/A   Participation Level: Minimal    Clinical Observations/Individualized Feedback: Shajuan was in and out of the dayroom multiple times. Pt did not complete any of the exercises demonstrated. Pt was heard talking to himself during, however was not loud or disruptive to the group. Pt went outside with LRT and peers after the yoga session. Pt did not talk to LRT or peers duration of session.   Plan: Continue to engage patient in RT group sessions 2-3x/week.   Rosina Lowenstein, LRT, CTRS 09/16/2022 3:18 PM

## 2022-09-16 NOTE — Progress Notes (Signed)
Pt in room arguing with himself. Given nighttime medications less than 1 hour ago. Will continue to monitor for medication effect before considering agitation medication.

## 2022-09-17 DIAGNOSIS — F250 Schizoaffective disorder, bipolar type: Secondary | ICD-10-CM | POA: Diagnosis not present

## 2022-09-17 MED ORDER — HYDROCODONE-ACETAMINOPHEN 7.5-325 MG PO TABS
1.0000 | ORAL_TABLET | Freq: Four times a day (QID) | ORAL | Status: DC | PRN
  Administered 2022-09-17: 1 via ORAL
  Filled 2022-09-17: qty 1

## 2022-09-17 NOTE — Progress Notes (Signed)
Pt is staying in his room but still yelling and cursing. Medication had no apparent effect.

## 2022-09-17 NOTE — Progress Notes (Signed)
Pt awake and sitting in the dayroom talking to himself, No outbursts noted.
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
## 2022-09-17 NOTE — Plan of Care (Signed)
  Problem: Clinical Measurements: °Goal: Ability to maintain clinical measurements within normal limits will improve °Outcome: Progressing °  °Problem: Nutrition: °Goal: Adequate nutrition will be maintained °Outcome: Progressing °  °Problem: Safety: °Goal: Ability to remain free from injury will improve °Outcome: Progressing °  °Problem: Skin Integrity: °Goal: Risk for impaired skin integrity will decrease °Outcome: Progressing °  °

## 2022-09-17 NOTE — Progress Notes (Signed)
   09/17/22 1300  Psych Admission Type (Psych Patients Only)  Admission Status Involuntary  Psychosocial Assessment  Patient Complaints None  Eye Contact Fair  Facial Expression Anxious;Angry;Worried  Affect Preoccupied;Irritable;Anxious  Speech Slurred  Interaction Assertive  Motor Activity Slow  Appearance/Hygiene Layered clothes  Behavior Characteristics Cooperative;Anxious;Agitated;Irritable;Impulsive;Pacing  Mood Labile  Thought Process  Coherency Loose associations;Disorganized;Flight of ideas;Tangential  Content Preoccupation;Paranoia;Blaming others  Delusions Paranoid  Perception Hallucinations;Derealization  Hallucination Auditory;Visual  Judgment Impaired  Confusion Mild  Danger to Self  Current suicidal ideation? Denies  Danger to Others  Danger to Others None reported or observed   Patient continues to be irritable, yelling, and angry intermittently on the unit. Multiple verbal De-escalation. Medicated for agitation as ordered. Complained of toothache, pain medication obtained from provider. Awaiting pharmacy to load pyxis. Denies SI/HI but AVH noted. Will continue to monitor

## 2022-09-17 NOTE — Group Note (Unsigned)
Date:  09/18/2022 Time:  12:22 AM  Group Topic/Focus:  Healthy Communication:   The focus of this group is to discuss communication, barriers to communication, as well as healthy ways to communicate with others.    Participation Level:  Active  Participation Quality:  Appropriate  Affect:  Appropriate  Cognitive:  Appropriate  Insight: Improving  Engagement in Group:  Improving  Modes of Intervention:  Discussion  Additional Comments:    [name] 09/18/2022, 12:22 AM

## 2022-09-17 NOTE — Group Note (Signed)
Recreation Therapy Group Note   Group Topic:Other  Group Date: 09/17/2022 Start Time: 1400 End Time: 1455 Facilitators: Rosina Lowenstein, LRT, CTRS Location:  Craft Room and Courtyard   Group Description: Bingo. LRT and patients played multiple games of Bingo with music playing in the background. LRT and pts discussed how this could be a leisure interest and the importance of doing things they enjoy post-discharge.   Goal Area(s) Addressed: Patient will identify leisure interests.  Patient will practice healthy decision making. Patient will engage in recreation activity.    Affect/Mood: Appropriate   Participation Level: Hyperverbal   Participation Quality: Independent   Behavior: Cooperative   Speech/Thought Process: Flight of ideas and Loose association   Insight: Limited   Judgement: Fair    Modes of Intervention: Activity   Patient Response to Interventions:  Receptive   Education Outcome:  Acknowledges education   Clinical Observations/Individualized Feedback: [name] was somewhat active in their participation of session activities and group discussion. Pt chose not to play bingo, however was present in the craft room. Pt talked to himself nonstop throughout the games. Pt chose to go outside with peers. Pt was singing, dancing, and had a bright affect.    Plan: Continue to engage patient in RT group sessions 2-3x/week.   Rosina Lowenstein, LRT, CTRS 09/17/2022 3:13 PM

## 2022-09-17 NOTE — Progress Notes (Signed)
   09/17/22 0602  15 Minute Checks  Location Bedroom  Visual Appearance Calm  Behavior Composed  Sleep (Behavioral Health Patients Only)  Calculate sleep? (Click Yes once per 24 hr at 0600 safety check) Yes  Documented sleep last 24 hours 5.75
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
## 2022-09-17 NOTE — Group Note (Signed)
[facility] LCSW Group Therapy Note   Group Date: 09/17/2022 Start Time: 1315 End Time: 1400   Type of Therapy/Topic:  Group Therapy:  Emotion Regulation  Participation Level:  Active   Mood:  Description of Group:    The purpose of this group is to assist patients in learning to regulate negative emotions and experience positive emotions. Patients will be guided to discuss ways in which they have been vulnerable to their negative emotions. These vulnerabilities will be juxtaposed with experiences of positive emotions or situations, and patients challenged to use positive emotions to combat negative ones. Special emphasis will be placed on coping with negative emotions in conflict situations, and patients will process healthy conflict resolution skills.  Therapeutic Goals: Patient will identify two positive emotions or experiences to reflect on in order to balance out negative emotions:  Patient will label two or more emotions that they find the most difficult to experience:  Patient will be able to demonstrate positive conflict resolution skills through discussion or role plays:   Summary of Patient Progress:   Pt was unable to regulate emotions properly to participate in group and was agitated     Therapeutic Modalities:   Cognitive Behavioral Therapy Feelings Identification Dialectical Behavioral Therapy   [name], LCSWA

## 2022-09-18 DIAGNOSIS — F250 Schizoaffective disorder, bipolar type: Secondary | ICD-10-CM | POA: Diagnosis not present

## 2022-09-18 MED ORDER — CLONAZEPAM 1 MG PO TABS
2.0000 mg | ORAL_TABLET | Freq: Two times a day (BID) | ORAL | Status: DC
  Administered 2022-09-18 – 2022-09-23 (×9): 2 mg via ORAL
  Filled 2022-09-18 (×11): qty 2

## 2022-09-18 NOTE — Group Note (Signed)
Date:  09/18/2022 Time:  9:16 PM  Group Topic/Focus:  Identifying Needs:   The focus of this group is to help patients identify their personal needs that have been historically problematic and identify healthy behaviors to address their needs.    Participation Level:  Did Not Attend  Participation Quality:   Did Not Attend  Affect:   Did Not Attend  Cognitive:   Did Not Attend  Insight: None  Engagement in Group:  None  Modes of Intervention:   Did Not Attend  Additional Comments:    [name] 09/18/2022, 9:16 PM

## 2022-09-18 NOTE — Progress Notes (Signed)
   09/18/22 1700  Psych Admission Type (Psych Patients Only)  Admission Status Involuntary  Psychosocial Assessment  Patient Complaints None  Eye Contact Fair  Facial Expression Animated;Wide-eyed  Affect Preoccupied;Anxious  Speech Slurred;Pressured  Interaction Assertive  Motor Activity Restless  Appearance/Hygiene Poor hygiene  Behavior Characteristics Cooperative;Anxious;Restless  Mood Preoccupied  Thought Process  Coherency Loose associations;Disorganized  Content Preoccupation;Paranoia  Delusions Grandeur  Perception Derealization;Hallucinations  Hallucination Auditory;Visual  Judgment Impaired  Confusion Mild  Danger to Self  Current suicidal ideation? Denies  Danger to Others  Danger to Others None reported or observed

## 2022-09-18 NOTE — Group Note (Signed)
Recreation Therapy Group Note   Group Topic:Leisure Education  Group Date: 09/18/2022 Start Time: 1400 End Time: 1450 Facilitators: Rosina Lowenstein, LRT, CTRS Location: Courtyard  Group Description: Leisure. Patients were given the opportunity to play ring toss, play corn hole, or listen to music while sitting in the courtyard getting fresh air and sunlight. Pt identified and conversated about things they enjoy doing in their free time and how they can continue to do that outside of the hospital.   Goal Area(s) Addressed: Patient will learn the definition of "leisure". Patient will practice making a positive decision. Patient will have the opportunity to try a new leisure activity. Patient will communicate with peers and LRT.   Affect/Mood: Appropriate   Participation Level: Minimal    Clinical Observations/Individualized Feedback: [name] came outside for maybe 5 minutes before going back inside. Pt did not return. Pt was calm and pleasant while outside.  Plan: Continue to engage patient in RT group sessions 2-3x/week.   Rosina Lowenstein, LRT, CTRS 09/18/2022 3:04 PM

## 2022-09-18 NOTE — Plan of Care (Signed)
  Problem: Nutrition: Goal: Adequate nutrition will be maintained Outcome: Progressing   Problem: Elimination: Goal: Will not experience complications related to bowel motility Outcome: Progressing   Problem: Safety: Goal: Ability to remain free from injury will improve Outcome: Progressing   Problem: Skin Integrity: Goal: Risk for impaired skin integrity will decrease Outcome: Progressing   
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
## 2022-09-18 NOTE — Progress Notes (Signed)
Pt can be heard, in his room, talking to himself and yelling out occasionally.

## 2022-09-18 NOTE — Progress Notes (Signed)
   09/18/22 0555  15 Minute Checks  Location Bedroom  Visual Appearance Calm  Behavior Composed  Sleep (Behavioral Health Patients Only)  Calculate sleep? (Click Yes once per 24 hr at 0600 safety check) Yes  Documented sleep last 24 hours 4.75

## 2022-09-18 NOTE — Progress Notes (Signed)
Pt. Very restless, and anxious throughout the day, given Clonazepam and Hydroxyzine for agitation.

## 2022-09-19 DIAGNOSIS — F250 Schizoaffective disorder, bipolar type: Secondary | ICD-10-CM | POA: Diagnosis not present

## 2022-09-19 NOTE — Group Note (Signed)
Recreation Therapy Group Note   Group Topic:Coping Skills  Group Date: 09/19/2022 Start Time: 1330 End Time: 1415 Facilitators: Rosina Lowenstein, LRT, CTRS Location: Courtyard  Group Description: Music Reminisce. LRT encouraged patients to think of their favorite song(s) that reminded them of a positive memory or time in their life. LRT encouraged patient to talk about that memory aloud to the group. LRT played the song through a speaker for all to hear. LRT and patients discussed how thinking of a positive memory or time in their life can be used as a coping skill in everyday life post discharge.   Goal Area(s) Addressed: Patient will increase verbal communication by conversing with peers. Patient will contribute to group discussion with minimal prompting. Patient will reminisce a positive memory or moment in their life.    Affect/Mood: Appropriate   Participation Level: Active and Engaged   Participation Quality: Independent   Behavior: Appropriate, Calm, and Cooperative   Speech/Thought Process: Disorganized and Loose association   Insight: Fair   Judgement: Fair    Modes of Intervention: Music   Patient Response to Interventions:  Attentive and Receptive   Education Outcome:  Acknowledges education   Clinical Observations/Individualized Feedback: [name] was somewhat active in their participation of session activities and group discussion. Pt went inside early and did not return to the group. Pt appeared to be rubbing his head like he had a headache. LRT asked pt and he shared "I am just thinking". Pt was calm and pleasant while outside with LRT and peers.    Plan: Continue to engage patient in RT group sessions 2-3x/week.   Rosina Lowenstein, LRT, CTRS 09/19/2022 2:37 PM

## 2022-09-19 NOTE — Group Note (Signed)
Date:  09/19/2022 Time:  8:56 PM  Group Topic/Focus:  Healthy Communication:   The focus of this group is to discuss communication, barriers to communication, as well as healthy ways to communicate with others.    Participation Level:  Active  Participation Quality:  Appropriate  Affect:  Appropriate  Cognitive:  Appropriate  Insight: Appropriate  Engagement in Group:  Engaged  Modes of Intervention:  Education  Additional Comments:    [name] 09/19/2022, 8:56 PM

## 2022-09-19 NOTE — BHH Counselor (Signed)
CSW contacted [employer] at [phone].   They state they are private pay and don't take medicare or medicaid.   [name], MSW, [place] 09/19/2022 3:53 PM

## 2022-09-19 NOTE — BHH Counselor (Signed)
CSW contacted [facility] in [place] [phone])  They state they have no male beds available.    [name], MSW, [place] 09/19/2022 3:47 PM
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
## 2022-09-19 NOTE — Group Note (Signed)
LCSW Group Therapy Note  Group Date: 09/19/2022 Start Time: 1400 End Time: 1445   Type of Therapy and Topic:  Group Therapy - Healthy vs Unhealthy Coping Skills  Participation Level:  Active   Description of Group The focus of this group was to determine what unhealthy coping techniques typically are used by group members and what healthy coping techniques would be helpful in coping with various problems. Patients were guided in becoming aware of the differences between healthy and unhealthy coping techniques. Patients were asked to identify 2-3 healthy coping skills they would like to learn to use more effectively.  Therapeutic Goals Patients learned that coping is what human beings do all day long to deal with various situations in their lives Patients defined and discussed healthy vs unhealthy coping techniques Patients identified their preferred coping techniques and identified whether these were healthy or unhealthy Patients determined 2-3 healthy coping skills they would like to become more familiar with and use more often. Patients provided support and ideas to each other   Summary of Patient Progress:  During group, Zev  expressed good knowledge of subject matter. Patient proved open to input from peers and feedback from CSW. Patient demonstrated fair insight into the subject matter, was respectful of peers, and participated throughout the entire session.   Therapeutic Modalities Cognitive Behavioral Therapy Motivational Interviewing  [name], [place] 09/19/2022  3:14 PM

## 2022-09-19 NOTE — Plan of Care (Signed)
  Problem: Nutrition: Goal: Adequate nutrition will be maintained Outcome: Progressing   Problem: Coping: Goal: Level of anxiety will decrease Outcome: Progressing   Problem: Elimination: Goal: Will not experience complications related to bowel motility Outcome: Progressing   Problem: Safety: Goal: Ability to remain free from injury will improve Outcome: Progressing   Problem: Skin Integrity: Goal: Risk for impaired skin integrity will decrease Outcome: Progressing

## 2022-09-19 NOTE — BHH Counselor (Signed)
CSW contacted [employer] at [phone].   They state they are private pay and do not accept Medicaid.   [name], MSW, [place] 09/19/2022 3:51 PM

## 2022-09-19 NOTE — BHH Counselor (Incomplete)
CSW conta

## 2022-09-19 NOTE — BHH Counselor (Signed)
CSW contacted [facility] at [phone], they state that they are now called [facility] and have no beds available.    [name], MSW, [place] 09/19/2022 3:49 PM

## 2022-09-19 NOTE — BHH Counselor (Signed)
CSW contacted [facility] [phone])  They state they are private pay and don't take Medicare or Medicaid.    [name], MSW, [place] 09/19/2022 3:54 PM

## 2022-09-19 NOTE — Progress Notes (Signed)
D- Patient alert and oriented. Flat affect. Denies SI, HI, AVH, and pain.  A- Scheduled medications administered to patient, per MD orders. Support and encouragement provided.  Routine safety checks conducted every 15 minutes.  Patient informed to notify staff with problems or concerns. R- No adverse drug reactions noted. Patient contracts for safety at this time. Patient compliant with medications and treatment plan. Slept throughout the night. No behavior issues noted. No aggressive behavior noted. Patient remains safe at this time.
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
## 2022-09-20 DIAGNOSIS — F250 Schizoaffective disorder, bipolar type: Secondary | ICD-10-CM | POA: Diagnosis not present

## 2022-09-20 NOTE — Plan of Care (Signed)
  Problem: Coping: Goal: Level of anxiety will decrease Outcome: Progressing   Problem: Safety: Goal: Ability to remain free from injury will improve Outcome: Progressing   Problem: Skin Integrity: Goal: Risk for impaired skin integrity will decrease Outcome: Progressing

## 2022-09-20 NOTE — Plan of Care (Signed)
Problem: Coping: Goal: Level of anxiety will decrease Outcome: Progressing   Problem: Elimination: Goal: Will not experience complications related to bowel motility Outcome: Progressing   Problem: Safety: Goal: Ability to remain free from injury will improve Outcome: Progressing   Problem: Skin Integrity: Goal: Risk for impaired skin integrity will decrease Outcome: Progressing

## 2022-09-20 NOTE — Group Note (Signed)
Date:  09/20/2022 Time:  12:38 PM  Group Topic/Focus:  Outdoor recreation. Sales executive.    Participation Level:  Active  Participation Quality:  Appropriate and Attentive  Affect:  Appropriate  Cognitive:  Alert, Appropriate, and Oriented  Insight: Appropriate  Engagement in Group:  Developing/Improving and Engaged  Modes of Intervention:  Activity, Discussion, Socialization, and Support  Additional Comments:    Rosaura Carpenter 09/20/2022, 12:38 PM
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
## 2022-09-20 NOTE — BH IP Treatment Plan (Signed)
Interdisciplinary Treatment and Diagnostic Plan Update  09/20/2022 Time of Session: 2:09 pm [name] MRN: [id number]  Principal Diagnosis: Schizoaffective disorder, bipolar type [facility])  Secondary Diagnoses: Principal Problem:   Schizoaffective disorder, bipolar type (HCC)   Current Medications:  Current Facility-Administered Medications  Medication Dose Route Frequency Provider Last Rate Last Admin   amLODipine (NORVASC) tablet 10 mg  10 mg Oral Daily Sarina Ill, DO   10 mg at 09/20/22 0941   atorvastatin (LIPITOR) tablet 40 mg  40 mg Oral QHS Ardis Hughs, NP   40 mg at 09/19/22 2132   carbamazepine (TEGRETOL XR) 12 hr tablet 200 mg  200 mg Oral BID Sarina Ill, DO   200 mg at 09/20/22 0940   chlorproMAZINE (THORAZINE) tablet 200 mg  200 mg Oral QHS Sarina Ill, DO   200 mg at 09/19/22 2133   clonazePAM (KLONOPIN) tablet 2 mg  2 mg Oral BID Sarina Ill, DO   2 mg at 09/20/22 0941   cloNIDine (CATAPRES) tablet 0.1 mg  0.1 mg Oral Q8H PRN Sarina Ill, DO       diphenhydrAMINE (BENADRYL) capsule 50 mg  50 mg Oral BH-q8a4p Sarina Ill, DO   50 mg at 09/20/22 0940   fluPHENAZine (PROLIXIN) tablet 5 mg  5 mg Oral BH-q8a4p Sarina Ill, DO   5 mg at 09/20/22 0940   fluticasone furoate-vilanterol (BREO ELLIPTA) 200-25 MCG/ACT 1 puff  1 puff Inhalation Daily Ardis Hughs, NP   1 puff at 09/20/22 0942   HYDROcodone-acetaminophen (NORCO) 7.5-325 MG per tablet 1 tablet  1 tablet Oral Q6H PRN Sarina Ill, DO   1 tablet at 09/17/22 2109   losartan (COZAAR) tablet 100 mg  100 mg Oral Daily Ardis Hughs, NP   100 mg at 09/20/22 0940   OLANZapine (ZYPREXA) tablet 10 mg  10 mg Oral Q6H PRN Sarina Ill, DO   10 mg at 09/18/22 1257   Or   OLANZapine (ZYPREXA) injection 10 mg  10 mg Intramuscular Q6H PRN Sarina Ill, DO   10 mg at 09/03/22 0334   traZODone  (DESYREL) tablet 100 mg  100 mg Oral QHS Sarina Ill, DO   100 mg at 09/19/22 2132   PTA Medications: Medications Prior to Admission  Medication Sig Dispense Refill Last Dose   amantadine (SYMMETREL) 100 MG capsule Take 1 capsule (100 mg total) by mouth 2 (two) times daily. 60 capsule 3    amLODipine (NORVASC) 5 MG tablet Take 1 tablet (5 mg total) by mouth daily. 30 tablet 1    atorvastatin (LIPITOR) 40 MG tablet Take 1 tablet (40 mg total) by mouth at bedtime. 30 tablet 1    carbamazepine (TEGRETOL XR) 400 MG 12 hr tablet Take 1 tablet (400 mg total) by mouth at bedtime. 30 tablet 3    divalproex (DEPAKOTE) 250 MG DR tablet Take 250 mg by mouth 3 (three) times daily.      fluticasone furoate-vilanterol (BREO ELLIPTA) 200-25 MCG/ACT AEPB Inhale 1 puff into the lungs daily. 1 each 1    losartan (COZAAR) 100 MG tablet Take 1 tablet (100 mg total) by mouth daily. 30 tablet 3    Melatonin 10 MG TABS Take 10 mg by mouth at bedtime.      QUEtiapine (SEROQUEL) 25 MG tablet Take 25 mg by mouth at bedtime.      risperiDONE (RISPERDAL) 3 MG tablet Take 1 tablet (3 mg  total) by mouth 2 (two) times daily at 8 am and 4 pm. 60 tablet 3    traZODone (DESYREL) 100 MG tablet Take 1 tablet (100 mg total) by mouth at bedtime as needed for sleep. (Patient not taking: Reported on 08/06/2022) 30 tablet 3     Patient Stressors: Health problems   Medication change or noncompliance    Patient Strengths: Active sense of humor  Motivation for treatment/growth   Treatment Modalities: Medication Management, Group therapy, Case management,  1 to 1 session with clinician, Psychoeducation, Recreational therapy.   Physician Treatment Plan for Primary Diagnosis: Schizoaffective disorder, bipolar type (HCC) Long Term Goal(s): Improvement in symptoms so as ready for discharge   Short Term Goals: Ability to identify changes in lifestyle to reduce recurrence of condition will improve Ability to verbalize  feelings will improve Ability to disclose and discuss suicidal ideas Ability to demonstrate self-control will improve Ability to identify and develop effective coping behaviors will improve Ability to maintain clinical measurements within normal limits will improve Compliance with prescribed medications will improve Ability to identify triggers associated with substance abuse/mental health issues will improve  Medication Management: Evaluate patient's response, side effects, and tolerance of medication regimen.  Therapeutic Interventions: 1 to 1 sessions, Unit Group sessions and Medication administration.  Evaluation of Outcomes: Progressing  Physician Treatment Plan for Secondary Diagnosis: Principal Problem:   Schizoaffective disorder, bipolar type (HCC)  Long Term Goal(s): Improvement in symptoms so as ready for discharge   Short Term Goals: Ability to identify changes in lifestyle to reduce recurrence of condition will improve Ability to verbalize feelings will improve Ability to disclose and discuss suicidal ideas Ability to demonstrate self-control will improve Ability to identify and develop effective coping behaviors will improve Ability to maintain clinical measurements within normal limits will improve Compliance with prescribed medications will improve Ability to identify triggers associated with substance abuse/mental health issues will improve     Medication Management: Evaluate patient's response, side effects, and tolerance of medication regimen.  Therapeutic Interventions: 1 to 1 sessions, Unit Group sessions and Medication administration.  Evaluation of Outcomes: Progressing   RN Treatment Plan for Primary Diagnosis: Schizoaffective disorder, bipolar type (HCC) Long Term Goal(s): Knowledge of disease and therapeutic regimen to maintain health will improve  Short Term Goals: Ability to remain free from injury will improve, Ability to verbalize frustration and  anger appropriately will improve, Ability to demonstrate self-control, Ability to participate in decision making will improve, Ability to verbalize feelings will improve, Ability to disclose and discuss suicidal ideas, Ability to identify and develop effective coping behaviors will improve, and Compliance with prescribed medications will improve  Medication Management: RN will administer medications as ordered by provider, will assess and evaluate patient's response and provide education to patient for prescribed medication. RN will report any adverse and/or side effects to prescribing provider.  Therapeutic Interventions: 1 on 1 counseling sessions, Psychoeducation, Medication administration, Evaluate responses to treatment, Monitor vital signs and CBGs as ordered, Perform/monitor CIWA, COWS, AIMS and Fall Risk screenings as ordered, Perform wound care treatments as ordered.  Evaluation of Outcomes: Progressing   LCSW Treatment Plan for Primary Diagnosis: Schizoaffective disorder, bipolar type (HCC) Long Term Goal(s): Safe transition to appropriate next level of care at discharge, Engage patient in therapeutic group addressing interpersonal concerns.  Short Term Goals: Engage patient in aftercare planning with referrals and resources, Increase social support, Increase ability to appropriately verbalize feelings, Increase emotional regulation, Facilitate acceptance of mental health diagnosis and concerns, and  Increase skills for wellness and recovery  Therapeutic Interventions: Assess for all discharge needs, 1 to 1 time with Social worker, Explore available resources and support systems, Assess for adequacy in community support network, Educate family and significant other(s) on suicide prevention, Complete Psychosocial Assessment, Interpersonal group therapy.  Evaluation of Outcomes: Progressing   Progress in Treatment: Attending groups: Yes. Participating in groups: Yes. Taking medication as  prescribed: Yes. Toleration medication: Yes. Family/Significant other contact made: [name] ,[phone] extension [phone]  guardian  Patient understands diagnosis: Yes. Discussing patient identified problems/goals with staff: Yes. Medical problems stabilized or resolved: Yes. Denies suicidal/homicidal ideation: Yes. Issues/concerns per patient self-inventory: No. Other: none  New problem(s) identified: No, Describe:  none   New Short Term/Long Term Goal(s): elimination of symptoms of psychosis, medication management for mood stabilization; elimination of SI thoughts; development of comprehensive mental wellness plan. Update 08/21/22: No changes at this time Update 08/26/22: No changes at this time Update 08/31/22: None at this time. Update 09/05/22: No changes at this time Update 09/10/22: No changes at this time. Update 09/15/22: No changes at this time Update 09/20/22: None at this time.      Patient Goals:  Pt unable to participate in treatment team due to active psychosis. Treatment team documents were instead sent over to legal guardian at [facility] Update 08/21/22: No changes at this time Update 08/26/22: [facility] guardianship currently working with CSW on placement options for pt. Pt remains agitated and tangential in thought and speech Update 08/31/22: None at this time. Update 09/05/22: No changes at this time Update 09/10/22: No changes at this timeUpdate 09/15/22: No changes at this time Update 09/20/22: None at this time.    Discharge Plan or Barriers:  CSW to assist with appropriate discharge planning Update 08/21/22: No changes at this time  Update 08/26/22: Pt has been referred to multiple housing options by guardian, but unable to be accepted due to agitated behavior and concerns regarding pt's behavior from the facilities Update 08/31/22: None at this time. Update 09/05/22: No changes at this time Update 09/10/22: Pt's medications have been changed. Pt is less agitated and closer  to baseline. [name] states he has group home placement for pt. Pt should be ready for discharge soon according to provider Update 09/15/22: No changes at this time Update 09/20/22: The patient remains a placement concern. The patient is not quite ready for discharge.     Reason for Continuation of Hospitalization: Delusions  Medication stabilization   Estimated Length of Stay: 1 to 7 daysUpdate 08/21/22: No changes at this time Update 08/26/22: No changes at this time Update 08/31/22: None at this time. Update 09/05/22: No changes at this time Update 09/10/22: No changes at this time Update 09/15/22: No changes at this time Update 09/20/22: None at this time.   Last 3 [place] Suicide Severity Risk Score: Flowsheet Row Admission (Current) from 08/10/2022 in [facility] BE[facility]AVIORAL [facility] ED from 08/06/2022 in [facility] Emergency Department at [facility] Admission (Discharged) from 05/06/2022 in [facility] BEHAVIORAL MEDICINE  C-SSRS RISK CATEGORY No Risk No Risk No Risk       Last PHQ 2/9 Scores:     No data to display          Scribe for Treatment Team: Marshell Levan, LCSW 09/20/2022 2:09 PM

## 2022-09-20 NOTE — Group Note (Signed)
Date:  09/20/2022 Time:  5:59 PM  Group Topic/Focus:  Goals Group:   The focus of this group is to help patients establish daily goals to achieve during treatment and discuss how the patient can incorporate goal setting into their daily lives to aide in recovery.    Participation Level:  Active  Participation Quality:  Appropriate  Affect:  Appropriate  Cognitive:  Appropriate  Insight: Appropriate  Engagement in Group:  Developing/Improving  Modes of Intervention:  Activity  Additional Comments:    Ambrielle Kington 09/20/2022, 5:59 PM

## 2022-09-20 NOTE — Progress Notes (Signed)
Patient calm and cooperative. Flat affect.  Denies SI/HI and AVH. Denies pain. No behavioral issues.  Patient has not been observed responding to internal stimuli.   Compliant with scheduled medications.  15 min checks in place for safety.  Minimal presence in the milieu.  Minimal interaction with peers and staff.  Resting in room most of the shift.

## 2022-09-20 NOTE — Progress Notes (Signed)
D- Patient alert and oriented. Flat affect and irritable. Denies SI, HI, AVH, and pain.  A- Scheduled medications administered to patient, per MD orders. Support and encouragement provided.  Routine safety checks conducted every 15 minutes.  Patient informed to notify staff with problems or concerns. R- No adverse drug reactions noted. Patient contracts for safety at this time. Patient compliant with medications and treatment plan. Patient receptive to care. Slept throughout the night.  Patient remains safe at this time.

## 2022-09-20 NOTE — Group Note (Signed)
[facility] LCSW Group Therapy Note   Group Date: 09/20/2022 Start Time: 1320 End Time: 1405   Type of Therapy/Topic:  Group Therapy:  Balance in Life  Participation Level:  Active   Description of Group:    This group will address the concept of balance and how it feels and looks when one is unbalanced. Patients will be encouraged to process areas in their lives that are out of balance, and identify reasons for remaining unbalanced. Facilitators will guide patients utilizing problem- solving interventions to address and correct the stressor making their life unbalanced. Understanding and applying boundaries will be explored and addressed for obtaining  and maintaining a balanced life. Patients will be encouraged to explore ways to assertively make their unbalanced needs known to significant others in their lives, using other group members and facilitator for support and feedback.  Therapeutic Goals: Patient will identify two or more emotions or situations they have that consume much of in their lives. Patient will identify signs/triggers that life has become out of balance:  Patient will identify two ways to set boundaries in order to achieve balance in their lives:  Patient will demonstrate ability to communicate their needs through discussion and/or role plays  Summary of Patient Progress:   Pt. Identified wife and daughter as most important things in his life, and worrying as a sign his life is unbalanced. Pt. Uses music to ground himself and memories of good times to find balance.    Therapeutic Modalities:   Cognitive Behavioral Therapy Solution-Focused Therapy Assertiveness Training   Azucena Kuba, LCSWA

## 2022-09-21 DIAGNOSIS — F250 Schizoaffective disorder, bipolar type: Secondary | ICD-10-CM | POA: Diagnosis not present

## 2022-09-21 NOTE — Plan of Care (Signed)
  Problem: Education: Goal: Knowledge of General Education information will improve Description: Including pain rating scale, medication(s)/side effects and non-pharmacologic comfort measures Outcome: Progressing   Problem: Health Behavior/Discharge Planning: Goal: Ability to manage health-related needs will improve Outcome: Progressing   Problem: Clinical Measurements: Goal: Ability to maintain clinical measurements within normal limits will improve Outcome: Progressing Goal: Cardiovascular complication will be avoided Outcome: Progressing   Problem: Nutrition: Goal: Adequate nutrition will be maintained Outcome: Progressing   Problem: Coping: Goal: Level of anxiety will decrease Outcome: Progressing   Problem: Elimination: Goal: Will not experience complications related to bowel motility Outcome: Progressing   Problem: Safety: Goal: Ability to remain free from injury will improve Outcome: Progressing   Problem: Skin Integrity: Goal: Risk for impaired skin integrity will decrease Outcome: Progressing

## 2022-09-21 NOTE — Progress Notes (Signed)
   09/21/22 1950  Psych Admission Type (Psych Patients Only)  Admission Status Involuntary  Psychosocial Assessment  Patient Complaints None  Eye Contact Brief  Facial Expression Flat  Affect Flat  Speech Soft  Interaction Minimal  Motor Activity Slow  Appearance/Hygiene In scrubs  Behavior Characteristics Cooperative  Mood Pleasant  Thought Process  Coherency Flight of ideas  Content Preoccupation;Delusions  Delusions Grandeur  Perception Hallucinations  Hallucination None reported or observed  Judgment Impaired  Confusion Mild  Danger to Self  Current suicidal ideation? Denies  Danger to Others  Danger to Others None reported or observed   Progress note   D: Pt seen in his room. Pt denies SI, HI, AVH. Pt rates pain  0/10. Pt rates anxiety  0/10 and depression  0/10. Pt goes to groups and eats his meals. Calm and cooperative. No other concerns noted at this time.  A: Pt provided support and encouragement. Pt given scheduled medication as prescribed. PRNs as appropriate. Q15 min checks for safety.   R: Pt safe on the unit. Will continue to monitor.

## 2022-09-21 NOTE — Progress Notes (Signed)
Patient present in the dayroom for breakfast.  Calm and cooperative.  Flat affect.  Denies SI/HI.  Denies pain. Patient appears tired, sitting with blanket.    Compliant with scheduled mediations. 15 min checks in place for safety.  Minimal interaction with peers and staff.   No behavioral issues.   Patient able to speak with social work per patient request.

## 2022-09-21 NOTE — Plan of Care (Signed)
°  Problem: Nutrition: °Goal: Adequate nutrition will be maintained °Outcome: Progressing °  °Problem: Coping: °Goal: Level of anxiety will decrease °Outcome: Progressing °  °Problem: Safety: °Goal: Ability to remain free from injury will improve °Outcome: Progressing °  °
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
## 2022-09-21 NOTE — Group Note (Signed)
Date:  09/21/2022 Time:  11:03 AM  Group Topic/Focus:  Outside Rec/Music Therapy This group allows paitents to get out and get fresh air while listening to their favorite music   Participation Level:  Active  Participation Quality:  Appropriate  Affect:  Appropriate  Cognitive:  Alert and Appropriate  Insight: Appropriate  Engagement in Group:  Engaged  Modes of Intervention:  Activity and Discussion  Additional Comments:    Marta Antu 09/21/2022, 11:03 AM

## 2022-09-21 NOTE — Progress Notes (Signed)
Patient was in the bed resting quietly most of the shift, current writer was not able to give the patient his medication due to him being sleep for most of the shift.

## 2022-09-21 NOTE — Group Note (Signed)
Date:  09/21/2022 Time:  12:08 AM  Group Topic/Focus:  Overcoming Stress:   The focus of this group is to define stress and help patients assess their triggers.    Participation Level:  Did Not Attend  Participation Quality:   Did Not Attend  Affect:   Did Not Attend  Cognitive:   Did Not Attend  Insight: None  Engagement in Group:  None  Modes of Intervention:  Education  Additional Comments:    [name] 09/21/2022, 12:08 AM

## 2022-09-21 NOTE — Group Note (Unsigned)
   Date:  09/22/2022 Time:  12:20 AM  Group Topic/Focus:  Making Healthy Choices:   The focus of this group is to help patients identify negative/unhealthy choices they were using prior to admission and identify positive/healthier coping strategies to replace them upon discharge.    Participation Level:  Active  Participation Quality:  Appropriate  Affect:  Appropriate  Cognitive:  Appropriate  Insight: Good  Engagement in Group:  Improving  Modes of Intervention:  Discussion  Additional Comments:    [name] 09/22/2022, 12:20 AM

## 2022-09-22 DIAGNOSIS — F250 Schizoaffective disorder, bipolar type: Secondary | ICD-10-CM | POA: Diagnosis not present

## 2022-09-22 NOTE — BHH Counselor (Signed)
CSW contacted [name] per pt's request. Unable to reach, unable to leave voice message.    [name], MSW, [place] 09/22/2022 11:13 AM

## 2022-09-22 NOTE — BHH Counselor (Signed)
CSW contacted Birder Robson [phone]) at [facility] and [facility], unable to reach, LVM.   [name], MSW, [place] 09/22/2022 2:24 PM

## 2022-09-22 NOTE — BHH Counselor (Signed)
CSW contacted Langley Adie [phone]), she states she has no beds available at this time.

## 2022-09-22 NOTE — BHH Counselor (Signed)
CSW contacted Weston Brass (group home contact)  [phone], LVM.   [name], MSW, [place] 09/22/2022 2:32 PM

## 2022-09-22 NOTE — BHH Counselor (Signed)
CSW attempted to contact the following to identify possible bed:  [facility], [facility] [place], [place] [phone] Rhona Leavens [address] CSW left HIPAA compliant voicemail.    [facility], [facility], [facility] [place]ade [phone] Gladis Riffle [address] CSW left HIPAA compliant voicemail.    [facility] L[place]an Raya [phone]   Marlowe [place], Executive Director [address] [place] No male beds available.  There is a waitlist, name put on the waitlist.  [name] [facility] [place], [employer]. [facility] [place] [phone] Jacinto Reap. Webb Silversmith, President & CEO [address] [facility] [address] [phone] Voicemail indicates that for placement, calls should be made to [phone].  CSW dialed the requested number and calls would not go through.     [name] [facility] [place], [employer]. [facility] Denise [place] [phone] Jacinto Reap. Webb Silversmith, CEO [address] [phone] Voicemail indicates that for placement, calls should be made to [phone] [phone].  CSW dialed the requested number and calls would not go through.     [address] [place], [place] [phone] CEO / Owner [address] [place] No male beds available at this time.  Beds available coming up in mid October/early November.  Suggested that CSW contact, [phone], Mr. [place], however this number was incorrect.    [facility] [address] [place] [phone] Dr. Emerson Monte, CEO [address] [place],. [place]. 101 No male beds available.  Facility full.    [facility], [facility], [place] [address] Kerrie Pleasure [phone] Owner [address] Line rang then  became a fax like noise.   [facility], [facility], [place] [address] [place] [phone] Carolyne Littles, CEO [address] CSW was asked to call Oswaldo Done, [place] for the group homes [phone].  Opening in [place].  Information to be sent to [phone], attention of Kerrie Pleasure.  [facility], [facility], [place] [address] Travilah Aridegbe [phone]   Assistant President [phone] Number not in working order.     [name], MSW, LCSW 09/22/2022 4:24 PM

## 2022-09-22 NOTE — Progress Notes (Signed)
   09/22/22 1200  Spiritual Encounters  Type of Visit Follow up  Care provided to: Patient  Conversation partners present during encounter Nurse  Referral source Chaplain assessment  Reason for visit Routine spiritual support  OnCall Visit No  Spiritual Framework  Presenting Themes Coping tools;Significant life change  Interventions  Spiritual Care Interventions Made Encouragement  Intervention Outcomes  Outcomes Reduced anxiety   Chaplain met with patient in the hallway and noticed he had his head down. Patient shared with the chaplain that he is just ready to leave. He stated that he keeps hearing that he will leave but has not been able to leave yet. Chaplain offered words of hope and encouragement to the patient. Chaplain offered compassionate presence for the patient. The patient was thankful the chaplain stopped by to see him.

## 2022-09-22 NOTE — Group Note (Signed)
Date:  09/22/2022 Time:  10:00 AM  Group Topic/Focus:  Making Healthy Choices:   The focus of this group is to help patients identify negative/unhealthy choices they were using prior to admission and identify positive/healthier coping strategies to replace them upon discharge.    Participation Level:  Did Not Attend    [name] 09/22/2022, 10:00 AM

## 2022-09-22 NOTE — Plan of Care (Signed)
  Problem: Clinical Measurements: Goal: Ability to maintain clinical measurements within normal limits will improve Outcome: Progressing Goal: Cardiovascular complication will be avoided Outcome: Progressing   Problem: Nutrition: Goal: Adequate nutrition will be maintained Outcome: Progressing   Problem: Coping: Goal: Level of anxiety will decrease Outcome: Progressing   Problem: Elimination: Goal: Will not experience complications related to bowel motility Outcome: Progressing   Problem: Safety: Goal: Ability to remain free from injury will improve Outcome: Progressing   Problem: Skin Integrity: Goal: Risk for impaired skin integrity will decrease Outcome: Progressing

## 2022-09-22 NOTE — Plan of Care (Signed)
  Problem: Education: Goal: Knowledge of General Education information will improve Description: Including pain rating scale, medication(s)/side effects and non-pharmacologic comfort measures Outcome: Progressing   Problem: Health Behavior/Discharge Planning: Goal: Ability to manage health-related needs will improve Outcome: Progressing   Problem: Clinical Measurements: Goal: Ability to maintain clinical measurements within normal limits will improve Outcome: Progressing Goal: Cardiovascular complication will be avoided Outcome: Progressing   Problem: Nutrition: Goal: Adequate nutrition will be maintained Outcome: Progressing   Problem: Coping: Goal: Level of anxiety will decrease Outcome: Progressing   Problem: Elimination: Goal: Will not experience complications related to bowel motility Outcome: Progressing   Problem: Safety: Goal: Ability to remain free from injury will improve Outcome: Progressing   Problem: Skin Integrity: Goal: Risk for impaired skin integrity will decrease Outcome: Progressing

## 2022-09-22 NOTE — BHH Counselor (Signed)
CSW sent FL2 to [facility] per her request for review.   [name], MSW, [place] 09/22/2022 2:28 PM

## 2022-09-22 NOTE — Progress Notes (Signed)
   09/22/22 0555  15 Minute Checks  Location Bedroom  Visual Appearance Calm  Behavior Sleeping  Sleep (Behavioral Health Patients Only)  Calculate sleep? (Click Yes once per 24 hr at 0600 safety check) Yes  Documented sleep last 24 hours 14

## 2022-09-22 NOTE — BHH Counselor (Signed)
CSW made the following contacts to group homes in order to secure placement. All group homes listed below are in [facility].   [facility] [phone]), unable to [facility] [phone]), number is disconnected   [facility] [phone]), number is disconnected   [facility] [phone]), left VM requesting return call   [facility] [phone]), number is disconnected   [facility] [phone]), LVM requesting return call   [facility] [phone]), sent FL2 to Janace Aris per their request  Sedalia Muta [phone]) unable to reach, number disconnected   [facility] [phone]), answered, they state they take private pay only  [facility] [phone]), number is disconnected  [facility] [phone]), number is disconnected   [facility] [phone]), answered, they state no malen bed   [facility] [phone]), unable to reach, LVM requesting return call   [facility] [phone]), number disconnected   [facility] [phone]), LVM requesting return call   [facility] [phone]), number disconnected   [facility] [phone]), unable to [facility] [phone]), sent pt's FL2 to Orbie Pyo per their request   [facility] [phone]) they state they have no male beds available   CSW will continue to search.  [name], MSW, [place] 09/22/2022 4:17 PM

## 2022-09-22 NOTE — Group Note (Signed)
Recreation Therapy Group Note   Group Topic:Goal Setting  Group Date: 09/22/2022 Start Time: 1400 End Time: 1500 Facilitators: Rosina Lowenstein, LRT, CTRS Location:  Day room  Group Description: Vision Board. Patients were given many different magazines, a glue stick, markers, and a piece of cardstock paper. LRT and pts discussed the importance of having goals in life. LRT and pts discussed the difference between short-term and long-term goals, as well as what a SMART goal is. LRT encouraged pts to create a vision board, with images they picked and then cut out with safety scissors from the magazine, for themselves, that capture their short and long-term goals. LRT encouraged pts to show and explain their vision board to the group.   Goal Area(s) Addressed:  Patient will gain knowledge of short vs. long term goals.  Patient will identify goals for themselves. Patient will practice setting SMART goals. Patient will verbalize their goals to LRT and peers.   Affect/Mood: Appropriate   Participation Level: Active and Engaged   Participation Quality: Independent   Behavior: Calm and Cooperative   Speech/Thought Process: Loose association   Insight: Fair   Judgement: Good   Modes of Intervention: Art and Activity   Patient Response to Interventions:  Attentive, Engaged, Interested , and Receptive   Education Outcome:  Acknowledges education   Clinical Observations/Individualized Feedback: Rhody was active in their participation of session activities and group discussion. Pt identified "lots of babies" as his goal. Pt appropriately identified images to reflect these goals. Pt interacted well with LRT and peers duration of session. Pt was in bright spirits as he was smiling and singing along with the background music being played.    Plan: Continue to engage patient in RT group sessions 2-3x/week.   Rosina Lowenstein, LRT, CTRS 09/22/2022 3:17 PM

## 2022-09-22 NOTE — Progress Notes (Signed)
   09/22/22 0735  Psych Admission Type (Psych Patients Only)  Admission Status Involuntary  Psychosocial Assessment  Patient Complaints None  Eye Contact Poor  Facial Expression Flat  Affect Sullen  Speech Soft  Interaction Needy  Motor Activity Slow  Appearance/Hygiene In scrubs  Behavior Characteristics Cooperative  Mood Pleasant  Thought Chartered certified accountant of ideas;Tangential  Content Preoccupation  Delusions Grandeur  Perception Hallucinations  Hallucination None reported or observed  Judgment Impaired  Confusion Mild  Danger to Self  Current suicidal ideation? Denies

## 2022-09-22 NOTE — BHH Counselor (Signed)
CSW contacted [facility], group home contact [phone], unable to LVM  [name], MSW, [facility] 09/22/2022 2:31 PM
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
## 2022-09-22 NOTE — BHH Counselor (Signed)
CSW contact [facility] contact [phone]).   She states she has one bed available.   CSW faxed requested information to [phone].   [name], MSW, [place] 09/22/2022 2:14 PM

## 2022-09-22 NOTE — Group Note (Unsigned)
Date:  09/23/2022 Time:  12:05 AM  Group Topic/Focus:  Building Self Esteem:   The Focus of this group is helping patients become aware of the effects of self-esteem on their lives, the things they and others do that enhance or undermine their self-esteem, seeing the relationship between their level of self-esteem and the choices they make and learning ways to enhance self-esteem.    Participation Level:  Did Not Attend  Participation Quality:      Affect:      Cognitive:      Insight: None  Engagement in Group:  None  Modes of Intervention:      Additional Comments:    [name] 09/23/2022, 12:05 AM

## 2022-09-23 DIAGNOSIS — F250 Schizoaffective disorder, bipolar type: Secondary | ICD-10-CM | POA: Diagnosis not present

## 2022-09-23 MED ORDER — CLONAZEPAM 1 MG PO TABS
1.0000 mg | ORAL_TABLET | Freq: Two times a day (BID) | ORAL | Status: DC
  Administered 2022-09-23 – 2022-10-02 (×17): 1 mg via ORAL
  Filled 2022-09-23 (×18): qty 1

## 2022-09-23 NOTE — BHH Counselor (Signed)
CSW contacted [name] to confirm pt's interview for group home.   [name] states he can meet with pt 09/24/22 at 10:00 AM    [name], MSW, [facility] 09/23/2022 2:47 PM

## 2022-09-23 NOTE — Progress Notes (Signed)
D-Pt is sleepy and not oriented to situation. Pt does not report any anxiety/depression or pain at this time.Pt does not report  any SI/HI or AVH at this time.   A- Scheduled medications administered to pt, per MD orders. Support and encouragement provided. Frequent verbal contact made. Routine safety checks conducted  q15 minutes.   R- No adverse drug reactions noted. Pt verbally contracts at this time for safety at this time. Pt complaint with medications and treatment plan. Pt remains safe at this time . Plan of care ongoing.

## 2022-09-23 NOTE — Group Note (Signed)
[facility] LCSW Group Therapy Note    Group Date: 09/23/2022 Start Time: 1315 End Time: 1400  Type of Therapy and Topic:  Group Therapy:  Overcoming Obstacles  Participation Level:  BHH PARTICIPATION LEVEL: Active  Mood:  Description of Group:   In this group patients will be encouraged to explore what they see as obstacles to their own wellness and recovery. They will be guided to discuss their thoughts, feelings, and behaviors related to these obstacles. The group will process together ways to cope with barriers, with attention given to specific choices patients can make. Each patient will be challenged to identify changes they are motivated to make in order to overcome their obstacles. This group will be process-oriented, with patients participating in exploration of their own experiences as well as giving and receiving support and challenge from other group members.  Therapeutic Goals: 1. Patient will identify personal and current obstacles as they relate to admission. 2. Patient will identify barriers that currently interfere with their wellness or overcoming obstacles.  3. Patient will identify feelings, thought process and behaviors related to these barriers. 4. Patient will identify two changes they are willing to make to overcome these obstacles:    Summary of Patient Progress   Pt was sullen throughout group and cried for most of group. CSW spoke with pt following group and pt stated he was just ready to go home    Therapeutic Modalities:   Cognitive Behavioral Therapy Solution Focused Therapy Motivational Interviewing Relapse Prevention Therapy   [name], LCSWA

## 2022-09-23 NOTE — Group Note (Signed)
Recreation Therapy Group Note   Group Topic:General Recreation  Group Date: 09/23/2022 Start Time: 1400 End Time: 1455 Facilitators: Rosina Lowenstein, LRT, CTRS Location:  Day Room  Group Description: Bingo. LRT and patients played multiple games of Bingo with music playing in the background. LRT and pts discussed how this could be a leisure interest and the importance of doing things they enjoy post-discharge.   Goal Area(s) Addressed: Patient will identify leisure interests.  Patient will practice healthy decision making. Patient will engage in recreation activity.    Affect/Mood: Appropriate and Flat   Participation Level: Moderate   Participation Quality: Independent   Behavior: Calm and On-looking   Speech/Thought Process: Coherent   Insight: Fair   Judgement: Fair    Modes of Intervention: Cooperative Play   Patient Response to Interventions:  Receptive   Education Outcome:  Acknowledges education   Clinical Observations/Individualized Feedback: [name] was mostly active in their participation of session activities and group discussion. Pt played a game of bingo before sharing that he was done playing. Pt became teary eyed and said that he wanted to leave. LRT provided comfort and reassurance. Pt sat with his head down the remainder of group.   Plan: Continue to engage patient in RT group sessions 2-3x/week.   Rosina Lowenstein, LRT, CTRS 09/23/2022 3:16 PM

## 2022-09-23 NOTE — BHH Counselor (Signed)
CSW contacted the following group homes:    [facility]:  [facility], LVM [phone] Fax: [phone]  [place]: Barbarann Ehlers, answered: they state they have ICF only beds  [phone] Fax: [phone]   [facility] [address], unable to reach, phone disconnected [phone] Fax: [phone]   [facility], number not in service [phone] Fax: [phone]   [facility], unable to LVM [phone] Fax: [phone]   [facility] [phone] Fax: [phone]  They state they need the following items sent to them at [email or web address] H&P, last week of progress notes   [facility] [phone] Fax: [phone]  H&P and FL2  [email or web address]   [place]: no group homes available   [place]:   [facility], state they are full [phone] Fax: [phone]  [facility] [phone] Fax: [phone]

## 2022-09-23 NOTE — BHH Counselor (Signed)
CSW contacted the following counties in search of bed placement:  [facility] [place] [place] [phone] [place] A[place] K. [facility] [address] Voicemail indicates that for placement, calls should be made to [phone] [phone].  CSW dialed the requested number and calls would not go through.       [facility] [place] [place] [phone] Jacinto Reap. [facility] [address] Voicemail indicates that for placement, calls should be made to [phone] [phone].  CSW dialed the requested number and calls would not go through.      [facility] [address] [place] Juliette Alcide [phone]     [id number] Requested that information be emailed to [email or web address].  [facility] [employer] [phone] [facility] [address] CSW left HIPAA compliant voicemail       [facility] [place] [phone] Emerson Monte, CEO [address] [place], [address] No beds available at this time.     [facility] [address] [place] [phone] Vice President of Non-Profit Company [address] Line rings and picks up as if someone has answered but there is nothing but static.  [facility] Sharen Hint [phone] Owner [phone] [address] Number not in service.    [facility] [employer] [employer] Susa Simmonds [place] [phone] President / Owner [address] [address] CSW left HIPAA compliant voicemail  [facility] NENA E [place] [phone] Nena  E. Donney Dice, Director/Owner [address] [address] CSW left HIPAA compliant voicemail  [facility] NENA E. [place] [phone] Nena E. [place], Risk analyst / Owner [address] [address] CSW left HIPAA compliant voicemail  [facility] and Margaretann [facility] [place] [facility] [place] [phone] Juliet Rude, Agency Director [address] CSW left HIPAA compliant voicemail  [facility] [employer] [place] [phone] President [address] [address] CSW left HIPAA compliant voicemail    [name], MSW, LCSW 09/23/2022 4:12 PM

## 2022-09-23 NOTE — Group Note (Signed)
Date:  09/23/2022 Time:  9:56 PM  Group Topic/Focus:  Early Warning Signs:   The focus of this group is to help patients identify signs or symptoms they exhibit before slipping into an unhealthy state or crisis.    Participation Level:  Did Not Attend  Participation Quality:      Affect:      Cognitive:      Insight: None  Engagement in Group:  None  Modes of Intervention:  Activity  Additional Comments:    [name] 09/23/2022, 9:56 PM

## 2022-09-23 NOTE — Plan of Care (Signed)
  Problem: Education: Goal: Knowledge of General Education information will improve Description: Including pain rating scale, medication(s)/side effects and non-pharmacologic comfort measures Outcome: Progressing   Problem: Health Behavior/Discharge Planning: Goal: Ability to manage health-related needs will improve Outcome: Progressing   Problem: Clinical Measurements: Goal: Ability to maintain clinical measurements within normal limits will improve Outcome: Progressing Goal: Cardiovascular complication will be avoided Outcome: Progressing   Problem: Nutrition: Goal: Adequate nutrition will be maintained Outcome: Progressing   Problem: Coping: Goal: Level of anxiety will decrease Outcome: Progressing   Problem: Elimination: Goal: Will not experience complications related to bowel motility Outcome: Progressing   Problem: Safety: Goal: Ability to remain free from injury will improve Outcome: Progressing   Problem: Skin Integrity: Goal: Risk for impaired skin integrity will decrease Outcome: Progressing

## 2022-09-23 NOTE — Progress Notes (Signed)
   09/23/22 1000  Psych Admission Type (Psych Patients Only)  Admission Status Involuntary  Psychosocial Assessment  Patient Complaints None  Eye Contact Poor  Facial Expression Flat  Affect Sullen  Speech Soft  Interaction Needy  Motor Activity Slow  Appearance/Hygiene In scrubs  Behavior Characteristics Calm  Mood Sullen  Thought Chartered certified accountant of ideas;Tangential  Content Preoccupation  Delusions Grandeur  Perception Hallucinations  Hallucination None reported or observed  Judgment Impaired  Confusion Mild  Danger to Self  Current suicidal ideation? Denies
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
## 2022-09-24 DIAGNOSIS — F250 Schizoaffective disorder, bipolar type: Secondary | ICD-10-CM | POA: Diagnosis not present

## 2022-09-24 NOTE — Progress Notes (Signed)
D- Patient alert and oriented. Calm and cooperative. Denies SI, HI, AVH, and pain.  A- Scheduled medications administered to patient, per MD orders. No PRNs given.  Support and encouragement provided.  Routine safety checks conducted every 15 minutes.  Patient informed to notify staff with problems or concerns. R- No adverse drug reactions noted. Patient contracts for safety at this time. Patient compliant with medications and treatment plan. Patient receptive to care. No behavior issues noted. Patient interacts well with others on the unit.  Slept well throughout the night. Patient remains safe at this time.

## 2022-09-24 NOTE — Progress Notes (Signed)
   09/24/22 1400  Spiritual Encounters  Type of Visit Follow up  Care provided to: Patient  Conversation partners present during encounter Nurse;Social worker/Care management/TOC  Referral source Chaplain assessment  Reason for visit Routine spiritual support  OnCall Visit No  Spiritual Framework  Presenting Themes Coping tools;Impactful experiences and emotions;Courage hope and growth  Patient Stress Factors Major life changes  Family Stress Factors None identified  Interventions  Spiritual Care Interventions Made Encouragement  Intervention Outcomes  Outcomes Awareness of support   Chaplain met with patient outside during group activity to see how he is doing. Patient shared that he had a meeting today to leave the hospital. Patient was very excited about the possibility of leaving soon. Patient was happy and smiling today. The chaplain has not seen him like that in a couple of weeks. Will continue to check on the patients progress.

## 2022-09-24 NOTE — Group Note (Signed)
Recreation Therapy Group Note   Group Topic:Communication  Group Date: 09/24/2022 Start Time: 1400 End Time: 1445 Facilitators: Rosina Lowenstein, LRT, CTRS Location: Courtyard   Group Description: Emotional Check in. Patient sat and talked with LRT about how they are doing and whatever else is on their mind. LRT provided active listening, reassurance and encouragement. Pts were given the opportunity to listen to music or play cornhole while getting fresh air and sunlight in the courtyard.    Goal Area(s) Addressed: Patient will engage in conversation with LRT. Patient will communicate their wants, needs, or questions.  Patient will practice a new coping skill of "talking to someone".   Affect/Mood: Appropriate   Participation Level: Active and Engaged   Participation Quality: Independent   Behavior: Appropriate, Calm, and Cooperative   Speech/Thought Process: Coherent   Insight: Good   Judgement: Good   Modes of Intervention: Open Conversation   Patient Response to Interventions:  Attentive and Receptive   Education Outcome:  Acknowledges education   Clinical Observations/Individualized Feedback: [name] was active in their participation of session activities and group discussion. Pt identified "I had an interview today". Pt shared that he thinks it went well. Pt interacted well with LRT and peers duration of session.   Plan: Continue to engage patient in RT group sessions 2-3x/week.   Rosina Lowenstein, LRT, CTRS 09/24/2022 2:55 PM

## 2022-09-24 NOTE — Group Note (Signed)
Date:  09/24/2022 Time:  10:31 PM  Group Topic/Focus:  Crisis Planning:   The purpose of this group is to help patients create a crisis plan for use upon discharge or in the future, as needed.    Participation Level:  Did Not Attend  Participation Quality:   Did Not Attend  Affect:   Did Not Attend  Cognitive:   Did Not Attend  Insight: None  Engagement in Group:  None  Modes of Intervention:   Did Not Attend  Additional Comments:    [name] 09/24/2022, 10:31 PM

## 2022-09-24 NOTE — Progress Notes (Signed)
   09/24/22 2100  Psych Admission Type (Psych Patients Only)  Admission Status Involuntary  Psychosocial Assessment  Patient Complaints None  Eye Contact Brief  Facial Expression Flat  Affect Sullen  Speech Soft  Interaction Minimal  Motor Activity Slow  Appearance/Hygiene In hospital gown  Behavior Characteristics Cooperative;Other (Comment) (drowsy)  Mood Other (Comment) (drowsy)  Thought Process  Coherency Tangential  Content UTA  Delusions UTA  Perception UTA  Hallucination UTA  Judgment UTA  Confusion UTA  Danger to Self  Current suicidal ideation? Denies  Agreement Not to Harm Self Yes  Description of Agreement Verbal  Danger to Others  Danger to Others None reported or observed
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
## 2022-09-24 NOTE — BHH Counselor (Signed)
CSW contacted [name], group home coordinator, at [phone] to confirm interview for pt's placement today.   He states he will meet CSW at [facility] around 10:30 AM.   CSW met [name] in [facility] and took him to the unit through the [employer].   CSW attempted to have [place] meet in the consultation room, however nurse check-offs were occurring in that room, therefore CSW had to take [name] to [place] Room.   CSW led [name] to [place] Room and had him sit.   CSW went to get pt and bring him to [place] Room.   CSW sat in on interview between pt and Mr.Clement.   Following interview CSW brought pt back to dayroom.   CSW led clement through [place] and to elevators.    [name] states interview went well and that he is willing to take pt but needs to get in contact with his guardian.   CSW gave [facility] guardianship information.   [name] states he will follow-up with CSW later in the afternoon.   [name], MSW, [place] 09/24/2022 12:53 PM

## 2022-09-24 NOTE — Plan of Care (Signed)
D: Pt alert and oriented. Pt denies experiencing any anxiety/depression at this time. Pt denies experiencing any pain at this time. Pt denies experiencing any SI/HI, or AVH at this time.   A: Scheduled medications administered to pt, per MD orders. Support and encouragement provided. Frequent verbal contact made. Routine safety checks conducted q15 minutes.   R: No adverse drug reactions noted. Pt verbally contracts for safety at this time. Pt compliant with medications and treatment plan. Pt interacts well with others on the unit. Pt remains safe at this time. Plan of care ongoing.  Pt verbalizes excitement in regards to being placed.   Problem: Nutrition: Goal: Adequate nutrition will be maintained Outcome: Progressing   Problem: Education: Goal: Knowledge of General Education information will improve Description: Including pain rating scale, medication(s)/side effects and non-pharmacologic comfort measures Outcome: Not Progressing

## 2022-09-24 NOTE — BHH Counselor (Signed)
CSW sent H&P and FL2 to [email or web address]  per their request   [name], MSW, [facility] 09/24/2022 8:31 AM

## 2022-09-24 NOTE — Group Note (Signed)
Date:  09/24/2022 Time:  11:08 AM  Group Topic/Focus:  Goals Group:The focus of this group is to help patients establish short term and long term goals.Creating a realistic plan that the patients can accomplish when they are discharged.    Participation Level:  Active  Participation Quality:  Appropriate and Sharing  Affect:  Appropriate  Cognitive:  Appropriate and Disorganized  Insight: Good and Improving  Engagement in Group:  Improving and Limited  Modes of Intervention:  Activity and Discussion  Additional Comments:    Marta Antu 09/24/2022, 11:08 AM

## 2022-09-24 NOTE — BHH Counselor (Signed)
CSW sent requested documents to [email or web address]   [name], MSW, [facility] 09/24/2022 8:25 AM

## 2022-09-25 DIAGNOSIS — F250 Schizoaffective disorder, bipolar type: Secondary | ICD-10-CM | POA: Diagnosis not present

## 2022-09-25 NOTE — Plan of Care (Signed)
?  Problem: Coping: ?Goal: Level of anxiety will decrease ?Outcome: Progressing ?  ?Problem: Safety: ?Goal: Ability to remain free from injury will improve ?Outcome: Progressing ?  ?

## 2022-09-25 NOTE — BHH Counselor (Signed)
CSW contact pt's rep payee [name] [phone] to discuss getting his funds transferred to his new group home.   CSW informed [name] that the payee would like to talk with him directly as he is group Patent attorney.    [name], MSW, [place] 09/25/2022 11:11 AM

## 2022-09-25 NOTE — Group Note (Signed)
Date:  09/25/2022 Time:  8:51 PM  Group Topic/Focus:  Healthy Communication:   The focus of this group is to discuss communication, barriers to communication, as well as healthy ways to communicate with others.    Participation Level:  Active  Participation Quality:  Appropriate  Affect:  Appropriate  Cognitive:  Appropriate  Insight: Appropriate  Engagement in Group:  Engaged  Modes of Intervention:  Discussion  Additional Comments:    Burt Ek 09/25/2022, 8:51 PM

## 2022-09-25 NOTE — BH IP Treatment Plan (Signed)
Interdisciplinary Treatment and Diagnostic Plan Update  09/25/2022 Time of Session: 9:30 AM  [name] MRN: [id number]  Principal Diagnosis: Schizoaffective disorder, bipolar type (HCC)  Secondary Diagnoses: Principal Problem:   Schizoaffective disorder, bipolar type (HCC)   Current Medications:  Current Facility-Administered Medications  Medication Dose Route Frequency Provider Last Rate Last Admin   amLODipine (NORVASC) tablet 10 mg  10 mg Oral Daily Sarina Ill, DO   10 mg at 09/25/22 3244   atorvastatin (LIPITOR) tablet 40 mg  40 mg Oral QHS Ardis Hughs, NP   40 mg at 09/24/22 2040   carbamazepine (TEGRETOL XR) 12 hr tablet 200 mg  200 mg Oral BID Sarina Ill, DO   200 mg at 09/25/22 0102   chlorproMAZINE (THORAZINE) tablet 200 mg  200 mg Oral QHS Sarina Ill, DO   200 mg at 09/23/22 2123   clonazePAM (KLONOPIN) tablet 1 mg  1 mg Oral BID Lewanda Rife, MD   1 mg at 09/25/22 7253   cloNIDine (CATAPRES) tablet 0.1 mg  0.1 mg Oral Q8H PRN Sarina Ill, DO       diphenhydrAMINE (BENADRYL) capsule 50 mg  50 mg Oral BH-q8a4p Sarina Ill, DO   50 mg at 09/25/22 6644   fluPHENAZine (PROLIXIN) tablet 5 mg  5 mg Oral BH-q8a4p Sarina Ill, DO   5 mg at 09/25/22 0907   fluticasone furoate-vilanterol (BREO ELLIPTA) 200-25 MCG/ACT 1 puff  1 puff Inhalation Daily Ardis Hughs, NP   1 puff at 09/25/22 0906   HYDROcodone-acetaminophen (NORCO) 7.5-325 MG per tablet 1 tablet  1 tablet Oral Q6H PRN Sarina Ill, DO   1 tablet at 09/17/22 2109   losartan (COZAAR) tablet 100 mg  100 mg Oral Daily Ardis Hughs, NP   100 mg at 09/25/22 0906   OLANZapine (ZYPREXA) tablet 10 mg  10 mg Oral Q6H PRN Sarina Ill, DO   10 mg at 09/18/22 1257   Or   OLANZapine (ZYPREXA) injection 10 mg  10 mg Intramuscular Q6H PRN Sarina Ill, DO   10 mg at 09/03/22 0334   traZODone (DESYREL)  tablet 100 mg  100 mg Oral QHS Sarina Ill, DO   100 mg at 09/23/22 2123   PTA Medications: Medications Prior to Admission  Medication Sig Dispense Refill Last Dose   amantadine (SYMMETREL) 100 MG capsule Take 1 capsule (100 mg total) by mouth 2 (two) times daily. 60 capsule 3    amLODipine (NORVASC) 5 MG tablet Take 1 tablet (5 mg total) by mouth daily. 30 tablet 1    atorvastatin (LIPITOR) 40 MG tablet Take 1 tablet (40 mg total) by mouth at bedtime. 30 tablet 1    carbamazepine (TEGRETOL XR) 400 MG 12 hr tablet Take 1 tablet (400 mg total) by mouth at bedtime. 30 tablet 3    divalproex (DEPAKOTE) 250 MG DR tablet Take 250 mg by mouth 3 (three) times daily.      fluticasone furoate-vilanterol (BREO ELLIPTA) 200-25 MCG/ACT AEPB Inhale 1 puff into the lungs daily. 1 each 1    losartan (COZAAR) 100 MG tablet Take 1 tablet (100 mg total) by mouth daily. 30 tablet 3    Melatonin 10 MG TABS Take 10 mg by mouth at bedtime.      QUEtiapine (SEROQUEL) 25 MG tablet Take 25 mg by mouth at bedtime.      risperiDONE (RISPERDAL) 3 MG tablet Take 1 tablet (3 mg  total) by mouth 2 (two) times daily at 8 am and 4 pm. 60 tablet 3    traZODone (DESYREL) 100 MG tablet Take 1 tablet (100 mg total) by mouth at bedtime as needed for sleep. (Patient not taking: Reported on 08/06/2022) 30 tablet 3     Patient Stressors: Health problems   Medication change or noncompliance    Patient Strengths: Active sense of humor  Motivation for treatment/growth   Treatment Modalities: Medication Management, Group therapy, Case management,  1 to 1 session with clinician, Psychoeducation, Recreational therapy.   Physician Treatment Plan for Primary Diagnosis: Schizoaffective disorder, bipolar type (HCC) Long Term Goal(s): Improvement in symptoms so as ready for discharge   Short Term Goals: Ability to identify changes in lifestyle to reduce recurrence of condition will improve Ability to verbalize feelings will  improve Ability to disclose and discuss suicidal ideas Ability to demonstrate self-control will improve Ability to identify and develop effective coping behaviors will improve Ability to maintain clinical measurements within normal limits will improve Compliance with prescribed medications will improve Ability to identify triggers associated with substance abuse/mental health issues will improve  Medication Management: Evaluate patient's response, side effects, and tolerance of medication regimen.  Therapeutic Interventions: 1 to 1 sessions, Unit Group sessions and Medication administration.  Evaluation of Outcomes: Progressing  Physician Treatment Plan for Secondary Diagnosis: Principal Problem:   Schizoaffective disorder, bipolar type (HCC)  Long Term Goal(s): Improvement in symptoms so as ready for discharge   Short Term Goals: Ability to identify changes in lifestyle to reduce recurrence of condition will improve Ability to verbalize feelings will improve Ability to disclose and discuss suicidal ideas Ability to demonstrate self-control will improve Ability to identify and develop effective coping behaviors will improve Ability to maintain clinical measurements within normal limits will improve Compliance with prescribed medications will improve Ability to identify triggers associated with substance abuse/mental health issues will improve     Medication Management: Evaluate patient's response, side effects, and tolerance of medication regimen.  Therapeutic Interventions: 1 to 1 sessions, Unit Group sessions and Medication administration.  Evaluation of Outcomes: Progressing   RN Treatment Plan for Primary Diagnosis: Schizoaffective disorder, bipolar type (HCC) Long Term Goal(s): Knowledge of disease and therapeutic regimen to maintain health will improve  Short Term Goals: Ability to remain free from injury will improve, Ability to verbalize frustration and anger  appropriately will improve, Ability to demonstrate self-control, Ability to participate in decision making will improve, Ability to verbalize feelings will improve, Ability to disclose and discuss suicidal ideas, Ability to identify and develop effective coping behaviors will improve, and Compliance with prescribed medications will improve  Medication Management: RN will administer medications as ordered by provider, will assess and evaluate patient's response and provide education to patient for prescribed medication. RN will report any adverse and/or side effects to prescribing provider.  Therapeutic Interventions: 1 on 1 counseling sessions, Psychoeducation, Medication administration, Evaluate responses to treatment, Monitor vital signs and CBGs as ordered, Perform/monitor CIWA, COWS, AIMS and Fall Risk screenings as ordered, Perform wound care treatments as ordered.  Evaluation of Outcomes: Progressing   LCSW Treatment Plan for Primary Diagnosis: Schizoaffective disorder, bipolar type (HCC) Long Term Goal(s): Safe transition to appropriate next level of care at discharge, Engage patient in therapeutic group addressing interpersonal concerns.  Short Term Goals: Engage patient in aftercare planning with referrals and resources, Increase social support, Increase ability to appropriately verbalize feelings, Increase emotional regulation, Facilitate acceptance of mental health diagnosis and concerns, Facilitate  patient progression through stages of change regarding substance use diagnoses and concerns, and Increase skills for wellness and recovery  Therapeutic Interventions: Assess for all discharge needs, 1 to 1 time with Social worker, Explore available resources and support systems, Assess for adequacy in community support network, Educate family and significant other(s) on suicide prevention, Complete Psychosocial Assessment, Interpersonal group therapy.  Evaluation of Outcomes:  Progressing   Progress in Treatment: Attending groups: Yes. Participating in groups: Yes. Taking medication as prescribed: Yes. Toleration medication: Yes. Family/Significant other contact made: Yes, individual(s) contacted:  [name], guardian Patient understands diagnosis: Yes. Discussing patient identified problems/goals with staff: Yes. Medical problems stabilized or resolved: Yes. Denies suicidal/homicidal ideation: Yes. Issues/concerns per patient self-inventory: No. Other: None   New problem(s) identified: No, Describe:  none    New Short Term/Long Term Goal(s): elimination of symptoms of psychosis, medication management for mood stabilization; elimination of SI thoughts; development of comprehensive mental wellness plan. Update 08/21/22: No changes at this time Update 08/26/22: No changes at this time Update 08/31/22: None at this time. Update 09/05/22: No changes at this time Update 09/10/22: No changes at this time. Update 09/15/22: No changes at this time Update 09/20/22: None at this time. Update 09/25/22: None at this time.      Patient Goals:  Pt unable to participate in treatment team due to active psychosis. Treatment team documents were instead sent over to legal guardian at [facility] Update 08/21/22: No changes at this time Update 08/26/22: [facility] guardianship currently working with CSW on placement options for pt. Pt remains agitated and tangential in thought and speech Update 08/31/22: None at this time. Update 09/05/22: No changes at this time Update 09/10/22: No changes at this timeUpdate 09/15/22: No changes at this time Update 09/20/22: None at this time. Update 09/25/22: None at this time.    Discharge Plan or Barriers:  CSW to assist with appropriate discharge planning Update 08/21/22: No changes at this time  Update 08/26/22: Pt has been referred to multiple housing options by guardian, but unable to be accepted due to agitated behavior and concerns regarding  pt's behavior from the facilities Update 08/31/22: None at this time. Update 09/05/22: No changes at this time Update 09/10/22: Pt's medications have been changed. Pt is less agitated and closer to baseline. [name] states he has group home placement for pt. Pt should be ready for discharge soon according to provider Update 09/15/22: No changes at this time Update 09/20/22: The patient remains a placement concern. The patient is not quite ready for discharge. Update 09/25/22: Pt recently accepted into group home. CSW currently organizing discharge with group home administrator per notes.      Reason for Continuation of Hospitalization: Delusions  Medication stabilization   Estimated Length of Stay: 1 to 7 daysUpdate 08/21/22: No changes at this time Update 08/26/22: No changes at this time Update 08/31/22: None at this time. Update 09/05/22: No changes at this time Update 09/10/22: No changes at this time Update 09/15/22: No changes at this time Update 09/20/22: None at this time. Update 09/25/22: None at this time.   Last 3 [place] Suicide Severity Risk Score: Flowsheet Row Admission (Current) from 08/10/2022 in [facility] ED from 08/06/2022 in [facility] Emergency Department at [facility] Admission (Discharged) from 05/06/2022 in [facility] BE[facility]AL MEDICINE  C-SSRS RISK CATEGORY No Risk No Risk No Risk       Last PHQ 2/9 Scores:     No  data to display          Scribe for Treatment Team: [name], Theresia Majors 09/25/2022 11:23 AM

## 2022-09-25 NOTE — Group Note (Signed)
Date:  09/25/2022 Time:  11:54 AM  Group Topic/Focus:  Managing Feelings:   The focus of this group is to identify what feelings patients have difficulty handling and develop a plan to handle them in a healthier way upon discharge.    Participation Level:  Minimal  Participation Quality:  Inattentive  Affect:  Appropriate  Cognitive:  Disorganized  Insight: None  Engagement in Group:  None  Modes of Intervention:  Activity, Clarification, and Socialization  Additional Comments:     [name] 09/25/2022, 11:54 AM
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
## 2022-09-25 NOTE — Progress Notes (Signed)
Patient present in the dayroom for breakfast.  Animated affect.  Pleasant and cooperative.  Denies SI/HI and AVH.  Denies anxiety and depression.    Compliant with scheduled medications. 15 min checks in place for safety.  Present in the milieu.  Appropriate interaction with peers. Patient is happy he has placement pending.  No behavioral issues.

## 2022-09-25 NOTE — Progress Notes (Signed)
   09/25/22 0615  15 Minute Checks  Location Bedroom  Visual Appearance Calm  Behavior Sleeping  Sleep (Behavioral Health Patients Only)  Calculate sleep? (Click Yes once per 24 hr at 0600 safety check) Yes  Documented sleep last 24 hours 14.5

## 2022-09-26 DIAGNOSIS — F250 Schizoaffective disorder, bipolar type: Secondary | ICD-10-CM | POA: Diagnosis not present

## 2022-09-26 NOTE — Progress Notes (Signed)
   09/26/22 2200  Psych Admission Type (Psych Patients Only)  Admission Status Involuntary  Psychosocial Assessment  Patient Complaints None  Eye Contact Fair  Facial Expression Animated  Affect Appropriate to circumstance  Speech Soft  Interaction Minimal  Motor Activity Slow  Appearance/Hygiene In scrubs  Behavior Characteristics Cooperative  Mood Preoccupied;Pleasant  Thought Process  Coherency Disorganized;Flight of ideas  Content Preoccupation  Delusions Grandeur  Perception Hallucinations  Hallucination Auditory  Judgment Impaired  Confusion Mild  Danger to Self  Current suicidal ideation? Denies  Self-Injurious Behavior No self-injurious ideation or behavior indicators observed or expressed   Agreement Not to Harm Self Yes  Description of Agreement verbal  Danger to Others  Danger to Others None reported or observed

## 2022-09-26 NOTE — Progress Notes (Signed)
   09/26/22 1100  Spiritual Encounters  Type of Visit Follow up  Care provided to: Patient  Conversation partners present during encounter Social worker/Care management/TOC  Referral source Chaplain assessment  Reason for visit Routine spiritual support  OnCall Visit No  Interventions  Spiritual Care Interventions Made Encouragement;Reflective listening;Compassionate presence  Intervention Outcomes  Outcomes Autonomy/agency   Chaplain met with patient in the dayroom to check on his progress. Patient shared with the chaplain that he is still really excited that he will be leaving the unit soon. 

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
## 2022-09-26 NOTE — Group Note (Signed)
Date:  09/26/2022 Time:  10:28 AM  Group Topic/Focus:  Self Care:   The focus of this group is to help patients understand the importance of self-care in order to improve or restore emotional, physical, spiritual, interpersonal, and financial health.    Participation Level:  Active  Participation Quality:  Appropriate  Affect:  Appropriate  Cognitive:  Appropriate  Insight: Appropriate  Engagement in Group:  Limited  Modes of Intervention:  Activity and Discussion  Additional Comments:  None  [name] 09/26/2022, 10:28 AM

## 2022-09-26 NOTE — BHH Counselor (Signed)
CSW contacted both [name]  and Cristal Deer of [address], in order to make sure pt's fund were dispersed for group home use.   Mr. Lonell Face stated he was unable to reach pt's payee, Cristal Deer and that he left a VM.   CSW sent Mr. Sowa pt's FL2 per his request.   CSW to follow-up Monday with both parties as pt should be discharged soon.    [name], MSW, [place] 09/26/2022 3:31 PM

## 2022-09-26 NOTE — Group Note (Signed)
[facility] LCSW Group Therapy Note   Group Date: 09/26/2022 Start Time: 1315 End Time: 1400   Type of Therapy/Topic:  Group Therapy:  Balance in Life  Participation Level:  Active   Description of Group:    This group will address the concept of balance and how it feels and looks when one is unbalanced. Patients will be encouraged to process areas in their lives that are out of balance, and identify reasons for remaining unbalanced. Facilitators will guide patients utilizing problem- solving interventions to address and correct the stressor making their life unbalanced. Understanding and applying boundaries will be explored and addressed for obtaining  and maintaining a balanced life. Patients will be encouraged to explore ways to assertively make their unbalanced needs known to significant others in their lives, using other group members and facilitator for support and feedback.  Therapeutic Goals: Patient will identify two or more emotions or situations they have that consume much of in their lives. Patient will identify signs/triggers that life has become out of balance:  Patient will identify two ways to set boundaries in order to achieve balance in their lives:  Patient will demonstrate ability to communicate their needs through discussion and/or role plays  Summary of Patient Progress:    [name] was active and appropriate throughout group. [name] showed good insight into the topic     Therapeutic Modalities:   Cognitive Behavioral Therapy Solution-Focused Therapy Assertiveness Training   [name], [place]

## 2022-09-26 NOTE — Plan of Care (Signed)
Problem: Nutrition: Goal: Adequate nutrition will be maintained Outcome: Progressing   Problem: Safety: Goal: Ability to remain free from injury will improve Outcome: Progressing   Problem: Skin Integrity: Goal: Risk for impaired skin integrity will decrease Outcome: Progressing

## 2022-09-26 NOTE — BHH Counselor (Signed)
CSW received call from [place] at [employer].   CSW merged calls with [name], group home administrator.   Cristal Deer states pt has no funds for September because it was paid out to the other group home because pt's legal guardian did not make him aware that pt was in the hospital.   [name] states that he is glad payee touched base with him and states that he will contact CSW next week to figure out what the arrangement for Mr. Windholz will be.   Cristal Deer states he can pay for October.   Clement verbalizes understanding.    [name], MSW, [place] 09/26/2022 3:48 PM

## 2022-09-26 NOTE — Plan of Care (Signed)
  Problem: Education: Goal: Knowledge of General Education information will improve Description: Including pain rating scale, medication(s)/side effects and non-pharmacologic comfort measures Outcome: Progressing   Problem: Health Behavior/Discharge Planning: Goal: Ability to manage health-related needs will improve Outcome: Progressing   Problem: Clinical Measurements: Goal: Ability to maintain clinical measurements within normal limits will improve Outcome: Progressing Goal: Cardiovascular complication will be avoided Outcome: Progressing   Problem: Nutrition: Goal: Adequate nutrition will be maintained Outcome: Progressing   Problem: Coping: Goal: Level of anxiety will decrease Outcome: Progressing   Problem: Elimination: Goal: Will not experience complications related to bowel motility Outcome: Progressing   Problem: Safety: Goal: Ability to remain free from injury will improve Outcome: Progressing   Problem: Skin Integrity: Goal: Risk for impaired skin integrity will decrease Outcome: Progressing

## 2022-09-26 NOTE — Progress Notes (Signed)
Patient admitted IVC on August 10, 2022 for medication non adherence and reports of aggressive behavior at his group home. Diagnosis of schizoaffective disorder.   Patient denies SI/HI/AVH. He denies anxiety and depression. No PRN's adm. Patient mainly isolated in his room but did appear for meals, snacks, and groups. Medication adm whole without difficulty.  Discharge pending.  Q15 minute unit checks in place.

## 2022-09-26 NOTE — Group Note (Signed)
Date:  09/26/2022 Time:  8:22 PM  Group Topic/Focus:  Managing Feelings:   The focus of this group is to identify what feelings patients have difficulty handling and develop a plan to handle them in a healthier way upon discharge.    Participation Level:  Active  Participation Quality:  Appropriate  Affect:  Appropriate  Cognitive:  Appropriate  Insight: Appropriate  Engagement in Group:  Engaged  Modes of Intervention:  Discussion  Additional Comments:    Burt Ek 09/26/2022, 8:22 PM

## 2022-09-27 DIAGNOSIS — F250 Schizoaffective disorder, bipolar type: Secondary | ICD-10-CM | POA: Diagnosis not present

## 2022-09-27 NOTE — Group Note (Signed)
Date:  09/27/2022 Time:  8:26 PM  Group Topic/Focus:  Personal Choices and Values:   The focus of this group is to help patients assess and explore the importance of values in their lives, how their values affect their decisions, how they express their values and what opposes their expression.    Participation Level:  Active  Participation Quality:  Appropriate  Affect:  Appropriate  Cognitive:  Appropriate  Insight: Appropriate  Engagement in Group:  Engaged  Modes of Intervention:  Discussion  Additional Comments:    Burt Ek 09/27/2022, 8:26 PM

## 2022-09-27 NOTE — Progress Notes (Signed)
Patient admitted on August 10, 2022 IVC for medication non adherence at his group home and reports of aggressive behavior there. Diagnosis of schizoaffective disorder, bipolar type.  He denies SI/HI/AVH. No distress noted or voiced. Medication adherent.  Discharge expected in the coming days.  Q15 minute unit checks in place.

## 2022-09-27 NOTE — Group Note (Signed)
Date:  09/27/2022 Time:  12:20 PM  Group Topic/Focus:  Coping With Mental Health Crisis:   The purpose of this group is to help patients identify strategies for coping with mental health crisis.  Group discusses possible causes of crisis and ways to manage them effectively. Crisis Planning:   The purpose of this group is to help patients create a crisis plan for use upon discharge or in the future, as needed. Overcoming Stress:   The focus of this group is to define stress and help patients assess their triggers. Anger Management   Participation Level:  Active  Participation Quality:  Appropriate  Affect:  Appropriate  Cognitive:  Appropriate  Insight: Appropriate  Engagement in Group:  Engaged and Improving  Modes of Intervention:  Discussion and Education  Additional Comments:    Wanda Rideout L Taelon Bendorf 09/27/2022, 12:20 PM

## 2022-09-27 NOTE — Plan of Care (Signed)
Problem: Nutrition: Goal: Adequate nutrition will be maintained Outcome: Progressing   Problem: Coping: Goal: Level of anxiety will decrease Outcome: Progressing   Problem: Safety: Goal: Ability to remain free from injury will improve Outcome: Progressing   Problem: Skin Integrity: Goal: Risk for impaired skin integrity will decrease Outcome: Progressing
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
## 2022-09-27 NOTE — Progress Notes (Signed)
   09/27/22 2100  Psych Admission Type (Psych Patients Only)  Admission Status Involuntary  Psychosocial Assessment  Patient Complaints None  Eye Contact Fair  Facial Expression Flat  Affect Appropriate to circumstance  Speech Soft  Interaction Assertive  Motor Activity Slow  Appearance/Hygiene In scrubs  Behavior Characteristics Cooperative  Mood Preoccupied;Pleasant  Thought Process  Coherency Disorganized  Content Preoccupation  Delusions Grandeur  Perception Hallucinations  Hallucination Auditory  Judgment Impaired  Confusion Mild  Danger to Self  Current suicidal ideation? Denies  Self-Injurious Behavior No self-injurious ideation or behavior indicators observed or expressed   Agreement Not to Harm Self Yes  Description of Agreement verbal  Danger to Others  Danger to Others None reported or observed

## 2022-09-27 NOTE — Progress Notes (Signed)
   09/27/22 0601  15 Minute Checks  Location Bedroom  Visual Appearance Calm  Behavior Sleeping  Sleep (Behavioral Health Patients Only)  Calculate sleep? (Click Yes once per 24 hr at 0600 safety check) Yes  Documented sleep last 24 hours 13.75

## 2022-09-27 NOTE — Group Note (Signed)
Date:  09/27/2022 Time:  5:17 PM  Group Topic/Focus:  Coping With Mental Health Crisis:   The purpose of this group is to help patients identify strategies for coping with mental health crisis.  Group discusses possible causes of crisis and ways to manage them effectively.  The pt went outside for some fresh air and talked about coping mechanisms and positive thoughts.    Participation Level:  Active  Participation Quality:  Appropriate  Affect:  Appropriate  Cognitive:  Appropriate  Insight: Appropriate  Engagement in Group:  Engaged and Improving  Modes of Intervention:  Discussion  Additional Comments:    Vanna Shavers L Mckaylin Bastien 09/27/2022, 5:17 PM

## 2022-09-28 DIAGNOSIS — F250 Schizoaffective disorder, bipolar type: Secondary | ICD-10-CM | POA: Diagnosis not present

## 2022-09-28 NOTE — Group Note (Signed)
Date:  09/28/2022 Time:  11:48 PM  Group Topic/Focus:  Making Healthy Choices:   The focus of this group is to help patients identify negative/unhealthy choices they were using prior to admission and identify positive/healthier coping strategies to replace them upon discharge.    Participation Level:  Active  Participation Quality:  Appropriate  Affect:  Appropriate  Cognitive:  Appropriate  Insight: Improving  Engagement in Group:  Improving  Modes of Intervention:  Discussion  Additional Comments:    [name] 09/28/2022, 11:48 PM

## 2022-09-28 NOTE — Progress Notes (Signed)
igned      Patient is alert and oriented. Pt denies any pain , Anxiety and depression as well as  SI/AVH at this time.    Pt is compliant with meds per MD orders. Pt is calm and cooperative. Pt is pleasant with staff and peers, came out for snack time and   joined  group. Routine safety checks conducted  q15 minutes.    No adverse drug reactions observed . Pt verbally contracts for safety at this time. Pt is compliant with medications and treatment plan . Pt interacts well with others on the unit. Pt remains safe at this time . Plan of care ongoing.

## 2022-09-28 NOTE — Progress Notes (Signed)
Patient is an involuntary admission to [facility] for SAD bipolar type. Patient has been calm and cooperative, friendly with staff and peers. Easily redirected. Took all his medications whole. Ambulates without assistance. Performs his own ADL's. Denies SI/HI/AVH/Depression/Anxiety. Internal stimulation not noted today. Will continue to monitor.

## 2022-09-28 NOTE — Group Note (Signed)
Date:  09/28/2022 Time:  11:23 AM  Group Topic/Focus:  Making Healthy Choices:   The focus of this group is to help patients identify negative/unhealthy choices they were using prior to admission and identify positive/healthier coping strategies to replace them upon discharge. Managing Feelings:   The focus of this group is to identify what feelings patients have difficulty handling and develop a plan to handle them in a healthier way upon discharge. Overcoming Stress:   The focus of this group is to define stress and help patients assess their triggers. Personal Choices and Values:   The focus of this group is to help patients assess and explore the importance of values in their lives, how their values affect their decisions, how they express their values and what opposes their expression. Self Care:   The focus of this group is to help patients understand the importance of self-care in order to improve or restore emotional, physical, spiritual, interpersonal, and financial health.    Participation Level:  Active  Participation Quality:  Appropriate  Affect:  Flat  Cognitive:  Alert  Insight: Appropriate  Engagement in Group:  Supportive  Modes of Intervention:  Activity, Socialization, and Support  Additional Comments:  Patient participated appropriately in group discussion and activity (outdoor bowling)  Daylon Lafavor l Mitzie Na 09/28/2022, 11:23 AM
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
## 2022-09-28 NOTE — Plan of Care (Signed)
Problem: Education: Goal: Knowledge of General Education information will improve Description: Including pain rating scale, medication(s)/side effects and non-pharmacologic comfort measures Outcome: Progressing   Problem: Health Behavior/Discharge Planning: Goal: Ability to manage health-related needs will improve Outcome: Progressing   Problem: Clinical Measurements: Goal: Ability to maintain clinical measurements within normal limits will improve Outcome: Progressing Goal: Cardiovascular complication will be avoided Outcome: Progressing   Problem: Nutrition: Goal: Adequate nutrition will be maintained Outcome: Progressing   Problem: Coping: Goal: Level of anxiety will decrease Outcome: Progressing   Problem: Elimination: Goal: Will not experience complications related to bowel motility Outcome: Progressing   Problem: Safety: Goal: Ability to remain free from injury will improve Outcome: Progressing   Problem: Skin Integrity: Goal: Risk for impaired skin integrity will decrease Outcome: Progressing

## 2022-09-28 NOTE — Progress Notes (Signed)
   09/28/22 7829  15 Minute Checks  Location Bedroom  Visual Appearance Calm  Behavior Sleeping  Sleep (Behavioral Health Patients Only)  Calculate sleep? (Click Yes once per 24 hr at 0600 safety check) Yes  Documented sleep last 24 hours 14.75

## 2022-09-28 NOTE — Group Note (Signed)
LCSW Group Therapy Note   Group Date: 09/28/2022 Start Time: 1308 End Time: 1340   Type of Therapy and Topic:  Group Therapy: Self-care Vs. Coping Skills  Participation Level:  Minimal      Summary of Patient Progress:  The patient attended group. Patient proved open to input from peers and feedback from [facility]. The patient participated during today's icebreaker.    Marshell Levan, LCSWA 09/28/2022  1:55 PM

## 2022-09-29 DIAGNOSIS — F250 Schizoaffective disorder, bipolar type: Secondary | ICD-10-CM | POA: Diagnosis not present
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
## 2022-09-29 NOTE — Plan of Care (Signed)
  Problem: Coping: Goal: Level of anxiety will decrease Outcome: Progressing   Problem: Safety: Goal: Ability to remain free from injury will improve Outcome: Progressing   Problem: Skin Integrity: Goal: Risk for impaired skin integrity will decrease Outcome: Progressing

## 2022-09-29 NOTE — Group Note (Signed)
Date:  09/29/2022 Time:  12:25 PM  Group Topic/Focus:  Emotional Education:   The focus of this group is to discuss what feelings/emotions are, and how they are experienced.    Participation Level:  Active  Participation Quality:  Appropriate  Affect:  Appropriate  Cognitive:  Alert  Insight: Appropriate  Engagement in Group:  Engaged  Modes of Intervention:  Education  Additional Comments:    Ardelle Anton 09/29/2022, 12:25 PM

## 2022-09-29 NOTE — Group Note (Signed)
Recreation Therapy Group Note   Group Topic:Communication  Group Date: 09/29/2022 Start Time: 1400 End Time: 1450 Facilitators: Rosina Lowenstein, LRT, CTRS Location: Courtyard  Group Description: Emotional Check in. Patient sat and talked with LRT about how they are doing and whatever else is on their mind. LRT provided active listening, reassurance and encouragement. Pts were given the opportunity to listen to music or play cornhole while getting fresh air and sunlight in the courtyard.    Goal Area(s) Addressed: Patient will engage in conversation with LRT. Patient will communicate their wants, needs, or questions.  Patient will practice a new coping skill of "talking to someone".   Affect/Mood: Appropriate   Participation Level: Active and Engaged   Participation Quality: Independent   Behavior: Appropriate, Calm, and Cooperative   Speech/Thought Process: Loose association   Insight: Fair   Judgement: Good   Modes of Intervention: Open Conversation   Patient Response to Interventions:  Attentive, Engaged, Interested , and Receptive   Education Outcome:  Acknowledges education   Clinical Observations/Individualized Feedback: Hakeem was active in their participation of session activities and group discussion. Pt identified "I want to get a jeep and go [place] to the mountains". Pt had a bright affect while interacting well with LRT and peers duration of session.   Plan: Continue to engage patient in RT group sessions 2-3x/week.   [address], LRT, CTRS 09/29/2022 3:22 PM

## 2022-09-29 NOTE — Progress Notes (Signed)
Patient calm and cooperative.  Flat affect.  Present in the dayroom for breakfast.  Reports he slept well. Good appetite. Denies SI/HI and AVH. Denies anxiety and depression.    Compliant with scheduled medications.  15 min checks in place for safety.  Present in the milieu.  Appropriate interaction with peers and staff.  Patient attended MHT, RT and SW groups. No behavioral issues.

## 2022-09-29 NOTE — Group Note (Deleted)
Date:  09/29/2022 Time:  11:59 AM  Group Topic/Focus:  Emotional Education:   The focus of this group is to discuss what feelings/emotions are, and how they are experienced. Wellness Toolbox:   The focus of this group is to discuss various aspects of wellness, balancing those aspects and exploring ways to increase the ability to experience wellness.  Patients will create a wellness toolbox for use upon discharge.     Participation Level:  {BHH PARTICIPATION ZOXWR:60454}  Participation Quality:  {BHH PARTICIPATION QUALITY:22265}  Affect:  {BHH AFFECT:22266}  Cognitive:  {BHH COGNITIVE:22267}  Insight: {BHH Insight2:20797}  Engagement in Group:  {BHH ENGAGEMENT IN UJWJX:91478}  Modes of Intervention:  {BHH MODES OF INTERVENTION:22269}  Additional Comments:  ***  Ardelle Anton 09/29/2022, 11:59 AM

## 2022-09-29 NOTE — Group Note (Signed)
Date:  09/29/2022 Time:  8:38 PM  Group Topic/Focus:  Healthy Communication:   The focus of this group is to discuss communication, barriers to communication, as well as healthy ways to communicate with others.    Participation Level:  Did Not Attend  Participation Quality:   Did Not Attend  Affect:   Did Not Attend  Cognitive:   Did Not Attend  Insight: None  Engagement in Group:  None  Modes of Intervention:   Did Not Attend  Additional Comments:    [name] 09/29/2022, 8:38 PM

## 2022-09-29 NOTE — Group Note (Signed)
[facility] LCSW Group Therapy Note   Group Date: 09/29/2022 Start Time: 1315 End Time: 1400  Type of Therapy/Topic:  Group Therapy:  Feelings about Diagnosis  Participation Level:  Active   Mood: Calm, Happy    Description of Group:    This group will allow patients to explore their thoughts and feelings about diagnoses they have received. Patients will be guided to explore their level of understanding and acceptance of these diagnoses. Facilitator will encourage patients to process their thoughts and feelings about the reactions of others to their diagnosis, and will guide patients in identifying ways to discuss their diagnosis with significant others in their lives. This group will be process-oriented, with patients participating in exploration of their own experiences as well as giving and receiving support and challenge from other group members.   Therapeutic Goals: 1. Patient will demonstrate understanding of diagnosis as evidence by identifying two or more symptoms of the disorder:  2. Patient will be able to express two feelings regarding the diagnosis 3. Patient will demonstrate ability to communicate their needs through discussion and/or role plays  Summary of Patient Progress:    Lorenz participated throughout the entirety of group and showed fair insight into the subject matter     Therapeutic Modalities:   Cognitive Behavioral Therapy Brief Therapy Feelings Identification    [name], LCSWA

## 2022-09-29 NOTE — BHH Counselor (Signed)
CSW contacted [name] to discuss discharge date of pt  [name] states that he would need a letter of guarantee to cover the days that were not covered by [facility] SSI check.   CSW contacted SW leadership in order to secure LOG.   CSW awaiting word from leadership.    [name], MSW, [place] 09/29/2022 2:23 PM

## 2022-09-30 DIAGNOSIS — F250 Schizoaffective disorder, bipolar type: Secondary | ICD-10-CM | POA: Diagnosis not present

## 2022-09-30 NOTE — Progress Notes (Signed)
Patient calm and cooperative.  Present in the dayroom for breakfast.  Denies SI/HI and AVH. Denies anxiety or depression.  Denies pain.    Compliant with scheduled medications. 15 min checks in place for safety.  Patient returned to bed after breakfast.  Present in the milieu throughout the shift.  Appropriate interaction with peers and staff.  No behavioral issues.
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
## 2022-09-30 NOTE — Plan of Care (Signed)
  Problem: Nutrition: Goal: Adequate nutrition will be maintained Outcome: Progressing   Problem: Coping: Goal: Level of anxiety will decrease Outcome: Progressing   Problem: Safety: Goal: Ability to remain free from injury will improve Outcome: Progressing

## 2022-09-30 NOTE — Progress Notes (Signed)
   09/30/22 2200  Psych Admission Type (Psych Patients Only)  Admission Status Involuntary  Psychosocial Assessment  Patient Complaints None  Eye Contact Fair  Facial Expression Flat  Affect Appropriate to circumstance  Speech Soft;Slurred  Interaction Minimal  Motor Activity Slow  Appearance/Hygiene Disheveled  Behavior Characteristics Cooperative;Appropriate to situation  Mood Pleasant  Thought Chartered certified accountant of ideas  Content Preoccupation  Delusions Grandeur  Perception Hallucinations  Hallucination Auditory  Judgment Limited  Confusion Mild  Danger to Self  Current suicidal ideation? Denies  Self-Injurious Behavior No self-injurious ideation or behavior indicators observed or expressed   Agreement Not to Harm Self Yes  Description of Agreement verbal  Danger to Others  Danger to Others None reported or observed

## 2022-09-30 NOTE — Progress Notes (Signed)

## 2022-09-30 NOTE — BH IP Treatment Plan (Signed)
Interdisciplinary Treatment and Diagnostic Plan Update  09/30/2022 Time of Session: 9:30 AM  [name] MRN: [id number]  Principal Diagnosis: Schizoaffective disorder, bipolar type (HCC)  Secondary Diagnoses: Principal Problem:   Schizoaffective disorder, bipolar type (HCC)   Current Medications:  Current Facility-Administered Medications  Medication Dose Route Frequency Provider Last Rate Last Admin   amLODipine (NORVASC) tablet 10 mg  10 mg Oral Daily Sarina Ill, DO   10 mg at 09/30/22 4742   atorvastatin (LIPITOR) tablet 40 mg  40 mg Oral QHS Ardis Hughs, NP   40 mg at 09/29/22 2111   carbamazepine (TEGRETOL XR) 12 hr tablet 200 mg  200 mg Oral BID Sarina Ill, DO   200 mg at 09/30/22 5956   chlorproMAZINE (THORAZINE) tablet 200 mg  200 mg Oral QHS Sarina Ill, DO   200 mg at 09/29/22 2111   clonazePAM (KLONOPIN) tablet 1 mg  1 mg Oral BID Lewanda Rife, MD   1 mg at 09/30/22 3875   cloNIDine (CATAPRES) tablet 0.1 mg  0.1 mg Oral Q8H PRN Sarina Ill, DO       diphenhydrAMINE (BENADRYL) capsule 50 mg  50 mg Oral BH-q8a4p Sarina Ill, DO   50 mg at 09/30/22 6433   fluPHENAZine (PROLIXIN) tablet 5 mg  5 mg Oral BH-q8a4p Sarina Ill, DO   5 mg at 09/30/22 0937   fluticasone furoate-vilanterol (BREO ELLIPTA) 200-25 MCG/ACT 1 puff  1 puff Inhalation Daily Ardis Hughs, NP   1 puff at 09/30/22 0939   HYDROcodone-acetaminophen (NORCO) 7.5-325 MG per tablet 1 tablet  1 tablet Oral Q6H PRN Sarina Ill, DO   1 tablet at 09/17/22 2109   losartan (COZAAR) tablet 100 mg  100 mg Oral Daily Ardis Hughs, NP   100 mg at 09/30/22 0936   OLANZapine (ZYPREXA) tablet 10 mg  10 mg Oral Q6H PRN Sarina Ill, DO   10 mg at 09/18/22 1257   Or   OLANZapine (ZYPREXA) injection 10 mg  10 mg Intramuscular Q6H PRN Sarina Ill, DO   10 mg at 09/03/22 0334   traZODone (DESYREL)  tablet 100 mg  100 mg Oral QHS Sarina Ill, DO   100 mg at 09/29/22 2112   PTA Medications: Medications Prior to Admission  Medication Sig Dispense Refill Last Dose   amantadine (SYMMETREL) 100 MG capsule Take 1 capsule (100 mg total) by mouth 2 (two) times daily. 60 capsule 3    amLODipine (NORVASC) 5 MG tablet Take 1 tablet (5 mg total) by mouth daily. 30 tablet 1    atorvastatin (LIPITOR) 40 MG tablet Take 1 tablet (40 mg total) by mouth at bedtime. 30 tablet 1    carbamazepine (TEGRETOL XR) 400 MG 12 hr tablet Take 1 tablet (400 mg total) by mouth at bedtime. 30 tablet 3    divalproex (DEPAKOTE) 250 MG DR tablet Take 250 mg by mouth 3 (three) times daily.      fluticasone furoate-vilanterol (BREO ELLIPTA) 200-25 MCG/ACT AEPB Inhale 1 puff into the lungs daily. 1 each 1    losartan (COZAAR) 100 MG tablet Take 1 tablet (100 mg total) by mouth daily. 30 tablet 3    Melatonin 10 MG TABS Take 10 mg by mouth at bedtime.      QUEtiapine (SEROQUEL) 25 MG tablet Take 25 mg by mouth at bedtime.      risperiDONE (RISPERDAL) 3 MG tablet Take 1 tablet (3 mg  total) by mouth 2 (two) times daily at 8 am and 4 pm. 60 tablet 3    traZODone (DESYREL) 100 MG tablet Take 1 tablet (100 mg total) by mouth at bedtime as needed for sleep. (Patient not taking: Reported on 08/06/2022) 30 tablet 3     Patient Stressors: Health problems   Medication change or noncompliance    Patient Strengths: Active sense of humor  Motivation for treatment/growth   Treatment Modalities: Medication Management, Group therapy, Case management,  1 to 1 session with clinician, Psychoeducation, Recreational therapy.   Physician Treatment Plan for Primary Diagnosis: Schizoaffective disorder, bipolar type (HCC) Long Term Goal(s): Improvement in symptoms so as ready for discharge   Short Term Goals: Ability to identify changes in lifestyle to reduce recurrence of condition will improve Ability to verbalize feelings will  improve Ability to disclose and discuss suicidal ideas Ability to demonstrate self-control will improve Ability to identify and develop effective coping behaviors will improve Ability to maintain clinical measurements within normal limits will improve Compliance with prescribed medications will improve Ability to identify triggers associated with substance abuse/mental health issues will improve  Medication Management: Evaluate patient's response, side effects, and tolerance of medication regimen.  Therapeutic Interventions: 1 to 1 sessions, Unit Group sessions and Medication administration.  Evaluation of Outcomes: Progressing  Physician Treatment Plan for Secondary Diagnosis: Principal Problem:   Schizoaffective disorder, bipolar type (HCC)  Long Term Goal(s): Improvement in symptoms so as ready for discharge   Short Term Goals: Ability to identify changes in lifestyle to reduce recurrence of condition will improve Ability to verbalize feelings will improve Ability to disclose and discuss suicidal ideas Ability to demonstrate self-control will improve Ability to identify and develop effective coping behaviors will improve Ability to maintain clinical measurements within normal limits will improve Compliance with prescribed medications will improve Ability to identify triggers associated with substance abuse/mental health issues will improve     Medication Management: Evaluate patient's response, side effects, and tolerance of medication regimen.  Therapeutic Interventions: 1 to 1 sessions, Unit Group sessions and Medication administration.  Evaluation of Outcomes: Progressing   RN Treatment Plan for Primary Diagnosis: Schizoaffective disorder, bipolar type (HCC) Long Term Goal(s): Knowledge of disease and therapeutic regimen to maintain health will improve  Short Term Goals: Ability to remain free from injury will improve, Ability to verbalize frustration and anger  appropriately will improve, Ability to demonstrate self-control, Ability to participate in decision making will improve, Ability to verbalize feelings will improve, Ability to disclose and discuss suicidal ideas, Ability to identify and develop effective coping behaviors will improve, and Compliance with prescribed medications will improve  Medication Management: RN will administer medications as ordered by provider, will assess and evaluate patient's response and provide education to patient for prescribed medication. RN will report any adverse and/or side effects to prescribing provider.  Therapeutic Interventions: 1 on 1 counseling sessions, Psychoeducation, Medication administration, Evaluate responses to treatment, Monitor vital signs and CBGs as ordered, Perform/monitor CIWA, COWS, AIMS and Fall Risk screenings as ordered, Perform wound care treatments as ordered.  Evaluation of Outcomes: Progressing   LCSW Treatment Plan for Primary Diagnosis: Schizoaffective disorder, bipolar type (HCC) Long Term Goal(s): Safe transition to appropriate next level of care at discharge, Engage patient in therapeutic group addressing interpersonal concerns.  Short Term Goals: Engage patient in aftercare planning with referrals and resources, Increase social support, Increase ability to appropriately verbalize feelings, Increase emotional regulation, Facilitate acceptance of mental health diagnosis and concerns, Facilitate  patient progression through stages of change regarding substance use diagnoses and concerns, Identify triggers associated with mental health/substance abuse issues, and Increase skills for wellness and recovery  Therapeutic Interventions: Assess for all discharge needs, 1 to 1 time with Social worker, Explore available resources and support systems, Assess for adequacy in community support network, Educate family and significant other(s) on suicide prevention, Complete Psychosocial Assessment,  Interpersonal group therapy.  Evaluation of Outcomes: Progressing   Progress in Treatment: Attending groups: Yes. Participating in groups: Yes. Taking medication as prescribed: Yes. Toleration medication: Yes. Family/Significant other contact made: Yes, individual(s) contacted:  [name], guardian Patient understands diagnosis: Yes. Discussing patient identified problems/goals with staff: Yes. Medical problems stabilized or resolved: Yes. Denies suicidal/homicidal ideation: Yes. Issues/concerns per patient self-inventory: No. Other: None   New problem(s) identified: No, Describe:  none    New Short Term/Long Term Goal(s): elimination of symptoms of psychosis, medication management for mood stabilization; elimination of SI thoughts; development of comprehensive mental wellness plan. Update 08/21/22: No changes at this time Update 08/26/22: No changes at this time Update 08/31/22: None at this time. Update 09/05/22: No changes at this time Update 09/10/22: No changes at this time. Update 09/15/22: No changes at this time Update 09/20/22: None at this time. Update 09/25/22: None at this time. Update 09/30/22: None at this time.      Patient Goals:  Pt unable to participate in treatment team due to active psychosis. Treatment team documents were instead sent over to legal guardian at [facility] Update 08/21/22: No changes at this time Update 08/26/22: [facility] guardianship currently working with CSW on placement options for pt. Pt remains agitated and tangential in thought and speech Update 08/31/22: None at this time. Update 09/05/22: No changes at this time Update 09/10/22: No changes at this timeUpdate 09/15/22: No changes at this time Update 09/20/22: None at this time. Update 09/25/22: None at this time. Update 09/30/22: None at this time.      Discharge Plan or Barriers:  CSW to assist with appropriate discharge planning Update 08/21/22: No changes at this time  Update 08/26/22: Pt has  been referred to multiple housing options by guardian, but unable to be accepted due to agitated behavior and concerns regarding pt's behavior from the facilities Update 08/31/22: None at this time. Update 09/05/22: No changes at this time Update 09/10/22: Pt's medications have been changed. Pt is less agitated and closer to baseline. [name] states he has group home placement for pt. Pt should be ready for discharge soon according to provider Update 09/15/22: No changes at this time Update 09/20/22: The patient remains a placement concern. The patient is not quite ready for discharge. Update 09/25/22: Pt recently accepted into group home. CSW currently organizing discharge with group home administrator per notes. Update 09/30/22: CSW connected with leadership to get pt an LOG. Pt to be picked up by group home administrator any day now, once LOG goes through.        Reason for Continuation of Hospitalization: Delusions  Medication stabilization   Estimated Length of Stay: 1 to 7 daysUpdate 08/21/22: No changes at this time Update 08/26/22: No changes at this time Update 08/31/22: None at this time. Update 09/05/22: No changes at this time Update 09/10/22: No changes at this time Update 09/15/22: No changes at this time Update 09/20/22: None at this time. Update 09/25/22: None at this time. Update 09/30/22: None at this time.     Last 3 [place] Suicide Severity  Risk Score: Flowsheet Row Admission (Current) from 08[facility]10/2022 in [facility] ED from 08/06/2022 in [facility] Emergency Department at [facility] Admission (Discharged) from 05/06/2022 in [facility] BEHAVIORAL MEDICINE  C-SSRS RISK CATEGORY No Risk No Risk No Risk       Last PHQ 2/9 Scores:     No data to display          Scribe for Treatment Team: [name], Theresia Majors 09/30/2022 2:06 PM

## 2022-09-30 NOTE — Group Note (Signed)
Recreation Therapy Group Note   Group Topic:Health and Wellness  Group Date: 09/30/2022 Start Time: 1400 End Time: 1445 Facilitators: Rosina Lowenstein, LRT, CTRS Location:  Dayroom  Group Description: Seated Exercise. LRT discussed the mental and physical benefits of exercise. LRT and group discussed how physical activity can be used as a coping skill. Pt's and LRT followed along to an exercise video on the TV screen that provided a visual representation and audio description of every exercise performed. Pt's encouraged to listen to their bodies and stop at any time if they experience feelings of discomfort or pain. LRT passed out water after session was over and encouraged pts do drink and stay hydrated.  Goal Area(s) Addressed: Patient will learn benefits of physical activity. Patient will identify exercise as a coping skill.  Patient will follow multistep directions. Patient will try a new leisure interest.    Affect/Mood: Appropriate   Participation Level: Active and Engaged   Participation Quality: Independent   Behavior: Appropriate, Calm, and Cooperative   Speech/Thought Process: Coherent   Insight: Fair   Judgement: Good   Modes of Intervention: Activity   Patient Response to Interventions:  Attentive and Receptive   Education Outcome:  Acknowledges education   Clinical Observations/Individualized Feedback: Rocko was active in their participation of session activities and group discussion. Pt completed exercises all appropriately. Pt interacted well with LRT and peers duration of session.  Plan: Continue to engage patient in RT group sessions 2-3x/week.   Rosina Lowenstein, LRT, CTRS 09/30/2022 2:55 PM

## 2022-09-30 NOTE — Group Note (Signed)
Date:  09/30/2022 Time:  10:55 AM  Group Topic/Focus:  Gratitude Group The purpose of this group was to identity the meaning of gratitude following with a learning video about gratitude and being thankful for the people in our life. Lastly writing a letter to whomever they were thankful for.    Participation Level:  Active  Participation Quality:  Appropriate  Affect:  Appropriate  Cognitive:  Appropriate  Insight: Appropriate  Engagement in Group:  Engaged and Improving  Modes of Intervention:  Activity and Discussion  Additional Comments:    Marta Antu 09/30/2022, 10:55 AM

## 2022-10-01 DIAGNOSIS — F250 Schizoaffective disorder, bipolar type: Secondary | ICD-10-CM | POA: Diagnosis not present

## 2022-10-01 MED ORDER — LOSARTAN POTASSIUM 100 MG PO TABS: 100.0000 mg | ORAL_TABLET | Freq: Every day | ORAL | 3 refills | Status: AC

## 2022-10-01 MED ORDER — ATORVASTATIN CALCIUM 40 MG PO TABS: 40.0000 mg | ORAL_TABLET | Freq: Every day | ORAL | 3 refills | Status: AC

## 2022-10-01 MED ORDER — TRAZODONE HCL 100 MG PO TABS: 100.0000 mg | ORAL_TABLET | Freq: Every day | ORAL | 3 refills | Status: DC

## 2022-10-01 MED ORDER — AMLODIPINE BESYLATE 10 MG PO TABS: 10.0000 mg | ORAL_TABLET | Freq: Every day | ORAL | 3 refills | Status: AC

## 2022-10-01 MED ORDER — CLONAZEPAM 1 MG PO TABS: 1.0000 mg | ORAL_TABLET | Freq: Two times a day (BID) | ORAL | 3 refills | Status: DC

## 2022-10-01 MED ORDER — FLUPHENAZINE HCL 5 MG PO TABS: 5.0000 mg | ORAL_TABLET | ORAL | 3 refills | Status: DC

## 2022-10-01 MED ORDER — DIPHENHYDRAMINE HCL 50 MG PO CAPS: 50.0000 mg | ORAL_CAPSULE | ORAL | 3 refills | Status: DC

## 2022-10-01 MED ORDER — CARBAMAZEPINE ER 200 MG PO TB12: 200.0000 mg | ORAL_TABLET | Freq: Two times a day (BID) | ORAL | 3 refills | Status: DC

## 2022-10-01 MED ORDER — CHLORPROMAZINE HCL 200 MG PO TABS: 200.0000 mg | ORAL_TABLET | Freq: Every day | ORAL | 3 refills | Status: DC
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
## 2022-10-01 NOTE — Group Note (Signed)
Date:  10/01/2022 Time:  5:01 PM  Group Topic/Focus:  Outside Rec/Music Therapy  The purpose of this group is to allow patients to get fresh air while listening to soothing music     Participation Level:  Active  Participation Quality:  Appropriate  Affect:  Appropriate  Cognitive:  Appropriate  Insight: Appropriate  Engagement in Group:  Engaged  Modes of Intervention:  Activity  Additional Comments:    Marta Antu 10/01/2022, 5:01 PM

## 2022-10-01 NOTE — BHH Counselor (Signed)
CSW faxed printed prescriptions to [facility], [place] [facility] [phone])  [name], MSW, [facility] 10/01/2022 2:42 PM

## 2022-10-01 NOTE — BHH Suicide Risk Assessment (Signed)
[facility] Discharge Suicide Risk Assessment   Principal Problem: Schizoaffective disorder, bipolar type [facility]) Discharge Diagnoses: Principal Problem:   Schizoaffective disorder, bipolar type (HCC)   Total Time spent with patient: 1 hour  Musculoskeletal: Strength & Muscle Tone: within normal limits Gait & Station: normal Patient leans: N/A  Psychiatric Specialty Exam  Presentation  General Appearance:  Casual  Eye Contact: Fair  Speech: Garbled; Normal Rate  Speech Volume: Normal  Handedness: Right   Mood and Affect  Mood: Euthymic  Duration of Depression Symptoms: No data recorded Affect: Congruent   Thought Process  Thought Processes: Disorganized  Descriptions of Associations:Loose  Orientation:Full (Time, Place and Person)  Thought Content:Scattered; Delusions; Illogical  History of Schizophrenia/Schizoaffective disorder:Yes  Duration of Psychotic Symptoms:Greater than six months  Hallucinations:No data recorded Ideas of Reference:None  Suicidal Thoughts:No data recorded Homicidal Thoughts:No data recorded  Sensorium  Memory: Recent Poor; Remote Poor; Immediate Poor  Judgment: Poor  Insight: Poor   Executive Functions  Concentration: Fair  Attention Span: Fair  Recall: Poor  Fund of Knowledge: Poor  Language: Fair   Psychomotor Activity  Psychomotor Activity:No data recorded  Assets  Assets: Physical Health; Resilience; Financial Resources/Insurance   Sleep  Sleep:No data recorded   Blood pressure 118/66, pulse 64, temperature 98.8 F (37.1 C), resp. rate 16, height 5\' 7"  (1.702 m), weight 65.5 kg, SpO2 94%. Body mass index is 22.63 kg/m.  Mental Status Per Nursing Assessment::   On Admission:  NA  Demographic Factors:  Male  Loss Factors: NA  Historical Factors: NA  Risk Reduction Factors:   NA    Cognitive Features That Contribute To Risk:  None    Suicide Risk:  Minimal: No identifiable  suicidal ideation.  Patients presenting with no risk factors but with morbid ruminations; may be classified as minimal risk based on the severity of the depressive symptoms    Plan Of Care/Follow-up recommendations: See SW Note   Sarina Ill, DO 10/01/2022, 1:53 PM

## 2022-10-01 NOTE — Progress Notes (Signed)
   10/01/22 0601  15 Minute Checks  Location Bedroom  Visual Appearance Calm  Behavior Sleeping  Sleep (Behavioral Health Patients Only)  Calculate sleep? (Click Yes once per 24 hr at 0600 safety check) Yes  Documented sleep last 24 hours 11.25

## 2022-10-01 NOTE — BHH Counselor (Signed)
CSW received call from [name], group home administrator.   [name] states that he will be able to pick pt up tomorrow at 11:00 AM.   [name] states he needs patients medications to be sent to [facility], in [place], [place].   [name] states that he will be here to pick pt up around 11:00 AM.   CSW informed provider that pt needs medications sent in advance.   CSW will fax pt's insurance to pharmacy and to group home per [facility] request.   [name], MSW, [facility] 10/01/2022 10:12 AM

## 2022-10-01 NOTE — Progress Notes (Signed)
   10/01/22 1200  Psych Admission Type (Psych Patients Only)  Admission Status Involuntary  Psychosocial Assessment  Patient Complaints None  Eye Contact Fair  Facial Expression Sad  Affect Sad  Speech Slurred  Interaction Minimal  Motor Activity Slow  Appearance/Hygiene In scrubs  Behavior Characteristics Cooperative  Mood Pleasant  Thought Process  Coherency Flight of ideas  Content Preoccupation  Delusions Grandeur  Perception Hallucinations  Hallucination Auditory;Visual  Judgment Limited  Confusion Mild  Danger to Self  Current suicidal ideation? Denies  Self-Injurious Behavior No self-injurious ideation or behavior indicators observed or expressed   Agreement Not to Harm Self Yes  Danger to Others  Danger to Others None reported or observed

## 2022-10-01 NOTE — Progress Notes (Addendum)
   10/01/22 2145  Psych Admission Type (Psych Patients Only)  Admission Status Involuntary  Psychosocial Assessment  Patient Complaints None  Eye Contact Fair  Facial Expression Flat  Affect Appropriate to circumstance  Speech Slurred  Interaction Minimal (pt sleeping)  Motor Activity Slow  Appearance/Hygiene Unremarkable  Behavior Characteristics Cooperative  Mood Pleasant  Thought Process  Coherency Flight of ideas  Content WDL  Delusions None reported or observed  Perception Hallucinations  Hallucination None reported or observed  Judgment Limited  Confusion UTA  Danger to Self  Current suicidal ideation? Denies  Danger to Others  Danger to Others None reported or observed   Progress note   D: Pt seen in his room. Pt had been sleeping since beginning of shift. Pt denies SI, HI, AVH. Pt rates pain  0/10. Pt rates anxiety  0/10 and depression  0/10. Pt says that he is going to be discharged tomorrow. Pt is pleasant and appropriate. Says he got to go outside today for recreation. No other concerns noted at this time.  A: Pt provided support and encouragement. Pt given scheduled medication as prescribed. PRNs as appropriate. Q15 min checks for safety.   R: Pt safe on the unit. Will continue to monitor.

## 2022-10-01 NOTE — BHH Counselor (Signed)
CSW faxed [facility] pt's insurance per their request.   Fax: [phone]  [name], MSW, [facility] 10/01/2022 2:04 PM

## 2022-10-01 NOTE — Group Note (Signed)
Date:  10/01/2022 Time:  9:48 PM  Group Topic/Focus:  Recovery Goals:   The focus of this group is to identify appropriate goals for recovery and establish a plan to achieve them.    Participation Level:  Did Not Attend  Participation Quality:   Did Not Attend  Affect:   Did Not Attend  Cognitive:   Did Not Attend  Insight: None  Engagement in Group:  None  Modes of Intervention:   Did Not Attend  Additional Comments:    [name] 10/01/2022, 9:48 PM

## 2022-10-01 NOTE — Group Note (Signed)
Date:  10/01/2022 Time:  5:34 PM  Group Topic/Focus:  Goals Group:   The focus of this group is to help patients establish daily goals to achieve during treatment and discuss how the patient can incorporate goal setting into their daily lives to aide in recovery.    Participation Level: Active  Participation Quality:  Appropriate  Affect:  Appropriate  Cognitive:  Appropriate  Insight: Appropriate  Engagement in Group:  Engaged  Modes of Intervention:  Activity and Discussion  Additional Comments:    Ardelle Anton 10/01/2022, 5:34 PM
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
## 2022-10-01 NOTE — Group Note (Signed)
Date:  10/01/2022 Time:  12:17 AM  Group Topic/Focus:  Goals Group:   The focus of this group is to help patients establish daily goals to achieve during treatment and discuss how the patient can incorporate goal setting into their daily lives to aide in recovery.    Participation Level:  Did Not Attend  Participation Quality:      Affect:      Cognitive:      Insight: None  Engagement in Group:  None  Modes of Intervention:      Additional Comments:    [name] 10/01/2022, 12:17 AM

## 2022-10-02 LAB — CARBAMAZEPINE, FREE AND TOTAL: Carbamazepine, Total: 10.7 ug/mL (ref 4.0–12.0)

## 2022-10-02 NOTE — Progress Notes (Signed)
  [facility] Adult Case Management Discharge Plan :  Will you be returning to the same living situation after discharge:  No. Pt is going to [facility]  At discharge, do you have transportation home?: Yes,  [name] will be picking pt up  Do you have the ability to pay for your medications: Yes,  MEDICARE / MEDICARE PART A AND B  Release of information consent forms completed and in the chart;  Patient's signature needed at discharge.  Patient to Follow up at:  Follow-up Information     [facility], [facility]. Go on 10/08/2022.   Why: Your appointment is scheduled for 11:00 AM. Please remember to bring your insurance card. Contact information: [address] [place] [address] [phone]                 Next level of care provider has access to [facility] Link:no  Safety Planning and Suicide Prevention discussed: Yes,  [name], guardian and [name], group home administrator     Has patient been referred to the Quitline?: Patient refused referral for treatment  Patient has been referred for addiction treatment: No known substance use disorder.  [address], LCSWA 10/02/2022, 9:52 AM

## 2022-10-02 NOTE — BHH Counselor (Signed)
CSW received successful fax report from [employer].   [name], MSW, [place] 10/02/2022 3:26 PM

## 2022-10-02 NOTE — BHH Suicide Risk Assessment (Signed)
[facility] INPATIENT:  Family/Significant Other Suicide Prevention Education  Suicide Prevention Education:  Education Completed; [name] (guardian),  (name of family member/significant other) has been identified by the patient as the family member/significant other with whom the patient will be residing, and identified as the person(s) who will aid the patient in the event of a mental health crisis (suicidal ideations/suicide attempt).  With written consent from the patient, the family member/significant other has been provided the following suicide prevention education, prior to the and/or following the discharge of the patient.  The suicide prevention education provided includes the following: Suicide risk factors Suicide prevention and interventions National Suicide Hotline telephone number [facility] assessment telephone number [facility] Emergency Assistance 911 [facility] and/or Residential Mobile Crisis Unit telephone number  Request made of family/significant other to: Remove weapons (e.g., guns, rifles, knives), all items previously/currently identified as safety concern.   Remove drugs/medications (over-the-counter, prescriptions, illicit drugs), all items previously/currently identified as a safety concern.  The family member/significant other verbalizes understanding of the suicide prevention education information provided.  The family member/significant other agrees to remove the items of safety concern listed above.  [name] 10/02/2022, 9:55 AM

## 2022-10-02 NOTE — Care Management Important Message (Signed)
Important Message  Patient Details  Name: [name] MRN: [id number] Date of Birth: Apr 09, 1960   Important Message Given:  Yes - Medicare IM     [name], LCSWA 10/02/2022, 9:57 AM

## 2022-10-02 NOTE — Progress Notes (Signed)
D: Pt alert and oriented. Pt denies experiencing any pain, SI/HI, or AVH at this time. Pt reports he will be able to keep himself safe when he returns home. Pt has completed a suicide safety plan and was given a survey to fill out. Transition Record, SRA, and AVS have been reviewed and given to pt. Pt denies wanting the floor manager to contact him upon discharge.  A: Pt received discharge and medication education/information. Pt belongings were returned and signed for at this time.   R: Pt verbalized understanding of discharge and medication education/information.  Pt escorted by staff to the medical mall front lobby where the pt was picked up by group home [facility]) staff.

## 2022-10-02 NOTE — Plan of Care (Signed)
D: Pt alert and oriented. Pt denies experiencing any anxiety/depression at this time. Pt denies experiencing any pain at this time. Pt denies experiencing any SI/HI, or AVH at this time.   A: Scheduled medications administered to pt, per MD orders. Support and encouragement provided. Frequent verbal contact made. Routine safety checks conducted q15 minutes.   R: No adverse drug reactions noted. Pt verbally contracts for safety at this time. Pt compliant with medications and treatment plan. Pt interacts well with others on the unit. Pt remains safe at this time. Plan of care ongoing.  Pt voices excitement over discharging today.  Problem: Nutrition: Goal: Adequate nutrition will be maintained Outcome: Progressing   Problem: Coping: Goal: Level of anxiety will decrease Outcome: Progressing

## 2022-10-02 NOTE — BHH Counselor (Signed)
CSW faxed referral to [name] (fax: [phone]) per request of [name] for continuation of care.   [name], MSW, [place] 10/02/2022 3:21 PM

## 2023-09-04 ENCOUNTER — Other Ambulatory Visit: Payer: Self-pay

## 2023-09-04 DIAGNOSIS — F22 Delusional disorders: Secondary | ICD-10-CM | POA: Insufficient documentation

## 2023-09-04 DIAGNOSIS — I10 Essential (primary) hypertension: Secondary | ICD-10-CM | POA: Insufficient documentation

## 2023-09-04 DIAGNOSIS — F259 Schizoaffective disorder, unspecified: Secondary | ICD-10-CM | POA: Insufficient documentation

## 2023-09-04 DIAGNOSIS — F250 Schizoaffective disorder, bipolar type: Secondary | ICD-10-CM | POA: Diagnosis not present

## 2023-09-04 LAB — COMPREHENSIVE METABOLIC PANEL WITH GFR
ALT: 21 U/L (ref 0–44)
AST: 45 U/L — ABNORMAL HIGH (ref 15–41)
Albumin: 4 g/dL (ref 3.5–5.0)
Alkaline Phosphatase: 112 U/L (ref 38–126)
Anion gap: 13 (ref 5–15)
BUN: 12 mg/dL (ref 8–23)
CO2: 23 mmol/L (ref 22–32)
Calcium: 9.1 mg/dL (ref 8.9–10.3)
Chloride: 105 mmol/L (ref 98–111)
Creatinine, Ser: 0.7 mg/dL (ref 0.61–1.24)
GFR, Estimated: >60 mL/min (ref >60)
Glucose, Bld: 107 mg/dL — ABNORMAL HIGH (ref 70–99)
Potassium: 3.7 mmol/L (ref 3.5–5.1)
Sodium: 141 mmol/L (ref 135–145)
Total Bilirubin: 0.7 mg/dL (ref 0.0–1.2)
Total Protein: 8 g/dL (ref 6.5–8.1)

## 2023-09-04 LAB — CBC WITH DIFFERENTIAL/PLATELET
Abs Immature Granulocytes: 0.01 K/uL (ref 0.00–0.07)
Basophils Absolute: 0 K/uL (ref 0.0–0.1)
Basophils Relative: 0 %
Eosinophils Absolute: 0.1 K/uL (ref 0.0–0.5)
Eosinophils Relative: 1 %
HCT: 39.8 % (ref 39.0–52.0)
Hemoglobin: 12.4 g/dL — ABNORMAL LOW (ref 13.0–17.0)
Immature Granulocytes: 0 %
Lymphocytes Relative: 32 %
Lymphs Abs: 2.3 K/uL (ref 0.7–4.0)
MCH: 31.8 pg (ref 26.0–34.0)
MCHC: 31.2 g/dL (ref 30.0–36.0)
MCV: 102.1 fL — ABNORMAL HIGH (ref 80.0–100.0)
Monocytes Absolute: 0.9 K/uL (ref 0.1–1.0)
Monocytes Relative: 13 %
Neutro Abs: 3.9 K/uL (ref 1.7–7.7)
Neutrophils Relative %: 54 %
Platelets: 301 K/uL (ref 150–400)
RBC: 3.9 MIL/uL — ABNORMAL LOW (ref 4.22–5.81)
RDW: 13.3 % (ref 11.5–15.5)
WBC: 7.2 K/uL (ref 4.0–10.5)
nRBC: 0 % (ref 0.0–0.2)

## 2023-09-04 LAB — ETHANOL: Alcohol, Ethyl (B): <15 mg/dL (ref <15)

## 2023-09-04 NOTE — ED Triage Notes (Signed)
 Coming from [employer], Facility states patient has been delusional. Has been verbally aggressive with patients and staff. Making threats and destroying property. Has Hx of harm to himself and others

## 2023-09-05 ENCOUNTER — Emergency Department
Admission: EM | Admit: 2023-09-05 | Discharge: 2023-09-05 | Disposition: A | Discharge status: Other Acute Inpatient Hospital | Attending: Emergency Medicine | Admitting: Emergency Medicine

## 2023-09-05 ENCOUNTER — Encounter: Payer: Self-pay | Admitting: Child & Adolescent Psychiatry

## 2023-09-05 ENCOUNTER — Inpatient Hospital Stay
Admission: EM | Admit: 2023-09-05 | Discharge: 2023-09-08 | DRG: 885 | Disposition: A | Discharge status: Other Acute Inpatient Hospital | Source: Intra-hospital | Attending: Child & Adolescent Psychiatry | Admitting: Child & Adolescent Psychiatry

## 2023-09-05 DIAGNOSIS — F29 Unspecified psychosis not due to a substance or known physiological condition: Principal | ICD-10-CM | POA: Diagnosis present

## 2023-09-05 DIAGNOSIS — I10 Essential (primary) hypertension: Secondary | ICD-10-CM | POA: Diagnosis present

## 2023-09-05 DIAGNOSIS — F250 Schizoaffective disorder, bipolar type: Principal | ICD-10-CM | POA: Diagnosis present

## 2023-09-05 DIAGNOSIS — F17210 Nicotine dependence, cigarettes, uncomplicated: Secondary | ICD-10-CM | POA: Diagnosis present

## 2023-09-05 DIAGNOSIS — R4689 Other symptoms and signs involving appearance and behavior: Secondary | ICD-10-CM

## 2023-09-05 DIAGNOSIS — F22 Delusional disorders: Secondary | ICD-10-CM

## 2023-09-05 LAB — RESP PANEL BY RT-PCR (RSV, FLU A&B, COVID)  RVPGX2
Influenza A by PCR: NEGATIVE
Influenza B by PCR: NEGATIVE
Resp Syncytial Virus by PCR: NEGATIVE
SARS Coronavirus 2 by RT PCR: NEGATIVE

## 2023-09-05 LAB — URINALYSIS, ROUTINE W REFLEX MICROSCOPIC
Bacteria, UA: NONE SEEN
Bilirubin Urine: NEGATIVE
Glucose, UA: NEGATIVE mg/dL
Hgb urine dipstick: NEGATIVE
Ketones, ur: NEGATIVE mg/dL
Nitrite: NEGATIVE
Protein, ur: NEGATIVE mg/dL
Specific Gravity, Urine: 1.004 — ABNORMAL LOW (ref 1.005–1.030)
pH: 6 (ref 5.0–8.0)

## 2023-09-05 LAB — URINE DRUG SCREEN, QUALITATIVE (ARMC ONLY)
Amphetamines, Ur Screen: NOT DETECTED
Barbiturates, Ur Screen: NOT DETECTED
Benzodiazepine, Ur Scrn: NOT DETECTED
Cannabinoid 50 Ng, Ur ~~LOC~~: NOT DETECTED
Cocaine Metabolite,Ur ~~LOC~~: NOT DETECTED
MDMA (Ecstasy)Ur Screen: NOT DETECTED
Methadone Scn, Ur: NOT DETECTED
Opiate, Ur Screen: NOT DETECTED
Phencyclidine (PCP) Ur S: NOT DETECTED
Tricyclic, Ur Screen: POSITIVE — AB

## 2023-09-05 MED ORDER — LORAZEPAM 2 MG/ML IJ SOLN
2.0000 mg | Freq: Once | INTRAMUSCULAR | Status: AC
  Administered 2023-09-05: 2 mg via INTRAMUSCULAR

## 2023-09-05 MED ORDER — ALUM & MAG HYDROXIDE-SIMETH 200-200-20 MG/5ML PO SUSP: 30.0000 mL | ORAL | Status: DC | PRN

## 2023-09-05 MED ORDER — ATORVASTATIN CALCIUM 10 MG PO TABS
40.0000 mg | ORAL_TABLET | Freq: Every day | ORAL | Status: DC
  Administered 2023-09-05 – 2023-09-07 (×3): 40 mg via ORAL
  Filled 2023-09-05 (×3): qty 4

## 2023-09-05 MED ORDER — ONDANSETRON 4 MG PO TBDP
4.0000 mg | ORAL_TABLET | Freq: Once | ORAL | Status: AC
  Administered 2023-09-05: 4 mg via ORAL
  Filled 2023-09-05: qty 1

## 2023-09-05 MED ORDER — OLANZAPINE 5 MG PO TBDP
5.0000 mg | ORAL_TABLET | Freq: Three times a day (TID) | ORAL | Status: DC | PRN
  Administered 2023-09-05 – 2023-09-08 (×3): 5 mg via ORAL
  Filled 2023-09-05 (×3): qty 1

## 2023-09-05 MED ORDER — TRAZODONE HCL 100 MG PO TABS
100.0000 mg | ORAL_TABLET | Freq: Every day | ORAL | Status: DC
  Administered 2023-09-05 – 2023-09-07 (×3): 100 mg via ORAL
  Filled 2023-09-05 (×3): qty 1

## 2023-09-05 MED ORDER — FLUPHENAZINE HCL 5 MG PO TABS
5.0000 mg | ORAL_TABLET | ORAL | Status: DC
  Administered 2023-09-05 – 2023-09-08 (×5): 5 mg via ORAL
  Filled 2023-09-05 (×7): qty 1

## 2023-09-05 MED ORDER — METOPROLOL TARTRATE 25 MG PO TABS
25.0000 mg | ORAL_TABLET | Freq: Two times a day (BID) | ORAL | Status: DC
  Administered 2023-09-05 – 2023-09-08 (×6): 25 mg via ORAL
  Filled 2023-09-05 (×6): qty 1

## 2023-09-05 MED ORDER — ACETAMINOPHEN 325 MG PO TABS: 650.0000 mg | ORAL_TABLET | Freq: Four times a day (QID) | ORAL | Status: DC | PRN

## 2023-09-05 MED ORDER — CLONAZEPAM 1 MG PO TABS
1.0000 mg | ORAL_TABLET | Freq: Two times a day (BID) | ORAL | Status: DC
  Administered 2023-09-05 – 2023-09-08 (×7): 1 mg via ORAL
  Filled 2023-09-05 (×7): qty 1

## 2023-09-05 MED ORDER — CARBAMAZEPINE ER 200 MG PO TB12
200.0000 mg | ORAL_TABLET | Freq: Two times a day (BID) | ORAL | Status: DC
  Administered 2023-09-05 – 2023-09-08 (×7): 200 mg via ORAL
  Filled 2023-09-05 (×7): qty 1

## 2023-09-05 MED ORDER — OLANZAPINE 10 MG IM SOLR
10.0000 mg | Freq: Once | INTRAMUSCULAR | Status: AC | PRN
  Administered 2023-09-05: 10 mg via INTRAMUSCULAR
  Filled 2023-09-05: qty 10

## 2023-09-05 MED ORDER — MENTHOL 3 MG MT LOZG: 1.0000 | LOZENGE | OROMUCOSAL | Status: DC | PRN

## 2023-09-05 MED ORDER — LORAZEPAM 2 MG PO TABS
2.0000 mg | ORAL_TABLET | Freq: Once | ORAL | Status: AC
  Administered 2023-09-05: 2 mg via ORAL
  Filled 2023-09-05: qty 1

## 2023-09-05 MED ORDER — MAGNESIUM HYDROXIDE 400 MG/5ML PO SUSP: 30.0000 mL | Freq: Every day | ORAL | Status: DC | PRN

## 2023-09-05 MED ORDER — CHLORPROMAZINE HCL 100 MG PO TABS
200.0000 mg | ORAL_TABLET | Freq: Every day | ORAL | Status: DC
  Administered 2023-09-05 – 2023-09-07 (×3): 200 mg via ORAL
  Filled 2023-09-05 (×3): qty 2

## 2023-09-05 MED ORDER — AMLODIPINE BESYLATE 5 MG PO TABS
10.0000 mg | ORAL_TABLET | Freq: Every day | ORAL | Status: DC
  Administered 2023-09-05 – 2023-09-08 (×3): 10 mg via ORAL
  Filled 2023-09-05 (×3): qty 2

## 2023-09-05 MED ORDER — ZIPRASIDONE MESYLATE 20 MG IM SOLR
20.0000 mg | Freq: Once | INTRAMUSCULAR | Status: AC
  Administered 2023-09-05: 20 mg via INTRAMUSCULAR
  Filled 2023-09-05: qty 20

## 2023-09-05 MED ORDER — LOSARTAN POTASSIUM 25 MG PO TABS
100.0000 mg | ORAL_TABLET | Freq: Every day | ORAL | Status: DC
  Administered 2023-09-05 – 2023-09-08 (×3): 100 mg via ORAL
  Filled 2023-09-05 (×3): qty 4

## 2023-09-05 NOTE — ED Provider Notes (Signed)
 EKG inter by me at 1015 heart rate 85 QRS 90 QTc 440 Slight artifact.  Normal sinus rhythm, no evidence of acute QT prolongation or cardiac dysrhythmia.   Dicky Anes, MD 09/05/23 1017

## 2023-09-05 NOTE — ED Notes (Signed)
 Dr. Ruther from psych at bedside speaking with pt.

## 2023-09-05 NOTE — ED Notes (Signed)
 Patient is back in his room.

## 2023-09-05 NOTE — ED Notes (Signed)
 Pt waiting on UDS and COVID results to come back before he can go downstairs

## 2023-09-05 NOTE — ED Notes (Signed)
 RN introduced self to pt. Pt given warm blankets. Pt requesting something to eat. Was told he would have to see the ED provider first. Pt also instructed that pt's are trying to sleep so he needed to stay in his room.

## 2023-09-05 NOTE — ED Notes (Signed)
Pt in the shower at this time 

## 2023-09-05 NOTE — ED Notes (Signed)
 Pt out into the hall asking for another sandwich tray. Pt told there was no more. Pt angry. Slammed door and banged on the window with his hand multiple times. ED provider updated.

## 2023-09-05 NOTE — Progress Notes (Signed)
 Patient became angry at some unknown issue and began yelling, threatening and scaring other patients.  The dayroom had to be shut down. Patient went to room and was slamming doors, yellling, throwing objects including his bedside table and his linen basket.  Security was called to the unit. Other residents came out to the nurses' station to express fear of the patient injuring them. Patient was coming in and out of the room and slamming the door over and over. Patient only had an oral agitation order so doctor was contacted and zyprexa  10 mg IM ordered. Patient agreed to take the medication and it was given. Security remains on the unit to continue monitoring his erratic behavior.

## 2023-09-05 NOTE — ED Provider Notes (Signed)
 [facility] Provider Note    Event Date/Time   First MD Initiated Contact with Patient 09/05/23 0013     (approximate)   History   Psychiatric Evaluation   HPI  [name] is a 63 y.o. male   Past medical history of schizoaffective and hypertension who presents to the Emergency Department under IVC from [facility] for delusional behaviors, violent behaviors.  Per their report he is delusional thinking he has somebody else, also has  been aggressive at his care facility.  When I speak with the patient he does note that he understands he is here for psychiatric evaluation.  He denies drug or alcohol use, denies suicidality or homicidality and denies audiovisual hallucinations.  He has no other acute medical complaints aside the fact that he is hungry and feeling nauseated because he is hungry.  External Medical Documents Reviewed: Psychiatry notes, [facility] notes      Physical Exam   Triage Vital Signs: ED Triage Vitals  Encounter Vitals Group     BP 09/04/23 2254 (!) 183/99     Girls Systolic BP Percentile --      Girls Diastolic BP Percentile --      Boys Systolic BP Percentile --      Boys Diastolic BP Percentile --      Pulse Rate 09/04/23 2254 91     Resp 09/04/23 2254 20     Temp --      Temp src --      SpO2 09/04/23 2254 100 %     Weight --      Height --      Head Circumference --      Peak Flow --      Pain Score 09/04/23 2253 0     Pain Loc --      Pain Education --      Exclude from Growth Chart --     Most recent vital signs: Vitals:   09/04/23 2254  BP: (!) 183/99  Pulse: 91  Resp: 20  SpO2: 100%    General: Awake, no distress.  CV:  Good peripheral perfusion.  Resp:  Normal effort.  Abd:  No distention.  Other:  Hypertensive otherwise vital signs within normal limits.  No signs of trauma.  Moving all extremities.  At times conversant appropriate with me, at times as a leave the room begins yelling at staff and banging  on the door.   ED Results / Procedures / Treatments   Labs (all labs ordered are listed, but only abnormal results are displayed) Labs Reviewed  CBC WITH DIFFERENTIAL/PLATELET - Abnormal; Notable for the following components:      Result Value   RBC 3.90 (*)    Hemoglobin 12.4 (*)    MCV 102.1 (*)    All other components within normal limits  COMPREHENSIVE METABOLIC PANEL WITH GFR - Abnormal; Notable for the following components:   Glucose, Bld 107 (*)    AST 45 (*)    All other components within normal limits  URINALYSIS, ROUTINE W REFLEX MICROSCOPIC - Abnormal; Notable for the following components:   Color, Urine STRAW (*)    APPearance CLEAR (*)    Specific Gravity, Urine 1.004 (*)    Leukocytes,Ua MODERATE (*)    All other components within normal limits  ETHANOL     I ordered and reviewed the above labs they are notable for cell counts and electrolytes unremarkable   PROCEDURES:  Critical Care performed:  No  .Critical Care  Performed by: Cyrena Mylar, MD Authorized by: Cyrena Mylar, MD   Critical care provider statement:    Critical care time (minutes):  30   Critical care was time spent personally by me on the following activities:  Development of treatment plan with patient or surrogate, discussions with consultants, evaluation of patient's response to treatment, examination of patient, ordering and review of laboratory studies, ordering and review of radiographic studies, ordering and performing treatments and interventions, pulse oximetry, re-evaluation of patient's condition and review of old charts    MEDICATIONS ORDERED IN ED: Medications  ziprasidone  (GEODON ) injection 20 mg (has no administration in time range)  ondansetron  (ZOFRAN -ODT) disintegrating tablet 4 mg (4 mg Oral Given 09/05/23 0035)  LORazepam  (ATIVAN ) tablet 2 mg (2 mg Oral Given 09/05/23 0035)     IMPRESSION / MDM / ASSESSMENT AND PLAN / ED COURSE  I reviewed the triage vital signs and the  nursing notes.                                Patient's presentation is most consistent with acute presentation with potential threat to life or bodily function.  Differential diagnosis includes, but is not limited to, acute decompensated psychiatric illness, intoxication, considered but less likely organic causes like infection metabolic derangements or trauma   MDM: Here under IVC from outside facility for delusional behaviors, violent behaviors, aggressive behaviors.  Became very aggressive in the emergency department, banging on the windows, yelling at staff, unable to be verbally redirected and required IM sedation at a therapeutic dose.  Did not require restraints.  Basic labs unremarkable, no other marked medical complaints for the patient nor on my physical exam.  Psychiatric consultation requested, medically clear.       FINAL CLINICAL IMPRESSION(S) / ED DIAGNOSES   Final diagnoses:  Aggressive behavior  Delusions (HCC)     Rx / DC Orders   ED Discharge Orders     None        Note:  This document was prepared using Dragon voice recognition software and may include unintentional dictation errors.    Cyrena Mylar, MD 09/05/23 [phone]

## 2023-09-05 NOTE — ED Notes (Signed)
TTS at bedside speaking with pt 

## 2023-09-05 NOTE — Progress Notes (Signed)
 Patient hollering down hall, ambulating in and out of other patients rooms. Currently, cursing slamming doors, and screaming he wants to leave and verbally threatened security and nurses.   Patient requested injection be given in his left arm. Injection administered in left deltoid. Pt tolerated it well, and asked everyone to get out and slammed door.   Pt declined any further needs, denies SI/HI. Pt actively responding to external stimuli. Will continue to monitor for disruptive behaviors, safety, and further needs.denied.  Will continue to monitor for safety, care, and comfort while on unit.

## 2023-09-05 NOTE — Group Note (Signed)
 Date:  09/05/2023 Time:  8:27 PM  Group Topic/Focus:  Healthy Communication:   The focus of this group is to discuss communication, barriers to communication, as well as healthy ways to communicate with others.    Participation Level:  Active  Participation Quality:  Appropriate  Affect:  Appropriate  Cognitive:  Appropriate  Insight: Appropriate and Good  Engagement in Group:  Engaged  Modes of Intervention:  Discussion  Additional Comments:    Romero Earnie Hope 09/05/2023, 8:27 PM

## 2023-09-05 NOTE — ED Notes (Signed)
 Patient is in the hall. [employer] officer informed patient to go back to his room. Patient continues to be in the hall.

## 2023-09-05 NOTE — ED Notes (Signed)
 Pt out in the hall yelling and cussing. ED Provider to bedside to assess pt.

## 2023-09-05 NOTE — BH Assessment (Signed)
 Comprehensive Clinical Assessment (CCA) Note  09/05/2023 [name] [id number]  Chief Complaint:  Chief Complaint  Patient presents with   Psychiatric Evaluation   Visit Diagnosis: Schizoaffective    [name] is a 63 year old male who presents to the ER due to his care facility having concerns about his mental health and mood. Patient reported to have become verbally aggressive and unable to be redirected. Upon arrival to the ER, his behaviors continued and required medications to manage him. During the interview, the patient was hyper-verbal and required a great deal of redirection. He was respectful and considerate. Patient denies SI/HI and AV/H. However, TTS witnessed the patient responding to internal stimuli, prior to started the interview.  CCA Screening, Triage and Referral (STR)  Patient Reported Information How did you hear about us ? Other (Comment)  What Is the Reason for Your Visit/Call Today? Patient came to the ER due to his manic like behaviors.  How Long Has This Been Causing You Problems? 1 wk - 1 month  What Do You Feel Would Help You the Most Today? Treatment for Depression or other mood problem   Have You Recently Had Any Thoughts About Hurting Yourself? No  Are You Planning to Commit Suicide/Harm Yourself At This time? No   Flowsheet Row ED from 09/05/2023 in [facility] Emergency Department at [facility] Most recent reading at 09/05/2023  1:35 PM Admission (Current) from 09/05/2023 in [facility] BEHAVIORAL MEDICINE Most recent reading at 09/05/2023 12:00 PM Admission (Discharged) from 08/10/2022 in [facility] BEHAVIORAL MEDICINE Most recent reading at 08/19/2022 10:36 PM  C-SSRS RISK CATEGORY No Risk No Risk No Risk    Have you Recently Had Thoughts About Hurting Someone [place]? No  Are You Planning to Harm Someone at This Time? No  Explanation: No data recorded  Have You Used Any Alcohol or Drugs in the Past 24 Hours? No  How  Long Ago Did You Use Drugs or Alcohol? No data recorded What Did You Use and How Much? No data recorded  Do You Currently Have a Therapist/Psychiatrist? Yes  Name of Therapist/Psychiatrist: Name of Therapist/Psychiatrist: Via the care facility   Have You Been Recently Discharged From Any Office Practice or Programs? No  Explanation of Discharge From Practice/Program: No data recorded    CCA Screening Triage Referral Assessment Type of Contact: Face-to-Face  Telemedicine Service Delivery:   Is this Initial or Reassessment?   Date Telepsych consult ordered in [facility]:    Time Telepsych consult ordered in [facility]:    Location of Assessment: [facility] ED  Provider Location: [facility] ED   Collateral Involvement: No data recorded  Does Patient Have a Court Appointed Legal Guardian? Yes Other:  Legal Guardian Contact Information: [name], [employer] [phone]  Copy of Legal Guardianship Form: Yes  Legal Guardian Notified of Arrival: Attempted notification unsuccessful  Legal Guardian Notified of Pending Discharge: Successfully notified  If Minor and Not Living with Parent(s), Who has Custody? No data recorded Is CPS involved or ever been involved? Never  Is APS involved or ever been involved? Never   Patient Determined To Be At Risk for Harm To Self or Others Based on Review of Patient Reported Information or Presenting Complaint? No  Method: No data recorded Availability of Means: No data recorded Intent: No data recorded Notification Required: No data recorded Additional Information for Danger to Others Potential: No data recorded Additional Comments for Danger to Others Potential: No data recorded Are There Guns or Other Weapons in  Your Home? No  Types of Guns/Weapons: No data recorded Are These Weapons Safely Secured?                            No  Who Could Verify You Are Able To Have These Secured: No data recorded Do You Have any Outstanding Charges, Pending  Court Dates, Parole/Probation? No data recorded Contacted To Inform of Risk of Harm To Self or Others: No data recorded   Does Patient Present under Involuntary Commitment? Yes    [place] of Residence: [place]   Patient Currently Receiving the Following Services: Medication Management   Determination of Need: Emergent (2 hours)   Options For Referral: ED Visit; Inpatient Hospitalization     CCA Biopsychosocial Patient Reported Schizophrenia/Schizoaffective Diagnosis in Past: Yes   Strengths: Have stable housing, able to perform his ADL's independently and has outpatient provider.   Mental Health Symptoms Depression:  Change in energy/activity; Difficulty Concentrating   Duration of Depressive symptoms: Duration of Depressive Symptoms: N/A   Mania:  Change in energy/activity; Increased Energy; Irritability   Anxiety:   Difficulty concentrating; Irritability; Restlessness   Psychosis:  Delusions   Duration of Psychotic symptoms:    Trauma:  N/A   Obsessions:  N/A   Compulsions:  N/A   Inattention:  N/A   Hyperactivity/Impulsivity:  N/A   Oppositional/Defiant Behaviors:  N/A   Emotional Irregularity:  N/A   Other Mood/Personality Symptoms:  No data recorded   Mental Status Exam Appearance and self-care  Stature:  Average   Weight:  Average weight   Clothing:  No data recorded  Grooming:  Normal   Cosmetic use:  None   Posture/gait:  Normal   Motor activity:  -- (Within normal range)   Sensorium  Attention:  Distractible   Concentration:  Anxiety interferes; Focuses on irrelevancies; Scattered   Orientation:  X5   Recall/memory:  Normal   Affect and Mood  Affect:  Anxious; Full Range   Mood:  Anxious   Relating  Eye contact:  Normal   Facial expression:  Anxious; Responsive   Attitude toward examiner:  Cooperative   Thought and Language  Speech flow: Articulation error; Flight of Ideas   Thought content:  Appropriate to Mood  and Circumstances   Preoccupation:  Ruminations   Hallucinations:  None (Patient denies)   Organization:  Coherent   [employer] of Knowledge:  Average   Intelligence:  Average   Abstraction:  Functional   Judgement:  Fair   Dance movement psychotherapist:  Distorted   Insight:  Fair   Decision Making:  Impulsive   Social Functioning  Social Maturity:  Impulsive   Social Judgement:  Heedless; Impropriety; Naive   Stress  Stressors:  Other (Comment)   Coping Ability:  Overwhelmed   Skill Deficits:  None   Supports:  Friends/Service system     Religion: Religion/Spirituality Are You A Religious Person?: No  Leisure/Recreation: Leisure / Recreation Do You Have Hobbies?: No  Exercise/Diet: Exercise/Diet Do You Exercise?: No Have You Gained or Lost A Significant Amount of Weight in the Past Six Months?: No Do You Follow a Special Diet?: No Do You Have Any Trouble Sleeping?: No   CCA Employment/Education Employment/Work Situation: Employment / Work Situation Employment Situation: On disability Why is Patient on Disability: Mental Health How Long has Patient Been on Disability: Unable to quantify  Education: Education Is Patient Currently Attending School?: No Did You Have An  Individualized Education Program (IIEP): No Did You Have Any Difficulty At School?: No Patient's Education Has Been Impacted by Current Illness: No   CCA Family/Childhood History Family and Relationship History: Family history Marital status: Single Does patient have children?: No  Childhood History:  Childhood History Did patient suffer any verbal/emotional/physical/sexual abuse as a child?: No Did patient suffer from severe childhood neglect?: No Has patient ever been sexually abused/assaulted/raped as an adolescent or adult?: No Was the patient ever a victim of a crime or a disaster?: No Witnessed domestic violence?: No Has patient been affected by domestic violence  as an adult?: No       CCA Substance Use Alcohol/Drug Use: Alcohol / Drug Use Pain Medications: See MAR Prescriptions: See MAR Over the Counter: See MAR History of alcohol / drug use?: No history of alcohol / drug abuse Longest period of sobriety (when/how long): n/a  ASAM's:  Six Dimensions of Multidimensional Assessment  Dimension 1:  Acute Intoxication and/or Withdrawal Potential:      Dimension 2:  Biomedical Conditions and Complications:      Dimension 3:  Emotional, Behavioral, or Cognitive Conditions and Complications:     Dimension 4:  Readiness to Change:     Dimension 5:  Relapse, Continued use, or Continued Problem Potential:     Dimension 6:  Recovery/Living Environment:     ASAM Severity Score:    ASAM Recommended Level of Treatment:     Substance use Disorder (SUD)    Recommendations for Services/Supports/Treatments:    Disposition Recommendation per psychiatric provider: Inpatient Treatment   DSM5 Diagnoses: Patient Active Problem List   Diagnosis Date Noted   Psychosis (HCC) 09/05/2023   Schizoaffective disorder, bipolar type (HCC) 06/15/2016   Hypertension 06/13/2016   Tobacco use disorder 10/04/2015   Noncompliance 10/03/2015     Referrals to Alternative Service(s): Referred to Alternative Service(s):   Place:   Date:   Time:    Referred to Alternative Service(s):   Place:   Date:   Time:    Referred to Alternative Service(s):   Place:   Date:   Time:    Referred to Alternative Service(s):   Place:   Date:   Time:     Patrcia Kiki Jakob, Counselor

## 2023-09-05 NOTE — ED Notes (Signed)
 This RN went to give pt meds and sandwich tray. Pt in his room naked. RN told pt to cover up. Pt took meds with no issue.

## 2023-09-05 NOTE — ED Notes (Signed)
 Pt urinated in his bed while sleeping. Complete bed change was done by ED tech, Hadassah. Pt given new scrubs and socks to wear.

## 2023-09-05 NOTE — ED Notes (Signed)
Pt was provided breakfast tray

## 2023-09-05 NOTE — ED Notes (Signed)
 Pt back out into the hall. Pt redirected back to his room by PD officer. Pt back in his room with the door closed yelling.

## 2023-09-05 NOTE — ED Notes (Signed)
 Pt brought sandwich tray and water  out to security and said he wasn't going to eat poop. PT explained that he wasn't going to get anymore food tonight other than that. Pt declined it again.

## 2023-09-05 NOTE — BHH Counselor (Signed)
 LCSW was unable to attempted to complete PSA with the pt.    Pt verbally aggressive and extremly agitated. Security was called.

## 2023-09-05 NOTE — ED Notes (Addendum)
 Pt is done with his shower and has on clean scrubs.

## 2023-09-05 NOTE — ED Notes (Signed)
Pt took medication willingly.

## 2023-09-05 NOTE — Plan of Care (Signed)
 Patient admitted to [name] for aggressive behavior at his group home - sent to [name] where he was also aggressive there and transferred to [name].  Patient, who was a previous patient on [name], recognized the workers and was cooperative, hyperactive, happy to be on the unit and requesting he go live in a condo by himself. Patient stated to nurse Amy that his name is [name] (in the ER he was saying he was [name] which is a common delusion for this patient).  Patient cooperated with admission. Has flight of ideas and is disorganized.  Had to understand patient which again is not new. Patient ambulates without assistance. Is usually continent of urine and bowel during the day but can be incontinent at night for urine. The ER attempted to contact his guardian but they state they left a message as there was no answer. Patient denies SI, HI, AVH, anxiety and depression.  Will continue to observe.

## 2023-09-05 NOTE — ED Notes (Signed)
 IVC prior to arrival/ Psych Consult ordered/Pending/ Legal paperwork scanned into Epic

## 2023-09-05 NOTE — Progress Notes (Signed)
   09/05/23 1600  Psych Admission Type (Psych Patients Only)  Admission Status Involuntary  Psychosocial Assessment  Patient Complaints Irritability  Eye Contact Fair  Facial Expression Animated  Affect Irritable  Speech Rapid;Loud;Pressured  Interaction Assertive;Demanding;Intrusive  Motor Activity Hyperactive  Appearance/Hygiene Unremarkable  Behavior Characteristics Agressive verbally;Irritable  Mood Labile;Irritable  Thought Process  Coherency Disorganized  Content Delusions;Blaming others  Delusions Grandeur  Perception UTA  Hallucination Auditory  Judgment Poor  Confusion Mild  Danger to Self  Current suicidal ideation? Denies  Danger to Others  Danger to Others None reported or observed

## 2023-09-05 NOTE — ED Provider Notes (Addendum)
 Dr. Ruther has advised me the patient will need inpatient psychiatric admission.   Dicky Anes, MD 09/05/23 9059    Dicky Anes, MD 09/05/23 [phone]

## 2023-09-06 MED ORDER — OLANZAPINE 10 MG IM SOLR
10.0000 mg | Freq: Four times a day (QID) | INTRAMUSCULAR | Status: DC | PRN
  Administered 2023-09-06: 10 mg via INTRAMUSCULAR
  Filled 2023-09-06: qty 10

## 2023-09-06 MED ORDER — CHLORPROMAZINE HCL 50 MG/2ML IJ SOLN
50.0000 mg | Freq: Once | INTRAMUSCULAR | Status: AC
  Administered 2023-09-06: 50 mg via INTRAMUSCULAR
  Filled 2023-09-06 (×2): qty 2

## 2023-09-06 NOTE — Progress Notes (Signed)
   09/06/23 2000  Psych Admission Type (Psych Patients Only)  Admission Status Involuntary  Psychosocial Assessment  Patient Complaints None  Eye Contact Intense  Facial Expression Animated  Affect Labile  Speech Loud;Other (Comment) (pt hoarse and hard to understand)  Interaction Needy;Attention-seeking  Motor Activity Restless  Appearance/Hygiene Unremarkable  Behavior Characteristics Cooperative;Anxious  Mood Labile  Thought Process  Coherency Disorganized  Content Preoccupation  Delusions None reported or observed  Perception Hallucinations  Hallucination None reported or observed  Judgment Poor  Confusion UTA  Danger to Self  Current suicidal ideation? Denies  Danger to Others  Danger to Others Reported or observed  Danger to Others Abnormal  Harmful Behavior to others Threats of violence towards other people observed or expressed   Description of Harmful Behavior during day shift  Destructive Behavior No threats or harm toward property   Progress note   D: Pt seen at nurse's station. Pt denies SI, HI, AVH. Pt rates pain  0/10. Pt rates anxiety  0/10 and depression  0/10. Pt is hard to understand with a hoarse voice. Shift report stated pt was yelling all day and has lost his voice. Pt is labile and confused. Preoccupied with discharge. Pt is to be transferred to [facility] on 09/07/23 d/t high violence risk and the risk to other patients in the open milieu. Pt is also sexually inappropriate at times with male staff as witnessed by this Clinical research associate. Pt is being monitored closely when he is in the milieu by a male staff member as he has responded better with them.  Pt asked for phone number of his [facility]. Was given the number and pt spoke with him. Pt is not being told about his transfer to avoid escalating violent behavior. No other concerns noted at this time.  A: Pt provided support and encouragement. Pt given scheduled medication as prescribed. PRNs as appropriate. Q15 min  checks for safety.   R: Pt safe on the unit. Will continue to monitor.

## 2023-09-06 NOTE — Progress Notes (Signed)
 Estimated Sleeping Duration (Last 24 Hours): 4.25-4.75 hours PRNs given for sleep partially effective; anxiety and agitation decreased w/meds given for behaviors.  09/05/23 2101  Psych Admission Type (Psych Patients Only)  Admission Status Involuntary  Psychosocial Assessment  Patient Complaints Agitation;Anxiety;Suspiciousness  Eye Contact Fair  Facial Expression Animated;Worried  Affect Angry;Anxious;Irritable;Preoccupied  Speech Argumentative;Rapid;Loud  Interaction Assertive;Attention-seeking;Childlike;Demanding  Motor Activity Hyperactive  Appearance/Hygiene Unremarkable  Behavior Characteristics Agressive verbally;Agitated;Anxious;Hyperactive;Impulsive;Irritable;Pacing;Wandering risk  Mood Anxious;Angry;Preoccupied  Aggressive Behavior  Effect No apparent injury  Thought Process  Coherency Disorganized  Content Blaming others;Delusions  Delusions Grandeur;Paranoid  Perception Hallucinations  Hallucination Auditory  Judgment Poor  Confusion Moderate  Danger to Self  Current suicidal ideation? Denies  Danger to Others  Danger to Others None reported or observed

## 2023-09-06 NOTE — BHH Counselor (Signed)
 CSW contacted [facility] [phone]) regarding pt's disposition.   Reports that he can return to the placement but they will be serving pt a 30 day notice today to his guardian.   Reports guardian needs to find placement following that.   CSW will inform care team   Lum Croft, MSW, [facility] 09/06/2023 10:07 AM

## 2023-09-06 NOTE — Plan of Care (Signed)
  Problem: Education: °Goal: Knowledge of [facility] Education information/materials will improve °Outcome: Progressing °Goal: Emotional status will improve °Outcome: Progressing °Goal: Mental status will improve °Outcome: Progressing °Goal: Verbalization of understanding the information provided will improve °Outcome: Progressing °  °Problem: Health Behavior/Discharge Planning: °Goal: Identification of resources available to assist in meeting health care needs will improve °Outcome: Progressing °Goal: Compliance with treatment plan for underlying cause of condition will improve °Outcome: Progressing °  °Problem: Safety: °Goal: Periods of time without injury will increase °Outcome: Progressing °  °

## 2023-09-06 NOTE — Progress Notes (Signed)
 Pt currently at the nurse's station, whispering unknown chattered in regards to a previous admission at another facility. Denies needs, SI/HI/AVH at this time.   09/06/23 0130  [place] Violence Checklist (Document every shift between 4 & 7)  Confusion 1  Irritable 0  Boisterous 0  Physical threats 0  Verbal threats 0  Attacking objects 0  Total Score 1  Violence Risk Category Moderate Risk  Violence Prevention Guidelines *See Row Information* Moderate Violence Risk interventions implemented  Violence Risk Additional Interventions Keep doors open;Verbal de-escalation;Supportive listening  [facility]HAI Shift Assessment (Document every shift between 4 & 7)  What level of intervention did this patient need during the current shift? Level 2 - Moderate Interventions  In the next shift do you anticipate this patient will need: Level 2 - Moderate Interventions

## 2023-09-06 NOTE — Progress Notes (Signed)
 Patient banging fists on nurse's station counter,  posturing, yelling, verbally aggressive and threatening staff. Also was made aware patient had called 911.  Security called to unit.  PRN Zyprexa  given for agitation. No manual hold required.  Security on unit for a few minutes after injection.

## 2023-09-06 NOTE — Group Note (Signed)
 Date:  09/06/2023 Time:  10:58 PM  Group Topic/Focus:  Identifying Needs:   The focus of this group is to help patients identify their personal needs that have been historically problematic and identify healthy behaviors to address their needs.    Participation Level:  Minimal  Participation Quality:  Appropriate, Attentive, and Intrusive  Affect:  Appropriate  Cognitive:  Alert, Confused, and Delusional  Insight: Limited  Engagement in Group:  Engaged  Modes of Intervention:  Orientation  Additional Comments:    [name] 09/06/2023, 10:58 PM

## 2023-09-06 NOTE — Progress Notes (Signed)
 Pt in room lying on floor, denies needs at this time. Denies SI/HI/ AVH.  09/05/23 2346  [place] Violence Checklist (Document every shift between 4 & 7)  Confusion 1  Irritable 1  Boisterous 0  Physical threats 0  Verbal threats 0  Attacking objects 0  Total Score 2  Violence Risk Category Moderate Risk  Violence Prevention Guidelines *See Row Information* Moderate Violence Risk interventions implemented  Violence Risk Additional Interventions Keep doors open;Verbal de-escalation;Supportive listening  Patients response to intervention Decreased  BHAI Shift Assessment (Document every shift between 4 & 7)  What level of intervention did this patient need during the current shift? Level 2 - Moderate Interventions  In the next shift do you anticipate this patient will need: Level 2 - Moderate Interventions

## 2023-09-06 NOTE — H&P (Signed)
 Chief Complaint  Sent here from [facility] for evaluation because they say I was acting delusional.  History of Present Illness  Mr. Larsh is a 63 year old male with a past psychiatric history of schizoaffective disorder and a past medical history of hypertension, presenting under Involuntary Commitment (IVC) from [facility]. Facility reports state the patient has been increasingly delusional, verbally aggressive toward staff and peers, making threats, and destroying property. They also report a history of harm to himself and others.  On arrival, patient was observed hollering in the hallway, entering other patients' rooms, slamming doors, and verbally threatening security and nursing staff. He required intramuscular medication, which he tolerated, and subsequently requested to be left alone.  On interview, patient acknowledges he was sent for psychiatric evaluation. He currently denies suicidal ideation, homicidal ideation, auditory or visual hallucinations, and substance use. He attributes his nausea and irritability to being hungry. Despite denials, patient continues to display disorganized behaviors and responds to external stimuli during assessment.  Past Psychiatric History  Diagnoses: Schizoaffective disorder  Hospitalizations: Multiple prior psychiatric admissions (per record review)  Suicide Attempts: History of self-harm and aggression toward others per facility report  Outpatient Care: Followed at [facility] prior to IVC  Medication Trials: [To be confirmed--likely antipsychotics, mood stabilizers]  Compliance: Questionable, concern for nonadherence at [facility]  Past Medical History  Hypertension  Past Surgical History  Non-contributory  Allergies  No known drug allergies (NKDA)  Medications  [To be reconciled with current chart/medication list]  Family History  Psychiatric history: Unknown  Medical history: Non-contributory  Social History  Lives in residential care facility  ([facility])  Tobacco: [Unknown]  Alcohol: Denies  Illicit substances: Denies  Legal: Involuntary commitment due to aggression and delusions  Employment: Disabled  Review of Systems  Psychiatric: Delusions, agitation, aggression, denies SI/HI/AH/VH at present  Neurologic: No headache, seizure, weakness reported  Cardiac: No chest pain, palpitations, dizziness  GI: Nausea attributed to hunger, no vomiting  Other systems: Negative  Vital Signs (09/05/23 1015, per chart)  HR: 85 bpm  QRS: 90 ms  QTc: 440 ms  Interpretation: Normal sinus rhythm, no acute QT prolongation or dysrhythmia (slight artifact noted)  Mental Status Examination  Appearance: Disheveled, casually dressed, age-appropriate  Behavior: Agitated, pacing, slamming doors, verbally aggressive, intermittently cooperative  Speech: Loud, pressured at times  Mood: I'm just hungry  Affect: Irritable, labile  Thought Process: Disorganized, tangential, evidence of delusional content  Thought Content: Denies SI/HI; persecutory and identity-related delusions suspected  Perceptions: Responding to external stimuli despite denial of hallucinations  Cognition: Alert, oriented to person and place, partial to time and situation  Insight: Poor  Judgment: Poor  Suicide Risk Assessment  Denies current suicidal ideation  Past history of self-harm and aggression  No current plan or intent  Risk elevated due to psychiatric instability, poor insight, history of violence  Assessment  63 year old male with schizoaffective disorder and hypertension, presenting under IVC for acute psychotic decompensation characterized by delusions, aggression, and disruptive behaviors in a structured facility. Despite denials, his behavior is consistent with psychosis and impaired reality testing. He is not currently safe for discharge and continues to pose a risk to self and others due to agitation and poor impulse  control.  Plan  Psychiatric:  Continue inpatient psychiatric stabilization under IVC. Restart home medications.  PRN intramuscular medication for acute agitation as needed.  Medical:  Monitor vital signs.  Hypertension: continue home antihypertensive medications if known and confirmed.  No acute EKG abnormalities.  Safety:  Close observation  for aggression, psychosis, and suicidal/homicidal risk.  Maintain structured milieu with behavioral monitoring.  Labs/Diagnostics:  Baseline CMP, CBC, TSH, UDS, ethanol level if not already obtained.  Disposition:  Admit to inpatient psychiatric unit for stabilization and treatment.  Communicate findings with [facility] and treatment team for continuity of care.

## 2023-09-06 NOTE — Progress Notes (Signed)
 Mood:  Irritable, labile, preoccupied with discharge   Endorses / Denies: Observed speaking to people that are not present. Denies SI/HI.    Medication/ PRNs: Compliant with scheduled medications.    Pain: Denies.  Present in milieu / group attendance:  Patient is restless, pacing and constantly at nurse's station.  Preoccupied with speaking to social work.  Difficult to re-direct.    15 min checks in place for safety.     Security called to the unit twice for patient to receive PRN IM injections for agitation. Yelling, verbally and physically threatening to staff, yelling at other patients and posturing.  AC and Dr. Ruther aware of patient's behaviors.

## 2023-09-06 NOTE — Group Note (Signed)
 Date:  09/06/2023 Time:  12:00 PM  Group Topic/Focus:  Building Self Esteem:   The Focus of this group is helping patients become aware of the effects of self-esteem on their lives, the things they and others do that enhance or undermine their self-esteem, seeing the relationship between their level of self-esteem and the choices they make and learning ways to enhance self-esteem. Goals Group:   The focus of this group is to help patients establish daily goals to achieve during treatment and discuss how the patient can incorporate goal setting into their daily lives to aide in recovery. Making Healthy Choices:   The focus of this group is to help patients identify negative/unhealthy choices they were using prior to admission and identify positive/healthier coping strategies to replace them upon discharge.    Participation Level:  Minimal  Participation Quality:  Attentive  Affect:  Anxious  Cognitive:  Oriented  Insight: Limited  Engagement in Group:  Limited  Modes of Intervention:  Activity  Additional Comments:    [name] Bunker 09/06/2023, 12:00 PM

## 2023-09-06 NOTE — BHH Suicide Risk Assessment (Signed)
 [facility] Admission Suicide Risk Assessment   Nursing information obtained from:  Patient Demographic factors:  Male Current Mental Status:  NA Loss Factors:  NA Historical Factors:  NA Risk Reduction Factors:  Religious beliefs about death  Total Time spent with patient: 45 minutes Principal Problem: Psychosis (HCC) Diagnosis:  Principal Problem:   Psychosis (HCC)  Subjective Data: Mr. Pieroni is a 63 year old male with a past psychiatric history of schizoaffective disorder and a past medical history of hypertension, presenting under Involuntary Commitment (IVC) from [facility]. Facility reports state the patient has been increasingly delusional, verbally aggressive toward staff and peers, making threats, and destroying property. They also report a history of harm to himself and others.  On arrival, patient was observed hollering in the hallway, entering other patients' rooms, slamming doors, and verbally threatening security and nursing staff. He required intramuscular medication, which he tolerated, and subsequently requested to be left alone.  On interview, patient acknowledges he was sent for psychiatric evaluation. He currently denies suicidal ideation, homicidal ideation, auditory or visual hallucinations, and substance use. He attributes his nausea and irritability to being hungry. Despite denials, patient continues to display disorganized behaviors and responds to external stimuli during assessment.  Continued Clinical Symptoms:  Alcohol Use Disorder Identification Test Final Score (AUDIT): 0 The Alcohol Use Disorders Identification Test, Guidelines for Use in Primary Care, Second Edition.  World Science writer [facility]). Score between 0-7:  no or low risk or alcohol related problems. Score between 8-15:  moderate risk of alcohol related problems. Score between 16-19:  high risk of alcohol related problems. Score 20 or above:  warrants further diagnostic evaluation for alcohol dependence  and treatment.   CLINICAL FACTORS:   Schizophrenia:   Command hallucinatons   Musculoskeletal: Strength & Muscle Tone: within normal limits Gait & Station: normal Patient leans: N/A  Psychiatric Specialty Exam:    Appearance: Disheveled, casually dressed, age-appropriate  Behavior: Agitated, pacing, slamming doors, verbally aggressive, intermittently cooperative  Speech: Loud, pressured at times  Mood: I'm just hungry  Affect: Irritable, labile  Thought Process: Disorganized, tangential, evidence of delusional content  Thought Content: Denies SI/HI; persecutory and identity-related delusions suspected  Perceptions: Responding to external stimuli despite denial of hallucinations  Cognition: Alert, oriented to person and place, partial to time and situation  Insight: Poor  Judgment: Poor  Physical Exam: Physical Exam ROS Blood pressure (!) 169/101, pulse (!) 101, temperature 98.2 F (36.8 C), resp. rate 16, height 5' 7 (1.702 m), weight 63.3 kg, SpO2 98%. Body mass index is 21.85 kg/m.   COGNITIVE FEATURES THAT CONTRIBUTE TO RISK:  Closed-mindedness    SUICIDE RISK:   Mild:  Suicidal ideation of limited frequency, intensity, duration, and specificity.  There are no identifiable plans, no associated intent, mild dysphoria and related symptoms, good self-control (both objective and subjective assessment), few other risk factors, and identifiable protective factors, including available and accessible social support.  PLAN OF CARE: Admit to inpatient and restart home medications.  I certify that inpatient services furnished can reasonably be expected to improve the patient's condition.   Millie JONELLE Manners, MD 09/06/2023, 12:52 PM

## 2023-09-06 NOTE — Progress Notes (Signed)
 Patient continues to be agitated.  Yelling at staff and attempting to punch RN and jump over the nurse's station counter.  Security called to unit.  Dr Ruther notified.  Verbal order given for 50 mg IM Thorazine .  Patient took injection willingly, but continued to threaten nursing staff and security officers. Security officer remains on unit to ensure safety. Patient's behaviors are disruptive and frightening to other patients.

## 2023-09-06 NOTE — Plan of Care (Signed)
  Problem: Education: Goal: Emotional status will improve Outcome: Not Progressing Goal: Verbalization of understanding the information provided will improve Outcome: Not Progressing   Problem: Activity: Goal: Interest or engagement in activities will improve Outcome: Not Progressing

## 2023-09-06 NOTE — Progress Notes (Signed)
 Pt told MHT that he had a rash in his groin area and needed someone to look at it. This Clinical research associate, accompanied by male MHT, went to pt room. Pt then proceeded to hold out his penis and ask about being circumcised. That white nurse told me that I wasn't circumcised. Do I look like I'm circumcised? Verified by sight that pt is circumcised and left the room.

## 2023-09-07 DIAGNOSIS — F29 Unspecified psychosis not due to a substance or known physiological condition: Secondary | ICD-10-CM

## 2023-09-07 NOTE — Progress Notes (Signed)
 Received phone call from sheriffs office stating they would not be able to transport patient today.

## 2023-09-07 NOTE — Progress Notes (Signed)
   09/07/23 2000  Psych Admission Type (Psych Patients Only)  Admission Status Involuntary  Psychosocial Assessment  Patient Complaints Anxiety;Irritability;Hyperactivity  Eye Contact Darting;Intense  Facial Expression Animated  Affect Labile  Speech Tangential;Rapid;Argumentative  Interaction Intrusive;Demanding;Childlike;Assertive  Motor Activity Fidgety;Restless  Appearance/Hygiene Disheveled  Behavior Characteristics Cooperative;Anxious;Fidgety;Impulsive;Intrusive  Mood Labile;Preoccupied  Thought Process  Coherency Disorganized  Content Preoccupation  Delusions None reported or observed  Perception Hallucinations  Hallucination None reported or observed  Judgment Poor  Confusion Mild  Danger to Self  Current suicidal ideation? Denies  Danger to Others  Danger to Others Reported or observed  Danger to Others Abnormal  Harmful Behavior to others No threats or harm toward other people  Destructive Behavior No threats or harm toward property

## 2023-09-07 NOTE — Progress Notes (Signed)
 Pt is excitable tonight. At nurse's station asking when he will be discharged in the morning. Cursing at staff. This Clinical research associate was attending to another pt at the time, but received this report from other staff members. Pt in his room responding to internal stimuli at present. Will administer PRN if pt returns to the nurse's station in an agitated state.

## 2023-09-07 NOTE — Progress Notes (Signed)
(  Sleep Hours) - 7.5 (Any PRNs that were needed, meds refused, or side effects to meds)- none (Any disturbances and when (visitation, over night)- pt would get up and come down hallway and need to be redirected; responding at times to internal stimuli (Concerns raised by the patient)- pt fixated on discharge; did not tell pt he will be transferred to [facility] for safety reasons (SI/HI/AVH)- denies

## 2023-09-07 NOTE — Progress Notes (Signed)
   09/07/23 0800  Psych Admission Type (Psych Patients Only)  Admission Status Involuntary  Psychosocial Assessment  Patient Complaints Agitation;Anger;Anxiety;Confusion;Hyperactivity;Irritability  Eye Contact Darting;Intense  Facial Expression Animated;Fixed smile  Affect Irritable;Angry;Blunted;Labile  Speech Loud;Aggressive;Argumentative  Interaction Assertive;Childlike;Demanding;Evasive;Hostile;Needy  Motor Activity Fidgety;Restless  Appearance/Hygiene Disheveled  Behavior Characteristics Agressive verbally;Agitated;Anxious;Restless;Irritable  Mood Anxious;Labile;Angry;Irritable  Thought Process  Coherency Disorganized  Content Preoccupation  Delusions None reported or observed  Perception Hallucinations  Hallucination None reported or observed  Judgment Poor  Confusion Moderate  Danger to Self  Current suicidal ideation? Denies  Danger to Others  Danger to Others Reported or observed  Danger to Others Abnormal  Harmful Behavior to others Acts of violence towards other people observed

## 2023-09-07 NOTE — Plan of Care (Signed)
  Problem: Education: Goal: Emotional status will improve Outcome: Progressing   Problem: Activity: Goal: Sleeping patterns will improve Outcome: Progressing   Problem: Health Behavior/Discharge Planning: Goal: Compliance with treatment plan for underlying cause of condition will improve Outcome: Progressing   Problem: Safety: Goal: Periods of time without injury will increase Outcome: Progressing
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
## 2023-09-07 NOTE — Group Note (Signed)
 Recreation Therapy Group Note   Group Topic:Coping Skills  Group Date: 09/07/2023 Start Time: 1400 End Time: 1500 Facilitators: Celestia Jeoffrey BRAVO, LRT, CTRS Location: Courtyard  Group Description: Leisure. Patients were given the option to choose from bowling, corn hole, jenga or UNO. LRT and pts discussed the meaning of leisure, the importance of participating in leisure during their free time/when they're outside of the hospital, as well as how our leisure interests can also serve as coping skills.   Goal Area(s) Addressed:  Patient will identify a current leisure interest.  Patient will learn the definition of "leisure". Patient will practice making a positive decision. Patient will have the opportunity to try a new leisure activity. Patient will communicate with peers and LRT.    Affect/Mood: Appropriate   Participation Level: Hyperverbal   Participation Quality: Independent   Behavior: Cooperative   Speech/Thought Process: Flight of ideas and Loose association   Insight: Limited   Judgement: Limited   Modes of Intervention: Cooperative Play, Music, and Socialization   Patient Response to Interventions:  Receptive   Education Outcome:  In group clarification offered    Clinical Observations/Individualized Feedback: [name] was active in their participation of session activities and group discussion. Pt chose to play UNO with LRT and peers while present. Pt was noted to be talking to himself duration of session. Pt did play game and interact appropriately.    Plan: Continue to engage patient in RT group sessions 2-3x/week.   Jeoffrey BRAVO Celestia, LRT, CTRS 09/07/2023 3:54 PM

## 2023-09-07 NOTE — Progress Notes (Signed)
 Attempted to call report to [facility] and was told nurse is with another patient. I asked them to have her return my call at her earliest convenience.

## 2023-09-07 NOTE — Group Note (Signed)
 Date:  09/07/2023 Time:  4:06 PM  Group Topic/Focus:  Goals Group:   The focus of this group is to help patients establish daily goals to achieve during treatment and discuss how the patient can incorporate goal setting into their daily lives to aide in recovery.    Participation Level:  Minimal  Participation Quality:  Appropriate  Affect:  Appropriate  Cognitive:  Appropriate  Insight: Appropriate  Engagement in Group:  Engaged  Modes of Intervention:  Socialization  Additional Comments:    [name] 09/07/2023, 4:06 PM

## 2023-09-07 NOTE — Group Note (Signed)
 Date:  09/07/2023 Time:  10:07 PM  Group Topic/Focus:  Wellness Toolbox:   The focus of this group is to discuss various aspects of wellness, balancing those aspects and exploring ways to increase the ability to experience wellness.  Patients will create a wellness toolbox for use upon discharge. Wrap-Up Group:   The focus of this group is to help patients review their daily goal of treatment and discuss progress on daily workbooks.    Participation Level:  Minimal  Participation Quality:  Attentive  Affect:  Excited  Cognitive:  Lacking  Insight: Limited  Engagement in Group:  Limited  Modes of Intervention:  Discussion  Additional Comments:    [name] 09/07/2023, 10:07 PM

## 2023-09-07 NOTE — BH IP Treatment Plan (Signed)
 Interdisciplinary Treatment and Diagnostic Plan Update  09/07/2023 Time of Session: 11:39 AM [name] MRN: [id number]  Principal Diagnosis: Psychosis [facility])  Secondary Diagnoses: Principal Problem:   Psychosis (HCC)   Current Medications:  Current Facility-Administered Medications  Medication Dose Route Frequency Provider Last Rate Last Admin   acetaminophen  (TYLENOL ) tablet 650 mg  650 mg Oral Q6H PRN Madaram, Kondal R, MD       alum & mag hydroxide-simeth (MAALOX/MYLANTA) 200-200-20 MG/5ML suspension 30 mL  30 mL Oral Q4H PRN Madaram, Kondal R, MD       amLODipine  (NORVASC ) tablet 10 mg  10 mg Oral Daily Madaram, Kondal R, MD   10 mg at 09/06/23 0835   atorvastatin  (LIPITOR) tablet 40 mg  40 mg Oral QHS Madaram, Kondal R, MD   40 mg at 09/06/23 2100   carbamazepine  (TEGRETOL  XR) 12 hr tablet 200 mg  200 mg Oral BID Madaram, Kondal R, MD   200 mg at 09/07/23 0754   chlorproMAZINE  (THORAZINE ) tablet 200 mg  200 mg Oral QHS Madaram, Kondal R, MD   200 mg at 09/06/23 2100   clonazePAM  (KLONOPIN ) tablet 1 mg  1 mg Oral BID Madaram, Kondal R, MD   1 mg at 09/07/23 0753   fluPHENAZine  (PROLIXIN ) tablet 5 mg  5 mg Oral [id number] Madaram, Kondal R, MD   5 mg at 09/07/23 0754   losartan  (COZAAR ) tablet 100 mg  100 mg Oral Daily Madaram, Kondal R, MD   100 mg at 09/06/23 9165   magnesium  hydroxide (MILK OF MAGNESIA) suspension 30 mL  30 mL Oral Daily PRN Madaram, Kondal R, MD       menthol -cetylpyridinium (CEPACOL) lozenge 3 mg  1 lozenge Oral PRN Madaram, Kondal R, MD       metoprolol  tartrate (LOPRESSOR ) tablet 25 mg  25 mg Oral BID Madaram, Kondal R, MD   25 mg at 09/06/23 2100   OLANZapine  (ZYPREXA ) injection 10 mg  10 mg Intramuscular Q6H PRN Madaram, Kondal R, MD   10 mg at 09/06/23 1352   OLANZapine  zydis (ZYPREXA ) disintegrating tablet 5 mg  5 mg Oral TID PRN Madaram, Kondal R, MD   5 mg at 09/05/23 2032   traZODone  (DESYREL ) tablet 100 mg  100 mg Oral QHS Madaram, Kondal R, MD   100  mg at 09/06/23 2100   PTA Medications: Medications Prior to Admission  Medication Sig Dispense Refill Last Dose/Taking   amLODipine  (NORVASC ) 10 MG tablet Take 1 tablet (10 mg total) by mouth daily. 30 tablet 3    atorvastatin  (LIPITOR) 40 MG tablet Take 1 tablet (40 mg total) by mouth at bedtime. 30 tablet 3    carbamazepine  (TEGRETOL  XR) 200 MG 12 hr tablet Take 1 tablet (200 mg total) by mouth 2 (two) times daily. 60 tablet 3    chlorproMAZINE  (THORAZINE ) 200 MG tablet Take 1 tablet (200 mg total) by mouth at bedtime. 30 tablet 3    clonazePAM  (KLONOPIN ) 1 MG tablet Take 1 tablet (1 mg total) by mouth 2 (two) times daily. 60 tablet 3    diphenhydrAMINE  (BENADRYL ) 50 MG capsule Take 1 capsule (50 mg total) by mouth 2 (two) times daily at 8 am and 4 pm. (Patient not taking: Reported on 09/05/2023) 60 capsule 3    fluPHENAZine  (PROLIXIN ) 5 MG tablet Take 1 tablet (5 mg total) by mouth 2 (two) times daily at 8 am and 4 pm. 60 tablet 3    losartan  (COZAAR ) 100 MG tablet  Take 1 tablet (100 mg total) by mouth daily. 30 tablet 3    metoprolol  tartrate (LOPRESSOR ) 25 MG tablet Take 25 mg by mouth 2 (two) times daily.      traZODone  (DESYREL ) 100 MG tablet Take 1 tablet (100 mg total) by mouth at bedtime. 30 tablet 3     Patient Stressors:    Patient Strengths:    Treatment Modalities: Medication Management, Group therapy, Case management,  1 to 1 session with clinician, Psychoeducation, Recreational therapy.   Physician Treatment Plan for Primary Diagnosis: Psychosis (HCC) Long Term Goal(s):     Short Term Goals:    Medication Management: Evaluate patient's response, side effects, and tolerance of medication regimen.  Therapeutic Interventions: 1 to 1 sessions, Unit Group sessions and Medication administration.  Evaluation of Outcomes: Not Progressing  Physician Treatment Plan for Secondary Diagnosis: Principal Problem:   Psychosis (HCC)  Long Term Goal(s):     Short Term Goals:        Medication Management: Evaluate patient's response, side effects, and tolerance of medication regimen.  Therapeutic Interventions: 1 to 1 sessions, Unit Group sessions and Medication administration.  Evaluation of Outcomes: Not Progressing   RN Treatment Plan for Primary Diagnosis: Psychosis (HCC) Long Term Goal(s): Knowledge of disease and therapeutic regimen to maintain health will improve  Short Term Goals: Ability to remain free from injury will improve, Ability to verbalize frustration and anger appropriately will improve, Ability to demonstrate self-control, Ability to participate in decision making will improve, Ability to verbalize feelings will improve, Ability to disclose and discuss suicidal ideas, Ability to identify and develop effective coping behaviors will improve, and Compliance with prescribed medications will improve  Medication Management: RN will administer medications as ordered by provider, will assess and evaluate patient's response and provide education to patient for prescribed medication. RN will report any adverse and/or side effects to prescribing provider.  Therapeutic Interventions: 1 on 1 counseling sessions, Psychoeducation, Medication administration, Evaluate responses to treatment, Monitor vital signs and CBGs as ordered, Perform/monitor CIWA, COWS, AIMS and Fall Risk screenings as ordered, Perform wound care treatments as ordered.  Evaluation of Outcomes: Not Progressing   LCSW Treatment Plan for Primary Diagnosis: Psychosis (HCC) Long Term Goal(s): Safe transition to appropriate next level of care at discharge, Engage patient in therapeutic group addressing interpersonal concerns.  Short Term Goals: Engage patient in aftercare planning with referrals and resources, Increase social support, Increase ability to appropriately verbalize feelings, Increase emotional regulation, Facilitate acceptance of mental health diagnosis and concerns, Facilitate patient  progression through stages of change regarding substance use diagnoses and concerns, Identify triggers associated with mental health/substance abuse issues, and Increase skills for wellness and recovery  Therapeutic Interventions: Assess for all discharge needs, 1 to 1 time with Social worker, Explore available resources and support systems, Assess for adequacy in community support network, Educate family and significant other(s) on suicide prevention, Complete Psychosocial Assessment, Interpersonal group therapy.  Evaluation of Outcomes: Not Progressing   Progress in Treatment: Attending groups: Yes. and No. Participating in groups: Yes. and No. Taking medication as prescribed: Yes. Toleration medication: Yes. Family/Significant other contact made: No, will contact:  CSW will contact pt's legal guardian  Patient understands diagnosis: No. Discussing patient identified problems/goals with staff: Yes. Medical problems stabilized or resolved: Yes. Denies suicidal/homicidal ideation: Yes. Issues/concerns per patient self-inventory: No. Other: None   New problem(s) identified: No, Describe:  None identified  New Short Term/Long Term Goal(s): elimination of symptoms of psychosis, medication management for  mood stabilization; elimination of SI thoughts; development of comprehensive mental wellness plan.   Patient Goals:  Stick around in [place], try to do better than I have been doing, stick to [facility]  Discharge Plan or Barriers: CSW will assist with appropriate discharge planning   Reason for Continuation of Hospitalization: Delusions  Hallucinations Medication stabilization  Estimated Length of Stay: 1 to 7 days  Last 3 [place] Suicide Severity Risk Score: Flowsheet Row ED from 09/05/2023 in [facility] Emergency Department at [facility] Most recent reading at 09/05/2023  1:35 PM Admission (Current) from 09/05/2023 in [facility] BEHAVIORAL MEDICINE Most recent reading at  09/05/2023 12:00 PM Admission (Discharged) from 08/10/2022 in [facility] BEHAVIORAL MEDICINE Most recent reading at 08/19/2022 10:36 PM  C-SSRS RISK CATEGORY No Risk No Risk No Risk    Last PHQ 2/9 Scores:     No data to display          Scribe for Treatment Team: Lum JONETTA Croft, LCSWA 09/07/2023 1:12 PM

## 2023-09-07 NOTE — Progress Notes (Signed)
   09/07/23 1410  Section 1: Provider Certification  Patient Condition Patient stabilized  Reason for Transfer Higher level of care  Benefits of Transfer higher level of care  Risks of Transfer violence  Level of Care RN  Accepting Physician Dr. Raliegh  Sending Physician Dr. Jadapalle  What is the patient's mental capacity? (Only Physicians and APPs can determine a patient's mental capacity) Patient has mental incapacitation with someone to act on their behalf  Physician Assessment Patient examined and risks and benefits explained  Section 2: Clinician Certification  Accepting hospital or facility Available service confirmed;Available space confirmed  Accepting Facility [facility]  Transfer Coordinator - name, # Cherylynn SAUNDERS, RN  Report to Next Provider yes  Date report called 09/07/23  Time report called 1400  Transport By Other (Comment)  Copies of Medical Records Sent H & P  Patient Belongings Disposition Sent with Patient

## 2023-09-08 ENCOUNTER — Encounter (HOSPITAL_COMMUNITY): Payer: Self-pay | Admitting: Student in an Organized Health Care Education/Training Program

## 2023-09-08 ENCOUNTER — Inpatient Hospital Stay (HOSPITAL_COMMUNITY)
Admission: AD | Admit: 2023-09-08 | Discharge: 2023-09-15 | DRG: 885 | Disposition: A | Discharge status: Home / Self Care | Source: Intra-hospital | Attending: Student in an Organized Health Care Education/Training Program | Admitting: Student in an Organized Health Care Education/Training Program

## 2023-09-08 ENCOUNTER — Other Ambulatory Visit: Payer: Self-pay

## 2023-09-08 DIAGNOSIS — Z716 Tobacco abuse counseling: Secondary | ICD-10-CM | POA: Diagnosis not present

## 2023-09-08 DIAGNOSIS — F419 Anxiety disorder, unspecified: Secondary | ICD-10-CM | POA: Diagnosis present

## 2023-09-08 DIAGNOSIS — Z79899 Other long term (current) drug therapy: Secondary | ICD-10-CM | POA: Diagnosis not present

## 2023-09-08 DIAGNOSIS — K5900 Constipation, unspecified: Secondary | ICD-10-CM | POA: Diagnosis present

## 2023-09-08 DIAGNOSIS — F250 Schizoaffective disorder, bipolar type: Secondary | ICD-10-CM | POA: Diagnosis present

## 2023-09-08 DIAGNOSIS — F17210 Nicotine dependence, cigarettes, uncomplicated: Secondary | ICD-10-CM | POA: Diagnosis present

## 2023-09-08 DIAGNOSIS — I10 Essential (primary) hypertension: Secondary | ICD-10-CM | POA: Diagnosis present

## 2023-09-08 DIAGNOSIS — Z888 Allergy status to other drugs, medicaments and biological substances status: Secondary | ICD-10-CM | POA: Diagnosis not present

## 2023-09-08 DIAGNOSIS — F259 Schizoaffective disorder, unspecified: Principal | ICD-10-CM | POA: Diagnosis present

## 2023-09-08 DIAGNOSIS — F29 Unspecified psychosis not due to a substance or known physiological condition: Secondary | ICD-10-CM | POA: Diagnosis not present

## 2023-09-08 MED ORDER — AMLODIPINE BESYLATE 10 MG PO TABS
10.0000 mg | ORAL_TABLET | Freq: Every day | ORAL | Status: DC
  Administered 2023-09-09 – 2023-09-15 (×7): 10 mg via ORAL
  Filled 2023-09-08 (×8): qty 1

## 2023-09-08 MED ORDER — LOSARTAN POTASSIUM 50 MG PO TABS
100.0000 mg | ORAL_TABLET | Freq: Every day | ORAL | Status: DC
  Administered 2023-09-09 – 2023-09-15 (×7): 100 mg via ORAL
  Filled 2023-09-08 (×7): qty 2

## 2023-09-08 MED ORDER — METOPROLOL TARTRATE 25 MG PO TABS: 25.0000 mg | ORAL_TABLET | Freq: Two times a day (BID) | ORAL | 0 refills | Status: AC

## 2023-09-08 MED ORDER — ALUM & MAG HYDROXIDE-SIMETH 200-200-20 MG/5ML PO SUSP: 30.0000 mL | ORAL | Status: DC | PRN

## 2023-09-08 MED ORDER — FLUPHENAZINE HCL 5 MG PO TABS
5.0000 mg | ORAL_TABLET | ORAL | Status: DC
  Administered 2023-09-08 – 2023-09-09 (×2): 5 mg via ORAL
  Filled 2023-09-08 (×2): qty 1

## 2023-09-08 MED ORDER — MAGNESIUM HYDROXIDE 400 MG/5ML PO SUSP: 30.0000 mL | Freq: Every day | ORAL | Status: DC | PRN

## 2023-09-08 MED ORDER — CARBAMAZEPINE ER 200 MG PO TB12
200.0000 mg | ORAL_TABLET | Freq: Two times a day (BID) | ORAL | Status: DC
  Administered 2023-09-08 – 2023-09-15 (×15): 200 mg via ORAL
  Filled 2023-09-08 (×15): qty 1

## 2023-09-08 MED ORDER — OLANZAPINE 5 MG PO TBDP
5.0000 mg | ORAL_TABLET | Freq: Three times a day (TID) | ORAL | Status: DC | PRN
Start: 2023-09-08 — End: 2023-09-16
  Administered 2023-09-09: 5 mg via ORAL
  Filled 2023-09-08: qty 1

## 2023-09-08 MED ORDER — DIPHENHYDRAMINE HCL 25 MG PO CAPS
50.0000 mg | ORAL_CAPSULE | ORAL | Status: DC
  Administered 2023-09-08 – 2023-09-15 (×15): 50 mg via ORAL
  Filled 2023-09-08 (×15): qty 2

## 2023-09-08 MED ORDER — TRAZODONE HCL 50 MG PO TABS
50.0000 mg | ORAL_TABLET | Freq: Every evening | ORAL | Status: DC | PRN
  Administered 2023-09-11 – 2023-09-14 (×3): 50 mg via ORAL
  Filled 2023-09-08 (×3): qty 1

## 2023-09-08 MED ORDER — ATORVASTATIN CALCIUM 40 MG PO TABS
40.0000 mg | ORAL_TABLET | Freq: Every day | ORAL | Status: DC
  Administered 2023-09-08 – 2023-09-14 (×7): 40 mg via ORAL
  Filled 2023-09-08 (×7): qty 1

## 2023-09-08 MED ORDER — HYDROXYZINE HCL 25 MG PO TABS
25.0000 mg | ORAL_TABLET | Freq: Three times a day (TID) | ORAL | Status: DC | PRN
Start: 2023-09-08 — End: 2023-09-16
  Administered 2023-09-08 – 2023-09-14 (×6): 25 mg via ORAL
  Filled 2023-09-08 (×7): qty 1

## 2023-09-08 MED ORDER — CHLORPROMAZINE HCL 25 MG PO TABS
200.0000 mg | ORAL_TABLET | Freq: Every day | ORAL | Status: DC
  Administered 2023-09-08 – 2023-09-14 (×7): 200 mg via ORAL
  Filled 2023-09-08 (×7): qty 8

## 2023-09-08 MED ORDER — ACETAMINOPHEN 325 MG PO TABS: 650.0000 mg | ORAL_TABLET | Freq: Four times a day (QID) | ORAL | Status: DC | PRN

## 2023-09-08 MED ORDER — METOPROLOL TARTRATE 25 MG PO TABS
25.0000 mg | ORAL_TABLET | Freq: Two times a day (BID) | ORAL | Status: DC
  Administered 2023-09-08 – 2023-09-15 (×15): 25 mg via ORAL
  Filled 2023-09-08 (×15): qty 1

## 2023-09-08 MED ORDER — TRAZODONE HCL 100 MG PO TABS
100.0000 mg | ORAL_TABLET | Freq: Every day | ORAL | Status: DC
  Administered 2023-09-08 – 2023-09-14 (×7): 100 mg via ORAL
  Filled 2023-09-08 (×7): qty 1

## 2023-09-08 MED ORDER — OLANZAPINE 10 MG IM SOLR: 5.0000 mg | Freq: Three times a day (TID) | INTRAMUSCULAR | Status: DC | PRN

## 2023-09-08 MED ORDER — CLONAZEPAM 0.5 MG PO TABS
1.0000 mg | ORAL_TABLET | Freq: Two times a day (BID) | ORAL | Status: DC
  Administered 2023-09-08 – 2023-09-10 (×4): 1 mg via ORAL
  Filled 2023-09-08 (×4): qty 2

## 2023-09-08 MED ORDER — OLANZAPINE 10 MG IM SOLR
10.0000 mg | Freq: Three times a day (TID) | INTRAMUSCULAR | Status: DC | PRN
  Administered 2023-09-09: 10 mg via INTRAMUSCULAR
  Filled 2023-09-08: qty 10

## 2023-09-08 NOTE — Progress Notes (Signed)
 Pt admitted from the [facility] unit at [facility] due to aggression  and agitation Pt was calm during admission and currently denies SI/HI/AVH at this time. Broset Moderate risk. Pt is hypersexual and states I like watch videos and have sex with women born with a penis. I want to be apart of the LGBT community and I needs to smoke because it keeps me from having erections. I will masturbate under my covers but I will not ejaculate, I save that for babies. I have a mythological god that has sex with animals and I am catholic. Patient oriented to unit and all questions answered. This write completed the admission than reported off to the primary nurse for this patient.

## 2023-09-08 NOTE — Plan of Care (Signed)
   Problem: Education: Goal: Knowledge of [facility] [facility] Education information/materials will improve Outcome: Progressing Goal: Emotional status will improve Outcome: Progressing Goal: Mental status will improve Outcome: Progressing Goal: Verbalization of understanding the information provided will improve Outcome: Progressing   Problem: Activity: Goal: Interest or engagement in activities will improve Outcome: Progressing

## 2023-09-08 NOTE — Progress Notes (Signed)
 Pt came back to nurse's station asking for water . Prepared to give pt PRNs. I don't want no fucking medicine just water . Pt told to watch his language and that he would be given medication and water . I don't want the water  now because you touched it. Everything Black women touch has germs on it. This Clinical research associate is wearing gloves. It doesn't matter. You got the white people germs on you. Pt took PO PRN without incident. Pt talking to himself as he returned to his room.

## 2023-09-08 NOTE — Progress Notes (Signed)
 Collateral contact - Tammy Bear (Adult Services, DSS social worker) [phone]  Patient signed the ROI and asked to call his social worker.  He wants to return to [facility], and she is helping him to obtain housing and food stamps.  Social worker said that she has known patient for 15 - 20 years.  She is currently not working with him but helped him to obtain an apartment in the past.  Child psychotherapist said that he grew up in [place], and his grandmother raised him.  He doesn't have any family so he sometimes calls her.  Social worker said that patient told her that he has a legal guardian, she believed, possibly through [employer].     Allyssa Abruzzese, LCSWA 09/08/2023
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
## 2023-09-08 NOTE — [facility] Suicide Risk Assessment (Signed)
 [facility] Discharge Suicide Risk Assessment   Principal Problem: Psychosis [facility]) Discharge Diagnoses: Principal Problem:   Psychosis (HCC)   Total Time spent with patient: 30 minutes  Musculoskeletal: Strength & Muscle Tone: within normal limits Gait & Station: normal Patient leans: N/A  Psychiatric Specialty Exam  Presentation  General Appearance:  Appropriate for Environment; Casual  Eye Contact: Fair  Speech: Clear and Coherent  Speech Volume: Normal  Handedness: Right   Mood and Affect  Mood: Irritable  Duration of Depression Symptoms: N/A  Affect: Labile   Thought Process  Thought Processes: Irrevelant  Descriptions of Associations:Intact  Orientation:Full (Time, Place and Person)  Thought Content:Logical  History of Schizophrenia/Schizoaffective disorder:Yes  Duration of Psychotic Symptoms:No data recorded Hallucinations:Hallucinations: None  Ideas of Reference:None  Suicidal Thoughts:Suicidal Thoughts: No  Homicidal Thoughts:Homicidal Thoughts: No   Sensorium  Memory: Immediate Fair; Recent Fair; Remote Fair  Judgment: Fair  Insight: Fair   Art therapist  Concentration: Fair  Attention Span: Fair  Recall: [employer] of Knowledge: Fair  Language: Fair   Psychomotor Activity  Psychomotor Activity: Psychomotor Activity: Normal   Assets  Assets: Communication Skills; Desire for Improvement   Sleep  Sleep: Sleep: Fair  Estimated Sleeping Duration (Last 24 Hours): 3.75-5.75 hours  Physical Exam: Physical Exam ROS Blood pressure (!) 143/77, pulse 98, temperature 97.7 F (36.5 C), resp. rate 17, height 5' 7 (1.702 m), weight 63.3 kg, SpO2 99%. Body mass index is 21.85 kg/m.  Mental Status Per Nursing Assessment::   On Admission:  NA  Demographic Factors:  Male  Loss Factors: Decrease in vocational status  Historical Factors: Impulsivity  Risk Reduction Factors:   Positive social support,  Positive therapeutic relationship, and Positive coping skills or problem solving skills  Continued Clinical Symptoms:  Schizophrenia:   Paranoid or undifferentiated type  Cognitive Features That Contribute To Risk:  None    Suicide Risk:  Minimal: No identifiable suicidal ideation.  Patients presenting with no risk factors but with morbid ruminations; may be classified as minimal risk based on the severity of the depressive symptoms    Plan Of Care/Follow-up recommendations:  Activity:  As tolerated  Allyn Foil, MD 09/08/2023, 11:12 AM

## 2023-09-08 NOTE — Group Note (Signed)
 Date:  09/08/2023 Time:  10:42 AM  Group Topic/Focus:  Goals Group:   The focus of this group is to help patients establish daily goals to achieve during treatment and discuss how the patient can incorporate goal setting into their daily lives to aide in recovery. We discussed our monthly goal and goals are important for people their age.    Participation Level:  Active  Participation Quality:  Appropriate  Affect:  Appropriate  Cognitive:  Appropriate  Insight: Appropriate  Engagement in Group:  Engaged  Modes of Intervention:  Activity  Additional Comments:    Leigh VEAR Pais 09/08/2023, 10:42 AM

## 2023-09-08 NOTE — Tx Team (Signed)
 Initial Treatment Plan 09/08/2023 1:05 PM [name] FMW:[id number]    PATIENT STRESSORS: Financial difficulties     PATIENT STRENGTHS: Religious Affiliation    PATIENT IDENTIFIED PROBLEMS:   Stress                    DISCHARGE CRITERIA:  Safe-care adequate arrangements made  PRELIMINARY DISCHARGE PLAN: Attend aftercare/continuing care group  PATIENT/FAMILY INVOLVEMENT: This treatment plan has been presented to and reviewed with the patient, [name],  The patient and family have been given the opportunity to ask questions and make suggestions.  [name] B Meri Pelot, RN 09/08/2023, 1:05 PM

## 2023-09-08 NOTE — Progress Notes (Signed)
(  Sleep Hours) -5.75 (Any PRNs that were needed, meds refused, or side effects to meds)- Zyprexa  5 mg for moderate agitation  (Any disturbances and when (visitation, over night)-pt at desk several times during the night cursing at staff, responding to internal stimuli - able to be redirected (Concerns raised by the patient)- preoccupied with discharge (SI/HI/AVH)- denies

## 2023-09-08 NOTE — Progress Notes (Signed)
   09/08/23 1100  Psych Admission Type (Psych Patients Only)  Admission Status Involuntary  Psychosocial Assessment  Patient Complaints Anxiety;Hyperactivity;Irritability;Restlessness;Worrying  Eye Contact Intense  Facial Expression Animated  Affect Anxious;Irritable;Labile;Preoccupied  Speech Logical/coherent;Rapid;Pressured  Interaction Intrusive;Demanding;Needy  Motor Activity Pacing  Appearance/Hygiene Layered clothes;Disheveled  Behavior Characteristics Cooperative;Anxious;Fidgety  Mood Labile;Preoccupied;Irritable;Pleasant  Thought Process  Coherency Disorganized  Content Preoccupation;Paranoia  Delusions Paranoid  Perception Hallucinations  Hallucination Auditory  Judgment Poor  Confusion Mild  Danger to Self  Current suicidal ideation? Denies  Danger to Others  Danger to Others Reported or observed  Danger to Others Abnormal  Harmful Behavior to others No threats or harm toward other people  Destructive Behavior No threats or harm toward property   Patient discharged in care of [facility] to [facility]. All discharge instructions given to sheriff with commitment papers. All daytime medications given prior to discharge.

## 2023-09-08 NOTE — Plan of Care (Signed)
  Problem: Education: Goal: Mental status will improve Outcome: Progressing   Problem: Activity: Goal: Interest or engagement in activities will improve Outcome: Progressing   Problem: Physical Regulation: Goal: Ability to maintain clinical measurements within normal limits will improve Outcome: Progressing   Problem: Safety: Goal: Periods of time without injury will increase Outcome: Progressing

## 2023-09-09 ENCOUNTER — Encounter (HOSPITAL_COMMUNITY): Payer: Self-pay

## 2023-09-09 MED ORDER — RISPERIDONE 1 MG PO TABS
1.0000 mg | ORAL_TABLET | Freq: Two times a day (BID) | ORAL | Status: DC
  Administered 2023-09-09 – 2023-09-10 (×2): 1 mg via ORAL
  Filled 2023-09-09 (×2): qty 1

## 2023-09-09 MED ORDER — NICOTINE POLACRILEX 2 MG MT GUM: 2.0000 mg | CHEWING_GUM | OROMUCOSAL | Status: DC | PRN

## 2023-09-09 NOTE — Plan of Care (Signed)
   Problem: Education: Goal: Emotional status will improve Outcome: Not Progressing Goal: Mental status will improve Outcome: Not Progressing Goal: Verbalization of understanding the information provided will improve Outcome: Not Progressing

## 2023-09-09 NOTE — Progress Notes (Signed)
 Patient became agitated while speaking with CSW. Patient verbally threatened CSW. Patient asked to approach med window for PO medication and at that time patient began posturing and threatening staff. IM agitation protocol administered, per MAR. No adverse reactions noted. Patient remains safe at this time.

## 2023-09-09 NOTE — Progress Notes (Addendum)
 Collateral contact - [name] [employer] (legal guardian) [phone]  11:00 AM - CSW left a Engineer, technical sales for [employer].  1:00 PM - CSW received a call from Cassandra Massen.  Patient came to the hospital because he was loud in the group home at night, and couldn't be calmed down.  Cassandra informed that patient lives in a group home:  [facility] - Rosana Later, Director ([facility]), [facility] - Rosana Later, Director, [address] [place], [place] [address].  Phone: [phone].  Mobile: [phone].  Cassandra gave verbal permission to speak with the group home.   CSW spoke with group home, [facility], [phone].  The owner Production designer, theatre/television/film) said that he gave a 30-day notice to the guardianship agency.  Patient will temporarily return to their group home.  He said that patient receives psychiatric service from [facility], Dr. Emile Dover (unsure if the first name is correct), [phone].  The provider visits patient in the group home.   12:45 PM - [name] [employer] (legal guardian) [phone] left a voicemail. 4:40 PM - CSW called him back.   Samiyyah Moffa, LCSWA 09/09/2023

## 2023-09-09 NOTE — BH IP Treatment Plan (Signed)
 Interdisciplinary Treatment and Diagnostic Plan Update  09/09/2023 Time of Session: 10:35 AM SEHAJ MCENROE MRN: [id number]  Principal Diagnosis: <principal problem not specified>  Secondary Diagnoses: Active Problems:   Schizoaffective disorder (HCC)   Current Medications:  Current Facility-Administered Medications  Medication Dose Route Frequency Provider Last Rate Last Admin   acetaminophen  (TYLENOL ) tablet 650 mg  650 mg Oral Q6H PRN Donnelly Mellow, MD       alum & mag hydroxide-simeth (MAALOX/MYLANTA) 200-200-20 MG/5ML suspension 30 mL  30 mL Oral Q4H PRN Jadapalle, Sree, MD       amLODipine  (NORVASC ) tablet 10 mg  10 mg Oral Daily Madaram, Kondal R, MD   10 mg at 09/09/23 9257   atorvastatin  (LIPITOR) tablet 40 mg  40 mg Oral QHS Madaram, Kondal R, MD   40 mg at 09/08/23 2032   carbamazepine  (TEGRETOL  XR) 12 hr tablet 200 mg  200 mg Oral BID Madaram, Kondal R, MD   200 mg at 09/09/23 1638   chlorproMAZINE  (THORAZINE ) tablet 200 mg  200 mg Oral QHS Madaram, Kondal R, MD   200 mg at 09/08/23 2033   clonazePAM  (KLONOPIN ) tablet 1 mg  1 mg Oral BID Madaram, Kondal R, MD   1 mg at 09/09/23 1637   diphenhydrAMINE  (BENADRYL ) capsule 50 mg  50 mg Oral [id number] Madaram, Kondal R, MD   50 mg at 09/09/23 1638   hydrOXYzine  (ATARAX ) tablet 25 mg  25 mg Oral TID PRN Jadapalle, Sree, MD   25 mg at 09/08/23 2032   losartan  (COZAAR ) tablet 100 mg  100 mg Oral Daily Madaram, Kondal R, MD   100 mg at 09/09/23 0741   magnesium  hydroxide (MILK OF MAGNESIA) suspension 30 mL  30 mL Oral Daily PRN Donnelly Mellow, MD       metoprolol  tartrate (LOPRESSOR ) tablet 25 mg  25 mg Oral BID Madaram, Kondal R, MD   25 mg at 09/09/23 1638   nicotine  polacrilex (NICORETTE ) gum 2 mg  2 mg Oral Q4H PRN Rollene Katz, MD       OLANZapine  (ZYPREXA ) injection 10 mg  10 mg Intramuscular TID PRN Jadapalle, Sree, MD   10 mg at 09/09/23 1528   OLANZapine  (ZYPREXA ) injection 5 mg  5 mg Intramuscular TID PRN Jadapalle,  Sree, MD       OLANZapine  zydis (ZYPREXA ) disintegrating tablet 5 mg  5 mg Oral TID PRN Jadapalle, Sree, MD   5 mg at 09/09/23 9068   risperiDONE  (RISPERDAL ) tablet 1 mg  1 mg Oral BID Rollene Katz, MD   1 mg at 09/09/23 1638   traZODone  (DESYREL ) tablet 100 mg  100 mg Oral QHS Madaram, Kondal R, MD   100 mg at 09/08/23 2032   traZODone  (DESYREL ) tablet 50 mg  50 mg Oral QHS PRN Jadapalle, Sree, MD       PTA Medications: Medications Prior to Admission  Medication Sig Dispense Refill Last Dose/Taking   amLODipine  (NORVASC ) 10 MG tablet Take 1 tablet (10 mg total) by mouth daily. 30 tablet 3    atorvastatin  (LIPITOR) 40 MG tablet Take 1 tablet (40 mg total) by mouth at bedtime. 30 tablet 3    carbamazepine  (TEGRETOL  XR) 200 MG 12 hr tablet Take 1 tablet (200 mg total) by mouth 2 (two) times daily. 60 tablet 3    chlorproMAZINE  (THORAZINE ) 200 MG tablet Take 1 tablet (200 mg total) by mouth at bedtime. 30 tablet 3    clonazePAM  (KLONOPIN ) 1 MG tablet Take 1 tablet (  1 mg total) by mouth 2 (two) times daily. 60 tablet 3    fluPHENAZine  (PROLIXIN ) 5 MG tablet Take 1 tablet (5 mg total) by mouth 2 (two) times daily at 8 am and 4 pm. 60 tablet 3    losartan  (COZAAR ) 100 MG tablet Take 1 tablet (100 mg total) by mouth daily. 30 tablet 3    metoprolol  tartrate (LOPRESSOR ) 25 MG tablet Take 1 tablet (25 mg total) by mouth 2 (two) times daily. 30 tablet 0    traZODone  (DESYREL ) 100 MG tablet Take 1 tablet (100 mg total) by mouth at bedtime. 30 tablet 3     Patient Stressors: Financial difficulties    Patient Strengths: Religious Affiliation   Treatment Modalities: Medication Management, Group therapy, Case management,  1 to 1 session with clinician, Psychoeducation, Recreational therapy.   Physician Treatment Plan for Primary Diagnosis: <principal problem not specified> Long Term Goal(s):     Short Term Goals: Ability to verbalize feelings will improve Ability to demonstrate self-control  will improve Ability to identify and develop effective coping behaviors will improve  Medication Management: Evaluate patient's response, side effects, and tolerance of medication regimen.  Therapeutic Interventions: 1 to 1 sessions, Unit Group sessions and Medication administration.  Evaluation of Outcomes: Not Progressing  Physician Treatment Plan for Secondary Diagnosis: Active Problems:   Schizoaffective disorder (HCC)  Long Term Goal(s):     Short Term Goals: Ability to verbalize feelings will improve Ability to demonstrate self-control will improve Ability to identify and develop effective coping behaviors will improve     Medication Management: Evaluate patient's response, side effects, and tolerance of medication regimen.  Therapeutic Interventions: 1 to 1 sessions, Unit Group sessions and Medication administration.  Evaluation of Outcomes: Not Progressing   RN Treatment Plan for Primary Diagnosis: <principal problem not specified> Long Term Goal(s): Knowledge of disease and therapeutic regimen to maintain health will improve  Short Term Goals: Ability to remain free from injury will improve, Ability to verbalize frustration and anger appropriately will improve, Ability to demonstrate self-control, Ability to participate in decision making will improve, Ability to verbalize feelings will improve, Ability to disclose and discuss suicidal ideas, Ability to identify and develop effective coping behaviors will improve, and Compliance with prescribed medications will improve  Medication Management: RN will administer medications as ordered by provider, will assess and evaluate patient's response and provide education to patient for prescribed medication. RN will report any adverse and/or side effects to prescribing provider.  Therapeutic Interventions: 1 on 1 counseling sessions, Psychoeducation, Medication administration, Evaluate responses to treatment, Monitor vital signs and  CBGs as ordered, Perform/monitor CIWA, COWS, AIMS and Fall Risk screenings as ordered, Perform wound care treatments as ordered.  Evaluation of Outcomes: Not Progressing   LCSW Treatment Plan for Primary Diagnosis: <principal problem not specified> Long Term Goal(s): Safe transition to appropriate next level of care at discharge, Engage patient in therapeutic group addressing interpersonal concerns.  Short Term Goals: Engage patient in aftercare planning with referrals and resources, Increase social support, Increase ability to appropriately verbalize feelings, Increase emotional regulation, Facilitate acceptance of mental health diagnosis and concerns, Facilitate patient progression through stages of change regarding substance use diagnoses and concerns, Identify triggers associated with mental health/substance abuse issues, and Increase skills for wellness and recovery  Therapeutic Interventions: Assess for all discharge needs, 1 to 1 time with Social worker, Explore available resources and support systems, Assess for adequacy in community support network, Educate family and significant other(s) on suicide prevention,  Complete Psychosocial Assessment, Interpersonal group therapy.  Evaluation of Outcomes: Not Progressing   Progress in Treatment: Attending groups: yes, attended some groups Participating in groups: Yes. Taking medication as prescribed: Yes. Toleration medication: Yes. Family/Significant other contact made: Yes, individual(s) contacted:  Cassandra Massen from [employer] guardianship agency (patient has a legal guardian) Patient understands diagnosis: No. Discussing patient identified problems/goals with staff: No. Medical problems stabilized or resolved: Yes. Denies suicidal/homicidal ideation: Yes. Issues/concerns per patient self-inventory: No.  New problem(s) identified:  No  New Short Term/Long Term Goal(s):    medication stabilization, elimination of SI  thoughts, development of comprehensive mental wellness plan.    Patient Goals:  I want you to call my guardian.     Discharge Plan or Barriers:  Patient recently admitted. CSW will continue to follow and assess for appropriate referrals and possible discharge planning.    Reason for Continuation of Hospitalization: Delusions  Medication stabilization Psychosis  Estimated Length of Stay:  5 - 7 days  Last 3 [place] Suicide Severity Risk Score: Flowsheet Row [facility] Most recent reading at 09/08/2023 12:47 PM ED from 09/05/2023 in [facility] Department at [facility] Most recent reading at 09/05/2023  1:35 PM Admission (Discharged) from 09/05/2023 in [facility] Most recent reading at 09/05/2023 12:00 PM  C-SSRS RISK CATEGORY No Risk No Risk No Risk    Last PHQ 2/9 Scores:     No data to display          Scribe for Treatment Team: Synia Douglass O Bradford Cazier, LCSWA 09/09/2023 7:12 PM
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
## 2023-09-09 NOTE — Progress Notes (Signed)
(  Sleep Hours) -5.75  (Any PRNs that were needed, meds refused, or side effects to meds)- Vistaril  25 mg , Trazodone  100 mg  (Any disturbances and when (visitation, over night)- N/A  (Concerns raised by the patient)- pt continues to be focused on D/C , writer tried to explain process, but pt got agitated and argumentative  (SI/HI/AVH)-denies
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
## 2023-09-09 NOTE — Group Note (Signed)
 Date:  09/09/2023 Time:  8:29 PM  Group Topic/Focus:  Wrap-Up Group:   The focus of this group is to help patients review their daily goal of treatment and discuss progress on daily workbooks.    Participation Level:  Active  Participation Quality:  Appropriate  Affect:  Appropriate  Cognitive:  Appropriate  Insight: Appropriate  Engagement in Group:  Engaged  Modes of Intervention:  Education and Exploration  Additional Comments:  Patient attended and participated in group tonight. He reports that the best thing that happened for him was when he was told he was going to be discharged.  Gwenn Chillington Dacosta 09/09/2023, 8:29 PM

## 2023-09-09 NOTE — Group Note (Signed)
 Date:  09/09/2023 Time:  1:52 AM  Group Topic/Focus:  Wrap-Up Group:   The focus of this group is to help patients review their daily goal of treatment and discuss progress on daily workbooks.    Participation Level:  Did Not Attend  Participation Quality:  N/A  Affect:  N/A  Cognitive:  N/A  Insight: None  Engagement in Group:  N/A  Modes of Intervention:  N/A  Additional Comments:  Patient was encouraged but did not attend  Eward Mace 09/09/2023, 1:52 AM

## 2023-09-09 NOTE — BHH Counselor (Signed)
 Adult Comprehensive Assessment  Patient ID: [name], male   DOB: [birth year], 63 y.o.   MRN: [id number]  Information Source: Information source: Patient  Current Stressors:  Patient states their primary concerns and needs for treatment are:: other patients were loud, were arguying and fighting staff.  I got out from [facility] ([facility] Rehabilitation / day program) and I was smoking a cigarette, I wasn't doing anything.  Someone told a lie.  My phone was disconnected because I didn't pay the bill and I didn't have my cell-phone. Patient states their goals for this hospitilization and ongoing recovery are:: I just want to be discharged back to my group home.  I will take my medications. Educational / Learning stressors: no Employment / Job issues: no Family Relationships: no Surveyor, quantity / Lack of resources (include bankruptcy): I have a payee, Lonni Drones. Housing / Lack of housing: no, I live in a family care home in [place]. Physical health (include injuries & life threatening diseases): no Social relationships: I have friends Substance abuse: no.  I smoke cigarettes.  I don't use drugs or consume drink. Bereavement / Loss: no  Living/Environment/Situation:  Living conditions (as described by patient or guardian): positive Who else lives in the home?: There are 5 other residents in the group home. How long has patient lived in current situation?: I've been there for a year and 2 months. What is atmosphere in current home: Supportive, Comfortable  Family History:  Marital status: Single Are you sexually active?: Yes What is your sexual orientation?: I like females Has your sexual activity been affected by drugs, alcohol, medication, or emotional stress?: emotional stress Does patient have children?: No  Childhood History:  By whom was/is the patient raised?: Mother Additional childhood history information: My mother Fronie and her  husband came there to adopt me. Description of patient's relationship with caregiver when they were a child: good Patient's description of current relationship with people who raised him/her: They passed away; one was 78, and she was 81. How were you disciplined when you got in trouble as a child/adolescent?: nothing much Does patient have siblings?: No Did patient suffer any verbal/emotional/physical/sexual abuse as a child?: No Did patient suffer from severe childhood neglect?: No Has patient ever been sexually abused/assaulted/raped as an adolescent or adult?: No Was the patient ever a victim of a crime or a disaster?: No Witnessed domestic violence?: No Has patient been affected by domestic violence as an adult?: No  Education:  Highest grade of school patient has completed: I have completed 1 semester of college.  I have a 2-year associate degree from a community college in early childhood and psychiatry. Currently a student?: No Learning disability?: No  Employment/Work Situation:   Employment Situation: On disability Why is Patient on Disability: I worked but I became sick.  I had 5 jobs.  I had a bad reaction to medications. 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 Has Patient ever Been in the [employer]?: No  Financial Resources:   Financial resources: [employer], Medicare, [place] Does patient have a representative payee or guardian?: Yes Name of representative payee or guardian: [name] from [facility]  Alcohol/Substance Abuse:   What has been your use  of drugs/alcohol within the last 12 months?: I just smoke cigarettes. If attempted suicide, did drugs/alcohol play a role in this?: No If  yes, describe treatment: no Has alcohol/substance abuse ever caused legal problems?: No  Social Support System:   Patient's Community Support System: Good Describe Community Support System: Vinie from [employer] guardianshp agency. Type of faith/religion: I'm both Catholic and [employer]. How does patient's faith help to cope with current illness?: I pray  Leisure/Recreation:   Do You Have Hobbies?: Yes Leisure and Hobbies: I enjoy walking, going to [employer], and my day program.  I enjoy trips.  Strengths/Needs:   What is the patient's perception of their strengths?: I love my mother Patient states they can use these personal strengths during their treatment to contribute to their recovery: working in partnerhsip with professionals Patient states these barriers may affect/interfere with their treatment: none reported Patient states these barriers may affect their return to the community: none reported Other important information patient would like considered in planning for their treatment: none reported  Discharge Plan:   Currently receiving community mental health services: No Patient states concerns and preferences for aftercare planning are: Doctor comes to the house Patient states they will know when they are safe and ready for discharge when: I want to be discharged today Does patient have access to transportation?: Yes Does patient have financial barriers related to discharge medications?: Yes Patient description of barriers related to discharge medications: N/A Will patient be returning to same living situation after discharge?: Yes  Summary/Recommendations:   Summary and Recommendations (to be completed by the evaluator): Patient is a 63 year old man involuntarily admitted to [facility] due to delusions and agression.  He has been verbally aggressive with patient and staff.  He was making threats and destroying property.  During the assessment, he  patiently answered questions, though his thought process appeared cocrete.  He was preoccupied with discharge.  his He said that he ha a legal guardian, [name] from [facility] [employer] guardianship agency.  He said that he lives in a group home with other residents, where he would like to return upon discharge.  He mentioned that people, including his mother, came to adopt him when he was a child, but didn't specify whether he was ultimately adopted.  He said he caregivers passed away, and he loved his mother.  Patient denied any substance use, however admitted to smoking cigarettes.  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  He said he attends a day program, and his doctor comes to the home.  He said he doesn't have any guns or weapons.  While here, Keiran Gaffey can benefit from crisis stabilization, medication management, therapeutic milieu, and referrals for services.   Tru Leopard O Maxime Beckner, LCSWA 09/09/2023

## 2023-09-09 NOTE — Progress Notes (Signed)
 Recreation Therapy Notes  INPATIENT RECREATION THERAPY ASSESSMENT  Patient Details Name: [name] MRN: [id number] DOB: 1960/12/16 Today's Date: 09/09/2023       Information Obtained From: Patient  Able to Participate in Assessment/Interview: Yes  Patient Presentation: Alert  Reason for Admission (Per Patient): Aggressive/Threatening  Patient Stressors: Other (Comment) (None identified)  Coping Skills:   Journal, TV, Sports, Deep Breathing, Meditate, Exercise, Music, Art, Talk, Prayer, Avoidance, Dance, Read, Hot Bath/Shower  Leisure Interests (2+):  Exercise - Walking, Individual - Reading, Individual - TV, Games - Video games, Individual - Other (Comment) Geographical information systems officer; Movies)  Frequency of Recreation/Participation: Other (Comment) (Daily)  Awareness of Community Resources:  Yes  Community Resources:  Library, Other (Comment) (PSR)  Current Use: Yes  If no, Barriers?:    Expressed Interest in [employer] Information: No  County of Residence:  Scientist, clinical (histocompatibility and immunogenetics)  Patient Main Form of Transportation: Other (Comment) [place])  Patient Strengths:  Library; Education; Meditation  Patient Identified Areas of Improvement:  Coping with not chain smoking  Patient Goal for Hospitalization:  be discharged  Current SI (including self-harm):  No  Current HI:  No  Current AVH: No  Staff Intervention Plan: Group Attendance, Collaborate with Interdisciplinary Treatment Team  Consent to Intern Participation: N/A   Gwendoline Judy-McCall, LRT,CTRS Laxmi Choung A Areej Tayler-McCall 09/09/2023, 3:23 PM

## 2023-09-09 NOTE — Group Note (Signed)
 Recreation Therapy Group Note   Group Topic:Problem Solving  Group Date: 09/09/2023 Start Time: 1015 End Time: 1055 Facilitators: Theodus Ran-McCall, LRT,CTRS Location: 500 Hall Dayroom   Group Topic: Communication, Team Building, Problem Solving  Goal Area(s) Addresses:  Patient will effectively work with peer towards shared goal.  Patient will identify skills used to make activity successful.  Patient will identify how skills used during activity can be used to reach post d/c goals.   Behavioral Response: Engaged  Intervention: STEM Activity  Activity: Stage manager. In teams of 3-5, patients were given 12 plastic drinking straws and an equal length of masking tape. Using the materials provided, patients were asked to build a landing pad to catch a golf ball dropped from approximately 5 feet in the air. All materials were required to be used by the team in their design. LRT facilitated post-activity discussion.  Education: Pharmacist, community, Scientist, physiological, Discharge Planning   Education Outcome: Acknowledges education/In group clarification offered/Needs additional education.    Affect/Mood: Appropriate   Participation Level: Engaged   Participation Quality: Independent   Behavior: Appropriate   Speech/Thought Process: Focused   Insight: Good   Judgement: Good   Modes of Intervention: STEM Activity   Patient Response to Interventions:  Engaged   Education Outcome:  In group clarification offered    Clinical Observations/Individualized Feedback: Pt was engaged in group. Pt was vocal and hands on throughout. Pt was called out by Child psychotherapist but returned right before his peers attempted to see if their landing pad worked.      Plan: Continue to engage patient in RT group sessions 2-3x/week.   [name], LRT,CTRS 09/09/2023 1:26 PM
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
## 2023-09-09 NOTE — Plan of Care (Signed)

## 2023-09-09 NOTE — Progress Notes (Signed)
 Writer informed of notice of commitment change. Assigned RN aware. Unit NS aware. IP BH encounter is now voluntary.

## 2023-09-09 NOTE — Progress Notes (Signed)
(  Sleep Hours) - 3.5 hrs (Any PRNs that were needed, meds refused, or side effects to meds)- Atarax  (Any disturbances and when (visitation, over night) (Concerns raised by the patient)-  none (SI/HI/AVH)- denies

## 2023-09-10 LAB — HEMOGLOBIN A1C
Hgb A1c MFr Bld: 4.7 % — ABNORMAL LOW (ref 4.8–5.6)
Mean Plasma Glucose: 88.19 mg/dL

## 2023-09-10 LAB — LIPID PANEL
Cholesterol: 156 mg/dL (ref 0–200)
HDL: 49 mg/dL (ref >40)
LDL Cholesterol: 91 mg/dL (ref 0–99)
Total CHOL/HDL Ratio: 3.2 ratio
Triglycerides: 79 mg/dL (ref <150)
VLDL: 16 mg/dL (ref 0–40)

## 2023-09-10 LAB — TSH: TSH: 0.551 u[IU]/mL (ref 0.350–4.500)

## 2023-09-10 MED ORDER — RISPERIDONE 2 MG PO TABS
2.0000 mg | ORAL_TABLET | Freq: Two times a day (BID) | ORAL | Status: DC
  Administered 2023-09-10 – 2023-09-12 (×4): 2 mg via ORAL
  Filled 2023-09-10 (×4): qty 1

## 2023-09-10 MED ORDER — CLONAZEPAM 0.5 MG PO TABS
1.0000 mg | ORAL_TABLET | Freq: Three times a day (TID) | ORAL | Status: DC
  Administered 2023-09-10 – 2023-09-15 (×14): 1 mg via ORAL
  Filled 2023-09-10 (×15): qty 2

## 2023-09-10 NOTE — Progress Notes (Signed)
 [facility] MD Progress Note  09/10/2023 2:42 PM [name]  MRN:  [id number]  Principal Problem: <principal problem not specified> Diagnosis: Active Problems:   Schizoaffective disorder [facility])   Reason for Admission:  [name] is a 63 y.o. male  with a past psychiatric history of severe schizoaffective disorder, bipolar type. Was hospitalized at [facility] for two months in 2024, lives in [facility] group home, and is under guardianship [name] empowering lives). Patient initially arrived to [facility] on 8/30 from [facility] for delusional thinking and aggressive behavior. Was admitted to the gero-psych unit at [facility] 8/30 where these behaviors continued: hollering in the hallway, entering other patient's rooms, verbally threatening security staff, attempting to punch an RN. Was then Transferred to [facility] under IVC on 09/09/2023 to the 500-level unit as his behaviors demanded a higher level of care. PMHx is significant for hypertension.  (admitted on 09/08/2023, total  LOS: 2 days )   Overnight: Slept 5.75. Continued to be intrusive and perseverative on discharge but did not require PRN agitation medications. No med refusals. No acute events.    On interview: Patient is very respectful with this provider, almost meek. Thanks Clinical research associate repeatedly for working with him and promises not to cause disturbances on the unit: I can be patient. Let patient know that guardian is looking for new places and that this hospitalization can be a good way to transition from one place to the other while getting him on the right medications. Says he is doing well, better. Says that he has a chemical balance now and is on the right dose of medications. Denies SI, HI, AVH. No medication side effects.    Past Psychiatric History: Current psychiatrist: Dr. Emile Dover from [facility], visits  Current therapist: None Previous psychiatric diagnoses: Schizoaffective disorder, bipolar type.  Current  psychiatric medications: tegretol  200 BID, thorazine  200 mg at bedtime, klonopin  1 mg BID, prolixin  5 mg BID, Trazodone  100 mg at bedtime  Psychiatric medication history/compliance: Patient reports compliance recently, per chart review unclear if he was reciving his medications.  Psychiatric hospitalization(s): a few times most recently at [facility] 08/10/2022-10/02/2022 Psychotherapy history: Unknown Neuromodulation history: Unknown History of suicide (obtained from HPI): Denies presently, none noted in recent chart review History of homicide or aggression (obtained in HPI): Patient has a history of violence against staff, has recently been throwing furniture, trying to punch staff, banging on windows.    Substance Abuse History:   None, as above.   Past Medical History:   HTN, per chart. Otherwise, patient denies.    Social History: Living situation: Group home as above Education: Some college Occupational history: Unknown Marital status: Single. Children: Unknown Legal: None presently Military: None   Access to firearms: None, lives at group home.    Family Psychiatric History:   Patient denies   Family Medical History:   None pertinent Past Medical History:  Past Medical History:  Diagnosis Date   Hypertension    Schizoaffective disorder, bipolar type (HCC)    Schizophrenia (HCC)    Family History: History reviewed. No pertinent family history.  Current Medications: Current Facility-Administered Medications  Medication Dose Route Frequency Provider Last Rate Last Admin   acetaminophen  (TYLENOL ) tablet 650 mg  650 mg Oral Q6H PRN Jadapalle, Sree, MD       alum & mag hydroxide-simeth (MAALOX/MYLANTA) 200-200-20 MG/5ML suspension 30 mL  30 mL Oral Q4H PRN Jadapalle, Sree, MD       amLODipine  (NORVASC ) tablet  10 mg  10 mg Oral Daily Madaram, Kondal R, MD   10 mg at 09/10/23 0849   atorvastatin  (LIPITOR) tablet 40 mg  40 mg Oral QHS Madaram, Kondal R, MD   40 mg at 09/09/23  2020   carbamazepine  (TEGRETOL  XR) 12 hr tablet 200 mg  200 mg Oral BID Madaram, Kondal R, MD   200 mg at 09/10/23 0848   chlorproMAZINE  (THORAZINE ) tablet 200 mg  200 mg Oral QHS Madaram, Kondal R, MD   200 mg at 09/09/23 2018   clonazePAM  (KLONOPIN ) tablet 1 mg  1 mg Oral TID Rollene Katz, MD       diphenhydrAMINE  (BENADRYL ) capsule 50 mg  50 mg Oral [id number] Madaram, Kondal R, MD   50 mg at 09/10/23 0848   hydrOXYzine  (ATARAX ) tablet 25 mg  25 mg Oral TID PRN Jadapalle, Sree, MD   25 mg at 09/10/23 1304   losartan  (COZAAR ) tablet 100 mg  100 mg Oral Daily Madaram, Kondal R, MD   100 mg at 09/10/23 0849   magnesium  hydroxide (MILK OF MAGNESIA) suspension 30 mL  30 mL Oral Daily PRN Donnelly Mellow, MD       metoprolol  tartrate (LOPRESSOR ) tablet 25 mg  25 mg Oral BID Madaram, Kondal R, MD   25 mg at 09/10/23 9150   nicotine  polacrilex (NICORETTE ) gum 2 mg  2 mg Oral Q4H PRN Rollene Katz, MD       OLANZapine  (ZYPREXA ) injection 10 mg  10 mg Intramuscular TID PRN Jadapalle, Sree, MD   10 mg at 09/09/23 1528   OLANZapine  (ZYPREXA ) injection 5 mg  5 mg Intramuscular TID PRN Jadapalle, Sree, MD       OLANZapine  zydis (ZYPREXA ) disintegrating tablet 5 mg  5 mg Oral TID PRN Jadapalle, Sree, MD   5 mg at 09/09/23 9068   risperiDONE  (RISPERDAL ) tablet 2 mg  2 mg Oral BID Rollene Katz, MD       traZODone  (DESYREL ) tablet 100 mg  100 mg Oral QHS Madaram, Kondal R, MD   100 mg at 09/09/23 2020   traZODone  (DESYREL ) tablet 50 mg  50 mg Oral QHS PRN Jadapalle, Sree, MD        Lab Results:  Results for orders placed or performed during the hospital encounter of 09/08/23 (from the past 48 hours)  TSH     Status: None   Collection Time: 09/10/23  6:19 AM  Result Value Ref Range   TSH 0.551 0.350 - 4.500 uIU/mL    Comment: Performed at [facility], [address], [place], [place] [address]  Hemoglobin A1c     Status: Abnormal   Collection Time: 09/10/23  6:19 AM   Result Value Ref Range   Hgb A1c MFr Bld 4.7 (L) 4.8 - 5.6 %    Comment: (NOTE) Diagnosis of Diabetes The following HbA1c ranges recommended by the American Diabetes Association (ADA) may be used as an aid in the diagnosis of diabetes mellitus.  Hemoglobin             Suggested A1C NGSP%              Diagnosis  <5.7                   Non Diabetic  5.7-6.4                Pre-Diabetic  >6.4  Diabetic  <7.0                   Glycemic control for                       adults with diabetes.     Mean Plasma Glucose 88.19 mg/dL    Comment: Performed at [facility], [address] [place], [place] [address]  Lipid panel     Status: None   Collection Time: 09/10/23  6:19 AM  Result Value Ref Range   Cholesterol 156 0 - 200 mg/dL    Comment:        ATP III CLASSIFICATION:  <200     mg/dL   Desirable  799-760  mg/dL   Borderline High  >=759    mg/dL   High           Triglycerides 79 <150 mg/dL   HDL 49 >59 mg/dL   Total CHOL/HDL Ratio 3.2 RATIO   VLDL 16 0 - 40 mg/dL   LDL Cholesterol 91 0 - 99 mg/dL    Comment:        Total Cholesterol/HDL:CHD Risk Coronary Heart Disease Risk Table                     Men   Women  1/2 Average Risk   3.4   3.3  Average Risk       5.0   4.4  2 X Average Risk   9.6   7.1  3 X Average Risk  23.4   11.0        Use the calculated Patient Ratio above and the CHD Risk Table to determine the patient's CHD Risk.        ATP III CLASSIFICATION (LDL):  <100     mg/dL   Optimal  899-870  mg/dL   Near or Above                    Optimal  130-159  mg/dL   Borderline  839-810  mg/dL   High  >809     mg/dL   Very High Performed at [facility], [address], [place], [place] [address]     Blood Alcohol level:  Lab Results  Component Value Date   [facility] <15 09/04/2023   ETH <10 08/06/2022    Metabolic Labs: Lab Results  Component Value Date   HGBA1C 4.7 (L) 09/10/2023   MPG 88.19 09/10/2023    MPG 103 08/16/2022   No results found for: PROLACTIN Lab Results  Component Value Date   CHOL 156 09/10/2023   TRIG 79 09/10/2023   HDL 49 09/10/2023   CHOLHDL 3.2 09/10/2023   VLDL 16 09/10/2023   LDLCALC 91 09/10/2023   LDLCALC 84 08/16/2022    Physical Findings: AIMS: No  CIWA:    COWS:     Psychiatric Specialty Exam:  Presentation  General Appearance: Appropriate for Environment; Casual  Eye Contact:Fair  Speech:Clear and Coherent  Speech Volume:Normal  Handedness:Right   Mood and Affect  Mood:Irritable  Affect:Labile   Thought Process  Thought Processes:Irrevelant  Descriptions of Associations:Intact  Orientation:Full (Time, Place and Person)  Thought Content:Logical  History of Schizophrenia/Schizoaffective disorder:Yes  Duration of Psychotic Symptoms:No data recorded Hallucinations:No data recorded Ideas of Reference:None  Suicidal Thoughts:No data recorded Homicidal Thoughts:No data recorded  Sensorium  Memory:Immediate Fair; Recent Fair; Remote Fair  Judgment:Fair  Insight:Fair  Executive Functions  Concentration:Fair  Attention Span:Fair  Recall:Fair  [employer] of Knowledge:Fair  Language:Fair   Psychomotor Activity  Psychomotor Activity:No data recorded  Assets  Assets:Communication Skills; Desire for Improvement   Sleep  Sleep:No data recorded   Physical Exam: Physical Exam ROS Blood pressure 97/72, pulse 98, temperature 98 F (36.7 C), temperature source Oral, resp. rate 18, height 5' 7 (1.702 m), weight 64.8 kg, SpO2 98%. Body mass index is 22.37 kg/m.  Treatment Plan Summary: Daily contact with patient to assess and evaluate symptoms and progress in treatment and Medication management     ASSESSMENT:    Diagnoses / Active Problems: Schizoaffective disorder, bipolar type, in current episode.  [name] is a 63 y.o. male  with a past psychiatric history of severe schizoaffective disorder,  bipolar type. Was hospitalized at [facility] for two months in 2024, lives in [facility] group home, and is under guardianship [name] empowering lives). Patient initially arrived to [facility] from [facility] for delusional thinking and aggressive behavior. Was admitted to the gero-psych unit at [facility] 8/30 where these behaviors continued: hollering in the hallway, entering other patient's rooms, verbally threatening security staff, attempting to punch an RN. Was then Transferred to [facility] under IVC on 09/09/2023 to the 500-level unit as his behaviors demanded a higher level of care. PMHx is significant for hypertension.   More organized on my exam this morning, oriented, willing to be patient and to not disturb others. However, continues to be intrusive with attending. Has been repeatedly calling crisis hotline of his guardian agency. Slept slightly more last night 3 --> 5.75 hours, but continues to be focused on discharge, intrusive. Is in process of being evicted from home, understands that guardian is finding a new place to live. Will increase Risperdal  1 BID --> 1mg /2mg  today and then 2mg  BID tomorrow. Would like Invega  LAI, and we are more than happy to give it to him. Plan before DC.   PLAN:   Safety and Monitoring: -  VOLUNTARY with guardian consent, admission to inpatient psychiatric unit for safety, stabilization and treatment. - Daily contact with patient to assess and evaluate symptoms and progress in treatment - Patient's case to be discussed in multi-disciplinary team meeting -  Observation Level : q15 minute checks -  Vital signs:  q12 hours -  Precautions: suicide, elopement, and assault   2. Psychiatric Diagnoses and Treatment:     # Schizoaffective disorder, bipolar type - Continue home thorazine  200 mg at bedtime for psychosis. - Increase Risperdal  1 mg BID --> 2 mg BID to target manic symptomatology with eye toward [facility]  LAI.   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 - PRNs: Olanzapine  zydis 5 mg TID PRN for mild agitation, IM Olanzapine  5 mg TID PRN for moderate agitation, and IM Olanzapine  10 mg TID PRN for severe agitation. Consider alternative PRNs  - Continue benadryl  50 mg BID - The risks/benefits/side-effects/alternatives to this medication were discussed in detail with the patient and time was given for questions. The patient consents to medication trial.  - Metabolic profile and EKG monitoring obtained while on an atypical antipsychotic  BMI: 22 TSH: WNL Lipid panel: WNL HbgA1c: 4.7% QTc: 442  - Encouraged patient to participate in unit milieu and in scheduled group therapies  - Short Term Goals: Ability to verbalize feelings will improve, Ability to demonstrate  self-control will improve, and Ability to identify and develop effective coping behaviors will improve - Long Term Goals: Improvement in symptoms so as ready for discharge   Other PRNS: agitation as above, anxiety, sleep, constipation    Other labs reviewed on admission:                3. Medical Issues Being Addressed:    # Tobacco Use Disorder  - Nicotine  gum ordered - Smoking cessation encouraged.   4. Discharge Planning:    - Estimated discharge date: 5-8 days  - Social work and case management to assist with discharge planning and identification of hospital follow-up needs prior to discharge. - Discharge concerns: Need to establish a safety plan; medication compliance and effectiveness. - Discharge goals: Return home with outpatient referrals for mental health follow-up including medication management/psychotherapy.   I certify that inpatient services furnished can reasonably be expected to improve the patient's condition.     NB: This note was created using a voice recognition software as a result there may be grammatical errors inadvertently enclosed  that do not reflect the nature of this encounter. Every attempt is made to correct such errors.   Odis Cleveland, MD PGY-2, Psychiatry Residency  9/4/20252:42 PM

## 2023-09-10 NOTE — Group Note (Signed)
 Date:  09/10/2023 Time:  8:53 PM  Group Topic/Focus:  Wrap-Up Group:   The focus of this group is to help patients review their daily goal of treatment and discuss progress on daily workbooks.    Participation Level:  Did Not Attend  Benigno Check Dacosta 09/10/2023, 8:53 PM

## 2023-09-10 NOTE — Plan of Care (Signed)
  Problem: Education: Goal: Emotional status will improve Outcome: Progressing   Problem: Activity: Goal: Interest or engagement in activities will improve Outcome: Progressing   Problem: Safety: Goal: Periods of time without injury will increase Outcome: Progressing

## 2023-09-10 NOTE — Group Note (Signed)
 Occupational Therapy Group Note  Group Topic: Sleep Hygiene  Group Date: 09/10/2023 Start Time: 1530 End Time: 1600 Facilitators: Dot Dallas MATSU, OT   Group Description: Group encouraged increased participation and engagement through topic focused on sleep hygiene. Patients reflected on the quality of sleep they typically receive and identified areas that need improvement. Group was given background information on sleep and sleep hygiene, including common sleep disorders. Group members also received information on how to improve one's sleep and introduced a sleep diary as a tool that can be utilized to track sleep quality over a length of time. Group session ended with patients identifying one or more strategies they could utilize or implement into their sleep routine in order to improve overall sleep quality.        Therapeutic Goal(s):  Identify one or more strategies to improve overall sleep hygiene  Identify one or more areas of sleep that are negatively impacted (sleep too much, too little, etc)     Participation Level: Hyperverbal   Participation Quality: Moderate Cues   Behavior: Hyperverbal   Speech/Thought Process: Disorganized, Loose association , Pressured, and Tangential    Affect/Mood: Appropriate   Insight: Limited   Judgement: Limited      Modes of Intervention: Education  Patient Response to Interventions:  Engaged   Plan: Continue to engage patient in OT groups 2 - 3x/week.  09/10/2023  D[place] MATSU Dot, OT  [name], OT

## 2023-09-10 NOTE — Progress Notes (Signed)
 Patient emerged from his bedroom and inquired about using the telephone and finding a number off of the Internet. Patient attempted to call [facility]) to speak with him regarding housing despite this late hour. He also wanted to call someone by the name of Coach but agreed to call tomorrow morning.

## 2023-09-10 NOTE — Progress Notes (Signed)
   09/10/23 1600  Psych Admission Type (Psych Patients Only)  Admission Status Voluntary  Psychosocial Assessment  Patient Complaints Irritability;Restlessness  Eye Contact Intense  Facial Expression Anxious  Affect Preoccupied  Radio broadcast assistant;Restless  Appearance/Hygiene In scrubs  Behavior Characteristics Irritable  Mood Labile  Thought Process  Coherency Disorganized  Content Preoccupation  Delusions Paranoid  Perception WDL  Hallucination None reported or observed  Judgment Impaired  Confusion Mild  Danger to Self  Current suicidal ideation? Denies  Danger to Others  Danger to Others None reported or observed   Dar Note: Patient presents with irritable affect and mood.  Denies suicidal thoughts but observed talking to self, argumentative with staff.  Blaming and accusing staff of being mean and racist.  PRN Vistaril  given for anxiety with good effect.  Routing safety checks maintained.  Patient is safe on the unit.

## 2023-09-10 NOTE — Group Note (Signed)
 Date:  09/10/2023 Time:  6:33 PM  Group Topic/Focus:  Goals Group:   The focus of this group is to help patients establish daily goals to achieve during treatment and discuss how the patient can incorporate goal setting into their daily lives to aide in recovery. Orientation:   The focus of this group is to educate the patient on the purpose and policies of crisis stabilization and provide a format to answer questions about their admission.  The group details unit policies and expectations of patients while admitted.    Participation Level:  Did Not Attend   Logan LITTIE Molly 09/10/2023, 6:33 PM

## 2023-09-10 NOTE — Plan of Care (Signed)
   Problem: Education: Goal: Emotional status will improve Outcome: Not Progressing Goal: Mental status will improve Outcome: Not Progressing

## 2023-09-10 NOTE — Progress Notes (Signed)
(  Sleep Hours) -7.25  (Any PRNs that were needed, meds refused, or side effects to meds)- Vistaril  25mg   (Any disturbances and when (visitation, over night)-N/A  (Concerns raised by the patient)- want to D/C  (SI/HI/AVH)-denies .

## 2023-09-10 NOTE — Group Note (Signed)
 Recreation Therapy Group Note   Group Topic:Relaxation  Group Date: 09/10/2023 Start Time: 1000 End Time: 1040 Facilitators: Armetta Henri-McCall, LRT,CTRS Location: 500 Hall Dayroom   Group Topic: Relaxation  Goal Area(s) Addresses:  Patient will identify the benefits of music.  Patient will identify benefit of practicing music therapy post d/c.   Behavioral Response: Engaged  Intervention: Music, Surveyor, quantity  Activity: Music Therapy. LRT and patients discussed the importance of music and how it can be used. LRT and patients discussed how music can illicit different emotions as well as be used as a coping mechanism to deal with life's challenges. Patients were then given to chance to choose songs that had importance to them. Patients would then explain the significance of the song they chose.    Education: Relaxation, Discharge Planning.   Education Outcome: Acknowledges education/In group clarification offered   Affect/Mood: Appropriate   Participation Level: Engaged and Hyperverbal   Participation Quality: Independent   Behavior: Attentive  and Cooperative   Speech/Thought Process: Incoherent   Insight: Limited   Judgement: Limited   Modes of Intervention: Music   Patient Response to Interventions:  Engaged   Education Outcome:  In group clarification offered    Clinical Observations/Individualized Feedback: Pt was active during group. Pt was also attentive to the songs of peers. Pt requested the song Maybe Tomorrow by the [place] [employer]. Pt expressed choosing this song because he was around 63 yrs old, had braids in his hair and describes how he is. Pt also rambled about various women of different races he has dealt with. Pt was hard to understand so LRT had to strain to try to get what he was saying.      Plan: Continue to engage patient in RT group sessions 2-3x/week.   Bellami Farrelly-McCall, LRT,CTRS 09/10/2023 12:38 PM

## 2023-09-11 ENCOUNTER — Other Ambulatory Visit (HOSPITAL_COMMUNITY): Payer: Self-pay

## 2023-09-11 ENCOUNTER — Telehealth (HOSPITAL_COMMUNITY): Payer: Self-pay | Admitting: Pharmacy Technician

## 2023-09-11 MED ORDER — DIPHENHYDRAMINE HCL 50 MG/ML IJ SOLN: 50.0000 mg | Freq: Once | INTRAMUSCULAR | Status: DC | PRN

## 2023-09-11 MED ORDER — PALIPERIDONE PALMITATE ER 234 MG/1.5ML IM SUSY
234.0000 mg | PREFILLED_SYRINGE | Freq: Once | INTRAMUSCULAR | Status: DC
  Filled 2023-09-11: qty 1.5

## 2023-09-11 NOTE — Progress Notes (Signed)
 CSW attempted to call patient's legal guardian and group home but didn't get an answer and unable to leave messages due to mailbox being full.  Antonios Ostrow, LCSWA 09/11/23 4:29PM

## 2023-09-11 NOTE — Group Note (Signed)
 Date:  09/11/2023 Time:  8:48 PM  Group Topic/Focus:  Wrap-Up Group:   The focus of this group is to help patients review their daily goal of treatment and discuss progress on daily workbooks.    Participation Level:  Did Not Attend   Bradford Cazier Dacosta 09/11/2023, 8:48 PM

## 2023-09-11 NOTE — Telephone Encounter (Signed)
 Patient Product/process development scientist completed.    The patient is insured through [employer]. Patient has Medicare and is not eligible for a copay card, but may be able to apply for patient assistance or Medicare RX Payment Plan (Patient Must reach out to their plan, if eligible for payment plan), if available.    Ran test claim for Invega  Sustenna 156 mg/ml and the current 30 day co-pay is $0.00.   This test claim was processed through [facility]- copay amounts may vary at other pharmacies due to pharmacy/plan contracts, or as the patient moves through the different stages of their insurance plan.     Reyes Sharps, CPHT Pharmacy Technician III Certified Patient Advocate [facility] Patient Advocate Team Direct Number: [phone]  Fax: [phone]

## 2023-09-11 NOTE — Progress Notes (Signed)
 [facility] MD Progress Note  09/11/2023 2:33 PM [name]  MRN:  [id number]  Principal Problem: Schizoaffective disorder [facility]) Diagnosis: Principal Problem:   Schizoaffective disorder (HCC)   Reason for Admission:  [name] is a 63 y.o. male  with a past psychiatric history of severe schizoaffective disorder, bipolar type. Was hospitalized at [facility] for two months in 2024, lives in [facility] group home, and is under guardianship [name] [employer]). Patient initially arrived to [facility] on 8/30 from [facility] for delusional thinking and aggressive behavior. Was admitted to the gero-psych unit at [facility] 8/30 where these behaviors continued: hollering in the hallway, entering other patient's rooms, verbally threatening security staff, attempting to punch an RN. Was then Transferred to [facility] under IVC on 09/09/2023 to the 500-level unit as his behaviors demanded a higher level of care. PMHx is significant for hypertension.  (admitted on 09/08/2023, total  LOS: 3 days )   Overnight: Slept 7.25 hours. No acute events. Hydroxyzine  25 mg x1. Denies SI, HI and AVH. Patient has been calling various elements of his outpatient care team constantly.   On interview: Patient able to maintain good conversation, however is perseverative on discharge and wants this provider to call various people on his team. Flagged down this provider multiple times throughout the day. Assured patient that I would reach out to his guardian today. Made it clear that to return to his group home he would need to show several days of behavioral regulation, and that limiting phone calls to 3 or 4 per number may help with this. He is amenable to this. Continued poor insight into why he presented to [facility] (I'm here because I chose to come here (from the [facility] gero unit).) Also very willing to receive Invega  Sustenna injection. Denied suicidal and homicidal ideation. Denied auditory and visual hallucinations. No somatic  symptoms.   On collateral call with [name], guardian with [employer] [phone], ext. [phone]): HIPAA compliant voicemail left, was unable to reach. Left call-back number.   Past Psychiatric History: Current psychiatrist: Dr. Emile Dover from [facility], visits  Current therapist: None Previous psychiatric diagnoses: Schizoaffective disorder, bipolar type.  Current psychiatric medications: tegretol  200 BID, thorazine  200 mg at bedtime, klonopin  1 mg BID, prolixin  5 mg BID, Trazodone  100 mg at bedtime  Psychiatric medication history/compliance: Patient reports compliance recently, per chart review unclear if he was reciving his medications.  Psychiatric hospitalization(s): a few times most recently at [facility] 08/10/2022-10/02/2022 Psychotherapy history: Unknown Neuromodulation history: Unknown History of suicide (obtained from HPI): Denies presently, none noted in recent chart review History of homicide or aggression (obtained in HPI): Patient has a history of violence against staff, has recently been throwing furniture, trying to punch staff, banging on windows.    Substance Abuse History:   None, as above.   Past Medical History:   HTN, per chart. Otherwise, patient denies.    Social History: Living situation: Group home as above Education: Some college Occupational history: Unknown Marital status: Single. Children: Unknown Legal: None presently Military: None   Access to firearms: None, lives at group home.    Family Psychiatric History:   Patient denies   Family Medical History:   None pertinent Past Medical History:  Past Medical History:  Diagnosis Date   Hypertension    Schizoaffective disorder, bipolar type (HCC)    Schizophrenia (HCC)    Family History: History reviewed. No pertinent family history.  Current Medications: Current Facility-Administered  Medications  Medication Dose Route Frequency Provider Last Rate Last Admin    acetaminophen  (TYLENOL ) tablet 650 mg  650 mg Oral Q6H PRN Donnelly Mellow, MD       alum & mag hydroxide-simeth (MAALOX/MYLANTA) 200-200-20 MG/5ML suspension 30 mL  30 mL Oral Q4H PRN Jadapalle, Sree, MD       amLODipine  (NORVASC ) tablet 10 mg  10 mg Oral Daily Madaram, Kondal R, MD   10 mg at 09/11/23 0853   atorvastatin  (LIPITOR) tablet 40 mg  40 mg Oral QHS Madaram, Kondal R, MD   40 mg at 09/10/23 2044   carbamazepine  (TEGRETOL  XR) 12 hr tablet 200 mg  200 mg Oral BID Madaram, Kondal R, MD   200 mg at 09/11/23 0816   chlorproMAZINE  (THORAZINE ) tablet 200 mg  200 mg Oral QHS Madaram, Kondal R, MD   200 mg at 09/10/23 2044   clonazePAM  (KLONOPIN ) tablet 1 mg  1 mg Oral TID Rollene Katz, MD   1 mg at 09/11/23 1235   diphenhydrAMINE  (BENADRYL ) capsule 50 mg  50 mg Oral [id number] Madaram, Kondal R, MD   50 mg at 09/11/23 0816   diphenhydrAMINE  (BENADRYL ) injection 50 mg  50 mg Intramuscular Once PRN Rollene Katz, MD       hydrOXYzine  (ATARAX ) tablet 25 mg  25 mg Oral TID PRN Jadapalle, Sree, MD   25 mg at 09/10/23 2044   losartan  (COZAAR ) tablet 100 mg  100 mg Oral Daily Madaram, Kondal R, MD   100 mg at 09/11/23 9146   magnesium  hydroxide (MILK OF MAGNESIA) suspension 30 mL  30 mL Oral Daily PRN Donnelly Mellow, MD       metoprolol  tartrate (LOPRESSOR ) tablet 25 mg  25 mg Oral BID Madaram, Kondal R, MD   25 mg at 09/11/23 9146   nicotine  polacrilex (NICORETTE ) gum 2 mg  2 mg Oral Q4H PRN Rollene Katz, MD       OLANZapine  (ZYPREXA ) injection 10 mg  10 mg Intramuscular TID PRN Jadapalle, Sree, MD   10 mg at 09/09/23 1528   OLANZapine  (ZYPREXA ) injection 5 mg  5 mg Intramuscular TID PRN Jadapalle, Sree, MD       OLANZapine  zydis (ZYPREXA ) disintegrating tablet 5 mg  5 mg Oral TID PRN Jadapalle, Sree, MD   5 mg at 09/09/23 9068   paliperidone  (INVEGA  SUSTENNA) injection 234 mg  234 mg Intramuscular Once Rollene Katz, MD       risperiDONE  (RISPERDAL ) tablet 2 mg  2 mg Oral BID  Rollene Katz, MD   2 mg at 09/11/23 9183   traZODone  (DESYREL ) tablet 100 mg  100 mg Oral QHS Madaram, Kondal R, MD   100 mg at 09/10/23 2044   traZODone  (DESYREL ) tablet 50 mg  50 mg Oral QHS PRN Jadapalle, Sree, MD        Lab Results:  Results for orders placed or performed during the hospital encounter of 09/08/23 (from the past 48 hours)  TSH     Status: None   Collection Time: 09/10/23  6:19 AM  Result Value Ref Range   TSH 0.551 0.350 - 4.500 uIU/mL    Comment: Performed at [facility], [address], [place], [place] [address]  Hemoglobin A1c     Status: Abnormal   Collection Time: 09/10/23  6:19 AM  Result Value Ref Range   Hgb A1c MFr Bld 4.7 (L) 4.8 - 5.6 %    Comment: (NOTE) Diagnosis of Diabetes The following HbA1c ranges  recommended by the American Diabetes Association (ADA) may be used as an aid in the diagnosis of diabetes mellitus.  Hemoglobin             Suggested A1C NGSP%              Diagnosis  <5.7                   Non Diabetic  5.7-6.4                Pre-Diabetic  >6.4                   Diabetic  <7.0                   Glycemic control for                       adults with diabetes.     Mean Plasma Glucose 88.19 mg/dL    Comment: Performed at [facility], [address] [place], [place] [address]  Lipid panel     Status: None   Collection Time: 09/10/23  6:19 AM  Result Value Ref Range   Cholesterol 156 0 - 200 mg/dL    Comment:        ATP III CLASSIFICATION:  <200     mg/dL   Desirable  799-760  mg/dL   Borderline High  >=759    mg/dL   High           Triglycerides 79 <150 mg/dL   HDL 49 >59 mg/dL   Total CHOL/HDL Ratio 3.2 RATIO   VLDL 16 0 - 40 mg/dL   LDL Cholesterol 91 0 - 99 mg/dL    Comment:        Total Cholesterol/HDL:CHD Risk Coronary Heart Disease Risk Table                     Men   Women  1/2 Average Risk   3.4   3.3  Average Risk       5.0   4.4  2 X Average Risk   9.6   7.1  3 X  Average Risk  23.4   11.0        Use the calculated Patient Ratio above and the CHD Risk Table to determine the patient's CHD Risk.        ATP III CLASSIFICATION (LDL):  <100     mg/dL   Optimal  899-870  mg/dL   Near or Above                    Optimal  130-159  mg/dL   Borderline  839-810  mg/dL   High  >809     mg/dL   Very High Performed at [facility], [address], [place], [place] [address]     Blood Alcohol level:  Lab Results  Component Value Date   [facility] <15 09/04/2023   ETH <10 08/06/2022    Metabolic Labs: Lab Results  Component Value Date   HGBA1C 4.7 (L) 09/10/2023   MPG 88.19 09/10/2023   MPG 103 08/16/2022   No results found for: PROLACTIN Lab Results  Component Value Date   CHOL 156 09/10/2023   TRIG 79 09/10/2023   HDL 49 09/10/2023   CHOLHDL 3.2 09/10/2023   VLDL 16 09/10/2023   LDLCALC 91 09/10/2023   LDLCALC 84 08/16/2022  Physical Findings: AIMS: No  CIWA:    COWS:     Psychiatric Specialty Exam:  Presentation  General Appearance: Casual  Eye Contact:Fair  Speech:Clear and Coherent (somewhat garbled d/t poor dentition)  Speech Volume:Normal  Handedness:Right   Mood and Affect  Mood:Anxious  Affect:Congruent   Thought Process  Thought Processes:Goal Directed; Irrevelant  Descriptions of Associations:Intact  Orientation:Full (Time, Place and Person)  Thought Content:Scattered; Perseveration  History of Schizophrenia/Schizoaffective disorder:Yes  Duration of Psychotic Symptoms:No data recorded Hallucinations:Hallucinations: None  Ideas of Reference:None  Suicidal Thoughts:Suicidal Thoughts: No  Homicidal Thoughts:Homicidal Thoughts: No   Sensorium  Memory:Immediate Fair  Judgment:Fair  Insight:Poor   Executive Functions  Concentration:Fair  Attention Span:Fair  Recall:Fair  Fund of Knowledge:Fair  Language:Fair   Psychomotor Activity  Psychomotor Activity:No data  recorded  Assets  Assets:Communication Skills; Desire for Improvement   Sleep  Sleep:Sleep: Good Number of Hours of Sleep: 7.75    Physical Exam: Physical Exam ROS Blood pressure 114/71, pulse 74, temperature 98 F (36.7 C), temperature source Oral, resp. rate 18, height 5' 7 (1.702 m), weight 64.8 kg, SpO2 98%. Body mass index is 22.37 kg/m.  Treatment Plan Summary: Daily contact with patient to assess and evaluate symptoms and progress in treatment and Medication management     ASSESSMENT:    Diagnoses / Active Problems: Schizoaffective disorder, bipolar type, in current episode.  [name] is a 63 y.o. male  with a past psychiatric history of severe schizoaffective disorder, bipolar type. Was hospitalized at [facility] for two months in 2024, lives in [facility] group home, and is under guardianship [name] [employer]). Patient initially arrived to [facility] on 8/30 from [facility] for delusional thinking and aggressive behavior. Was admitted to the gero-psych unit at [facility] 8/30 where these behaviors continued: hollering in the hallway, entering other patient's rooms, verbally threatening security staff, attempting to punch an RN. Was then Transferred to [facility] under IVC on 09/09/2023 to the 500-level unit as his behaviors demanded a higher level of care. PMHx is significant for hypertension.   Again sleeping better. Tolerating Risperdal  2 mg this AM very well. Amenable to Invega  Sustenna shot, will give 234 mg IM today with IM benadryl  50 mg PRN for acute dystonia and continue current regimen otherwise. Appears less intrusive on exam. Still very perseverative on discharge but is able to modulate his desire to leave in order to hold a conversation. Attempted to reach guardian again, was unable. It appears he may need  a cab ride to his group home in [place]. If he is able to continue to improve over the weekend with decreasing intrusiveness/hypersexual behaviors can  consider discharge Monday.   PLAN:   Safety and Monitoring: -  VOLUNTARY with guardian consent, admission to inpatient psychiatric unit for safety, stabilization and treatment. - Daily contact with patient to assess and evaluate symptoms and progress in treatment - Patient's case to be discussed in multi-disciplinary team meeting -  Observation Level : q15 minute checks -  Vital signs:  q12 hours -  Precautions: suicide, elopement, and assault   2. Psychiatric Diagnoses and Treatment:     # Schizoaffective disorder, bipolar type - Continue home thorazine  200 mg at bedtime for psychosis. - Continue Risperdal  2 mg BID to target manic symptomatology - Give Invega  Sustenna 234 mg IM x1 today 9/5, can follow-up in a few days if tolerated.  - IM Benadryl  50 mg once PRN for acute dystonia ordered.  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 - PRNs: Olanzapine  zydis 5 mg TID PRN for mild agitation, IM Olanzapine  5 mg TID PRN for moderate agitation, and IM Olanzapine  10 mg TID PRN for severe agitation. Consider alternative PRNs  - Continue Benadryl  50 mg BID - The risks/benefits/side-effects/alternatives to this medication were discussed in detail with the patient and time was given for questions. The patient consents to medication trial.  - Metabolic profile and EKG monitoring obtained while on an atypical antipsychotic  BMI: 22 TSH: WNL Lipid panel: WNL HbgA1c: 4.7% QTc: 442  - Encouraged patient to participate in unit milieu and in scheduled group therapies  - Short Term Goals: Ability to verbalize feelings will improve, Ability to demonstrate self-control will improve, and Ability to identify and develop effective coping behaviors will improve - Long Term Goals: Improvement in symptoms so as ready for discharge   Other PRNS: agitation as above, anxiety, sleep, constipation    Other labs reviewed on  admission:                3. Medical Issues Being Addressed:    # Tobacco Use Disorder  - Nicotine  gum ordered - Smoking cessation encouraged.   4. Discharge Planning:    - Estimated discharge date: 3-4 days  - Social work and case management to assist with discharge planning and identification of hospital follow-up needs prior to discharge. - Discharge concerns: Need to establish a safety plan; medication compliance and effectiveness. - Discharge goals: Return home with outpatient referrals for mental health follow-up including medication management/psychotherapy.   I certify that inpatient services furnished can reasonably be expected to improve the patient's condition.     NB: This note was created using a voice recognition software as a result there may be grammatical errors inadvertently enclosed that do not reflect the nature of this encounter. Every attempt is made to correct such errors.   Odis Cleveland, MD PGY-2, Psychiatry Residency  9/5/20252:33 PM

## 2023-09-11 NOTE — Progress Notes (Signed)
   09/11/23 1050  Psych Admission Type (Psych Patients Only)  Admission Status Voluntary  Psychosocial Assessment  Patient Complaints Anxiety;Irritability  Eye Contact Fair  Facial Expression Anxious  Affect Anxious;Irritable  Speech Argumentative  Interaction Attention-seeking  Motor Activity Fidgety;Restless  Appearance/Hygiene In scrubs  Behavior Characteristics Anxious;Irritable  Mood Labile  Aggressive Behavior  Effect No apparent injury  Thought Process  Coherency Disorganized  Content Preoccupation  Delusions Paranoid  Perception WDL  Hallucination None reported or observed  Judgment Impaired  Confusion Mild  Danger to Self  Current suicidal ideation? Denies  Danger to Others  Danger to Others None reported or observed

## 2023-09-11 NOTE — Progress Notes (Signed)
(  Sleep Hours) -6  (Any PRNs that were needed, meds refused, or side effects to meds)- Trazodone  50 , Vistaril  25  (Any disturbances and when (visitation, over night)-N/A  (Concerns raised by the patient)- Pt stated he does not like the Invega  shot, but I will take it If he has nothing else or  does not  have the risperdal  shot  (SI/HI/AVH)-denies

## 2023-09-11 NOTE — [facility] Group Notes (Signed)
 Spirituality Group   Description: Participant directed exploration of values, beliefs and meaning   Following a brief framework of chaplain's role and ground rules of group behavior, participants are invited to share concerns or questions that engage spiritual life. Emphasis placed on common themes and shared experiences and ways to make meaning and clarify living into one's values.   Theory/Process/Goal: Utilize the theoretical framework of group therapy established by Celena Kite, Relational Cultural Theory and Rogerian approaches to facilitate relational empathy and use of the "here and now" to foster reflection, self-awareness, and sharing.   Observations: [name] participated in the first half of group. He shared finding meaning in prayer to [place]. [place]. Affect was positive and engaged in the group appropriately.  Maverik Foot L. Delores HERO.Div

## 2023-09-11 NOTE — Plan of Care (Signed)
  Problem: Activity: Goal: Interest or engagement in activities will improve Outcome: Progressing Goal: Sleeping patterns will improve Outcome: Progressing   Problem: Coping: Goal: Ability to verbalize frustrations and anger appropriately will improve Outcome: Progressing Goal: Ability to demonstrate self-control will improve Outcome: Progressing   Problem: Safety: Goal: Periods of time without injury will increase Outcome: Progressing

## 2023-09-11 NOTE — Group Note (Signed)
 Recreation Therapy Group Note   Group Topic:Coping Skills  Group Date: 09/11/2023 Start Time: 1015 End Time: 1045 Facilitators: Kynzlee Hucker-McCall, LRT,CTRS Location: 500 Hall Dayroom   Group Topic: Coping Skills    Goal Area(s) Addresses: Patient will define what a coping skill is. Patient will create a list of healthy coping skills beginning with each letter of the alphabet. Patient will successfully identify positive coping skills they can use post d/c.  Patient will acknowledge benefit(s) of using learned coping skills post d/c.   Behavioral Response: Engaged   Intervention: Worksheet   Activity: Coping A to Z. Patient asked to identify what a coping skill is and when they use them. Patients with Clinical research associate discussed healthy versus unhealthy coping skills. Next patients were given a blank worksheet titled Coping Skills A-Z. Patients were instructed to come up with at least one positive coping skill per letter of the alphabet.Patients were given 15 minutes to brainstorm before ideas were presented to the large group. Patients and LRT debriefed on the importance of coping skill selection based on situation and back-up plans when a skill tried is not effective. At the end of group, patients were given an handout of alphabetized strategies to keep for future reference.   Education: Pharmacologist, Scientist, physiological, Discharge Planning.    Education Outcome: Acknowledges education/Verbalizes understanding/In group clarification offered/Additional education needed   Affect/Mood: Appropriate   Participation Level: Engaged   Participation Quality: Independent   Behavior: Appropriate   Speech/Thought Process: Focused   Insight: Poor   Judgement: Poor   Modes of Intervention: Worksheet   Patient Response to Interventions:  Engaged   Education Outcome:  In group clarification offered    Clinical Observations/Individualized Feedback: Pt was late to group but joined in with  the activity. Pt listed things that weren't coping skills but would explain the significance of them. Some of the things pt identified were asthma,EpiPen, diaphragm (release/inhale air), esophagus ( when you are choking, you can get the hylic maneuver), feet (improve hygiene), gas/bowels (use the bathroom), hazard (stay alert), and insulin  (for your blood). Pt was appropriate and engaged during group session.       Plan: Continue to engage patient in RT group sessions 2-3x/week.   Morty Ortwein-McCall, LRT,CTRS 09/11/2023 1:18 PM

## 2023-09-11 NOTE — Plan of Care (Signed)
   Problem: Education: Goal: Emotional status will improve Outcome: Progressing Goal: Mental status will improve Outcome: Progressing   Problem: Activity: Goal: Interest or engagement in activities will improve Outcome: Progressing Goal: Sleeping patterns will improve Outcome: Progressing   Problem: Safety: Goal: Periods of time without injury will increase Outcome: Progressing

## 2023-09-12 MED ORDER — PALIPERIDONE PALMITATE ER 234 MG/1.5ML IM SUSY
234.0000 mg | PREFILLED_SYRINGE | Freq: Once | INTRAMUSCULAR | Status: AC
  Administered 2023-09-12: 234 mg via INTRAMUSCULAR

## 2023-09-12 MED ORDER — PALIPERIDONE PALMITATE ER 156 MG/ML IM SUSY
156.0000 mg | PREFILLED_SYRINGE | Freq: Once | INTRAMUSCULAR | Status: AC
  Administered 2023-09-15: 156 mg via INTRAMUSCULAR

## 2023-09-12 MED ORDER — RISPERIDONE 3 MG PO TABS
3.0000 mg | ORAL_TABLET | Freq: Two times a day (BID) | ORAL | Status: AC
  Administered 2023-09-12 – 2023-09-15 (×7): 3 mg via ORAL
  Filled 2023-09-12 (×7): qty 1

## 2023-09-12 MED ORDER — PALIPERIDONE PALMITATE ER 234 MG/1.5ML IM SUSY: 234.0000 mg | PREFILLED_SYRINGE | INTRAMUSCULAR | Status: DC

## 2023-09-12 NOTE — [facility] Group Notes (Signed)
 Adult Psychoeducational Group Note  Date:  09/12/2023 Time:  7:38 PM  Group Topic/Focus:  Goals Group:   The focus of this group is to help patients establish daily goals to achieve during treatment and discuss how the patient can incorporate goal setting into their daily lives to aide in recovery. Orientation:   The focus of this group is to educate the patient on the purpose and policies of crisis stabilization and provide a format to answer questions about their admission.  The group details unit policies and expectations of patients while admitted.  Participation Level:  Active  Participation Quality:  Appropriate  Affect:  Appropriate  Cognitive:  Appropriate  Insight: Appropriate  Engagement in Group:  Engaged  Modes of Intervention:  Discussion  Additional Comments:  Pt attended the goals group and remained appropriate and engaged throughout the duration of the group.   Trevor Duty O 09/12/2023, 7:38 PM

## 2023-09-12 NOTE — Progress Notes (Signed)
   09/12/23 2100  Psych Admission Type (Psych Patients Only)  Admission Status Voluntary  Psychosocial Assessment  Patient Complaints Worrying  Eye Contact Fair  Facial Expression Animated  Affect Appropriate to circumstance  Speech Logical/coherent  Interaction Assertive  Motor Activity Slow  Appearance/Hygiene Unremarkable  Behavior Characteristics Cooperative;Appropriate to situation  Mood Pleasant  Thought Process  Coherency Disorganized  Content Preoccupation  Delusions None reported or observed  Perception WDL  Hallucination None reported or observed  Judgment Impaired  Confusion Mild  Danger to Self  Current suicidal ideation? Denies  Danger to Others  Danger to Others None reported or observed  Danger to Others Abnormal  Harmful Behavior to others No threats or harm toward other people  Description of Harmful Behavior none  Destructive Behavior No threats or harm toward property

## 2023-09-12 NOTE — Progress Notes (Signed)
 [facility] MD Progress Note  09/12/2023 3:04 PM [name]  MRN:  [id number]  Principal Problem: Schizoaffective disorder [name]) Diagnosis: Principal Problem:   Schizoaffective disorder (HCC)   Reason for Admission:  [name] is a 63 y.o. male  with a past psychiatric history of severe schizoaffective disorder, bipolar type. Was hospitalized at [facility] for two months in 2024, lives in [name] group home, and is under guardianship [name] lives). Patient initially arrived to [facility] on 8/30 from [facility] for delusional thinking and aggressive behavior. Was admitted to the gero-psych unit at [facility] 8/30 where these behaviors continued: hollering in the hallway, entering other patient's rooms, verbally threatening security staff, attempting to punch an RN. Was then Transferred to [facility] under IVC on 09/09/2023 to the 500-level unit as his behaviors demanded a higher level of care. PMHx is significant for hypertension.  (admitted on 09/08/2023, total  LOS: 4 days )   Overnight: Denied LAI last night.  Intermittently sexually inappropriate per nursing but otherwise has been doing fine.   On interview:  Says he is okay with getting the injection this morning.  Says that he wants to leave next week. Pt denies extrapyramidal symptoms including dystonia (sudden spastic contractions of muscle groups), parkinsonism (bradykinesia, tremors, rigidity), and akathisia (severe restlessness).  Reports he is sleeping okay.  Talks about irrelevant topics during interview.      Past Psychiatric History: Current psychiatrist: Dr. Emile Dover from [facility], visits  Current therapist: None Previous psychiatric diagnoses: Schizoaffective disorder, bipolar type.  Current psychiatric medications: tegretol  200 BID, thorazine  200 mg at bedtime, klonopin  1 mg BID, prolixin  5 mg BID, Trazodone  100 mg at bedtime  Psychiatric medication history/compliance: Patient reports compliance  recently, per chart review unclear if he was reciving his medications.  Psychiatric hospitalization(s): a few times most recently at [facility] 08/10/2022-10/02/2022 Psychotherapy history: Unknown Neuromodulation history: Unknown History of suicide (obtained from HPI): Denies presently, none noted in recent chart review History of homicide or aggression (obtained in HPI): Patient has a history of violence against staff, has recently been throwing furniture, trying to punch staff, banging on windows.    Substance Abuse History:   None, as above.   Past Medical History:   HTN, per chart. Otherwise, patient denies.    Social History: Living situation: Group home as above Education: Some college Occupational history: Unknown Marital status: Single. Children: Unknown Legal: None presently Military: None   Access to firearms: None, lives at group home.    Family Psychiatric History:   Patient denies   Family Medical History:   None pertinent Past Medical History:  Past Medical History:  Diagnosis Date   Hypertension    Schizoaffective disorder, bipolar type (HCC)    Schizophrenia (HCC)    Family History: History reviewed. No pertinent family history.  Current Medications: Current Facility-Administered Medications  Medication Dose Route Frequency Provider Last Rate Last Admin   acetaminophen  (TYLENOL ) tablet 650 mg  650 mg Oral Q6H PRN Donnelly Mellow, MD       alum & mag hydroxide-simeth (MAALOX/MYLANTA) 200-200-20 MG/5ML suspension 30 mL  30 mL Oral Q4H PRN Jadapalle, Sree, MD       amLODipine  (NORVASC ) tablet 10 mg  10 mg Oral Daily Madaram, Kondal R, MD   10 mg at 09/12/23 0835   atorvastatin  (LIPITOR) tablet 40 mg  40 mg Oral QHS Madaram, Kondal R, MD   40 mg at 09/11/23 2035   carbamazepine  (TEGRETOL  XR) 12  hr tablet 200 mg  200 mg Oral BID Madaram, Kondal R, MD   200 mg at 09/12/23 9164   chlorproMAZINE  (THORAZINE ) tablet 200 mg  200 mg Oral QHS Madaram, Kondal R, MD   200  mg at 09/11/23 2034   clonazePAM  (KLONOPIN ) tablet 1 mg  1 mg Oral TID Rollene Katz, MD   1 mg at 09/12/23 9164   diphenhydrAMINE  (BENADRYL ) capsule 50 mg  50 mg Oral [id number] Madaram, Kondal R, MD   50 mg at 09/12/23 9392   diphenhydrAMINE  (BENADRYL ) injection 50 mg  50 mg Intramuscular Once PRN Rollene Katz, MD       hydrOXYzine  (ATARAX ) tablet 25 mg  25 mg Oral TID PRN Donnelly Mellow, MD   25 mg at 09/11/23 2035   losartan  (COZAAR ) tablet 100 mg  100 mg Oral Daily Madaram, Kondal R, MD   100 mg at 09/12/23 9164   magnesium  hydroxide (MILK OF MAGNESIA) suspension 30 mL  30 mL Oral Daily PRN Donnelly Mellow, MD       metoprolol  tartrate (LOPRESSOR ) tablet 25 mg  25 mg Oral BID Madaram, Kondal R, MD   25 mg at 09/12/23 9164   nicotine  polacrilex (NICORETTE ) gum 2 mg  2 mg Oral Q4H PRN Rollene Katz, MD       OLANZapine  (ZYPREXA ) injection 10 mg  10 mg Intramuscular TID PRN Donnelly Mellow, MD   10 mg at 09/09/23 1528   OLANZapine  (ZYPREXA ) injection 5 mg  5 mg Intramuscular TID PRN Donnelly Mellow, MD       OLANZapine  zydis (ZYPREXA ) disintegrating tablet 5 mg  5 mg Oral TID PRN Jadapalle, Sree, MD   5 mg at 09/09/23 9068   paliperidone  (INVEGA  SUSTENNA) injection 234 mg  234 mg Intramuscular Once Charna Neeb, MD       risperiDONE  (RISPERDAL ) tablet 3 mg  3 mg Oral BID Tiffaney Heimann, MD       traZODone  (DESYREL ) tablet 100 mg  100 mg Oral QHS Madaram, Kondal R, MD   100 mg at 09/11/23 2035   traZODone  (DESYREL ) tablet 50 mg  50 mg Oral QHS PRN Jadapalle, Sree, MD   50 mg at 09/11/23 2100     Blood Alcohol level:  Lab Results  Component Value Date   [facility] <15 09/04/2023   ETH <10 08/06/2022    Metabolic Labs: Lab Results  Component Value Date   HGBA1C 4.7 (L) 09/10/2023   MPG 88.19 09/10/2023   MPG 103 08/16/2022   No results found for: PROLACTIN Lab Results  Component Value Date   CHOL 156 09/10/2023   TRIG 79 09/10/2023   HDL 49 09/10/2023   CHOLHDL 3.2  09/10/2023   VLDL 16 09/10/2023   LDLCALC 91 09/10/2023   LDLCALC 84 08/16/2022    Physical Findings: AIMS: AIMS: Facial and Oral Movements Muscles of Facial Expression: None Lips and Perioral Area: None Jaw: None Tongue: None,Extremity Movements Upper (arms, wrists, hands, fingers): None Lower (legs, knees, ankles, toes): None, Trunk Movements Neck, shoulders, hips: None, Global Judgements Severity of abnormal movements overall : None Incapacitation due to abnormal movements: None Patient's awareness of abnormal movements: No Awareness, Dental Status Current problems with teeth and/or dentures?: No Does patient usually wear dentures?: No Edentia?: Yes  Total score: 0  Tremors:No Cogwheeling:No Rigidity:No    Psychiatric Specialty Exam:  Presentation  General Appearance: Casual  Eye Contact:Fair  Speech:Clear and Coherent (somewhat garbled d/t poor dentition)  Speech Volume:Normal  Handedness:Right   Mood and  Affect  Mood:Anxious  Affect:Congruent   Thought Process  Thought Processes:Goal Directed; Irrevelant  Descriptions of Associations:Intact  Orientation:Full (Time, Place and Person)  Thought Content:Scattered; Perseveration  History of Schizophrenia/Schizoaffective disorder:Yes  Duration of Psychotic Symptoms: chronic Hallucinations:Hallucinations: None  Ideas of Reference:None  Suicidal Thoughts:Suicidal Thoughts: No  Homicidal Thoughts:Homicidal Thoughts: No   Sensorium  Memory:Immediate Fair  Judgment:Fair  Insight:Poor   Executive Functions  Concentration:Fair  Attention Span:Fair  Recall:Fair  Fund of Knowledge:Fair  Language:Fair   Psychomotor Activity  Psychomotor Activity: normal, no eps  Assets  Assets:Communication Skills; Desire for Improvement   Sleep  Sleep:Sleep: Good Number of Hours of Sleep: 7.75    Physical Exam: Physical Exam Vitals and nursing note reviewed.  HENT:     Head:  Normocephalic and atraumatic.  Pulmonary:     Effort: Pulmonary effort is normal.  Neurological:     General: No focal deficit present.     Mental Status: He is alert.     Comments: No pain, stiffness, cogwheeling on passive ROM of bilateral upper extremities   Psychiatric:     Comments: No obvious EPS.    Review of Systems  Constitutional:  Negative for fever.  Cardiovascular:  Negative for chest pain and palpitations.  Gastrointestinal:  Negative for constipation, diarrhea, nausea and vomiting.  Neurological:  Negative for dizziness, weakness and headaches.  Psychiatric/Behavioral:         Pt denies extrapyramidal symptoms including dystonia (sudden spastic contractions of muscle groups), parkinsonism (bradykinesia, tremors, rigidity), and akathisia (severe restlessness).    Blood pressure 125/65, pulse 73, temperature 97.8 F (36.6 C), temperature source Oral, resp. rate 18, height 5' 7 (1.702 m), weight 64.8 kg, SpO2 97%. Body mass index is 22.37 kg/m.  Treatment Plan Summary: Daily contact with patient to assess and evaluate symptoms and progress in treatment and Medication management     ASSESSMENT:    Diagnoses / Active Problems: Schizoaffective disorder, bipolar type, in current episode.  [name] is a 63 y.o. male  with a past psychiatric history of severe schizoaffective disorder, bipolar type. Was hospitalized at [facility] for two months in 2024, lives in [facility] group home, and is under guardianship [name] lives). Patient initially arrived to [facility] on 8/30 from [facility] for delusional thinking and aggressive behavior. Was admitted to the gero-psych unit at [facility] 8/30 where these behaviors continued: hollering in the hallway, entering other patient's rooms, verbally threatening security staff, attempting to punch an RN. Was then Transferred to [facility] under IVC on 09/09/2023 to the 500-level unit as his behaviors demanded a higher level of  care. PMHx is significant for hypertension.   Received Invega  injection this morning without complication.  Next dose will be 156 mg on 9/9.  After that he will do 234 mg once monthly.  Plan for transfer back to group home on 9/9 or 9/10 after second Invega  loading dose.  Continuing to display some inappropriate behaviors on unit so we will increase Risperdal  from 2 to 3 mg.  PLAN:   Safety and Monitoring: -  VOLUNTARY with guardian consent, admission to inpatient psychiatric unit for safety, stabilization and treatment. - Daily contact with patient to assess and evaluate symptoms and progress in treatment - Patient's case to be discussed in multi-disciplinary team meeting -  Observation Level : q15 minute checks -  Vital signs:  q12 hours -  Precautions: suicide, elopement, and assault   2. Psychiatric Diagnoses and Treatment:     #  Schizoaffective disorder, bipolar type - Continue home thorazine  200 mg at bedtime for psychosis. - Increase Risperdal  from 2 to 3 mg BID to target manic symptomatology - Received Invega  Sustenna 234 mg IM 9/6 AM, nex dose 156 mg LAI 9/9 AM, maintenance dosing moving forward will be 234 mg once monthly  - IM Benadryl  50 mg once PRN for acute dystonia ordered.  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 - PRNs: Olanzapine  zydis 5 mg TID PRN for mild agitation, IM Olanzapine  5 mg TID PRN for moderate agitation, and IM Olanzapine  10 mg TID PRN for severe agitation. Consider alternative PRNs  - Continue Benadryl  50 mg BID - The risks/benefits/side-effects/alternatives to this medication were discussed in detail with the patient and time was given for questions. The patient consents to medication trial.  - Metabolic profile and EKG monitoring obtained while on an atypical antipsychotic  BMI: 22 TSH: WNL Lipid panel: WNL HbgA1c: 4.7% QTc: 442  - Encouraged patient to participate in  unit milieu and in scheduled group therapies  - Short Term Goals: Ability to verbalize feelings will improve, Ability to demonstrate self-control will improve, and Ability to identify and develop effective coping behaviors will improve - Long Term Goals: Improvement in symptoms so as ready for discharge   Other PRNS: agitation as above, anxiety, sleep, constipation    Other labs reviewed on admission:                3. Medical Issues Being Addressed:    # Tobacco Use Disorder  - Nicotine  gum ordered - Smoking cessation encouraged.   4. Discharge Planning:    - Estimated discharge date: 9/10  - Social work and case management to assist with discharge planning and identification of hospital follow-up needs prior to discharge. - Discharge concerns: Need to establish a safety plan; medication compliance and effectiveness. - Discharge goals: Return home with outpatient referrals for mental health follow-up including medication management/psychotherapy.   I certify that inpatient services furnished can reasonably be expected to improve the patient's condition.     NB: This note was created using a voice recognition software as a result there may be grammatical errors inadvertently enclosed that do not reflect the nature of this encounter. Every attempt is made to correct such errors.   Justino Cornish, MD PGY-2 Psychiatry Resident 09/12/2023, 3:04 PM

## 2023-09-12 NOTE — Plan of Care (Signed)
   Problem: Education: Goal: Knowledge of [facility] Education information/materials will improve Outcome: Progressing   Problem: Activity: Goal: Interest or engagement in activities will improve Outcome: Progressing   Problem: Coping: Goal: Ability to verbalize frustrations and anger appropriately will improve Outcome: Progressing   Problem: Safety: Goal: Periods of time without injury will increase Outcome: Progressing

## 2023-09-12 NOTE — Plan of Care (Addendum)
 Pt noted in dayroom majority of this shift for scheduled groups. Pt is animated, forgetful, mild confusion, interactive and intrusive with staff and peers at times. However, pt is redirectable. Denies SI, HI, AVH and pain. However, pt was noted by writer talking to chair / unseen others while in dayroom. Compliant with medications including Invega  Sustena 234 mg when offered; denies adverse drug reactions. Tolerates meals and fluids well. Continued support, encouragement and reassurance offered. Safety checks maintained at Q 15 minutes intervals without outburst this evening.  Problem: Health Behavior/Discharge Planning: Goal: Compliance with treatment plan for underlying cause of condition will improve Outcome: Progressing   Problem: Safety: Goal: Periods of time without injury will increase Outcome: Progressing

## 2023-09-12 NOTE — Group Note (Signed)
 Date:  09/12/2023 Time:  8:45 PM  Group Topic/Focus:  Wrap-Up Group:   The focus of this group is to help patients review their daily goal of treatment and discuss progress on daily workbooks.    Participation Level:  Active  Participation Quality:  Appropriate  Affect:  Appropriate  Cognitive:  Appropriate  Insight: Appropriate  Engagement in Group:  Engaged  Modes of Intervention:  Education and Exploration  Additional Comments:  Patient attended and participated in group tonight. He reports that the thing he like about himself is that he likes white women  [name] 09/12/2023, 8:45 PM

## 2023-09-13 NOTE — [facility] Group Notes (Signed)
 Adult Psychoeducational Group Note  Date:  09/13/2023 Time:  7:41 PM  Group Topic/Focus:  Goals Group:   The focus of this group is to help patients establish daily goals to achieve during treatment and discuss how the patient can incorporate goal setting into their daily lives to aide in recovery. Orientation:   The focus of this group is to educate the patient on the purpose and policies of crisis stabilization and provide a format to answer questions about their admission.  The group details unit policies and expectations of patients while admitted.  Participation Level:  Active  Participation Quality:  Appropriate  Affect:  Appropriate  Cognitive:  Appropriate  Insight: Appropriate  Engagement in Group:  Engaged  Modes of Intervention:  Discussion  Additional Comments:  Pt attended the goals group and remained appropriate and engaged throughout the duration of the group.   Prajwal Fellner O 09/13/2023, 7:41 PM

## 2023-09-13 NOTE — Plan of Care (Signed)
   Problem: Education: Goal: Emotional status will improve Outcome: Progressing Goal: Mental status will improve Outcome: Progressing   Problem: Activity: Goal: Interest or engagement in activities will improve Outcome: Progressing Goal: Sleeping patterns will improve Outcome: Progressing   Problem: Safety: Goal: Periods of time without injury will increase Outcome: Progressing

## 2023-09-13 NOTE — Progress Notes (Signed)
(  Sleep Hours) - 6 (Any PRNs that were needed, meds refused, or side effects to meds)- none (Any disturbances and when (visitation, over night)-none (Concerns raised by the patient)- none (SI/HI/AVH)- denies

## 2023-09-13 NOTE — Group Note (Signed)
 Date:  09/13/2023 Time:  8:32 PM  Group Topic/Focus:  Wrap-Up Group:   The focus of this group is to help patients review their daily goal of treatment and discuss progress on daily workbooks.    Participation Level:  Active  Participation Quality:  Appropriate  Affect:  Appropriate  Cognitive:  Appropriate  Insight: Appropriate  Engagement in Group:  Engaged  Modes of Intervention:  Education and Exploration  Additional Comments:  Patient attended and participated in group tonight. He reports that his goal today was to sleep which he did.  Gwenn Chillington Dacosta 09/13/2023, 8:32 PM

## 2023-09-13 NOTE — Plan of Care (Signed)
   Problem: Education: Goal: Emotional status will improve Outcome: Progressing Goal: Mental status will improve Outcome: Progressing Goal: Verbalization of understanding the information provided will improve Outcome: Progressing

## 2023-09-13 NOTE — Progress Notes (Signed)
(  Sleep Hours) -8.5  (Any PRNs that were needed, meds refused, or side effects to meds)- Trazodone  50 mg , Vistaril  25 mg  (Any disturbances and when (visitation, over night)-N/A  (Concerns raised by the patient)- Pt stated he wanted to know when he would be D/C , pt informed not long after his second LAI  (SI/HI/AVH)- denies

## 2023-09-13 NOTE — Progress Notes (Signed)
 [facility] MD Progress Note  09/13/2023 2:16 PM [name]  MRN:  [id number]  Principal Problem: Schizoaffective disorder [facility]) Diagnosis: Principal Problem:   Schizoaffective disorder (HCC)   Reason for Admission:  [name] is a 63 y.o. male  with a past psychiatric history of severe schizoaffective disorder, bipolar type. Was hospitalized at [facility] for two months in 2024, lives in [facility] group home, and is under guardianship [name]). Patient initially arrived to [facility] on 8/30 from [facility] for delusional thinking and aggressive behavior. Was admitted to the gero-psych unit at [facility] 8/30 where these behaviors continued: hollering in the hallway, entering other patient's rooms, verbally threatening security staff, attempting to punch an RN. Was then Transferred to [facility] under IVC on 09/09/2023 to the 500-level unit as his behaviors demanded a higher level of care. PMHx is significant for hypertension.  (admitted on 09/08/2023, total  LOS: 5 days )   Overnight: No concerns from nursing about patient being hypersexual.  Slept 6 hours.  On interview:  Patient reports he is doing good.  Denies side effects to medications. Pt denies extrapyramidal symptoms including dystonia (sudden spastic contractions of muscle groups), parkinsonism (bradykinesia, tremors, rigidity), and akathisia (severe restlessness).  Asking when he can leave.  Reports eating well.  Talks about random topics throughout interview.  Denies AVH and paranoia.      Past Psychiatric History: Current psychiatrist: Dr. Emile Dover from [facility], visits  Current therapist: None Previous psychiatric diagnoses: Schizoaffective disorder, bipolar type.  Current psychiatric medications: tegretol  200 BID, thorazine  200 mg at bedtime, klonopin  1 mg BID, prolixin  5 mg BID, Trazodone  100 mg at bedtime  Psychiatric medication history/compliance: Patient reports compliance recently, per chart  review unclear if he was reciving his medications.  Psychiatric hospitalization(s): a few times most recently at [facility] 08/10/2022-10/02/2022 Psychotherapy history: Unknown Neuromodulation history: Unknown History of suicide (obtained from HPI): Denies presently, none noted in recent chart review History of homicide or aggression (obtained in HPI): Patient has a history of violence against staff, has recently been throwing furniture, trying to punch staff, banging on windows.    Substance Abuse History:   None, as above.   Past Medical History:   HTN, per chart. Otherwise, patient denies.    Social History: Living situation: Group home as above Education: Some college Occupational history: Unknown Marital status: Single. Children: Unknown Legal: None presently Military: None   Access to firearms: None, lives at group home.    Family Psychiatric History:   Patient denies   Family Medical History:   None pertinent Past Medical History:  Past Medical History:  Diagnosis Date   Hypertension    Schizoaffective disorder, bipolar type (HCC)    Schizophrenia (HCC)    Family History: History reviewed. No pertinent family history.  Current Medications: Current Facility-Administered Medications  Medication Dose Route Frequency Provider Last Rate Last Admin   acetaminophen  (TYLENOL ) tablet 650 mg  650 mg Oral Q6H PRN Jadapalle, Sree, MD       alum & mag hydroxide-simeth (MAALOX/MYLANTA) 200-200-20 MG/5ML suspension 30 mL  30 mL Oral Q4H PRN Jadapalle, Sree, MD       amLODipine  (NORVASC ) tablet 10 mg  10 mg Oral Daily Madaram, Kondal R, MD   10 mg at 09/13/23 0840   atorvastatin  (LIPITOR) tablet 40 mg  40 mg Oral QHS Madaram, Kondal R, MD   40 mg at 09/12/23 2037   carbamazepine  (TEGRETOL  XR) 12 hr tablet 200  mg  200 mg Oral BID Madaram, Kondal R, MD   200 mg at 09/13/23 0840   chlorproMAZINE  (THORAZINE ) tablet 200 mg  200 mg Oral QHS Madaram, Kondal R, MD   200 mg at 09/12/23 2037    clonazePAM  (KLONOPIN ) tablet 1 mg  1 mg Oral TID Rollene Katz, MD   1 mg at 09/13/23 1103   diphenhydrAMINE  (BENADRYL ) capsule 50 mg  50 mg Oral [id number] Madaram, Kondal R, MD   50 mg at 09/13/23 0840   diphenhydrAMINE  (BENADRYL ) injection 50 mg  50 mg Intramuscular Once PRN Rollene Katz, MD       hydrOXYzine  (ATARAX ) tablet 25 mg  25 mg Oral TID PRN Donnelly Mellow, MD   25 mg at 09/11/23 2035   losartan  (COZAAR ) tablet 100 mg  100 mg Oral Daily Madaram, Kondal R, MD   100 mg at 09/13/23 9096   magnesium  hydroxide (MILK OF MAGNESIA) suspension 30 mL  30 mL Oral Daily PRN Donnelly Mellow, MD       metoprolol  tartrate (LOPRESSOR ) tablet 25 mg  25 mg Oral BID Madaram, Kondal R, MD   25 mg at 09/13/23 0840   nicotine  polacrilex (NICORETTE ) gum 2 mg  2 mg Oral Q4H PRN Rollene Katz, MD       OLANZapine  (ZYPREXA ) injection 10 mg  10 mg Intramuscular TID PRN Donnelly Mellow, MD   10 mg at 09/09/23 1528   OLANZapine  (ZYPREXA ) injection 5 mg  5 mg Intramuscular TID PRN Donnelly Mellow, MD       OLANZapine  zydis (ZYPREXA ) disintegrating tablet 5 mg  5 mg Oral TID PRN Jadapalle, Sree, MD   5 mg at 09/09/23 0931   [START ON 09/15/2023] paliperidone  (INVEGA  SUSTENNA) injection 156 mg  156 mg Intramuscular Once Braxden Lovering, MD       NOREEN ON 10/13/2023] paliperidone  (INVEGA  SUSTENNA) injection 234 mg  234 mg Intramuscular Q28 days Cornelius Dines, MD       risperiDONE  (RISPERDAL ) tablet 3 mg  3 mg Oral BID Averyana Pillars, MD   3 mg at 09/13/23 0840   traZODone  (DESYREL ) tablet 100 mg  100 mg Oral QHS Madaram, Kondal R, MD   100 mg at 09/12/23 2038   traZODone  (DESYREL ) tablet 50 mg  50 mg Oral QHS PRN Jadapalle, Sree, MD   50 mg at 09/11/23 2100     Blood Alcohol level:  Lab Results  Component Value Date   [facility] <15 09/04/2023   ETH <10 08/06/2022    Metabolic Labs: Lab Results  Component Value Date   HGBA1C 4.7 (L) 09/10/2023   MPG 88.19 09/10/2023   MPG 103 08/16/2022   No  results found for: PROLACTIN Lab Results  Component Value Date   CHOL 156 09/10/2023   TRIG 79 09/10/2023   HDL 49 09/10/2023   CHOLHDL 3.2 09/10/2023   VLDL 16 09/10/2023   LDLCALC 91 09/10/2023   LDLCALC 84 08/16/2022    Physical Findings: AIMS: AIMS: Facial and Oral Movements Muscles of Facial Expression: None Lips and Perioral Area: None Jaw: None Tongue: None,Extremity Movements Upper (arms, wrists, hands, fingers): None Lower (legs, knees, ankles, toes): None, Trunk Movements Neck, shoulders, hips: None, Global Judgements Severity of abnormal movements overall : None Incapacitation due to abnormal movements: None Patient's awareness of abnormal movements: No Awareness, Dental Status Current problems with teeth and/or dentures?: No Does patient usually wear dentures?: No Edentia?: Yes  Total score: 0  Tremors:No Cogwheeling:No Rigidity:No    Psychiatric Specialty Exam:  Presentation  General Appearance: Casual  Eye Contact:Fair  Speech:Clear and Coherent (somewhat garbled d/t poor dentition)  Speech Volume:Normal  Handedness:Right   Mood and Affect  Mood:Anxious  Affect:Congruent   Thought Process  Thought Processes:Goal Directed; Irrevelant  Descriptions of Associations:Intact  Orientation:Full (Time, Place and Person)  Thought Content:Scattered; Perseveration  History of Schizophrenia/Schizoaffective disorder:Yes  Duration of Psychotic Symptoms: chronic Hallucinations:Hallucinations: None   Ideas of Reference:None  Suicidal Thoughts:Suicidal Thoughts: No   Homicidal Thoughts:Homicidal Thoughts: No    Sensorium  Memory:Immediate Fair  Judgment:Fair  Insight:Poor   Executive Functions  Concentration:Fair  Attention Span:Fair  Recall:Fair  Fund of Knowledge:Fair  Language:Fair   Psychomotor Activity  Psychomotor Activity: normal, no eps  Assets  Assets:Communication Skills; Desire for Improvement   Sleep   Sleep:Sleep: Fair     Physical Exam: Physical Exam Vitals and nursing note reviewed.  HENT:     Head: Normocephalic and atraumatic.  Pulmonary:     Effort: Pulmonary effort is normal.  Neurological:     General: No focal deficit present.     Mental Status: He is alert.     Comments: No pain, stiffness, cogwheeling on passive ROM of bilateral upper extremities   Psychiatric:     Comments: No obvious EPS.    Review of Systems  Constitutional:  Negative for fever.  Cardiovascular:  Negative for chest pain and palpitations.  Gastrointestinal:  Negative for constipation, diarrhea, nausea and vomiting.  Neurological:  Negative for dizziness, weakness and headaches.  Psychiatric/Behavioral:         Pt denies extrapyramidal symptoms including dystonia (sudden spastic contractions of muscle groups), parkinsonism (bradykinesia, tremors, rigidity), and akathisia (severe restlessness).    Blood pressure 125/81, pulse 78, temperature 97.7 F (36.5 C), temperature source Oral, resp. rate 18, height 5' 7 (1.702 m), weight 64.8 kg, SpO2 98%. Body mass index is 22.37 kg/m.  Treatment Plan Summary: Daily contact with patient to assess and evaluate symptoms and progress in treatment and Medication management     ASSESSMENT:    Diagnoses / Active Problems: Schizoaffective disorder, bipolar type, in current episode.  [name] is a 63 y.o. male  with a past psychiatric history of severe schizoaffective disorder, bipolar type. Was hospitalized at [facility] for two months in 2024, lives in [facility] group home, and is under guardianship [name]). Patient initially arrived to [facility] on 8/30 from [facility] for delusional thinking and aggressive behavior. Was admitted to the gero-psych unit at [facility] 8/30 where these behaviors continued: hollering in the hallway, entering other patient's rooms, verbally threatening security staff, attempting to punch an RN. Was  then Transferred to [facility] under IVC on 09/09/2023 to the 500-level unit as his behaviors demanded a higher level of care. PMHx is significant for hypertension.   Received Invega  injection 9/6 without complication.  Next dose will be 156 mg on 9/9.  After that he will do 234 mg once monthly.  Plan for transfer back to group home on 9/9 or 9/10 after second Invega  loading dose.  No concerns about hypersexuality since increasing risperidone  to 3 mg twice daily on 9/6.  Recommend notifying group home on Monday that patient will be psychiatrically stable on Tuesday.   PLAN:   Safety and Monitoring: -  VOLUNTARY with guardian consent, admission to inpatient psychiatric unit for safety, stabilization and treatment. - Daily contact with patient to assess and evaluate symptoms and progress in treatment - Patient's case to be discussed in multi-disciplinary  team meeting -  Observation Level : q15 minute checks -  Vital signs:  q12 hours -  Precautions: suicide, elopement, and assault   2. Psychiatric Diagnoses and Treatment:     # Schizoaffective disorder, bipolar type - Continue home thorazine  200 mg at bedtime for psychosis. - Continue Risperdal  3 mg BID to target manic symptomatology - Received Invega  Sustenna 234 mg IM 9/6 AM, nex dose 156 mg LAI 9/9 AM, maintenance dosing moving forward will be 234 mg once monthly  - IM Benadryl  50 mg once PRN for acute dystonia ordered.  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 - PRNs: Olanzapine  zydis 5 mg TID PRN for mild agitation, IM Olanzapine  5 mg TID PRN for moderate agitation, and IM Olanzapine  10 mg TID PRN for severe agitation. Consider alternative PRNs  - Continue Benadryl  50 mg BID - The risks/benefits/side-effects/alternatives to this medication were discussed in detail with the patient and time was given for questions. The patient consents to medication trial.  -  Metabolic profile and EKG monitoring obtained while on an atypical antipsychotic  BMI: 22 TSH: WNL Lipid panel: WNL HbgA1c: 4.7% QTc: 442  - Encouraged patient to participate in unit milieu and in scheduled group therapies  - Short Term Goals: Ability to verbalize feelings will improve, Ability to demonstrate self-control will improve, and Ability to identify and develop effective coping behaviors will improve - Long Term Goals: Improvement in symptoms so as ready for discharge   Other PRNS: agitation as above, anxiety, sleep, constipation    Other labs reviewed on admission:                3. Medical Issues Being Addressed:    # Tobacco Use Disorder  - Nicotine  gum ordered - Smoking cessation encouraged.   4. Discharge Planning:    - Estimated discharge date: 9/10  - Social work and case management to assist with discharge planning and identification of hospital follow-up needs prior to discharge. - Discharge concerns: Need to establish a safety plan; medication compliance and effectiveness. - Discharge goals: Return home with outpatient referrals for mental health follow-up including medication management/psychotherapy.   I certify that inpatient services furnished can reasonably be expected to improve the patient's condition.     NB: This note was created using a voice recognition software as a result there may be grammatical errors inadvertently enclosed that do not reflect the nature of this encounter. Every attempt is made to correct such errors.   Justino Cornish, MD PGY-2 Psychiatry Resident 09/13/2023, 2:16 PM

## 2023-09-13 NOTE — Group Note (Signed)
 [facility] LCSW Group Therapy Note   Group Date: 09/13/2023 Start Time: 1100 End Time: 1200   Type of Therapy/Topic:  Group Therapy:  Balancing Life with Trust, Honesty, and Boundaries.   Participation Level:  Active   Description of Group:    This group will address the concept of balance and how it feels and looks when one is unbalanced. Patients will be encouraged to process areas in their lives that are out of balance, and identify reasons for remaining unbalanced. Facilitators will guide patients utilizing problem- solving interventions to address and correct the stressor making their life unbalanced. Understanding and applying boundaries will be explored and addressed for obtaining  and maintaining a balanced life. Patients will be encouraged to explore ways to assertively make their unbalanced needs known to significant others in their lives, using other group members and facilitator for support and feedback.  Therapeutic Goals: Patient will identify two or more emotions or situations they have that consume much of in their lives. Patient will identify signs/triggers that life has become out of balance:  Patient will identify two ways to set boundaries in order to achieve balance in their lives:  Patient will demonstrate ability to communicate their needs through discussion and/or role plays  Summary of Patient Progress: Patient shared how he is able to reciprocate positivity and trustworthiness with friends. Patient shared value of trust and honesty with example, patient participated in role-playing exercises. Patient shared most admirable trait is his ability to socialize.     Therapeutic Modalities:   Cognitive Behavioral Therapy Solution-Focused Therapy Assertiveness Training   [place], LCSWA

## 2023-09-14 ENCOUNTER — Encounter (HOSPITAL_COMMUNITY): Payer: Self-pay

## 2023-09-14 DIAGNOSIS — F250 Schizoaffective disorder, bipolar type: Principal | ICD-10-CM

## 2023-09-14 NOTE — Progress Notes (Signed)
 [facility] MD Progress Note  09/14/2023 2:41 PM [name]  MRN:  [id number]  Principal Problem: Schizoaffective disorder [facility]) Diagnosis: Principal Problem:   Schizoaffective disorder (HCC)   Reason for Admission:  [name] is a 63 y.o. male  with a past psychiatric history of severe schizoaffective disorder, bipolar type. Was hospitalized at [facility] for two months in 2024, lives in [facility] group home, and is under guardianship [name] [employer]). Patient initially arrived to [facility] on 8/30 from [facility] for delusional thinking and aggressive behavior. Was admitted to the gero-psych unit at [facility] 8/30 where these behaviors continued: hollering in the hallway, entering other patient's rooms, verbally threatening security staff, attempting to punch an RN. Was then Transferred to [facility] under IVC on 09/09/2023 to the 500-level unit as his behaviors demanded a higher level of care. PMHx is significant for hypertension.  (admitted on 09/08/2023, total  LOS: 6 days )   Overnight: No concerns from nursing about patient being hypersexual.  Slept 8.5 hours. Denied SI/HI/AVH. Received atarax  x1 and hydroxyzine  x1.   On interview:  Patient doing well. Sleeping OK. This morning, very goal directed and excited about discharge over the next couple of days. Denies suicidal and homicidal ideation. Denies auditory and visual hallucinations. Very excited to return to group home. Understands that he can return to [facility] if necessary in the future for further treatment.   Past Psychiatric History: Current psychiatrist: Dr. Emile Dover from [facility], visits  Current therapist: None Previous psychiatric diagnoses: Schizoaffective disorder, bipolar type.  Current psychiatric medications: tegretol  200 BID, thorazine  200 mg at bedtime, klonopin  1 mg BID, prolixin  5 mg BID, Trazodone  100 mg at bedtime  Psychiatric medication history/compliance: Patient reports compliance recently, per chart  review unclear if he was reciving his medications.  Psychiatric hospitalization(s): a few times most recently at [facility] 08/10/2022-10/02/2022 Psychotherapy history: Unknown Neuromodulation history: Unknown History of suicide (obtained from HPI): Denies presently, none noted in recent chart review History of homicide or aggression (obtained in HPI): Patient has a history of violence against staff, has recently been throwing furniture, trying to punch staff, banging on windows.    Substance Abuse History:   None, as above.   Past Medical History:   HTN, per chart. Otherwise, patient denies.    Social History: Living situation: Group home as above Education: Some college Occupational history: Unknown Marital status: Single. Children: Unknown Legal: None presently Military: None   Access to firearms: None, lives at group home.    Family Psychiatric History:   Patient denies   Family Medical History:   None pertinent Past Medical History:  Past Medical History:  Diagnosis Date   Hypertension    Schizoaffective disorder, bipolar type (HCC)    Schizophrenia (HCC)    Family History: History reviewed. No pertinent family history.  Current Medications: Current Facility-Administered Medications  Medication Dose Route Frequency Provider Last Rate Last Admin   acetaminophen  (TYLENOL ) tablet 650 mg  650 mg Oral Q6H PRN Donnelly Mellow, MD       alum & mag hydroxide-simeth (MAALOX/MYLANTA) 200-200-20 MG/5ML suspension 30 mL  30 mL Oral Q4H PRN Jadapalle, Sree, MD       amLODipine  (NORVASC ) tablet 10 mg  10 mg Oral Daily Madaram, Kondal R, MD   10 mg at 09/14/23 9187   atorvastatin  (LIPITOR) tablet 40 mg  40 mg Oral QHS Madaram, Kondal R, MD   40 mg at 09/13/23 2032   carbamazepine  (TEGRETOL  XR)  12 hr tablet 200 mg  200 mg Oral BID Madaram, Kondal R, MD   200 mg at 09/14/23 0813   chlorproMAZINE  (THORAZINE ) tablet 200 mg  200 mg Oral QHS Madaram, Kondal R, MD   200 mg at 09/13/23 2032    clonazePAM  (KLONOPIN ) tablet 1 mg  1 mg Oral TID Rollene Katz, MD   1 mg at 09/14/23 1159   diphenhydrAMINE  (BENADRYL ) capsule 50 mg  50 mg Oral [id number] Madaram, Kondal R, MD   50 mg at 09/14/23 9187   diphenhydrAMINE  (BENADRYL ) injection 50 mg  50 mg Intramuscular Once PRN Rollene Katz, MD       hydrOXYzine  (ATARAX ) tablet 25 mg  25 mg Oral TID PRN Donnelly Mellow, MD   25 mg at 09/13/23 2032   losartan  (COZAAR ) tablet 100 mg  100 mg Oral Daily Madaram, Kondal R, MD   100 mg at 09/14/23 9187   magnesium  hydroxide (MILK OF MAGNESIA) suspension 30 mL  30 mL Oral Daily PRN Donnelly Mellow, MD       metoprolol  tartrate (LOPRESSOR ) tablet 25 mg  25 mg Oral BID Madaram, Kondal R, MD   25 mg at 09/14/23 9187   nicotine  polacrilex (NICORETTE ) gum 2 mg  2 mg Oral Q4H PRN Rollene Katz, MD       OLANZapine  (ZYPREXA ) injection 10 mg  10 mg Intramuscular TID PRN Donnelly Mellow, MD   10 mg at 09/09/23 1528   OLANZapine  (ZYPREXA ) injection 5 mg  5 mg Intramuscular TID PRN Donnelly Mellow, MD       OLANZapine  zydis (ZYPREXA ) disintegrating tablet 5 mg  5 mg Oral TID PRN Jadapalle, Sree, MD   5 mg at 09/09/23 0931   [START ON 09/15/2023] paliperidone  (INVEGA  SUSTENNA) injection 156 mg  156 mg Intramuscular Once McCarty, Artie, MD       NOREEN ON 10/13/2023] paliperidone  (INVEGA  SUSTENNA) injection 234 mg  234 mg Intramuscular Q28 days Cornelius Dines, MD       risperiDONE  (RISPERDAL ) tablet 3 mg  3 mg Oral BID McCarty, Artie, MD   3 mg at 09/14/23 0813   traZODone  (DESYREL ) tablet 100 mg  100 mg Oral QHS Madaram, Kondal R, MD   100 mg at 09/13/23 2032   traZODone  (DESYREL ) tablet 50 mg  50 mg Oral QHS PRN Jadapalle, Sree, MD   50 mg at 09/13/23 2115     Blood Alcohol level:  Lab Results  Component Value Date   [facility] <15 09/04/2023   ETH <10 08/06/2022    Metabolic Labs: Lab Results  Component Value Date   HGBA1C 4.7 (L) 09/10/2023   MPG 88.19 09/10/2023   MPG 103 08/16/2022   No  results found for: PROLACTIN Lab Results  Component Value Date   CHOL 156 09/10/2023   TRIG 79 09/10/2023   HDL 49 09/10/2023   CHOLHDL 3.2 09/10/2023   VLDL 16 09/10/2023   LDLCALC 91 09/10/2023   LDLCALC 84 08/16/2022    Physical Findings: AIMS: AIMS: Facial and Oral Movements Muscles of Facial Expression: None Lips and Perioral Area: None Jaw: None Tongue: None,Extremity Movements Upper (arms, wrists, hands, fingers): None Lower (legs, knees, ankles, toes): None, Trunk Movements Neck, shoulders, hips: None, Global Judgements Severity of abnormal movements overall : None Incapacitation due to abnormal movements: None Patient's awareness of abnormal movements: No Awareness, Dental Status Current problems with teeth and/or dentures?: No Does patient usually wear dentures?: No Edentia?: Yes  Total score: 0  Tremors:No Cogwheeling:No Rigidity:No  Psychiatric Specialty Exam:  Presentation  General Appearance: Casual  Eye Contact:Fair  Speech:Clear and Coherent (somewhat garbled d/t poor dentition)  Speech Volume:Normal  Handedness:Right   Mood and Affect  Mood:Anxious  Affect:Congruent   Thought Process  Thought Processes:Goal Directed; Irrevelant  Descriptions of Associations:Intact  Orientation:Full (Time, Place and Person)  Thought Content:Scattered; Perseveration  History of Schizophrenia/Schizoaffective disorder:Yes  Duration of Psychotic Symptoms: chronic Hallucinations:Hallucinations: None   Ideas of Reference:None  Suicidal Thoughts:Suicidal Thoughts: No   Homicidal Thoughts:Homicidal Thoughts: No    Sensorium  Memory:Immediate Fair  Judgment:Fair  Insight:Poor   Executive Functions  Concentration:Fair  Attention Span:Fair  Recall:Fair  Fund of Knowledge:Fair  Language:Fair   Psychomotor Activity  Psychomotor Activity: normal, no eps  Assets  Assets:Communication Skills; Desire for Improvement   Sleep   Sleep:Sleep: Fair     Physical Exam: Physical Exam Vitals and nursing note reviewed.  HENT:     Head: Normocephalic and atraumatic.  Pulmonary:     Effort: Pulmonary effort is normal.  Neurological:     General: No focal deficit present.     Mental Status: He is alert.     Comments: No pain, stiffness, cogwheeling on passive ROM of bilateral upper extremities   Psychiatric:     Comments: No obvious EPS.    Review of Systems  Constitutional:  Negative for fever.  Cardiovascular:  Negative for chest pain and palpitations.  Gastrointestinal:  Negative for constipation, diarrhea, nausea and vomiting.  Neurological:  Negative for dizziness, weakness and headaches.  Psychiatric/Behavioral:         Pt denies extrapyramidal symptoms including dystonia (sudden spastic contractions of muscle groups), parkinsonism (bradykinesia, tremors, rigidity), and akathisia (severe restlessness).    Blood pressure 139/67, pulse 69, temperature 97.8 F (36.6 C), temperature source Oral, resp. rate 18, height 5' 7 (1.702 m), weight 64.8 kg, SpO2 100%. Body mass index is 22.37 kg/m.  Treatment Plan Summary: Daily contact with patient to assess and evaluate symptoms and progress in treatment and Medication management     ASSESSMENT:    Diagnoses / Active Problems: Schizoaffective disorder, bipolar type, in current episode.  [name] is a 63 y.o. male  with a past psychiatric history of severe schizoaffective disorder, bipolar type. Was hospitalized at [facility] for two months in 2024, lives in [facility] group home, and is under guardianship [name] [facility]). Patient initially arrived to [facility] on 8/30 from [facility] for delusional thinking and aggressive behavior. Was admitted to the gero-psych unit at [facility] 8/30 where these behaviors continued: hollering in the hallway, entering other patient's rooms, verbally threatening security staff, attempting to punch an RN. Was  then Transferred to [facility] under IVC on 09/09/2023 to the 500-level unit as his behaviors demanded a higher level of care. PMHx is significant for hypertension.   No new changes today. Discussed with Devan Manley social worker next steps -- still has 20 days left at group home, can be discharged there while other placement found. Patient much better over weekend, less intrusive, more interruptable. Excited for discharge but is able to participate in interview. Will receive Invega  156 mg tomorrow and likely DC then.   PLAN:   Safety and Monitoring: -  VOLUNTARY with guardian consent, admission to inpatient psychiatric unit for safety, stabilization and treatment. - Daily contact with patient to assess and evaluate symptoms and progress in treatment - Patient's case to be discussed in multi-disciplinary team meeting -  Observation Level : q15 minute checks -  Vital signs:  q12 hours -  Precautions: suicide, elopement, and assault   2. Psychiatric Diagnoses and Treatment:     # Schizoaffective disorder, bipolar type - Continue home thorazine  200 mg at bedtime for psychosis. - Continue Risperdal  3 mg BID to target manic symptomatology - Received Invega  Sustenna 234 mg IM 9/6 AM, next dose 156 mg LAI 9/9 AM, maintenance dosing moving forward will be 234 mg once monthly  - IM Benadryl  50 mg once PRN for acute dystonia ordered.  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 - PRNs: Olanzapine  zydis 5 mg TID PRN for mild agitation, IM Olanzapine  5 mg TID PRN for moderate agitation, and IM Olanzapine  10 mg TID PRN for severe agitation. Consider alternative PRNs  - Continue Benadryl  50 mg BID - The risks/benefits/side-effects/alternatives to this medication were discussed in detail with the patient and time was given for questions. The patient consents to medication trial.  - Metabolic profile and EKG monitoring obtained while  on an atypical antipsychotic  BMI: 22 TSH: WNL Lipid panel: WNL HbgA1c: 4.7% QTc: 442  - Encouraged patient to participate in unit milieu and in scheduled group therapies  - Short Term Goals: Ability to verbalize feelings will improve, Ability to demonstrate self-control will improve, and Ability to identify and develop effective coping behaviors will improve - Long Term Goals: Improvement in symptoms so as ready for discharge   Other PRNS: agitation as above, anxiety, sleep, constipation    Other labs reviewed on admission:                3. Medical Issues Being Addressed:    # Tobacco Use Disorder  - Nicotine  gum ordered - Smoking cessation encouraged.   4. Discharge Planning:    - Estimated discharge date: 9/9-9/10 - Social work and case management to assist with discharge planning and identification of hospital follow-up needs prior to discharge. - Discharge concerns: Need to establish a safety plan; medication compliance and effectiveness. - Discharge goals: Return home with outpatient referrals for mental health follow-up including medication management/psychotherapy.   I certify that inpatient services furnished can reasonably be expected to improve the patient's condition.     NB: This note was created using a voice recognition software as a result there may be grammatical errors inadvertently enclosed that do not reflect the nature of this encounter. Every attempt is made to correct such errors.   Odis Cleveland, MD PGY-2 Psychiatry Resident 09/14/2023, 2:41 PM

## 2023-09-14 NOTE — Group Note (Signed)
 Date:  09/14/2023 Time:  9:01 PM  Group Topic/Focus:  Wrap-Up Group:   The focus of this group is to help patients review their daily goal of treatment and discuss progress on daily workbooks.    Participation Level:  Did Not Attend   [name] 09/14/2023, 9:01 PM

## 2023-09-14 NOTE — BH IP Treatment Plan (Signed)
 Interdisciplinary Treatment and Diagnostic Plan Update  09/14/2023 Time of Session: 9:50AM - UPDATE [name] MRN: [id number]  Principal Diagnosis: Schizoaffective disorder (HCC)  Secondary Diagnoses: Principal Problem:   Schizoaffective disorder (HCC)   Current Medications:  Current Facility-Administered Medications  Medication Dose Route Frequency Provider Last Rate Last Admin   acetaminophen  (TYLENOL ) tablet 650 mg  650 mg Oral Q6H PRN Jadapalle, Sree, MD       alum & mag hydroxide-simeth (MAALOX/MYLANTA) 200-200-20 MG/5ML suspension 30 mL  30 mL Oral Q4H PRN Jadapalle, Sree, MD       amLODipine  (NORVASC ) tablet 10 mg  10 mg Oral Daily Madaram, Kondal R, MD   10 mg at 09/14/23 9187   atorvastatin  (LIPITOR) tablet 40 mg  40 mg Oral QHS Madaram, Kondal R, MD   40 mg at 09/13/23 2032   carbamazepine  (TEGRETOL  XR) 12 hr tablet 200 mg  200 mg Oral BID Madaram, Kondal R, MD   200 mg at 09/14/23 0813   chlorproMAZINE  (THORAZINE ) tablet 200 mg  200 mg Oral QHS Madaram, Kondal R, MD   200 mg at 09/13/23 2032   clonazePAM  (KLONOPIN ) tablet 1 mg  1 mg Oral TID Rollene Katz, MD   1 mg at 09/14/23 9187   diphenhydrAMINE  (BENADRYL ) capsule 50 mg  50 mg Oral [id number] Madaram, Kondal R, MD   50 mg at 09/14/23 9187   diphenhydrAMINE  (BENADRYL ) injection 50 mg  50 mg Intramuscular Once PRN Rollene Katz, MD       hydrOXYzine  (ATARAX ) tablet 25 mg  25 mg Oral TID PRN Jadapalle, Sree, MD   25 mg at 09/13/23 2032   losartan  (COZAAR ) tablet 100 mg  100 mg Oral Daily Madaram, Kondal R, MD   100 mg at 09/14/23 9187   magnesium  hydroxide (MILK OF MAGNESIA) suspension 30 mL  30 mL Oral Daily PRN Donnelly Mellow, MD       metoprolol  tartrate (LOPRESSOR ) tablet 25 mg  25 mg Oral BID Madaram, Kondal R, MD   25 mg at 09/14/23 9187   nicotine  polacrilex (NICORETTE ) gum 2 mg  2 mg Oral Q4H PRN Rollene Katz, MD       OLANZapine  (ZYPREXA ) injection 10 mg  10 mg Intramuscular TID PRN Jadapalle,  Sree, MD   10 mg at 09/09/23 1528   OLANZapine  (ZYPREXA ) injection 5 mg  5 mg Intramuscular TID PRN Jadapalle, Sree, MD       OLANZapine  zydis (ZYPREXA ) disintegrating tablet 5 mg  5 mg Oral TID PRN Jadapalle, Sree, MD   5 mg at 09/09/23 0931   [START ON 09/15/2023] paliperidone  (INVEGA  SUSTENNA) injection 156 mg  156 mg Intramuscular Once McCarty, Artie, MD       NOREEN ON 10/13/2023] paliperidone  (INVEGA  SUSTENNA) injection 234 mg  234 mg Intramuscular Q28 days McCarty, Artie, MD       risperiDONE  (RISPERDAL ) tablet 3 mg  3 mg Oral BID McCarty, Artie, MD   3 mg at 09/14/23 0813   traZODone  (DESYREL ) tablet 100 mg  100 mg Oral QHS Madaram, Kondal R, MD   100 mg at 09/13/23 2032   traZODone  (DESYREL ) tablet 50 mg  50 mg Oral QHS PRN Jadapalle, Sree, MD   50 mg at 09/13/23 2115   PTA Medications: Medications Prior to Admission  Medication Sig Dispense Refill Last Dose/Taking   amLODipine  (NORVASC ) 10 MG tablet Take 1 tablet (10 mg total) by mouth daily. 30 tablet 3    atorvastatin  (LIPITOR) 40 MG tablet Take 1  tablet (40 mg total) by mouth at bedtime. 30 tablet 3    carbamazepine  (TEGRETOL  XR) 200 MG 12 hr tablet Take 1 tablet (200 mg total) by mouth 2 (two) times daily. 60 tablet 3    chlorproMAZINE  (THORAZINE ) 200 MG tablet Take 1 tablet (200 mg total) by mouth at bedtime. 30 tablet 3    clonazePAM  (KLONOPIN ) 1 MG tablet Take 1 tablet (1 mg total) by mouth 2 (two) times daily. 60 tablet 3    fluPHENAZine  (PROLIXIN ) 5 MG tablet Take 1 tablet (5 mg total) by mouth 2 (two) times daily at 8 am and 4 pm. 60 tablet 3    losartan  (COZAAR ) 100 MG tablet Take 1 tablet (100 mg total) by mouth daily. 30 tablet 3    metoprolol  tartrate (LOPRESSOR ) 25 MG tablet Take 1 tablet (25 mg total) by mouth 2 (two) times daily. 30 tablet 0    traZODone  (DESYREL ) 100 MG tablet Take 1 tablet (100 mg total) by mouth at bedtime. 30 tablet 3     Patient Stressors: Financial difficulties    Patient Strengths: Religious  Affiliation   Treatment Modalities: Medication Management, Group therapy, Case management,  1 to 1 session with clinician, Psychoeducation, Recreational therapy.   Physician Treatment Plan for Primary Diagnosis: Schizoaffective disorder (HCC) Long Term Goal(s):     Short Term Goals: Ability to verbalize feelings will improve Ability to demonstrate self-control will improve Ability to identify and develop effective coping behaviors will improve  Medication Management: Evaluate patient's response, side effects, and tolerance of medication regimen.  Therapeutic Interventions: 1 to 1 sessions, Unit Group sessions and Medication administration.  Evaluation of Outcomes: Progressing  Physician Treatment Plan for Secondary Diagnosis: Principal Problem:   Schizoaffective disorder (HCC)  Long Term Goal(s):     Short Term Goals: Ability to verbalize feelings will improve Ability to demonstrate self-control will improve Ability to identify and develop effective coping behaviors will improve     Medication Management: Evaluate patient's response, side effects, and tolerance of medication regimen.  Therapeutic Interventions: 1 to 1 sessions, Unit Group sessions and Medication administration.  Evaluation of Outcomes: Progressing   RN Treatment Plan for Primary Diagnosis: Schizoaffective disorder (HCC) Long Term Goal(s): Knowledge of disease and therapeutic regimen to maintain health will improve  Short Term Goals: Ability to remain free from injury will improve, Ability to verbalize frustration and anger appropriately will improve, Ability to demonstrate self-control, Ability to participate in decision making will improve, Ability to verbalize feelings will improve, Ability to disclose and discuss suicidal ideas, and Ability to identify and develop effective coping behaviors will improve  Medication Management: RN will administer medications as ordered by provider, will assess and evaluate  patient's response and provide education to patient for prescribed medication. RN will report any adverse and/or side effects to prescribing provider.  Therapeutic Interventions: 1 on 1 counseling sessions, Psychoeducation, Medication administration, Evaluate responses to treatment, Monitor vital signs and CBGs as ordered, Perform/monitor CIWA, COWS, AIMS and Fall Risk screenings as ordered, Perform wound care treatments as ordered.  Evaluation of Outcomes: Progressing   LCSW Treatment Plan for Primary Diagnosis: Schizoaffective disorder (HCC) Long Term Goal(s): Safe transition to appropriate next level of care at discharge, Engage patient in therapeutic group addressing interpersonal concerns.  Short Term Goals: Engage patient in aftercare planning with referrals and resources, Increase social support, Increase ability to appropriately verbalize feelings, Increase emotional regulation, Facilitate acceptance of mental health diagnosis and concerns, Facilitate patient progression through stages of change  regarding substance use diagnoses and concerns, and Identify triggers associated with mental health/substance abuse issues  Therapeutic Interventions: Assess for all discharge needs, 1 to 1 time with Social worker, Explore available resources and support systems, Assess for adequacy in community support network, Educate family and significant other(s) on suicide prevention, Complete Psychosocial Assessment, Interpersonal group therapy.  Evaluation of Outcomes: Progressing   PProgress in Treatment: Attending groups: yes, attended some groups Participating in groups: Yes. Taking medication as prescribed: Yes. Toleration medication: Yes. Family/Significant other contact made: Yes, individual(s) contacted:  Cassandra Massen from [employer] guardianship agency (patient has a legal guardian) Patient understands diagnosis: No. Discussing patient identified problems/goals with staff:  No. Medical problems stabilized or resolved: Yes. Denies suicidal/homicidal ideation: Yes. Issues/concerns per patient self-inventory: No.   New problem(s) identified:  No   New Short Term/Long Term Goal(s):     medication stabilization, elimination of SI thoughts, development of comprehensive mental wellness plan.      Patient Goals:  I want you to call my guardian.      Discharge Plan or Barriers:  Patient recently admitted. CSW will continue to follow and assess for appropriate referrals and possible discharge planning.      Reason for Continuation of Hospitalization: Delusions  Medication stabilization Psychosis   Estimated Length of Stay:  5 - 7 days  Last 3 [place] Suicide Severity Risk Score: Flowsheet Row [facility] Most recent reading at 09/08/2023 12:47 PM ED from 09/05/2023 in [facility] Department at [facility] Most recent reading at 09/05/2023  1:35 PM Admission (Discharged) from 09/05/2023 in [facility] Most recent reading at 09/05/2023 12:00 PM  C-SSRS RISK CATEGORY No Risk No Risk No Risk    Last PHQ 2/9 Scores:     No data to display          Scribe for Treatment Team: Yanai Hobson M Emiah Pellicano, [place] 09/14/2023 11:56 AM

## 2023-09-14 NOTE — Progress Notes (Signed)
 CSW called and spoke with patient's legal guardian [name] [phone] regarding discharge plan as pt is scheduled to d/c tomorrow. [place] reported speaking with a provider about an FL2 form so he can begin working securing another group home for patient, as patient has 20 days left before he's kicked out of his current place of residence. Informed provider of this.  Kennis Buell, LCSWA 09/14/23 2:52PM

## 2023-09-14 NOTE — Plan of Care (Signed)
   Problem: Education: Goal: Emotional status will improve Outcome: Progressing Goal: Mental status will improve Outcome: Progressing

## 2023-09-14 NOTE — Group Note (Signed)
 Recreation Therapy Group Note   Group Topic:Stress Management  Group Date: 09/14/2023 Start Time: 1025 End Time: 1053 Facilitators: Miyu Fenderson-McCall, LRT,CTRS Location: [address] Dayroom   Group Focus: Stress Management  Goal Area(s) Addresses:  Patient will actively participate in stress management techniques presented during session.  Patient will successfully identify benefit of practicing stress management post d/c.   Behavioral Response: Engaged  Intervention: Guided exercise with ambient sound and script  Activity : Progressive Muscle Relaxation. LRT provided education, instruction and demonstration on practice of Progressive Muscle Relaxation. Patients also participated in yoga poses to help stretch and release tension as well. LRT informed pts about resources to access pre-recorded scripts for PMR post d/c via Youtube and other apps or via internet with a smartphone, tablet, and/or computer.  Education:  Stress Management, Discharge Planning.   Education Outcome: Acknowledges education   Affect/Mood: Appropriate   Participation Level: Engaged   Participation Quality: Independent   Behavior: Appropriate   Speech/Thought Process: Focused   Insight: Good   Judgement: Good   Modes of Intervention: Stretch, Yoga   Patient Response to Interventions:  Engaged   Education Outcome:  In group clarification offered    Clinical Observations/Individualized Feedback: Pt was engaged and focused during group. Pt was attentive to how to complete each technique presented. Pt was appropriate during group session.     Plan: Continue to engage patient in RT group sessions 2-3x/week.   Martiza Speth-McCall, LRT,CTRS 09/14/2023 12:16 PM

## 2023-09-14 NOTE — Progress Notes (Signed)
   09/14/23 1100  Psychosocial Assessment  Patient Complaints Worrying  Eye Contact Fair  Facial Expression Animated  Affect Appropriate to circumstance  Speech Logical/coherent  Interaction Assertive  Motor Activity Slow  Appearance/Hygiene Unremarkable  Behavior Characteristics Cooperative  Mood Pleasant  Thought Process  Coherency Circumstantial  Content Preoccupation  Delusions WDL  Perception WDL  Hallucination None reported or observed  Judgment Impaired  Confusion None  Danger to Self  Current suicidal ideation? Denies  Danger to Others  Danger to Others None reported or observed

## 2023-09-14 NOTE — Progress Notes (Signed)
(  Sleep Hours) -  (Any PRNs that were needed, meds refused, or side effects to meds)- Vistaril  25mg , Trazodone  50mg   (Any disturbances and when (visitation, over night)-n/a  (Concerns raised by the patient)- n/a  (SI/HI/AVH)- denies

## 2023-09-14 NOTE — Plan of Care (Signed)
  Problem: Education: Goal: Emotional status will improve Outcome: Progressing Goal: Mental status will improve Outcome: Progressing   Problem: Education: Goal: Emotional status will improve Outcome: Progressing   Problem: Education: Goal: Mental status will improve Outcome: Progressing   Problem: Education: Goal: Mental status will improve Outcome: Progressing   Problem: Education: Goal: Emotional status will improve Outcome: Progressing

## 2023-09-14 NOTE — Progress Notes (Signed)
 CSW retrieved completed FL2 from provider and faxed it to [name] (patient's legal guardian) at [phone] as requested.  Salomon Ganser, LCSWA 09/14/23 3:30pm

## 2023-09-14 NOTE — Group Note (Signed)
 LCSW Group Therapy Note   Group Date: 09/14/2023 Start Time: 1300 End Time: 1400   Participation:  patient was present and actively participated in the discussion  Type of Therapy:  Group Therapy  Topic:  Stronger Together:  Building Healthy Relationships  Objective:  To explore loneliness, boundaries, and safe ways to build relationships.  Goals: Recognize healthy vs. unhealthy relationships. Learn safe ways to connect with others. Strengthen communication and [employer].  Summary:  Participants discussed loneliness, healthy connections, and setting boundaries. They explored safe ways to meet people and shared personal experiences. Key insights were reinforced through discussion and quotes.  Therapeutic Modalities Used: Cognitive Behavioral Therapy (CBT) Elements - Identifying unhealthy relationship patterns, challenging negative thoughts about connection. Dialectical Behavior Therapy (DBT) Elements - Interpersonal effectiveness, setting and maintaining boundaries. Supportive Group Therapy - Peer discussion, shared experiences, and emotional validation.   Marinus Eicher O Luismiguel Lamere, LCSWA 09/14/2023  6:05 PM

## 2023-09-15 MED ORDER — HYDROXYZINE HCL 25 MG PO TABS: 25.0000 mg | ORAL_TABLET | Freq: Three times a day (TID) | ORAL | 0 refills | Status: AC | PRN

## 2023-09-15 MED ORDER — PALIPERIDONE PALMITATE ER 234 MG/1.5ML IM SUSY: 234.0000 mg | PREFILLED_SYRINGE | INTRAMUSCULAR | 0 refills | Status: AC

## 2023-09-15 MED ORDER — CLONAZEPAM 1 MG PO TABS: 1.0000 mg | ORAL_TABLET | Freq: Three times a day (TID) | ORAL | 0 refills | Status: DC

## 2023-09-15 MED ORDER — TRAZODONE HCL 50 MG PO TABS: 50.0000 mg | ORAL_TABLET | Freq: Every evening | ORAL | 0 refills | Status: AC | PRN

## 2023-09-15 NOTE — Progress Notes (Signed)
 CSW called [name] (legal guardian) who reported patient will be picked up at 3:30pm today 9/9.  [name], LCSWA 09/15/23 10:51am

## 2023-09-15 NOTE — Progress Notes (Signed)
 Pt discharged to lobby. Pt was stable and appreciative at that time. All papers and prescriptions were given and valuables returned. Verbal understanding expressed. Denies SI/HI and A/VH. Pt given opportunity to express concerns and ask questions.

## 2023-09-15 NOTE — Group Note (Signed)
 Recreation Therapy Group Note   Group Topic:Communication  Group Date: 09/15/2023 Start Time: 1040 End Time: 1110 Facilitators: Kysen Wetherington-McCall, LRT,CTRS Location: [address] Dayroom   Group Topic: Communication, Problem Solving   Goal Area(s) Addresses:  Patient will effectively listen to complete activity.  Patient will identify communication skills used to make activity successful.  Patient will identify how skills used during activity can be used to reach post d/c goals.    Behavioral Response: Engaged   Intervention: Building surveyor Activity - Geometric pattern cards, pencils, blank paper    Activity: Geometric Drawings.  Three volunteers from the peer group will be shown an abstract picture with a particular arrangement of geometrical shapes.  Each round, one 'speaker' will describe the pattern, as accurately as possible without revealing the image to the group.  The remaining group members will listen and draw the picture to reflect how it is described to them. Patients with the role of 'listener' cannot ask clarifying questions but, may request that the speaker repeat a direction. Once the drawings are complete, the presenter will show the rest of the group the picture and compare how close each person came to drawing the picture. LRT will facilitate a post-activity discussion regarding effective communication and the importance of planning, listening, and asking for clarification in daily interactions with others.  Education: Environmental consultant, Active listening, Support systems, Discharge planning  Education Outcome: Acknowledges understanding/In group clarification offered/Needs additional education.    Affect/Mood: Appropriate   Participation Level: Engaged   Participation Quality: Independent   Behavior: Appropriate   Speech/Thought Process: Focused   Insight: Good   Judgement: Good   Modes of Intervention: Activity and Problem-solving   Patient  Response to Interventions:  Engaged   Education Outcome:  In group clarification offered    Clinical Observations/Individualized Feedback: Pt was the first presenter. Pt wasn't as detailed when giving instructions to peers. Pt used mainly hand motions to show peers what shapes he was describing and the size of them. Pt was on task and focused during group.      Plan: Continue to engage patient in RT group sessions 2-3x/week.   Taylen Osorto-McCall, LRT,CTRS 09/15/2023 1:11 PM
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
## 2023-09-15 NOTE — Discharge Instructions (Addendum)
-  Follow-up with your outpatient psychiatric provider -instructions on appointment date, time, and address (location) are provided to you in discharge paperwork.  -Take your psychiatric medications as prescribed at discharge - instructions are provided to you in the discharge paperwork  -Follow-up with outpatient primary care doctor and other specialists -for management of chronic medical disease, including: hypertension, hyperlipidemia (appears resolved), tobacco use disorder.   -Recommend abstinence from alcohol, tobacco, and other illicit drug use at discharge.   -If your psychiatric symptoms recur, worsen, or if you have side effects to your psychiatric medications, call your outpatient psychiatric provider, 911, [phone] or go to the nearest emergency department.  -If suicidal thoughts recur, call your outpatient psychiatric provider, 911, [phone] or go to the nearest emergency department.

## 2023-09-15 NOTE — Progress Notes (Signed)
  [facility] Adult Case Management Discharge Plan :  Will you be returning to the same living situation after discharge:  Yes,  pt returning to group home after discharge.  At discharge, do you have transportation home?: Yes,  pt to be picked up by group home at 3:30PM. Do you have the ability to pay for your medications: Yes,  pt is insured - Medicare Part A & B  Release of information consent forms completed and in the chart;  Patient's signature needed at discharge.  Patient to Follow up at:  Follow-up Information     [facility] Follow up.   Why: Please continue with your provider [facility], Dr. Emile Dover ,  for therapy services [phone]), at your group home [facility]. Contact information: [address] [place] [address] [phone]                 Next level of care provider has access to [facility] Link:no  Safety Planning and Suicide Prevention discussed: Yes,  completed with [name] [facility] (legal guardian) [phone]     Has patient been referred to the Quitline?: Patient refused referral for treatment  Patient has been referred for addiction treatment: No known substance use disorder.  Ayodele Hartsock M Audrena Talaga, LCSWA 09/15/2023, 10:51 AM

## 2023-09-15 NOTE — [facility] Suicide Risk Assessment (Signed)
 Suicide Risk Assessment  Discharge Assessment    [facility] Discharge Suicide Risk Assessment   Principal Problem: Schizoaffective disorder [facility]) Discharge Diagnoses: Principal Problem:   Schizoaffective disorder (HCC)   Total Time spent with patient: 4 hours  [name] is a 63 y.o. male  with a past psychiatric history of severe schizoaffective disorder, bipolar type. Was hospitalized at [facility] for two months in 2024, lives in [facility] group home, and is under guardianship [name]). Patient initially arrived to [facility] on 8/30 from [facility] for delusional thinking and aggressive behavior. Was admitted to the gero-psych unit at [facility] 8/30 where these behaviors continued: hollering in the hallway, entering other patient's rooms, verbally threatening security staff, attempting to punch an RN. Was then Transferred to [facility] under IVC on 09/09/2023 to the 500-level unit as his behaviors demanded a higher level of care. PMHx is significant for hypertension.   During the patient's hospitalization, patient had extensive initial psychiatric evaluation, and follow-up psychiatric evaluations every day.  Psychiatric diagnoses provided upon initial assessment:   Schizoaffective disorder, bipolar type   Patient's psychiatric medications were adjusted on admission:   - Continue home thorazine  200 mg at bedtime for psychosis. - Stop home Prolixin  5 mg BID and begin Risperdal  1 mg BID to target manic symptomatology with eye toward Invega  Sustenna.   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 Consider alternative PRNs  - Continue benadryl  50 mg BID  During the hospitalization, other adjustments were made to the patient's psychiatric medication regimen:  - Continue home thorazine  200 mg at  bedtime for psychosis. - Continue Risperdal  3 mg BID to target manic symptomatology - Received Invega  Sustenna 234 mg IM 9/6 AM, next dose 156 mg LAI 9/9 AM, maintenance dosing moving forward will be 234 mg once monthly             - IM Benadryl  50 mg once PRN for acute dystonia ordered.  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 - PRNs: Olanzapine  zydis 5 mg TID PRN for mild agitation, IM Olanzapine  5 mg TID PRN for moderate agitation, and IM Olanzapine  10 mg TID PRN for severe agitation. Consider alternative PRNs  - Continue Benadryl  50 mg BID  Patient's care was discussed during the interdisciplinary team meeting every day during the hospitalization.  The patient denied having side effects to prescribed psychiatric medication.  Gradually, patient started adjusting to milieu. The patient was evaluated each day by a clinical provider to ascertain response to treatment. Improvement was noted by the patient's report of decreasing symptoms, improved sleep and appetite, affect, medication tolerance, behavior, and participation in unit programming.  Patient was asked each day to complete a self inventory noting mood, mental status, pain, new symptoms, anxiety and concerns.    Symptoms were reported as significantly decreased or resolved completely by discharge.   On day of discharge, the patient reports that their mood is stable. The patient denied having suicidal thoughts for more than 48 hours prior to discharge.  Patient denies having homicidal thoughts.  Patient denies having auditory hallucinations.  Patient denies any visual hallucinations or other symptoms of psychosis. The patient was motivated to continue taking medication with a goal of continued improvement in  mental health.   The patient reports their target psychiatric symptoms of acute mania responded well to the psychiatric medications, and the patient  reports overall benefit other psychiatric hospitalization. Supportive psychotherapy was provided to the patient. The patient also participated in regular group therapy while hospitalized. Coping skills, problem solving as well as relaxation therapies were also part of the unit programming.  Labs were reviewed with the patient, and abnormal results were discussed with the patient.  The patient is able to verbalize their individual safety plan to this provider.  # It is recommended to the patient to continue psychiatric medications as prescribed, after discharge from the hospital.    # It is recommended to the patient to follow up with your outpatient psychiatric provider and PCP.  # It was discussed with the patient, the impact of alcohol, drugs, tobacco have been there overall psychiatric and medical wellbeing, and total abstinence from substance use was recommended the patient.ed.  # Prescriptions provided or sent directly to preferred pharmacy at discharge. Patient agreeable to plan. Given opportunity to ask questions. Appears to feel comfortable with discharge.    # In the event of worsening symptoms, the patient is instructed to call the crisis hotline, 911 and or go to the nearest ED for appropriate evaluation and treatment of symptoms. To follow-up with primary care provider for other medical issues, concerns and or health care needs  # Patient was discharged home with a plan to follow up as noted below.   On interview, patient was sleeping and eating well. Ready to take his invega  shot and discharge home. No SI or HI. Very pleasant, not intrusive or perseverative on discharge. No acute events overnight. Looking forward to getting back to the group home. FL-2 completed and faxed to guardian yesterday, he is aware of plan to discharge today.    Musculoskeletal: Strength & Muscle Tone: within normal limits Gait & Station: normal Patient leans: N/A  Psychiatric Specialty Exam:    Presentation  General Appearance: Casual   Eye Contact:Fair   Speech:Clear and Coherent (somewhat garbled d/t poor dentition)   Speech Volume:Normal   Handedness:Right     Mood and Affect  Mood:Anxious   Affect:Congruent     Thought Process  Thought Processes:Goal Directed; Irrevelant   Descriptions of Associations:Intact   Orientation:Full (Time, Place and Person)   Thought Content:Scattered; Perseveration   History of Schizophrenia/Schizoaffective disorder:Yes   Duration of Psychotic Symptoms: chronic Hallucinations:Hallucinations: None     Ideas of Reference:None   Suicidal Thoughts:Suicidal Thoughts: No     Homicidal Thoughts:Homicidal Thoughts: No       Sensorium  Memory:Immediate Fair   Judgment:Fair   Insight:Poor     Executive Functions  Concentration:Fair   Attention Span:Fair   Recall:Fair   Fund of Knowledge:Fair   Language:Fair     Psychomotor Activity  Psychomotor Activity: normal, no eps   Assets  Assets:Communication Skills; Desire for Improvement     Sleep  Sleep:Sleep: Fair         Physical Exam: Vitals and nursing note reviewed.  HENT:     Head: Normocephalic and atraumatic.  Pulmonary:     Effort: Pulmonary effort is normal.  Neurological:     General: No focal deficit present.     Mental Status: He is alert.     Comments: No pain, stiffness, cogwheeling on passive ROM of bilateral upper extremities   Psychiatric:     Comments: No obvious EPS.     Review of Systems  Constitutional:  Negative for fever.  Cardiovascular:  Negative for chest pain and palpitations.  Gastrointestinal:  Negative for constipation, diarrhea, nausea and vomiting.  Neurological:  Negative for dizziness, weakness and headaches.  Psychiatric/Behavioral:         Pt denies extrapyramidal symptoms including dystonia (sudden spastic contractions of muscle groups), parkinsonism (bradykinesia, tremors, rigidity), and akathisia (severe  restlessness).  Mental Status Per Nursing Assessment::   On Admission:  NA  Nursing information obtained from:  Patient Demographic factors:  Male, Unemployed Current Mental Status:  NA Loss Factors:  NA Historical Factors:  NA Risk Reduction Factors:  Religious beliefs about death   Continued Clinical Symptoms:  Schizophrenia:   Paranoid or undifferentiated type  Cognitive Features That Contribute To Risk:  Loss of executive function    Suicide Risk:  Minimal: There are no identifiable suicide plans, no associated intent, no dysphoria and related symptoms, good self-control (both objective and subjective assessment), few other risk factors, and identifiable protective factors, including available and accessible social support. Patient appears near-baseline. If he were to re-enter a manic/psychotic state, this risk would rise. However, he will be safely discharged to a group home.   Follow-up Information     [facility] Follow up.   Why: Please continue with your provider [facility], Dr. Emile Dover ,  for therapy services [phone]), at your group home [facility]. Contact information: [address] [place] [address] [phone]                 Plan Of Care/Follow-up recommendations:   -Follow-up with your outpatient psychiatric provider -instructions on appointment date, time, and address (location) are provided to you in discharge paperwork.  -Take your psychiatric medications as prescribed at discharge - instructions are provided to you in the discharge paperwork  -Follow-up with outpatient primary care doctor and other specialists -for management of chronic medical disease, including: hypertension, hyperlipidemia (appears resolved), tobacco use disorder.   -Recommend abstinence from alcohol, tobacco, and other illicit drug use at discharge.   -If your psychiatric symptoms recur, worsen, or if you have side effects to your  psychiatric medications, call your outpatient psychiatric provider, 911, [phone] or go to the nearest emergency department.  -If suicidal thoughts recur, call your outpatient psychiatric provider, 911, [phone] or go to the nearest emergency department.   Kenzi Bardwell, MD 09/15/2023, 7:52 AM

## 2023-09-15 NOTE — Progress Notes (Signed)
 CSW called patient's legal guardian to inform him that patient would be ready for discharge today after 3:30PM, as LG was made aware of discharge yesterday 9/8. LG did not answer, but a voicemail was left. CSW called crisis line for group home [phone] and informed group home supervisor that pt pick-up time would be 3:30pm today. Supervisor stated she would relay message to [place], patient's LG, so transport can be arranged.   Danell Verno, LCSWA 09/15/23 9:41am

## 2023-09-21 ENCOUNTER — Other Ambulatory Visit: Payer: Self-pay

## 2023-09-21 ENCOUNTER — Emergency Department: Admission: EM | Admit: 2023-09-21 | Discharge: 2023-09-21 | Disposition: A | Discharge status: Home / Self Care

## 2023-09-21 DIAGNOSIS — I10 Essential (primary) hypertension: Secondary | ICD-10-CM | POA: Insufficient documentation

## 2023-09-21 DIAGNOSIS — Z760 Encounter for issue of repeat prescription: Secondary | ICD-10-CM | POA: Diagnosis present

## 2023-09-21 MED ORDER — CHLORPROMAZINE HCL 200 MG PO TABS: 200.0000 mg | ORAL_TABLET | Freq: Every day | ORAL | 0 refills | Status: AC

## 2023-09-21 MED ORDER — CLONAZEPAM 1 MG PO TABS: 1.0000 mg | ORAL_TABLET | Freq: Three times a day (TID) | ORAL | 0 refills | Status: DC

## 2023-09-21 MED ORDER — CARBAMAZEPINE ER 200 MG PO TB12: 200.0000 mg | ORAL_TABLET | Freq: Two times a day (BID) | ORAL | 0 refills | Status: DC

## 2023-09-21 MED ORDER — CARBAMAZEPINE ER 200 MG PO TB12: 200.0000 mg | ORAL_TABLET | Freq: Two times a day (BID) | ORAL | 0 refills | Status: AC

## 2023-09-21 MED ORDER — CLONAZEPAM 1 MG PO TABS: 1.0000 mg | ORAL_TABLET | Freq: Three times a day (TID) | ORAL | 0 refills | Status: AC

## 2023-09-21 MED ORDER — RISPERIDONE 3 MG PO TABS: 3.0000 mg | ORAL_TABLET | Freq: Two times a day (BID) | ORAL | 0 refills | Status: DC

## 2023-09-21 MED ORDER — CLONAZEPAM 1 MG PO TABS: 1.0000 mg | ORAL_TABLET | Freq: Two times a day (BID) | ORAL | 0 refills | Status: DC

## 2023-09-21 NOTE — ED Notes (Signed)
 Pt given discharge paperwork. Waiting for [employer] to come pick up pt and return him to his group home. RN attempted to call group home. No answer and mail box was full. Legal guardian notified.

## 2023-09-21 NOTE — ED Notes (Signed)
 Ed provider at bedside

## 2023-09-21 NOTE — ED Notes (Addendum)
 Pt is from the group home, [facility] in [place].  Address is in his chart.   Pt is in need of medication refills.   Pt's legal guardian notified that pt is here at the ER.

## 2023-09-21 NOTE — ED Notes (Signed)
 [employer] picked up pt to give him a ride back to his group home.

## 2023-09-21 NOTE — Discharge Instructions (Addendum)
 I refilled your recent medications.  You are no longer on Risperdal  --your providers are giving you your regular shot instead of this.  Please follow-up with your primary care provider and behavioral health specialist.  Return to the emergency department with any new or worsening symptoms.

## 2023-09-21 NOTE — ED Triage Notes (Signed)
 Patient brought in tonight via [employer]. Patient states he needs a refill on his psychiatric medications. Patient denies SI/HI/AVH to this RN. States he is out of his ativan  and risperidal.

## 2023-09-21 NOTE — ED Provider Notes (Addendum)
 [facility] Provider Note    Event Date/Time   First MD Initiated Contact with Patient 09/21/23 0220     (approximate)   History   Medication Refill  Patient brought in tonight via [employer]. Patient states he needs a refill on his psychiatric medications. Patient denies SI/HI/AVH to this RN. States he is out of his ativan  and risperidal.   HPI [name] is a 64 y.o. male PMH schizophrenia, hypertension presents for request of medication refill - Says he ran out of his Cogentin  and Risperdal  today, requesting refill - Denies SI, HI, hallucinations - Otherwise limited historian but calm and cooperative  Per outpatient records from 09/14/2023, appears she is on Risperdal  3 mg twice daily,   Per chart review, appears patient was just discharged from inpatient hospitalization on 09/15/2022 (6 days ago).  Noted to be on Thorazine  200 mg nightly, appears is now receiving Invega  injections instead of Risperdal , is also on Tegretol  200 mg twice daily, Klonopin  1 mg 3 times daily      Physical Exam   Triage Vital Signs: ED Triage Vitals  Encounter Vitals Group     BP 09/21/23 0146 (!) 184/83     Girls Systolic BP Percentile --      Girls Diastolic BP Percentile --      Boys Systolic BP Percentile --      Boys Diastolic BP Percentile --      Pulse Rate 09/21/23 0146 (!) 113     Resp 09/21/23 0146 20     Temp 09/21/23 0146 98.7 F (37.1 C)     Temp Source 09/21/23 0146 Oral     SpO2 09/21/23 0146 93 %     Weight 09/21/23 0147 138 lb 14.2 oz (63 kg)     Height 09/21/23 0147 5' 7 (1.702 m)     Head Circumference --      Peak Flow --      Pain Score 09/21/23 0146 0     Pain Loc --      Pain Education --      Exclude from Growth Chart --     Most recent vital signs: Vitals:   09/21/23 0146  BP: (!) 184/83  Pulse: (!) 113  Resp: 20  Temp: 98.7 F (37.1 C)  SpO2: 93%     General: Awake, no distress.  CV:  Good  peripheral perfusion. RRR --heart rate 80s at time of my eval, RP 2+ Resp:  Normal effort. CTAB Abd:  No distention. Nontender to deep palpation throughout Psych:  Calm, cooperative, denies SI, HI, hallucinations.  Does not appear to be responding to internal stimuli.   ED Results / Procedures / Treatments   Labs (all labs ordered are listed, but only abnormal results are displayed) Labs Reviewed - No data to display   EKG  N/a   RADIOLOGY N/a    PROCEDURES:  Critical Care performed: No  Procedures   MEDICATIONS ORDERED IN ED: Medications - No data to display   IMPRESSION / MDM / ASSESSMENT AND PLAN / ED COURSE  I reviewed the triage vital signs and the nursing notes.                              DDX/MDM/AP: Differential diagnosis includes, but is not limited to, rest for medication refill --no evidence of psychiatric decompensation at this time.  No physical complaints, well-appearing.  Plan: -  Will refill medications based on his recent discharge summary from 09/15/2023 - Encouraged to follow-up as outpatient  Patient's presentation is most consistent with exacerbation of chronic illness.   ED course below.  Rx 30-day supply of carbamazepine , chlorpromazine .  14-day supply of clonazepam .  Encouraged to follow-up with his primary care provider and behavioral health specialist.      FINAL CLINICAL IMPRESSION(S) / ED DIAGNOSES   Final diagnoses:  Medication refill     Rx / DC Orders   ED Discharge Orders          Ordered    carbamazepine  (TEGRETOL  XR) 200 MG 12 hr tablet  2 times daily,   Status:  Discontinued        09/21/23 0326    risperiDONE  (RISPERDAL ) 3 MG tablet  2 times daily,   Status:  Discontinued        09/21/23 0326    chlorproMAZINE  (THORAZINE ) 200 MG tablet  Daily at bedtime        09/21/23 0326    clonazePAM  (KLONOPIN ) 1 MG tablet  2 times daily,   Status:  Discontinued        09/21/23 0326    clonazePAM  (KLONOPIN ) 1 MG tablet  3  times daily,   Status:  Discontinued        09/21/23 0341    carbamazepine  (TEGRETOL  XR) 200 MG 12 hr tablet  2 times daily        09/21/23 0341    clonazePAM  (KLONOPIN ) 1 MG tablet  3 times daily        09/21/23 0341             Note:  This document was prepared using Dragon voice recognition software and may include unintentional dictation errors.   Clarine Ozell LABOR, MD 09/21/23 9666    Clarine Ozell LABOR, MD 09/21/23 [phone]

## 2023-09-23 ENCOUNTER — Emergency Department
Admission: EM | Admit: 2023-09-23 | Discharge: 2023-09-24 | Disposition: A | Discharge status: Home / Self Care | Attending: Emergency Medicine | Admitting: Emergency Medicine

## 2023-09-23 DIAGNOSIS — T443X6A Underdosing of other parasympatholytics [anticholinergics and antimuscarinics] and spasmolytics, initial encounter: Secondary | ICD-10-CM | POA: Diagnosis not present

## 2023-09-23 DIAGNOSIS — T43596A Underdosing of other antipsychotics and neuroleptics, initial encounter: Secondary | ICD-10-CM | POA: Diagnosis not present

## 2023-09-23 DIAGNOSIS — Z79899 Other long term (current) drug therapy: Secondary | ICD-10-CM | POA: Insufficient documentation

## 2023-09-23 DIAGNOSIS — F250 Schizoaffective disorder, bipolar type: Secondary | ICD-10-CM | POA: Diagnosis present

## 2023-09-23 DIAGNOSIS — Z91128 Patient's intentional underdosing of medication regimen for other reason: Secondary | ICD-10-CM | POA: Insufficient documentation

## 2023-09-23 DIAGNOSIS — E860 Dehydration: Secondary | ICD-10-CM

## 2023-09-23 DIAGNOSIS — R748 Abnormal levels of other serum enzymes: Secondary | ICD-10-CM | POA: Diagnosis not present

## 2023-09-23 DIAGNOSIS — I10 Essential (primary) hypertension: Secondary | ICD-10-CM | POA: Insufficient documentation

## 2023-09-23 DIAGNOSIS — F259 Schizoaffective disorder, unspecified: Secondary | ICD-10-CM | POA: Insufficient documentation

## 2023-09-23 LAB — COMPREHENSIVE METABOLIC PANEL WITH GFR
ALT: 17 U/L (ref 0–44)
AST: 45 U/L — ABNORMAL HIGH (ref 15–41)
Albumin: 4 g/dL (ref 3.5–5.0)
Alkaline Phosphatase: 117 U/L (ref 38–126)
Anion gap: 17 — ABNORMAL HIGH (ref 5–15)
BUN: 11 mg/dL (ref 8–23)
CO2: 15 mmol/L — ABNORMAL LOW (ref 22–32)
Calcium: 8.8 mg/dL — ABNORMAL LOW (ref 8.9–10.3)
Chloride: 104 mmol/L (ref 98–111)
Creatinine, Ser: 1.08 mg/dL (ref 0.61–1.24)
GFR, Estimated: >60 mL/min (ref >60)
Glucose, Bld: 138 mg/dL — ABNORMAL HIGH (ref 70–99)
Potassium: 3.7 mmol/L (ref 3.5–5.1)
Sodium: 136 mmol/L (ref 135–145)
Total Bilirubin: 1.2 mg/dL (ref 0.0–1.2)
Total Protein: 8.2 g/dL — ABNORMAL HIGH (ref 6.5–8.1)

## 2023-09-23 LAB — BLOOD GAS, VENOUS
Acid-base deficit: 1.3 mmol/L (ref 0.0–2.0)
Bicarbonate: 22.7 mmol/L (ref 20.0–28.0)
O2 Saturation: 91.1 %
Patient temperature: 37
pCO2, Ven: 35 mmHg — ABNORMAL LOW (ref 44–60)
pH, Ven: 7.42 (ref 7.25–7.43)
pO2, Ven: 56 mmHg — ABNORMAL HIGH (ref 32–45)

## 2023-09-23 LAB — URINE DRUG SCREEN, QUALITATIVE (ARMC ONLY)
Amphetamines, Ur Screen: NOT DETECTED
Barbiturates, Ur Screen: NOT DETECTED
Benzodiazepine, Ur Scrn: NOT DETECTED
Cannabinoid 50 Ng, Ur ~~LOC~~: NOT DETECTED
Cocaine Metabolite,Ur ~~LOC~~: NOT DETECTED
MDMA (Ecstasy)Ur Screen: NOT DETECTED
Methadone Scn, Ur: NOT DETECTED
Opiate, Ur Screen: NOT DETECTED
Phencyclidine (PCP) Ur S: NOT DETECTED
Tricyclic, Ur Screen: POSITIVE — AB

## 2023-09-23 LAB — LACTIC ACID, PLASMA: Lactic Acid, Venous: 1.2 mmol/L (ref 0.5–1.9)

## 2023-09-23 LAB — ETHANOL: Alcohol, Ethyl (B): <15 mg/dL (ref <15)

## 2023-09-23 LAB — CBC
HCT: 38.5 % — ABNORMAL LOW (ref 39.0–52.0)
Hemoglobin: 11.8 g/dL — ABNORMAL LOW (ref 13.0–17.0)
MCH: 31.8 pg (ref 26.0–34.0)
MCHC: 30.6 g/dL (ref 30.0–36.0)
MCV: 103.8 fL — ABNORMAL HIGH (ref 80.0–100.0)
Platelets: 351 K/uL (ref 150–400)
RBC: 3.71 MIL/uL — ABNORMAL LOW (ref 4.22–5.81)
RDW: 14.2 % (ref 11.5–15.5)
WBC: 8 K/uL (ref 4.0–10.5)
nRBC: 0 % (ref 0.0–0.2)

## 2023-09-23 LAB — CK: Total CK: 662 U/L — ABNORMAL HIGH (ref 49–397)

## 2023-09-23 MED ORDER — METOPROLOL TARTRATE 50 MG PO TABS
25.0000 mg | ORAL_TABLET | Freq: Two times a day (BID) | ORAL | Status: DC
  Administered 2023-09-24: 25 mg via ORAL
  Filled 2023-09-23: qty 1

## 2023-09-23 MED ORDER — AMLODIPINE BESYLATE 5 MG PO TABS
10.0000 mg | ORAL_TABLET | Freq: Every day | ORAL | Status: DC
  Administered 2023-09-23 – 2023-09-24 (×2): 10 mg via ORAL
  Filled 2023-09-23 (×2): qty 2

## 2023-09-23 MED ORDER — HYDROXYZINE HCL 25 MG PO TABS
25.0000 mg | ORAL_TABLET | Freq: Three times a day (TID) | ORAL | Status: DC | PRN
  Administered 2023-09-24: 25 mg via ORAL
  Filled 2023-09-23: qty 1

## 2023-09-23 MED ORDER — CARBAMAZEPINE ER 200 MG PO TB12
200.0000 mg | ORAL_TABLET | Freq: Two times a day (BID) | ORAL | Status: DC
  Administered 2023-09-24: 200 mg via ORAL
  Filled 2023-09-23 (×2): qty 1

## 2023-09-23 MED ORDER — CLONAZEPAM 0.5 MG PO TABS
2.0000 mg | ORAL_TABLET | ORAL | Status: AC
  Administered 2023-09-23: 2 mg via ORAL
  Filled 2023-09-23: qty 4

## 2023-09-23 MED ORDER — LOSARTAN POTASSIUM 50 MG PO TABS
100.0000 mg | ORAL_TABLET | Freq: Every day | ORAL | Status: DC
  Administered 2023-09-23 – 2023-09-24 (×2): 100 mg via ORAL
  Filled 2023-09-23 (×2): qty 2

## 2023-09-23 MED ORDER — ATORVASTATIN CALCIUM 20 MG PO TABS: 40.0000 mg | ORAL_TABLET | Freq: Every day | ORAL | Status: DC

## 2023-09-23 MED ORDER — CHLORPROMAZINE HCL 100 MG PO TABS
200.0000 mg | ORAL_TABLET | Freq: Every day | ORAL | Status: DC
  Filled 2023-09-23: qty 2

## 2023-09-23 MED ORDER — ZIPRASIDONE MESYLATE 20 MG IM SOLR
20.0000 mg | Freq: Once | INTRAMUSCULAR | Status: AC
Start: 2023-09-23 — End: 2023-09-23
  Administered 2023-09-23: 20 mg via INTRAMUSCULAR
  Filled 2023-09-23: qty 20

## 2023-09-23 MED ORDER — TRAZODONE HCL 50 MG PO TABS: 50.0000 mg | ORAL_TABLET | Freq: Every evening | ORAL | Status: DC | PRN

## 2023-09-23 NOTE — ED Notes (Addendum)
 Pt sitting in corner of room in the floor. Pt talking very loudly and not making sense when he talks. Pt seen taking cups and filling them up with water  in the bathroom sink. Cups taken from pt.

## 2023-09-23 NOTE — ED Notes (Signed)
 Pt standing at doorway requesting food.   Pt rambling.

## 2023-09-23 NOTE — ED Triage Notes (Signed)
 Pt accompanied by PD for voluntary commitment. Pt has hx of schizophrenia and lives in a group home. Pt rambling with rapid speech that is incongruent. Pt says the police is with him because he refused to go to boot [employer] and he was taking naked pictures.

## 2023-09-23 NOTE — ED Provider Notes (Signed)
 [facility] Provider Note    Event Date/Time   First MD Initiated Contact with Patient 09/23/23 1841     (approximate)   History   Psychiatric Evaluation   HPI  [name] is a 63 y.o. male history of schizophrenia and hypertension  Patient tells me he comes today because he has run out of his medicine.  He came to the doctor a couple days ago here at the ER they gave him his medicine but he was not able to get it filled.  He has not been on medicine now for a few days  He reports he has been feeling a bit like he needed to wrestle somebody.  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  He does not want to hurt himself or anyone else.  He reports he feels fine has not been sick he would like something to eat.  He would like to get back on his medication reports that he needs all of his medicine  Was seen in the ER 3 days ago for running out of Cogentin  and Risperdal      Physical Exam   Triage Vital Signs: ED Triage Vitals  Encounter Vitals Group     BP 09/23/23 1834 (!) 165/97     Girls Systolic BP Percentile --      Girls Diastolic BP Percentile --      Boys Systolic BP Percentile --      Boys Diastolic BP Percentile --      Pulse Rate 09/23/23 1834 (!) 120     Resp 09/23/23 1834 20     Temp 09/23/23 1834 98.1 F (36.7 C)     Temp Source 09/23/23 1834 Oral     SpO2 09/23/23 1834 96 %     Weight --      Height --      Head Circumference --      Peak Flow --      Pain Score 09/23/23 1830 0     Pain Loc --      Pain Education --      Exclude from Growth Chart --     Most recent vital signs: Vitals:   09/23/23 1834 09/23/23 2010  BP: (!) 165/97 (!) 164/98  Pulse: (!) 120 (!) 106  Resp: 20 20  Temp: 98.1 F (36.7 C) 98.2 F (36.8 C)  SpO2: 96% 96%     General: Awake, no distress.  Ambulatory.  Not acutely tangential, he is pleasant but his  mood does seem somewhat elevated.  Reports that he does not want to hurt himself or anyone else. CV:  Good peripheral perfusion.  Resp:  Normal effort.  Abd:  No distention.  Other:  Ambulates with stable gait.  Pleasant, compliant with care request.  Would like his medications.  Agreeable to seeing a psychiatrist   ED Results / Procedures / Treatments   Labs (all labs ordered are listed, but only abnormal results are displayed) Labs Reviewed  COMPREHENSIVE METABOLIC PANEL WITH GFR - Abnormal; Notable for the following components:      Result Value   CO2 15 (*)    Glucose, Bld 138 (*)    Calcium  8.8 (*)    Total Protein 8.2 (*)    AST 45 (*)    Anion gap 17 (*)    All other components within normal limits  CBC - Abnormal; Notable for the following components:   RBC 3.71 (*)    Hemoglobin 11.8 (*)    HCT 38.5 (*)    MCV 103.8 (*)    All other components within normal limits  URINE DRUG SCREEN, QUALITATIVE ([facility] ONLY) - Abnormal; Notable for the following components:   Tricyclic, Ur Screen POSITIVE (*)    All other components within normal limits  CK - Abnormal; Notable for the following components:   Total CK 662 (*)    All other components within normal limits  BLOOD GAS, VENOUS - Abnormal; Notable for the following components:   pCO2, Ven 35 (*)    pO2, Ven 56 (*)    All other components within normal limits  ETHANOL  LACTIC ACID, PLASMA  BASIC METABOLIC PANEL WITH GFR  CK     EKG     RADIOLOGY     PROCEDURES:  Critical Care performed: No  Procedures   MEDICATIONS ORDERED IN ED: Medications  amLODipine  (NORVASC ) tablet 10 mg (10 mg Oral Given 09/23/23 1956)  atorvastatin  (LIPITOR) tablet 40 mg (40 mg Oral Not Given 09/23/23 2339)  losartan  (COZAAR ) tablet 100 mg (100 mg Oral Given 09/23/23 1956)  metoprolol  tartrate (LOPRESSOR ) tablet 25 mg (25 mg Oral Not Given 09/23/23 2338)  carbamazepine  (TEGRETOL  XR) 12 hr tablet 200 mg (200 mg Oral Not Given  09/23/23 2338)  hydrOXYzine  (ATARAX ) tablet 25 mg (has no administration in time range)  traZODone  (DESYREL ) tablet 50 mg (has no administration in time range)  clonazePAM  (KLONOPIN ) tablet 2 mg (2 mg Oral Given 09/23/23 1956)  ziprasidone  (GEODON ) injection 20 mg (20 mg Intramuscular Given 09/23/23 1956)     IMPRESSION / MDM / ASSESSMENT AND PLAN / ED COURSE  I reviewed the triage vital signs and the nursing notes.                              Differential diagnosis includes, but is not limited to, acute exacerbation of schizophrenia possibly related to not having access to his medications, psychosis, etc.  Appears to be consistent with a history of previous schizoaffective disorder.  Has established history.  If he has not been taking his medication then I suspect is likely exacerbation.  He is pleasant with care request, requesting something to eat and does seem to have somewhat pressured speech but not exhibiting any symptoms that would make me think that he is at acute risk of harming himself or anyone else.  I think he is appropriate to remain voluntary at this time it is noted he does have a legal guardian.  Given the history, will defer to psychiatry to determine if patient needs psychiatric hospitalization and further recommendations.  Will treat his hypertension here by reinitiating his medications  I suspect if the patient attempts to elope then he would need to be placed under involuntary commitment as he has a guardian and I suspect has element of acute psychosis but at this point does not appear to be severe risk to himself or others    Patient's presentation is most consistent with acute complicated illness / injury requiring diagnostic workup.   The patient is on the cardiac monitor to evaluate for evidence of arrhythmia and/or significant heart rate changes.  Ongoing care assigned to Dr. Nicholaus.  Patient still undergoing medical screening evaluation, pending psychiatry consult.   Very mild elevation of CK and slightly elevated anion gap with reduced CO2  on presentation.  Resting comfortably now without distress.  Plan to recheck CK and metabolic panel in the morning.   Psych consult pending as well as additional medical clearance labs (AM draw)  FINAL CLINICAL IMPRESSION(S) / ED DIAGNOSES   Final diagnoses:  Schizoaffective disorder, unspecified type (HCC)  Elevated CK     Rx / DC Orders   ED Discharge Orders     None        Note:  This document was prepared using Dragon voice recognition software and may include unintentional dictation errors.   Dicky Anes, MD 09/23/23 [phone]

## 2023-09-23 NOTE — ED Notes (Signed)
 Pt given snack.

## 2023-09-23 NOTE — BH Assessment (Addendum)
 Comprehensive Clinical Assessment (CCA) Note  09/23/2023 [name] [id number]  Chief Complaint: Patient is a 63 year old male presenting to [facility] ED voluntarily. Per triage note Pt accompanied by PD for voluntary commitment. Pt has hx of schizophrenia and lives in a group home. Pt rambling with rapid speech that is incongruent. Pt says the police is with him because he refused to go to boot camp and he was taking naked pictures. During assessment patient appears alert and oriented x4, calm and cooperative. Patient reports I have a phobia, a fear like somebody is gonna attack me and zap me. Patient reports feeling paranoid for a long time. Patient was just recently hospitalized with [facility] and was discharged on 09/15/23. Patient reports since discharged he has taken his medications and reports that his sleep and appetite are good. Patient denies SI/HI/AH/VH  Patient to be reassessed Chief Complaint  Patient presents with   Psychiatric Evaluation   Visit Diagnosis: Schizoaffective disorder    CCA Screening, Triage and Referral (STR)  Patient Reported Information How did you hear about us ? Other (Comment)  Referral name: No data recorded Referral phone number: No data recorded  Whom do you see for routine medical problems? No data recorded Practice/Facility Name: No data recorded Practice/Facility Phone Number: No data recorded Name of Contact: No data recorded Contact Number: No data recorded Contact Fax Number: No data recorded Prescriber Name: No data recorded Prescriber Address (if known): No data recorded  What Is the Reason for Your Visit/Call Today? Pt accompanied by PD for voluntary commitment. Pt has hx of schizophrenia and lives in a group home. Pt rambling with rapid speech that is incongruent. Pt says the police is with him because he refused to go to boot camp and he was taking naked pictures  How Long Has This Been Causing You Problems? > than 6 months  What  Do You Feel Would Help You the Most Today? Treatment for Depression or other mood problem   Have You Recently Been in Any Inpatient Treatment (Hospital/Detox/Crisis Center/28-Day Program)? No data recorded Name/Location of Program/Hospital:No data recorded How Long Were You There? No data recorded When Were You Discharged? No data recorded  Have You Ever Received Services From [facility] Before? No data recorded Who Do You See at [facility]? No data recorded  Have You Recently Had Any Thoughts About Hurting Yourself? No  Are You Planning to Commit Suicide/Harm Yourself At This time? No   Have you Recently Had Thoughts About Hurting Someone [place]? No  Explanation: No data recorded  Have You Used Any Alcohol or Drugs in the Past 24 Hours? No  How Long Ago Did You Use Drugs or Alcohol? No data recorded What Did You Use and How Much? No data recorded  Do You Currently Have a Therapist/Psychiatrist? Yes  Name of Therapist/Psychiatrist: Via the care facility   Have You Been Recently Discharged From Any Office Practice or Programs? No  Explanation of Discharge From Practice/Program: No data recorded    CCA Screening Triage Referral Assessment Type of Contact: Face-to-Face  Is this Initial or Reassessment? No data recorded Date Telepsych consult ordered in [facility]:  No data recorded Time Telepsych consult ordered in [facility]:  No data recorded  Patient Reported Information Reviewed? No data recorded Patient Left Without Being Seen? No data recorded Reason for Not Completing Assessment: No data recorded  Collateral Involvement: No data recorded  Does Patient Have a Court Appointed Legal Guardian? No data recorded Name and  Contact of Legal Guardian: No data recorded If Minor and Not Living with Parent(s), Who has Custody? No data recorded Is CPS involved or ever been involved? Never  Is APS involved or ever been involved? Never   Patient Determined To Be At Risk for Harm To Self  or Others Based on Review of Patient Reported Information or Presenting Complaint? No  Method: No data recorded Availability of Means: No data recorded Intent: No data recorded Notification Required: No data recorded Additional Information for Danger to Others Potential: No data recorded Additional Comments for Danger to Others Potential: No data recorded Are There Guns or Other Weapons in Your Home? No  Types of Guns/Weapons: No data recorded Are These Weapons Safely Secured?                            No  Who Could Verify You Are Able To Have These Secured: No data recorded Do You Have any Outstanding Charges, Pending Court Dates, Parole/Probation? No data recorded Contacted To Inform of Risk of Harm To Self or Others: No data recorded  Location of Assessment: [facility] ED   Does Patient Present under Involuntary Commitment? No  IVC Papers Initial File Date: No data recorded  [place] of Residence: [place]   Patient Currently Receiving the Following Services: Medication Management; Group Home   Determination of Need: Emergent (2 hours)   Options For Referral: ED Visit; Inpatient Hospitalization     CCA Biopsychosocial Intake/Chief Complaint:  No data recorded Current Symptoms/Problems: No data recorded  Patient Reported Schizophrenia/Schizoaffective Diagnosis in Past: Yes   Strengths: Have stable housing, able to perform his ADL's independently and has outpatient provider.  Preferences: No data recorded Abilities: No data recorded  Type of Services Patient Feels are Needed: No data recorded  Initial Clinical Notes/Concerns: No data recorded  Mental Health Symptoms Depression:  Change in energy/activity; Difficulty Concentrating   Duration of Depressive symptoms: Greater than two weeks   Mania:  Change in energy/activity; Increased Energy; Irritability   Anxiety:   Difficulty concentrating; Irritability; Restlessness   Psychosis:  Delusions   Duration of  Psychotic symptoms: Greater than six months   Trauma:  N/A   Obsessions:  N/A   Compulsions:  N/A   Inattention:  N/A   Hyperactivity/Impulsivity:  N/A   Oppositional/Defiant Behaviors:  N/A   Emotional Irregularity:  N/A   Other Mood/Personality Symptoms:  No data recorded   Mental Status Exam Appearance and self-care  Stature:  Average   Weight:  Average weight   Clothing:  Casual   Grooming:  Normal   Cosmetic use:  None   Posture/gait:  Normal   Motor activity:  Not Remarkable (Within normal range)   Sensorium  Attention:  Distractible   Concentration:  Anxiety interferes; Focuses on irrelevancies; Scattered   Orientation:  X5   Recall/memory:  Normal   Affect and Mood  Affect:  Anxious; Full Range   Mood:  Anxious   Relating  Eye contact:  Normal   Facial expression:  Anxious; Responsive   Attitude toward examiner:  Cooperative   Thought and Language  Speech flow: Articulation error; Flight of Ideas   Thought content:  Appropriate to Mood and Circumstances   Preoccupation:  None   Hallucinations:  None (Patient denies)   Organization:  No data recorded  [employer] of Knowledge:  Average   Intelligence:  Average   Abstraction:  Functional   Judgement:  Fair   Reality Testing:  Distorted   Insight:  Fair   Decision Making:  Impulsive   Social Functioning  Social Maturity:  Impulsive   Social Judgement:  Heedless; Impropriety; Naive   Stress  Stressors:  Other (Comment)   Coping Ability:  Overwhelmed   Skill Deficits:  None   Supports:  Friends/Service system     Religion: Religion/Spirituality Are You A Religious Person?: No  Leisure/Recreation: Leisure / Recreation Do You Have Hobbies?: No  Exercise/Diet: Exercise/Diet Do You Exercise?: No Have You Gained or Lost A Significant Amount of Weight in the Past Six Months?: No Do You Follow a Special Diet?: No Do You Have Any Trouble Sleeping?:  No   CCA Employment/Education Employment/Work Situation: Employment / Work Situation Employment Situation: On disability Why is Patient on Disability: I worked but I became sick.  I had 5 jobs.  I had a bad reaction to medications. How Long has Patient Been on Disability: about 10 - 15 years Patient's Job has Been Impacted by Current Illness: No Has Patient ever Been in the [employer]?: No  Education: Education Is Patient Currently Attending School?: No Did You Have An Individualized Education Program (IIEP): No Did You Have Any Difficulty At School?: No Patient's Education Has Been Impacted by Current Illness: No   CCA Family/Childhood History Family and Relationship History: Family history Marital status: Single Does patient have children?: No  Childhood History:  Childhood History Did patient suffer any verbal/emotional/physical/sexual abuse as a child?: No Did patient suffer from severe childhood neglect?: No Has patient ever been sexually abused/assaulted/raped as an adolescent or adult?: No Was the patient ever a victim of a crime or a disaster?: No Witnessed domestic violence?: No Has patient been affected by domestic violence as an adult?: No  Child/Adolescent Assessment:     CCA Substance Use Alcohol/Drug Use: Alcohol / Drug Use Pain Medications: See MAR Prescriptions: See MAR Over the Counter: See MAR History of alcohol / drug use?: No history of alcohol / drug abuse Longest period of sobriety (when/how long): n/a                         ASAM's:  Six Dimensions of Multidimensional Assessment  Dimension 1:  Acute Intoxication and/or Withdrawal Potential:      Dimension 2:  Biomedical Conditions and Complications:      Dimension 3:  Emotional, Behavioral, or Cognitive Conditions and Complications:     Dimension 4:  Readiness to Change:     Dimension 5:  Relapse, Continued use, or Continued Problem Potential:     Dimension 6:   Recovery/Living Environment:     ASAM Severity Score:    ASAM Recommended Level of Treatment:     Substance use Disorder (SUD)    Recommendations for Services/Supports/Treatments:    DSM5 Diagnoses: Patient Active Problem List   Diagnosis Date Noted   Schizoaffective disorder (HCC) 09/08/2023   Psychosis (HCC) 09/05/2023   Schizoaffective disorder, bipolar type (HCC) 06/15/2016   Hypertension 06/13/2016   Tobacco use disorder 10/04/2015   Noncompliance 10/03/2015    Patient Centered Plan: Patient is on the following Treatment Plan(s):  Impulse Control   Referrals to Alternative Service(s): Referred to Alternative Service(s):   Place:   Date:   Time:    Referred to Alternative Service(s):   Place:   Date:   Time:    Referred to Alternative Service(s):   Place:   Date:   Time:  Referred to Alternative Service(s):   Place:   Date:   Time:      @[email or web address] @  [employer], LCAS-A

## 2023-09-24 DIAGNOSIS — F259 Schizoaffective disorder, unspecified: Secondary | ICD-10-CM | POA: Diagnosis not present

## 2023-09-24 LAB — BASIC METABOLIC PANEL WITH GFR
Anion gap: 12 (ref 5–15)
BUN: 7 mg/dL — ABNORMAL LOW (ref 8–23)
CO2: 23 mmol/L (ref 22–32)
Calcium: 8.9 mg/dL (ref 8.9–10.3)
Chloride: 108 mmol/L (ref 98–111)
Creatinine, Ser: 0.76 mg/dL (ref 0.61–1.24)
GFR, Estimated: >60 mL/min (ref >60)
Glucose, Bld: 95 mg/dL (ref 70–99)
Potassium: 3.4 mmol/L — ABNORMAL LOW (ref 3.5–5.1)
Sodium: 141 mmol/L (ref 135–145)

## 2023-09-24 LAB — CK: Total CK: 622 U/L — ABNORMAL HIGH (ref 49–397)

## 2023-09-24 MED ORDER — LORAZEPAM 1 MG PO TABS
1.0000 mg | ORAL_TABLET | Freq: Once | ORAL | Status: AC
  Administered 2023-09-24: 1 mg via ORAL
  Filled 2023-09-24: qty 1

## 2023-09-24 MED ORDER — ZIPRASIDONE HCL 20 MG PO CAPS
20.0000 mg | ORAL_CAPSULE | Freq: Once | ORAL | Status: AC
  Administered 2023-09-24: 20 mg via ORAL
  Filled 2023-09-24: qty 1
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
## 2023-09-24 NOTE — ED Notes (Signed)
 Patient declined afternoon snack.

## 2023-09-24 NOTE — ED Notes (Signed)
 Pt given shower supplies. COMPLETED

## 2023-09-24 NOTE — ED Notes (Signed)
 Pt was given a cup of iced water .

## 2023-09-24 NOTE — ED Notes (Signed)
 Assumed care of patient, pt sitting on bed, up ad lib frequently, no signs of distress noted

## 2023-09-24 NOTE — ED Notes (Signed)
 Patient now requesting a snack. Patient given apples with peanut butter and an ice water .

## 2023-09-24 NOTE — ED Notes (Signed)
Pt breakfast provided

## 2023-09-24 NOTE — Progress Notes (Signed)
 Per group home, patient has already been given notice for eviction. It is planned that he will be moving homes next week. Group home aware that patient will return to their facility until legal guardian arranges other living arrangements. Group home reported patient has lived there for less than a year, and had been recently slamming doors and wandering into other people's room.   Full consult note to follow. Patient is well known to emergency department and previously admitted to inpatient unit on 09/08/2023. On my assessment, he presents consistent with his baseline. Patient denied SI/HI. He reported no new voices that aren't always there when asked about auditory and visual hallucinations. Patient compliant with medications while in the ED. PRN oral medications were given while in ED due to patient becoming anxious about waiting to leave. At this time, patient does not meet criteria for IVC. His current presentation of chronic paranoia and hallucinations are consistent with his diagnosis of schizoaffective disorder. Per chart review and nursing report, patient has not been physically aggressive or aggressive with staff. On current presentation, he does not appear to pose an imminent threat to self or others at this time. At this time, we will psych clear patient to return to group home and continue to follow up with outpatient providers/services.

## 2023-09-24 NOTE — ED Notes (Signed)
 This RN assumed care of patient. Patient placed in room. Patient denies other needs at this time. Patient currently calm and cooperative.

## 2023-09-24 NOTE — ED Notes (Signed)
 VOL moved to [facility] 5  pending psych

## 2023-09-24 NOTE — ED Provider Notes (Signed)
 Procedures     ----------------------------------------- 7:59 AM on 09/24/2023 ----------------------------------------- Repeat CK downtrending. Repeat BMP stable, Cr normal. Medically clear to proceed with psych disposition. Continue PO fluids     Viviann Pastor, MD 09/24/23 [phone]
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
## 2023-09-24 NOTE — ED Provider Notes (Addendum)
 Emergency Medicine Observation Re-evaluation Note  CLETO CLAGGETT is a 64 y.o. male, seen on rounds today.  Pt initially presented to the ED for complaints of Psychiatric Evaluation Currently, the patient is sleeping comfortably  Physical Exam  BP (!) 164/98   Pulse (!) 106   Temp 98.2 F (36.8 C) (Oral)   Resp 20   SpO2 96%  Physical Exam General: NAD Cardiac: well perfused Lungs: No respiratory distress   ED Course / MDM  EKG:   I have reviewed the labs performed to date as well as medications administered while in observation.  Recent changes in the last 24 hours include Seen by psych, does not recommend inpatient observation at this time    Plan  Current plan is for Overnight observation and reassessment per psych; repeat labs at 6:00 am    Nicholaus Rolland BRAVO, MD 09/24/23 0411    Nicholaus Rolland BRAVO, MD 09/24/23 [phone]

## 2023-09-26 ENCOUNTER — Encounter: Payer: Self-pay | Admitting: *Deleted

## 2023-09-26 ENCOUNTER — Emergency Department
Admission: EM | Admit: 2023-09-26 | Discharge: 2023-09-29 | Disposition: A | Discharge status: Other Acute Inpatient Hospital | Attending: Emergency Medicine | Admitting: Emergency Medicine

## 2023-09-26 DIAGNOSIS — F259 Schizoaffective disorder, unspecified: Secondary | ICD-10-CM | POA: Diagnosis not present

## 2023-09-26 DIAGNOSIS — Z79899 Other long term (current) drug therapy: Secondary | ICD-10-CM | POA: Diagnosis not present

## 2023-09-26 DIAGNOSIS — R456 Violent behavior: Secondary | ICD-10-CM | POA: Diagnosis not present

## 2023-09-26 DIAGNOSIS — R4689 Other symptoms and signs involving appearance and behavior: Secondary | ICD-10-CM

## 2023-09-26 DIAGNOSIS — R462 Strange and inexplicable behavior: Secondary | ICD-10-CM | POA: Diagnosis present

## 2023-09-26 LAB — CBC
HCT: 37.3 % — ABNORMAL LOW (ref 39.0–52.0)
Hemoglobin: 11.6 g/dL — ABNORMAL LOW (ref 13.0–17.0)
MCH: 32.2 pg (ref 26.0–34.0)
MCHC: 31.1 g/dL (ref 30.0–36.0)
MCV: 103.6 fL — ABNORMAL HIGH (ref 80.0–100.0)
Platelets: 402 K/uL — ABNORMAL HIGH (ref 150–400)
RBC: 3.6 MIL/uL — ABNORMAL LOW (ref 4.22–5.81)
RDW: 14.5 % (ref 11.5–15.5)
WBC: 8.5 K/uL (ref 4.0–10.5)
nRBC: 0 % (ref 0.0–0.2)

## 2023-09-26 LAB — ETHANOL: Alcohol, Ethyl (B): <15 mg/dL (ref <15)

## 2023-09-26 NOTE — ED Notes (Signed)
 Pt arrived to the [facility] by myself, cleared by security officer Fairy.

## 2023-09-26 NOTE — ED Notes (Signed)
 Pt given safety sandwich tray and drink per his request.
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
## 2023-09-27 DIAGNOSIS — R456 Violent behavior: Secondary | ICD-10-CM | POA: Diagnosis not present

## 2023-09-27 LAB — COMPREHENSIVE METABOLIC PANEL WITH GFR
ALT: 20 U/L (ref 0–44)
AST: 28 U/L (ref 15–41)
Albumin: 3.7 g/dL (ref 3.5–5.0)
Alkaline Phosphatase: 104 U/L (ref 38–126)
Anion gap: 11 (ref 5–15)
BUN: 12 mg/dL (ref 8–23)
CO2: 26 mmol/L (ref 22–32)
Calcium: 9.2 mg/dL (ref 8.9–10.3)
Chloride: 109 mmol/L (ref 98–111)
Creatinine, Ser: 0.99 mg/dL (ref 0.61–1.24)
GFR, Estimated: >60 mL/min (ref >60)
Glucose, Bld: 111 mg/dL — ABNORMAL HIGH (ref 70–99)
Potassium: 4.1 mmol/L (ref 3.5–5.1)
Sodium: 146 mmol/L — ABNORMAL HIGH (ref 135–145)
Total Bilirubin: 0.4 mg/dL (ref 0.0–1.2)
Total Protein: 7.5 g/dL (ref 6.5–8.1)

## 2023-09-27 LAB — URINE DRUG SCREEN, QUALITATIVE (ARMC ONLY)
Amphetamines, Ur Screen: NOT DETECTED
Barbiturates, Ur Screen: NOT DETECTED
Benzodiazepine, Ur Scrn: NOT DETECTED
Cannabinoid 50 Ng, Ur ~~LOC~~: NOT DETECTED
Cocaine Metabolite,Ur ~~LOC~~: NOT DETECTED
MDMA (Ecstasy)Ur Screen: NOT DETECTED
Methadone Scn, Ur: NOT DETECTED
Opiate, Ur Screen: NOT DETECTED
Phencyclidine (PCP) Ur S: NOT DETECTED
Tricyclic, Ur Screen: POSITIVE — AB

## 2023-09-27 LAB — CARBAMAZEPINE LEVEL, TOTAL: Carbamazepine Lvl: 3.5 ug/mL — ABNORMAL LOW (ref 4.0–12.0)

## 2023-09-27 MED ORDER — TRAZODONE HCL 50 MG PO TABS
50.0000 mg | ORAL_TABLET | Freq: Every evening | ORAL | Status: DC | PRN
  Administered 2023-09-27: 50 mg via ORAL
  Filled 2023-09-27: qty 1

## 2023-09-27 MED ORDER — ATORVASTATIN CALCIUM 20 MG PO TABS
40.0000 mg | ORAL_TABLET | Freq: Every day | ORAL | Status: DC
  Administered 2023-09-27 – 2023-09-28 (×2): 40 mg via ORAL
  Filled 2023-09-27 (×2): qty 2

## 2023-09-27 MED ORDER — AMLODIPINE BESYLATE 5 MG PO TABS
10.0000 mg | ORAL_TABLET | Freq: Every day | ORAL | Status: DC
  Administered 2023-09-27 – 2023-09-29 (×3): 10 mg via ORAL
  Filled 2023-09-27 (×3): qty 2

## 2023-09-27 MED ORDER — CLONAZEPAM 1 MG PO TABS: 1.0000 mg | ORAL_TABLET | Freq: Three times a day (TID) | ORAL | Status: DC

## 2023-09-27 MED ORDER — LOSARTAN POTASSIUM 50 MG PO TABS
100.0000 mg | ORAL_TABLET | Freq: Every day | ORAL | Status: DC
  Administered 2023-09-27 – 2023-09-29 (×3): 100 mg via ORAL
  Filled 2023-09-27 (×3): qty 2

## 2023-09-27 MED ORDER — CHLORPROMAZINE HCL 50 MG PO TABS: 200.0000 mg | ORAL_TABLET | Freq: Every day | ORAL | Status: DC

## 2023-09-27 MED ORDER — CLONAZEPAM 1 MG PO TABS
1.0000 mg | ORAL_TABLET | Freq: Three times a day (TID) | ORAL | Status: DC
  Administered 2023-09-27 – 2023-09-29 (×6): 1 mg via ORAL
  Filled 2023-09-27 (×6): qty 1

## 2023-09-27 MED ORDER — CHLORPROMAZINE HCL 50 MG PO TABS
200.0000 mg | ORAL_TABLET | Freq: Every day | ORAL | Status: DC
  Administered 2023-09-27: 200 mg via ORAL
  Filled 2023-09-27: qty 4

## 2023-09-27 MED ORDER — METOPROLOL TARTRATE 50 MG PO TABS
25.0000 mg | ORAL_TABLET | Freq: Two times a day (BID) | ORAL | Status: DC
  Administered 2023-09-27 – 2023-09-29 (×5): 25 mg via ORAL
  Filled 2023-09-27 (×5): qty 0.5

## 2023-09-27 MED ORDER — HYDROXYZINE HCL 25 MG PO TABS
25.0000 mg | ORAL_TABLET | Freq: Three times a day (TID) | ORAL | Status: DC | PRN
  Administered 2023-09-27 – 2023-09-29 (×2): 25 mg via ORAL
  Filled 2023-09-27 (×2): qty 1

## 2023-09-27 MED ORDER — CARBAMAZEPINE ER 200 MG PO TB12: 200.0000 mg | ORAL_TABLET | Freq: Two times a day (BID) | ORAL | Status: DC

## 2023-09-27 MED ORDER — ZIPRASIDONE MESYLATE 20 MG IM SOLR
20.0000 mg | Freq: Once | INTRAMUSCULAR | Status: AC
  Administered 2023-09-27: 20 mg via INTRAMUSCULAR
  Filled 2023-09-27: qty 20

## 2023-09-27 MED ORDER — CARBAMAZEPINE ER 200 MG PO TB12
200.0000 mg | ORAL_TABLET | Freq: Two times a day (BID) | ORAL | Status: DC
  Administered 2023-09-27 – 2023-09-29 (×5): 200 mg via ORAL
  Filled 2023-09-27 (×5): qty 1

## 2023-09-27 NOTE — ED Notes (Signed)
 Patient given warm blankets.
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
## 2023-09-27 NOTE — ED Notes (Addendum)
 Pt standing outside room stating that he would like to leave.  This RN informed pt that he was waiting on the doctor to decide if he will discharge.  Pt became irate, loud, verbally aggressive, slammed door to inside of room hitting wall, in room yelling loudly.  EDP Gordan notified.

## 2023-09-27 NOTE — ED Notes (Signed)
 Pt given breakfast tray and eating in common area.

## 2023-09-27 NOTE — ED Notes (Signed)
Snack given to patient.  

## 2023-09-27 NOTE — ED Notes (Signed)
 Pt continues to yell loudly in room.

## 2023-09-27 NOTE — ED Notes (Signed)
 Patient to BHU 1 from main ED.  Patient oriented to unit regarding rounding and cameras.  Patient instructed to come to nsg station for any needs/concerns.

## 2023-09-27 NOTE — ED Notes (Signed)
 IVC/ pending [facility] psych consult

## 2023-09-27 NOTE — ED Notes (Signed)
 TTS in to speak with patient.

## 2023-09-27 NOTE — BH Assessment (Signed)
 Writer spoke to patient's caregiver, Cecelia at [facility] [phone]. She says patient is constantly screaming, yelling, throwing stuff and just will not calm down when he comes to the group home. She states although patient is compliant with his medications, he continues to be disruptive. She also mentioned patient is set to be removed from facility between today and Tuesday 09/29/23.

## 2023-09-27 NOTE — ED Notes (Signed)
 Pt advised he wishes to leave AMA, pt is vol.

## 2023-09-27 NOTE — ED Notes (Signed)
 Pt transferred to BHU 1 in W/C with NT Sage and security.  Secretary Armani notified.

## 2023-09-27 NOTE — ED Notes (Signed)
 ivc by MD Forbach/psych consult ordered/pending.

## 2023-09-27 NOTE — ED Notes (Signed)
Dinner tray and drink provided to pt.
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
## 2023-09-27 NOTE — ED Notes (Signed)
 Pt is in room, yelling, banging on the walls and screaming.

## 2023-09-27 NOTE — ED Notes (Signed)
Pt given socks  

## 2023-09-27 NOTE — ED Provider Notes (Signed)
 [facility] Provider Note    Event Date/Time   First MD Initiated Contact with Patient 09/26/23 2315     (approximate)   History   Psychiatric Evaluation  Level 5 caveat:  history/ROS limited by active psychosis / mental illness / altered mental status   HPI [name] is a 63 y.o. male well-known to the emergency department for psychiatric issues including schizoaffective disorder and medication noncompliance.  He was just seen here recently and cleared by psychiatry to return to his group home.  He presents tonight for evaluation of bizarre behavior and rapid disorganized speech.  He reportedly had an argument with his roommate and was saying very bizarre things.  This seems to be typical for him but he apparently was worse than usual.  He is in good spirits with me.  He is unable to continue with a single line of dialogue but is not immediately representing a danger to himself or others.  His conversation jumps from topic to topic but he does not clearly meet involuntary commitment at this time.  See below for additional details.  He has no medical complaints or concerns.      Physical Exam   Triage Vital Signs: ED Triage Vitals [09/26/23 2302]  Encounter Vitals Group     BP (!) 150/86     Girls Systolic BP Percentile      Girls Diastolic BP Percentile      Boys Systolic BP Percentile      Boys Diastolic BP Percentile      Pulse Rate (!) 111     Resp 16     Temp 99 F (37.2 C)     Temp Source Oral     SpO2 96 %     Weight 65 kg (143 lb 4.8 oz)     Height 1.702 m (5' 7)     Head Circumference      Peak Flow      Pain Score 0     Pain Loc      Pain Education      Exclude from Growth Chart     Most recent vital signs: Vitals:   09/26/23 2302  BP: (!) 150/86  Pulse: (!) 111  Resp: 16  Temp: 99 F (37.2 C)  SpO2: 96%    General: Awake, no obvious distress. CV:  Good peripheral perfusion.  Regular rate and  rhythm. Resp:  Normal effort. Speaking easily and comfortably, no accessory muscle usage nor intercostal retractions.   Abd:  No distention.  Other:  Patient's speech is disorganized and tangential.  He has flight of ideas and is difficult to appreciate if he is hallucinating.  He is not responding to internal stimuli and he maintains eye contact during conversation.  Does not clearly meet involuntary commitment criteria at this time.   ED Results / Procedures / Treatments   Labs (all labs ordered are listed, but only abnormal results are displayed) Labs Reviewed  COMPREHENSIVE METABOLIC PANEL WITH GFR - Abnormal; Notable for the following components:      Result Value   Sodium 146 (*)    Glucose, Bld 111 (*)    All other components within normal limits  CBC - Abnormal; Notable for the following components:   RBC 3.60 (*)    Hemoglobin 11.6 (*)    HCT 37.3 (*)    MCV 103.6 (*)    Platelets 402 (*)    All other components within normal limits  URINE DRUG SCREEN, QUALITATIVE ([facility] ONLY) - Abnormal; Notable for the following components:   Tricyclic, Ur Screen POSITIVE (*)    All other components within normal limits  ETHANOL      PROCEDURES:  Critical Care performed: Yes, see critical care procedure note(s)  .Critical Care  Performed by: Gordan Huxley, MD Authorized by: Gordan Huxley, MD   Critical care provider statement:    Critical care time (minutes):  30   Critical care time was exclusive of:  Separately billable procedures and treating other patients   Critical care was necessary to treat or prevent imminent or life-threatening deterioration of the following conditions: psychiatric crisis.   Critical care was time spent personally by me on the following activities:  Development of treatment plan with patient or surrogate, evaluation of patient's response to treatment, examination of patient, obtaining history from patient or surrogate, ordering and performing treatments  and interventions, ordering and review of laboratory studies, ordering and review of radiographic studies, pulse oximetry, re-evaluation of patient's condition and review of old charts     IMPRESSION / MDM / ASSESSMENT AND PLAN / ED COURSE  I reviewed the triage vital signs and the nursing notes.                              Differential diagnosis includes, but is not limited to, schizoaffective disorder, mood disorder, adjustment disorder, substance use.  Patient's presentation is most consistent with acute presentation with potential threat to life or bodily function.  Labs/studies ordered: As per protocol, I ordered the following labs as part of the patient's medical and psychiatric evaluation:  CBC, CMP, ethanol level, urine drug screen.  Interventions/Medications given:  Medications  ziprasidone  (GEODON ) injection 20 mg (20 mg Intramuscular Given 09/27/23 0221)    (Note:  hospital course my include additional interventions and/or labs/studies not listed above.)   Patient's labs are reassuring, comprehensive metabolic panel is only slightly elevated and does not account for his behavior.  I reviewed prior medical records including the most recent psychiatric note written by Zelda Sharps, psych NP, which mentions his disorganized speech and bizarre behavior.  He seems to be at baseline.  We will watch him for some time tonight.  It is unlikely the group home will take him back but he may benefit from psychiatric consult but at this time he may just need a break from the group home.  I will reassess.  The patient has been placed in psychiatric observation due to the need to provide a safe environment for the patient while obtaining psychiatric consultation and evaluation, as well as ongoing medical and medication management to treat the patient's condition.  The patient has not been placed under full IVC at this time.    Clinical Course as of 09/27/23 0531  [place] Sep 27, 2023  0214  The patient is now screaming and slamming doors because we cannot get a hold of the group home.  I have ordered psych consult but he likely will require medication administration if he continues to behave like this.  He does not appear to be safe to leave and this state [CF]  0216 Now that the patient is aggressive and representing a danger to himself and others, he is not safe to be discharged and requires involuntary commitment. [CF]    Clinical Course User Index [CF] Gordan Huxley, MD     FINAL CLINICAL IMPRESSION(S) / ED DIAGNOSES   Final  diagnoses:  Schizoaffective disorder, unspecified type (HCC)  Aggressive behavior     Rx / DC Orders   ED Discharge Orders     None        Note:  This document was prepared using Dragon voice recognition software and may include unintentional dictation errors.   Gordan Huxley, MD 09/27/23 [phone]

## 2023-09-27 NOTE — BH Assessment (Signed)
 Writer attempted to speak with [name] (Legal Guardian) [phone]. A HIPAA compliant voicemail was left requesting a call back.

## 2023-09-27 NOTE — ED Notes (Signed)
 Patient up to nsg station door, speaking loudly stating he wants to go to a hospital in [place], [place].  Patient encouraged to go back to room multiple times, then hit door with fist.  After speaking again about going to another facility patient then back to room.

## 2023-09-27 NOTE — ED Notes (Signed)
 Pt came to the door and started yelling and banging on the door cussing. Pt escorted back to room by security.

## 2023-09-27 NOTE — Progress Notes (Signed)
 Full consult note to follow: Patient is 63 year old male diagnosed with schizoaffective disorder. His current presentation of aggressive behaviors, irritability, mood lability, disorganized thought process and delusions correlate with acute exacerbation of patient's chronic schizoaffective disorder. Patient continues to have outbursts and behaviors requiring redirection in the emergency room, at this time, we will recommend inpatient admission for further stabilization.

## 2023-09-27 NOTE — ED Notes (Signed)
Moved to BHU °

## 2023-09-27 NOTE — BH Assessment (Signed)
Adult/GERO MH ° °Referral information for Psychiatric Hospitalization faxed to: ° °· [facility] ([place]-[phone]---[phone]---[phone]) ° °· [facility] [phone] -or- [phone], [phone]) ° °· [facility] ([phone] or [phone]), ° °. [facility] ([phone] -or- [phone]) °
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
## 2023-09-27 NOTE — ED Notes (Signed)
 Pt stated that he would like to leave and go back to his group home.  EDP Gordan notified.

## 2023-09-27 NOTE — ED Notes (Signed)
 Patient to [facility] 1 frp,

## 2023-09-27 NOTE — ED Notes (Signed)
 Patient up to window at nsg station talking to Engineer, materials.

## 2023-09-27 NOTE — ED Provider Notes (Signed)
 Emergency Medicine Observation Re-evaluation Note  BARNIE SOPKO is a 63 y.o. male, seen on rounds today.  Pt initially presented to the ED for complaints of Psychiatric Evaluation Currently, the patient is resting  Physical Exam  BP (!) 180/110   Pulse 81   Temp 98.8 F (37.1 C)   Resp 17   Ht 5' 7 (1.702 m)   Wt 65 kg   SpO2 94%   BMI 22.44 kg/m  Physical Exam General: NAD Cardiac: Well perfused Lungs: No distress   ED Course / MDM  EKG:   I have reviewed the labs performed to date as well as medications administered while in observation.  Recent changes in the last 24 hours include I have reordered the patient's home medications  Plan  Current plan is for plan per TOC/psych    Nicholaus Rolland BRAVO, MD 09/27/23 1013

## 2023-09-27 NOTE — ED Notes (Signed)
 Pt provided lunch and drink

## 2023-09-27 NOTE — ED Notes (Signed)
 Patient is IVC pending inpatient admit

## 2023-09-27 NOTE — ED Notes (Signed)
 While doing 15 min round, pt was in the room naked, light is off, looking for his pants. Advised pt to put the scrubs back on. Pt put the scrubs back on.

## 2023-09-27 NOTE — ED Notes (Signed)
 Patient awake and walking around unit.

## 2023-09-27 NOTE — ED Notes (Signed)
 Pt talkative and restless at this time, calmer than previous. Safety sandwich tray and sprite given per pt request. Pt continues speaking with security guard in room with disorganized and appropriate conversation.

## 2023-09-27 NOTE — ED Notes (Signed)
 Pt continues to talking to security in room at this time, calmer.

## 2023-09-28 DIAGNOSIS — R456 Violent behavior: Secondary | ICD-10-CM | POA: Diagnosis not present

## 2023-09-28 MED ORDER — CHLORPROMAZINE HCL 50 MG PO TABS
50.0000 mg | ORAL_TABLET | Freq: Four times a day (QID) | ORAL | Status: DC
  Administered 2023-09-28 – 2023-09-29 (×4): 50 mg via ORAL
  Filled 2023-09-28 (×4): qty 1

## 2023-09-28 MED ORDER — ZIPRASIDONE MESYLATE 20 MG IM SOLR
20.0000 mg | Freq: Once | INTRAMUSCULAR | Status: AC
  Administered 2023-09-28: 20 mg via INTRAMUSCULAR
  Filled 2023-09-28 (×2): qty 20

## 2023-09-28 NOTE — ED Notes (Signed)
 Pt sat down on bed and took IM shot willingly. RN instructed pt to use coping skills to calm down and to rest. Offered snack, declined.

## 2023-09-28 NOTE — ED Notes (Signed)
 Patient provided snack at appropriate snack time.  Pt consumed 100% of snack provided, tolerated well w/o complaints   Trash disposted of appropriately by patient.

## 2023-09-28 NOTE — ED Notes (Signed)
Snack provided for pt

## 2023-09-28 NOTE — ED Notes (Signed)
 IVC GOING TO  [facility]  ON 09/29/23

## 2023-09-28 NOTE — ED Notes (Addendum)
 Pt room camera not functioning correctly. Pt asked to move rooms. Pt appears agitated, loud and disruptive, throwing TV remote when asked to move rooms.

## 2023-09-28 NOTE — Progress Notes (Signed)
 Per TTS, patient has been referred to the following facilities:   Service Provider Phone  [facility]  [phone]  [facility]  [phone]  [facility]  [phone]  [facility]  [phone]  [facility]  [phone]  [facility]  [phone]  [facility]  [phone]  [facility]  [phone]  [facility]  [phone]  [facility]  [phone]  [facility]  [phone]  [facility]  [phone]  [facility]  [phone]  [facility]  [phone]  [facility]  [phone]  [facility]  [phone]  [facility]  [phone]  [facility]  [phone], [place] [phone]

## 2023-09-28 NOTE — ED Notes (Signed)
 [facility] contacted patient's legal guardian  [name] of [employer] [phone]).  Mr. Novak was unavailable. Guardian Cassandra Massenburg of [employer] was updated on pt's acceptance to [facility] for 09/29/23.  Cassandra stated pt called her several times earlier today.  [place] will update Mr. Novak.  Zena, [facility] [phone]

## 2023-09-28 NOTE — ED Notes (Signed)
Pt requested shower; provided clean hospital clothing and linens.  Shower setup provided with soap, shampoo, toothbrush/toothpaste, and deodorant.  Pt able to preform own ADL's with no assistance.    

## 2023-09-28 NOTE — ED Notes (Signed)
 Dinner meal provided to pt.

## 2023-09-28 NOTE — ED Provider Notes (Signed)
 Emergency Medicine Observation Re-evaluation Note  ELMOR KOST is a 63 y.o. male, seen on rounds today.  Pt initially presented to the ED for complaints of Psychiatric Evaluation  Currently, the patient is no acute distress. Sitting up in bed and calm now. Overnight did have an episode of talking to no one in the room around 5AM  Physical Exam  Blood pressure 136/74, pulse 75, temperature 98.6 F (37 C), temperature source Oral, resp. rate 18, height 5' 7 (1.702 m), weight 65 kg, SpO2 95%.  Physical Exam General: No apparent distress Pulm: Normal WOB Psych: calm     ED Course / MDM   I have reviewed the labs performed to date as well as medications administered while in observation.  Recent changes in the last 24 hours include none   Plan   Current plan is to continue to wait for psych placement Patient is under full IVC at this time.   Ernest Ronal BRAVO, MD 09/28/23 [phone]

## 2023-09-28 NOTE — ED Notes (Signed)
 Pt used phone at designated time. PT called caregiver and instructed them that he was being discharged. RN informed pt of most current plan that does not recommend discharge at this time. Pt restlessness and agitation increasing at this time.

## 2023-09-28 NOTE — ED Notes (Signed)
 Pt banging on doors and windows, demanding to go to [employer] [facility] [employer] or [place] as soon as possible. Pt also under the impression that he is being discharged today, despite RN informing him of his most current care plan. Unable to reorient pt to current reality. Pt telling me that his name is not Jarius and he is just acting for [place] right now. EDP M Funke informed of increasing agitation.

## 2023-09-28 NOTE — Progress Notes (Signed)
 Patient has been accepted to [facility] for 09/29/23. Patient was assigned to [address]. Accepting physician is Dr. Oneil Charleston. Call report to [phone]. Representative was [employer].   ER Staff is aware of it: Olam, ER Secretary Dr. Jossie, ER MD Amy RN, Patient's Nurse  Address:  [address]                 [place], [place] [address]

## 2023-09-28 NOTE — ED Notes (Signed)
 ivc/recommended for inpatient admission for further stabilization.

## 2023-09-28 NOTE — ED Notes (Signed)
 Hospital meal provided.  100% consumed, pt tolerated w/o complaints.  Waste discarded appropriately.

## 2023-09-28 NOTE — ED Notes (Signed)
 Continues to be agitated, loud and disruptive. Moved to [facility] 8

## 2023-09-28 NOTE — ED Notes (Signed)
 Patient laying in bed yelling out.  No one in room with patient.

## 2023-09-28 NOTE — ED Notes (Signed)
 Pt with loud, rapid and unintelligible words, banging on doors.

## 2023-09-29 DIAGNOSIS — R456 Violent behavior: Secondary | ICD-10-CM | POA: Diagnosis not present

## 2023-09-29 NOTE — ED Notes (Signed)
[place]  [place]  [employer]  called  for transport  to  [facility]

## 2023-09-29 NOTE — ED Notes (Signed)
 EMTALA reviewed by this RN.

## 2023-09-29 NOTE — ED Provider Notes (Signed)
 Emergency Medicine Observation Re-evaluation Note  [name] is a 63 y.o. male, seen on rounds today.  Pt initially presented to the ED for complaints of Psychiatric Evaluation  Currently, the patient is resting in bed. No reported issues from nursing team.   Physical Exam  BP 138/73 (BP Location: Left Arm)   Pulse 75   Temp 98 F (36.7 C) (Oral)   Resp 18   Ht 5' 7 (1.702 m)   Wt 65 kg   SpO2 100%   BMI 22.44 kg/m  Physical Exam General: Resting in bed  ED Course / MDM   No labs last 24 hours.  Plan  Current plan is for dispo per psychiatry. Current plan for inpatient placement. Under IVC.      Levander Slate, MD 09/29/23 [phone]

## 2023-09-29 NOTE — ED Notes (Signed)
 Pt given new scrubs, towels, soap, toothbrush and toothpaste this morning for shower.
